# Patient Record
Sex: Female | Born: 1940 | Race: White | Hispanic: No | Smoking: Former smoker
Health system: Southern US, Community
[De-identification: ages and names within clinical notes are randomized; demographics above are authoritative.]

## PROBLEM LIST (undated history)

## (undated) DIAGNOSIS — N390 Urinary tract infection, site not specified: Secondary | ICD-10-CM

## (undated) DIAGNOSIS — C801 Malignant (primary) neoplasm, unspecified: Secondary | ICD-10-CM

## (undated) DIAGNOSIS — E785 Hyperlipidemia, unspecified: Secondary | ICD-10-CM

## (undated) DIAGNOSIS — N3281 Overactive bladder: Secondary | ICD-10-CM

## (undated) DIAGNOSIS — E559 Vitamin D deficiency, unspecified: Secondary | ICD-10-CM

## (undated) DIAGNOSIS — C44311 Basal cell carcinoma of skin of nose: Secondary | ICD-10-CM

## (undated) DIAGNOSIS — I700 Atherosclerosis of aorta: Secondary | ICD-10-CM

## (undated) DIAGNOSIS — G610 Guillain-Barre syndrome: Secondary | ICD-10-CM

## (undated) DIAGNOSIS — M1990 Unspecified osteoarthritis, unspecified site: Secondary | ICD-10-CM

## (undated) DIAGNOSIS — I890 Lymphedema, not elsewhere classified: Secondary | ICD-10-CM

## (undated) DIAGNOSIS — E079 Disorder of thyroid, unspecified: Secondary | ICD-10-CM

## (undated) HISTORY — PX: REPLACEMENT TOTAL HIP W/  RESURFACING IMPLANTS: SUR1222

## (undated) HISTORY — DX: Hyperlipidemia, unspecified: E78.5

## (undated) HISTORY — DX: Unspecified osteoarthritis, unspecified site: M19.90

## (undated) HISTORY — DX: Overactive bladder: N32.81

## (undated) HISTORY — DX: Urinary tract infection, site not specified: N39.0

## (undated) HISTORY — DX: Vitamin D deficiency, unspecified: E55.9

## (undated) HISTORY — DX: Malignant (primary) neoplasm, unspecified: C80.1

## (undated) HISTORY — DX: Basal cell carcinoma of skin of nose: C44.311

## (undated) HISTORY — DX: Disorder of thyroid, unspecified: E07.9

## (undated) HISTORY — DX: Lymphedema, not elsewhere classified: I89.0

## (undated) HISTORY — DX: Guillain-Barre syndrome: G61.0

## (undated) HISTORY — PX: ABDOMINAL HYSTERECTOMY: SHX81

---

## 2006-08-28 HISTORY — PX: CHOLECYSTECTOMY: SHX55

## 2016-09-12 DIAGNOSIS — R918 Other nonspecific abnormal finding of lung field: Secondary | ICD-10-CM | POA: Diagnosis not present

## 2016-09-12 DIAGNOSIS — J984 Other disorders of lung: Secondary | ICD-10-CM | POA: Diagnosis not present

## 2016-09-12 DIAGNOSIS — M19011 Primary osteoarthritis, right shoulder: Secondary | ICD-10-CM | POA: Diagnosis not present

## 2016-09-26 DIAGNOSIS — E039 Hypothyroidism, unspecified: Secondary | ICD-10-CM | POA: Diagnosis not present

## 2016-09-26 DIAGNOSIS — Z0000 Encounter for general adult medical examination without abnormal findings: Secondary | ICD-10-CM | POA: Diagnosis not present

## 2016-09-27 DIAGNOSIS — M7989 Other specified soft tissue disorders: Secondary | ICD-10-CM | POA: Diagnosis not present

## 2016-09-27 DIAGNOSIS — M79605 Pain in left leg: Secondary | ICD-10-CM | POA: Diagnosis not present

## 2016-09-27 DIAGNOSIS — M799 Soft tissue disorder, unspecified: Secondary | ICD-10-CM | POA: Diagnosis not present

## 2016-09-27 DIAGNOSIS — R936 Abnormal findings on diagnostic imaging of limbs: Secondary | ICD-10-CM | POA: Diagnosis not present

## 2016-09-27 DIAGNOSIS — E039 Hypothyroidism, unspecified: Secondary | ICD-10-CM | POA: Diagnosis not present

## 2016-09-27 DIAGNOSIS — I8290 Acute embolism and thrombosis of unspecified vein: Secondary | ICD-10-CM | POA: Diagnosis not present

## 2016-11-10 DIAGNOSIS — E039 Hypothyroidism, unspecified: Secondary | ICD-10-CM | POA: Diagnosis not present

## 2016-11-10 DIAGNOSIS — Z79899 Other long term (current) drug therapy: Secondary | ICD-10-CM | POA: Diagnosis not present

## 2016-11-10 DIAGNOSIS — E785 Hyperlipidemia, unspecified: Secondary | ICD-10-CM | POA: Diagnosis not present

## 2016-11-22 DIAGNOSIS — D2372 Other benign neoplasm of skin of left lower limb, including hip: Secondary | ICD-10-CM | POA: Diagnosis not present

## 2016-11-27 DIAGNOSIS — Z79899 Other long term (current) drug therapy: Secondary | ICD-10-CM | POA: Diagnosis not present

## 2016-11-27 DIAGNOSIS — E785 Hyperlipidemia, unspecified: Secondary | ICD-10-CM | POA: Diagnosis not present

## 2016-11-27 DIAGNOSIS — Z0000 Encounter for general adult medical examination without abnormal findings: Secondary | ICD-10-CM | POA: Diagnosis not present

## 2016-11-27 DIAGNOSIS — E039 Hypothyroidism, unspecified: Secondary | ICD-10-CM | POA: Diagnosis not present

## 2016-12-14 DIAGNOSIS — Z1231 Encounter for screening mammogram for malignant neoplasm of breast: Secondary | ICD-10-CM | POA: Diagnosis not present

## 2016-12-19 DIAGNOSIS — N6001 Solitary cyst of right breast: Secondary | ICD-10-CM | POA: Diagnosis not present

## 2016-12-19 DIAGNOSIS — N6459 Other signs and symptoms in breast: Secondary | ICD-10-CM | POA: Diagnosis not present

## 2016-12-19 DIAGNOSIS — Z1231 Encounter for screening mammogram for malignant neoplasm of breast: Secondary | ICD-10-CM | POA: Diagnosis not present

## 2016-12-19 DIAGNOSIS — R928 Other abnormal and inconclusive findings on diagnostic imaging of breast: Secondary | ICD-10-CM | POA: Diagnosis not present

## 2016-12-20 DIAGNOSIS — Z124 Encounter for screening for malignant neoplasm of cervix: Secondary | ICD-10-CM | POA: Diagnosis not present

## 2016-12-20 DIAGNOSIS — N3281 Overactive bladder: Secondary | ICD-10-CM | POA: Diagnosis not present

## 2017-05-28 DIAGNOSIS — N6001 Solitary cyst of right breast: Secondary | ICD-10-CM | POA: Diagnosis not present

## 2017-05-28 DIAGNOSIS — N6011 Diffuse cystic mastopathy of right breast: Secondary | ICD-10-CM | POA: Diagnosis not present

## 2017-05-28 DIAGNOSIS — R928 Other abnormal and inconclusive findings on diagnostic imaging of breast: Secondary | ICD-10-CM | POA: Diagnosis not present

## 2017-06-11 DIAGNOSIS — E039 Hypothyroidism, unspecified: Secondary | ICD-10-CM | POA: Diagnosis not present

## 2017-06-11 DIAGNOSIS — Z79899 Other long term (current) drug therapy: Secondary | ICD-10-CM | POA: Diagnosis not present

## 2017-07-23 DIAGNOSIS — M2042 Other hammer toe(s) (acquired), left foot: Secondary | ICD-10-CM | POA: Diagnosis not present

## 2017-07-31 DIAGNOSIS — E785 Hyperlipidemia, unspecified: Secondary | ICD-10-CM | POA: Diagnosis not present

## 2017-07-31 DIAGNOSIS — E039 Hypothyroidism, unspecified: Secondary | ICD-10-CM | POA: Diagnosis not present

## 2017-08-06 DIAGNOSIS — E785 Hyperlipidemia, unspecified: Secondary | ICD-10-CM | POA: Diagnosis not present

## 2017-08-06 DIAGNOSIS — E039 Hypothyroidism, unspecified: Secondary | ICD-10-CM | POA: Diagnosis not present

## 2017-08-28 DIAGNOSIS — C801 Malignant (primary) neoplasm, unspecified: Secondary | ICD-10-CM

## 2017-08-28 HISTORY — DX: Malignant (primary) neoplasm, unspecified: C80.1

## 2017-09-05 DIAGNOSIS — Z780 Asymptomatic menopausal state: Secondary | ICD-10-CM | POA: Diagnosis not present

## 2017-10-31 DIAGNOSIS — R202 Paresthesia of skin: Secondary | ICD-10-CM | POA: Diagnosis not present

## 2017-10-31 DIAGNOSIS — R531 Weakness: Secondary | ICD-10-CM | POA: Diagnosis not present

## 2017-11-09 DIAGNOSIS — M2042 Other hammer toe(s) (acquired), left foot: Secondary | ICD-10-CM | POA: Diagnosis not present

## 2017-11-12 DIAGNOSIS — R531 Weakness: Secondary | ICD-10-CM | POA: Diagnosis not present

## 2017-11-12 DIAGNOSIS — M47816 Spondylosis without myelopathy or radiculopathy, lumbar region: Secondary | ICD-10-CM | POA: Diagnosis not present

## 2017-11-12 DIAGNOSIS — R202 Paresthesia of skin: Secondary | ICD-10-CM | POA: Diagnosis not present

## 2017-11-12 DIAGNOSIS — R1900 Intra-abdominal and pelvic swelling, mass and lump, unspecified site: Secondary | ICD-10-CM | POA: Diagnosis not present

## 2017-11-12 DIAGNOSIS — M4316 Spondylolisthesis, lumbar region: Secondary | ICD-10-CM | POA: Diagnosis not present

## 2017-11-12 DIAGNOSIS — M4696 Unspecified inflammatory spondylopathy, lumbar region: Secondary | ICD-10-CM | POA: Diagnosis not present

## 2017-11-24 DIAGNOSIS — Z79899 Other long term (current) drug therapy: Secondary | ICD-10-CM | POA: Diagnosis not present

## 2017-11-24 DIAGNOSIS — E039 Hypothyroidism, unspecified: Secondary | ICD-10-CM | POA: Diagnosis not present

## 2017-12-12 DIAGNOSIS — R202 Paresthesia of skin: Secondary | ICD-10-CM | POA: Diagnosis not present

## 2017-12-12 DIAGNOSIS — R1900 Intra-abdominal and pelvic swelling, mass and lump, unspecified site: Secondary | ICD-10-CM | POA: Diagnosis not present

## 2017-12-20 DIAGNOSIS — R9389 Abnormal findings on diagnostic imaging of other specified body structures: Secondary | ICD-10-CM | POA: Diagnosis not present

## 2017-12-20 DIAGNOSIS — R1900 Intra-abdominal and pelvic swelling, mass and lump, unspecified site: Secondary | ICD-10-CM | POA: Diagnosis not present

## 2017-12-20 DIAGNOSIS — N854 Malposition of uterus: Secondary | ICD-10-CM | POA: Diagnosis not present

## 2017-12-24 DIAGNOSIS — M2042 Other hammer toe(s) (acquired), left foot: Secondary | ICD-10-CM | POA: Diagnosis not present

## 2017-12-26 DIAGNOSIS — Z1151 Encounter for screening for human papillomavirus (HPV): Secondary | ICD-10-CM | POA: Diagnosis not present

## 2017-12-26 DIAGNOSIS — N83209 Unspecified ovarian cyst, unspecified side: Secondary | ICD-10-CM | POA: Diagnosis not present

## 2017-12-26 DIAGNOSIS — N83201 Unspecified ovarian cyst, right side: Secondary | ICD-10-CM | POA: Diagnosis not present

## 2017-12-26 DIAGNOSIS — N840 Polyp of corpus uteri: Secondary | ICD-10-CM | POA: Diagnosis not present

## 2017-12-26 DIAGNOSIS — N8500 Endometrial hyperplasia, unspecified: Secondary | ICD-10-CM | POA: Diagnosis not present

## 2017-12-26 DIAGNOSIS — Z124 Encounter for screening for malignant neoplasm of cervix: Secondary | ICD-10-CM | POA: Diagnosis not present

## 2018-01-02 DIAGNOSIS — N6011 Diffuse cystic mastopathy of right breast: Secondary | ICD-10-CM | POA: Diagnosis not present

## 2018-01-02 DIAGNOSIS — N6001 Solitary cyst of right breast: Secondary | ICD-10-CM | POA: Diagnosis not present

## 2018-01-02 DIAGNOSIS — Z1231 Encounter for screening mammogram for malignant neoplasm of breast: Secondary | ICD-10-CM | POA: Diagnosis not present

## 2018-02-05 DIAGNOSIS — R9389 Abnormal findings on diagnostic imaging of other specified body structures: Secondary | ICD-10-CM | POA: Diagnosis not present

## 2018-02-05 DIAGNOSIS — N949 Unspecified condition associated with female genital organs and menstrual cycle: Secondary | ICD-10-CM | POA: Diagnosis not present

## 2018-02-05 DIAGNOSIS — N281 Cyst of kidney, acquired: Secondary | ICD-10-CM | POA: Diagnosis not present

## 2018-02-07 DIAGNOSIS — R0982 Postnasal drip: Secondary | ICD-10-CM | POA: Diagnosis not present

## 2018-02-07 DIAGNOSIS — J029 Acute pharyngitis, unspecified: Secondary | ICD-10-CM | POA: Diagnosis not present

## 2018-02-09 DIAGNOSIS — N949 Unspecified condition associated with female genital organs and menstrual cycle: Secondary | ICD-10-CM | POA: Diagnosis not present

## 2018-02-09 DIAGNOSIS — D270 Benign neoplasm of right ovary: Secondary | ICD-10-CM | POA: Diagnosis not present

## 2018-02-09 DIAGNOSIS — R9389 Abnormal findings on diagnostic imaging of other specified body structures: Secondary | ICD-10-CM | POA: Diagnosis not present

## 2018-02-09 DIAGNOSIS — Z9049 Acquired absence of other specified parts of digestive tract: Secondary | ICD-10-CM | POA: Diagnosis not present

## 2018-02-09 DIAGNOSIS — N281 Cyst of kidney, acquired: Secondary | ICD-10-CM | POA: Diagnosis not present

## 2018-02-09 DIAGNOSIS — K30 Functional dyspepsia: Secondary | ICD-10-CM | POA: Diagnosis not present

## 2018-02-12 DIAGNOSIS — R9389 Abnormal findings on diagnostic imaging of other specified body structures: Secondary | ICD-10-CM | POA: Diagnosis not present

## 2018-02-12 DIAGNOSIS — R1900 Intra-abdominal and pelvic swelling, mass and lump, unspecified site: Secondary | ICD-10-CM | POA: Diagnosis not present

## 2018-02-13 DIAGNOSIS — R1900 Intra-abdominal and pelvic swelling, mass and lump, unspecified site: Secondary | ICD-10-CM | POA: Diagnosis not present

## 2018-02-13 DIAGNOSIS — E0500 Thyrotoxicosis with diffuse goiter without thyrotoxic crisis or storm: Secondary | ICD-10-CM | POA: Diagnosis not present

## 2018-02-13 DIAGNOSIS — R9389 Abnormal findings on diagnostic imaging of other specified body structures: Secondary | ICD-10-CM | POA: Diagnosis not present

## 2018-02-15 DIAGNOSIS — C7962 Secondary malignant neoplasm of left ovary: Secondary | ICD-10-CM | POA: Diagnosis not present

## 2018-02-15 DIAGNOSIS — E0500 Thyrotoxicosis with diffuse goiter without thyrotoxic crisis or storm: Secondary | ICD-10-CM | POA: Diagnosis not present

## 2018-02-15 DIAGNOSIS — C5702 Malignant neoplasm of left fallopian tube: Secondary | ICD-10-CM | POA: Diagnosis not present

## 2018-02-15 DIAGNOSIS — D251 Intramural leiomyoma of uterus: Secondary | ICD-10-CM | POA: Diagnosis not present

## 2018-02-15 DIAGNOSIS — N840 Polyp of corpus uteri: Secondary | ICD-10-CM | POA: Diagnosis not present

## 2018-02-15 DIAGNOSIS — N800 Endometriosis of uterus: Secondary | ICD-10-CM | POA: Diagnosis not present

## 2018-02-15 DIAGNOSIS — K660 Peritoneal adhesions (postprocedural) (postinfection): Secondary | ICD-10-CM | POA: Diagnosis not present

## 2018-02-15 DIAGNOSIS — C562 Malignant neoplasm of left ovary: Secondary | ICD-10-CM | POA: Diagnosis not present

## 2018-02-15 DIAGNOSIS — C7961 Secondary malignant neoplasm of right ovary: Secondary | ICD-10-CM | POA: Diagnosis not present

## 2018-02-15 DIAGNOSIS — C561 Malignant neoplasm of right ovary: Secondary | ICD-10-CM | POA: Diagnosis not present

## 2018-02-15 DIAGNOSIS — E785 Hyperlipidemia, unspecified: Secondary | ICD-10-CM | POA: Diagnosis not present

## 2018-02-15 HISTORY — PX: TOTAL ABDOMINAL HYSTERECTOMY W/ BILATERAL SALPINGOOPHORECTOMY: SHX83

## 2018-02-15 HISTORY — PX: LAPAROSCOPIC VAGINAL HYSTERECTOMY WITH SALPINGO OOPHORECTOMY: SHX6681

## 2018-02-16 DIAGNOSIS — E0500 Thyrotoxicosis with diffuse goiter without thyrotoxic crisis or storm: Secondary | ICD-10-CM | POA: Diagnosis not present

## 2018-02-16 DIAGNOSIS — K660 Peritoneal adhesions (postprocedural) (postinfection): Secondary | ICD-10-CM | POA: Diagnosis not present

## 2018-02-16 DIAGNOSIS — N840 Polyp of corpus uteri: Secondary | ICD-10-CM | POA: Diagnosis not present

## 2018-02-16 DIAGNOSIS — E785 Hyperlipidemia, unspecified: Secondary | ICD-10-CM | POA: Diagnosis not present

## 2018-02-22 ENCOUNTER — Ambulatory Visit (INDEPENDENT_AMBULATORY_CARE_PROVIDER_SITE_OTHER): Payer: Medicare Other | Admitting: General Surgery

## 2018-02-22 ENCOUNTER — Other Ambulatory Visit: Payer: Self-pay | Admitting: *Deleted

## 2018-02-22 ENCOUNTER — Encounter: Payer: Self-pay | Admitting: General Surgery

## 2018-02-22 VITALS — BP 110/66 | HR 78 | Temp 98.1°F | Resp 14 | Ht 66.0 in | Wt 180.0 lb

## 2018-02-22 DIAGNOSIS — C561 Malignant neoplasm of right ovary: Secondary | ICD-10-CM | POA: Diagnosis not present

## 2018-02-22 DIAGNOSIS — C569 Malignant neoplasm of unspecified ovary: Secondary | ICD-10-CM

## 2018-02-22 NOTE — Patient Instructions (Addendum)
Implanted Port Insertion Implanted port insertion is a procedure to put in a port and catheter. The port is a device with an injectable disk that can be accessed by your health care provider. The port is connected to a vein in the chest or neck by a small flexible tube (catheter). There are different types of ports. The implanted port may be used as a long-term IV access for:  Medicines, such as chemotherapy.  Fluids.  Liquid nutrition, such as total parenteral nutrition (TPN).  Blood samples.  Having a port means that your health care provider will not need to use the veins in your arms for these procedures. Tell a health care provider about:  Any allergies you have.  All medicines you are taking, especially blood thinners, as well as any vitamins, herbs, eye drops, creams, over-the-counter medicines, and steroids.  Any problems you or family members have had with anesthetic medicines.  Any blood disorders you have.  Any surgeries you have had.  Any medical conditions you have, including diabetes or kidney problems.  Whether you are pregnant or may be pregnant. What are the risks? Generally, this is a safe procedure. However, problems may occur, including:  Allergic reactions to medicines or dyes.  Damage to other structures or organs.  Infection.  Damage to the blood vessel, bruising, or bleeding at the puncture site.  Blood clot.  Breakdown of the skin over the port.  A collection of air in the chest that can cause one of the lungs to collapse (pneumothorax). This is rare.  What happens before the procedure? Staying hydrated Follow instructions from your health care provider about hydration, which may include:  Up to 2 hours before the procedure - you may continue to drink clear liquids, such as water, clear fruit juice, black coffee, and plain tea.  Eating and drinking restrictions  Follow instructions from your health care provider about eating and drinking,  which may include: ? 8 hours before the procedure - stop eating heavy meals or foods such as meat, fried foods, or fatty foods. ? 6 hours before the procedure - stop eating light meals or foods, such as toast or cereal. ? 6 hours before the procedure - stop drinking milk or drinks that contain milk. ? 2 hours before the procedure - stop drinking clear liquids. Medicines  Ask your health care provider about: ? Changing or stopping your regular medicines. This is especially important if you are taking diabetes medicines or blood thinners. ? Taking medicines such as aspirin and ibuprofen. These medicines can thin your blood. Do not take these medicines before your procedure if your health care provider instructs you not to.  You may be given antibiotic medicine to help prevent infection. General instructions  Plan to have someone take you home from the hospital or clinic.  If you will be going home right after the procedure, plan to have someone with you for 24 hours.  You may have blood tests.  You may be asked to shower with a germ-killing soap. What happens during the procedure?  To lower your risk of infection: ? Your health care team will wash or sanitize their hands. ? Your skin will be washed with soap. ? Hair may be removed from the surgical area.  An IV tube will be inserted into one of your veins.  You will be given one or more of the following: ? A medicine to help you relax (sedative). ? A medicine to numb the area (local anesthetic).    Two small cuts (incisions) will be made to insert the port. ? One incision will be made in your neck to get access to the vein where the catheter will lie. ? The other incision will be made in the upper chest. This is where the port will lie.  The procedure may be done using continuous X-ray (fluoroscopy) or other imaging tools for guidance.  The port and catheter will be placed. There may be a small, raised area where the port  is.  The port will be flushed with a salt solution (saline), and blood will be drawn to make sure that it is working correctly.  The incisions will be closed.  Bandages (dressings) may be placed over the incisions. The procedure may vary among health care providers and hospitals. What happens after the procedure?  Your blood pressure, heart rate, breathing rate, and blood oxygen level will be monitored until the medicines you were given have worn off.  Do not drive for 24 hours if you were given a sedative.  You will be given a manufacturer's information card for the type of port that you have. Keep this with you.  Your port will need to be flushed and checked as told by your health care provider, usually every few weeks.  A chest X-ray will be done to: ? Check the placement of the port. ? Make sure there is no injury to your lung. Summary  Implanted port insertion is a procedure to put in a port and catheter.  The implanted port is used as a long-term IV access.  The port will need to be flushed and checked as told by your health care provider, usually every few weeks.  Keep your manufacturer's information card with you at all times. This information is not intended to replace advice given to you by your health care provider. Make sure you discuss any questions you have with your health care provider. Document Released: 06/04/2013 Document Revised: 07/05/2016 Document Reviewed: 07/05/2016 Elsevier Interactive Patient Education  2017 [employer].   The patient is scheduled for surgery at [facility] on 02/25/18. She will pre admit at the hospital that morning and report to the registration desk at 6:30 am. The patient is aware of date, time, and instructions.

## 2018-02-22 NOTE — Progress Notes (Signed)
Patient ID: Melissa Anderson, female   DOB: Feb 12, 1941, 77 y.o.   MRN: [id number]  Chief Complaint  Patient presents with  . Other    port placement    HPI Melissa Anderson is a 77 y.o. female here to discuss having a port placement done. She is here today with her friend Immunologist. Her daughter is Dr Mike Gip at [facility].  HPI  Past Medical History:  Diagnosis Date  . Cancer [facility]) 2019   right sided ovarian cancer  . Hyperlipidemia   . Thyroid disease     Past Surgical History:  Procedure Laterality Date  . CHOLECYSTECTOMY  2008  . TOTAL ABDOMINAL HYSTERECTOMY W/ BILATERAL SALPINGOOPHORECTOMY     lymph nodes removed    History reviewed. No pertinent family history.  Social History Social History   Tobacco Use  . Smoking status: Former Smoker    Years: 25.00    Types: Cigarettes  . Smokeless tobacco: Never Used  Substance Use Topics  . Alcohol use: Never    Frequency: Never  . Drug use: Never    Allergies  Allergen Reactions  . Epinephrine Other (See Comments)    Funny feeling, heart racing     Current Outpatient Medications  Medication Sig Dispense Refill  . rosuvastatin (CRESTOR) 10 MG tablet Take 1 tablet by mouth daily.    Marland Kitchen SYNTHROID 175 MCG tablet Take 1 tablet by mouth daily.    . VESICARE 5 MG tablet Take 1 tablet by mouth daily.     No current facility-administered medications for this visit.     Review of Systems Review of Systems  Constitutional: Negative.   Respiratory: Negative.   Cardiovascular: Negative.     Blood pressure 110/66, pulse 78, temperature 98.1 F (36.7 C), resp. rate 14, height 5\' 6"  (1.676 m), weight 180 lb (81.6 kg).  Physical Exam Physical Exam  Constitutional: She appears well-developed and well-nourished.  Cardiovascular: Normal rate and regular rhythm.  Pulmonary/Chest: Effort normal and breath sounds normal.  Lymphadenopathy:    She has no cervical adenopathy.       Left: No supraclavicular adenopathy  present.    Data Reviewed February 16, 2018 CBC completed at [facility] showed a hemoglobin of 11.8 with an MCV of 97, white blood cell count of 13,000, platelet count of 243,000. Basic metabolic panel of the same date was normal.  Creatinine 0.8.  Estimated GFR 71.  Comprehensive metabolic panel February 05, 3353 was normal.  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    Plan    The risks associated with central venous access including arterial, pulmonary and venous injury were reviewed. The possible need for additional treatment if pulmonary injury occurs (chest tube placement) was discussed.    The patient is scheduled for surgery at [facility] on 02/25/18. She will pre admit at the hospital that morning and report to the registration desk at 6:30 am. The patient is aware of date, time, and instructions.  Documented by Lesly Rubenstein LPN  HPI, Physical Exam, Assessment and Plan have been scribed under the direction and in the presence of Robert Bellow, MD  Concepcion Living, LPN  I have completed the exam and reviewed the  above documentation for accuracy and completeness.  I agree with the above.  Haematologist has been used and any errors in dictation or transcription are unintentional.  Hervey Ard, M.D., F.A.C.S.  Concepcion Living M 02/22/2018, 12:11 PM

## 2018-02-25 ENCOUNTER — Ambulatory Visit: Payer: Medicare Other | Admitting: Anesthesiology

## 2018-02-25 ENCOUNTER — Ambulatory Visit: Payer: Medicare Other

## 2018-02-25 ENCOUNTER — Other Ambulatory Visit: Payer: Self-pay

## 2018-02-25 ENCOUNTER — Encounter: Payer: Self-pay | Admitting: Oncology

## 2018-02-25 ENCOUNTER — Ambulatory Visit
Admission: RE | Admit: 2018-02-25 | Discharge: 2018-02-25 | Disposition: A | Discharge status: Home / Self Care | Payer: Medicare Other | Source: Ambulatory Visit | Attending: General Surgery | Admitting: General Surgery

## 2018-02-25 ENCOUNTER — Inpatient Hospital Stay: Payer: Medicare Other | Attending: Oncology | Admitting: Oncology

## 2018-02-25 ENCOUNTER — Encounter: Payer: Self-pay | Admitting: *Deleted

## 2018-02-25 ENCOUNTER — Encounter
Admission: RE | Disposition: A | Discharge status: Home / Self Care | Payer: Self-pay | Source: Ambulatory Visit | Attending: General Surgery

## 2018-02-25 VITALS — BP 104/62 | HR 71 | Temp 95.9°F | Resp 18 | Ht 66.14 in | Wt 180.9 lb

## 2018-02-25 DIAGNOSIS — E785 Hyperlipidemia, unspecified: Secondary | ICD-10-CM | POA: Insufficient documentation

## 2018-02-25 DIAGNOSIS — Z90722 Acquired absence of ovaries, bilateral: Secondary | ICD-10-CM | POA: Insufficient documentation

## 2018-02-25 DIAGNOSIS — Z452 Encounter for adjustment and management of vascular access device: Secondary | ICD-10-CM | POA: Insufficient documentation

## 2018-02-25 DIAGNOSIS — E039 Hypothyroidism, unspecified: Secondary | ICD-10-CM | POA: Diagnosis not present

## 2018-02-25 DIAGNOSIS — Z5189 Encounter for other specified aftercare: Secondary | ICD-10-CM | POA: Insufficient documentation

## 2018-02-25 DIAGNOSIS — E0500 Thyrotoxicosis with diffuse goiter without thyrotoxic crisis or storm: Secondary | ICD-10-CM | POA: Diagnosis not present

## 2018-02-25 DIAGNOSIS — C5702 Malignant neoplasm of left fallopian tube: Secondary | ICD-10-CM | POA: Diagnosis not present

## 2018-02-25 DIAGNOSIS — C562 Malignant neoplasm of left ovary: Secondary | ICD-10-CM | POA: Insufficient documentation

## 2018-02-25 DIAGNOSIS — Z7189 Other specified counseling: Secondary | ICD-10-CM | POA: Insufficient documentation

## 2018-02-25 DIAGNOSIS — Z87891 Personal history of nicotine dependence: Secondary | ICD-10-CM | POA: Insufficient documentation

## 2018-02-25 DIAGNOSIS — Z79890 Hormone replacement therapy (postmenopausal): Secondary | ICD-10-CM | POA: Insufficient documentation

## 2018-02-25 DIAGNOSIS — C561 Malignant neoplasm of right ovary: Secondary | ICD-10-CM | POA: Diagnosis not present

## 2018-02-25 DIAGNOSIS — Z95828 Presence of other vascular implants and grafts: Secondary | ICD-10-CM

## 2018-02-25 DIAGNOSIS — C5700 Malignant neoplasm of unspecified fallopian tube: Secondary | ICD-10-CM | POA: Diagnosis not present

## 2018-02-25 DIAGNOSIS — Z5111 Encounter for antineoplastic chemotherapy: Secondary | ICD-10-CM | POA: Insufficient documentation

## 2018-02-25 DIAGNOSIS — Z90710 Acquired absence of both cervix and uterus: Secondary | ICD-10-CM | POA: Diagnosis not present

## 2018-02-25 HISTORY — PX: PORTACATH PLACEMENT: SHX2246

## 2018-02-25 SURGERY — INSERTION, TUNNELED CENTRAL VENOUS DEVICE, WITH PORT
Anesthesia: General | Site: Chest | Laterality: Left | Wound class: Clean

## 2018-02-25 MED ORDER — FENTANYL CITRATE (PF) 100 MCG/2ML IJ SOLN: 25.0000 ug | INTRAMUSCULAR | Status: DC | PRN

## 2018-02-25 MED ORDER — SODIUM CHLORIDE 0.9 % IJ SOLN
INTRAMUSCULAR | Status: DC | PRN
  Administered 2018-02-25: 30 mL via INTRAVENOUS

## 2018-02-25 MED ORDER — ACETAMINOPHEN 10 MG/ML IV SOLN
INTRAVENOUS | Status: AC
  Filled 2018-02-25: qty 100

## 2018-02-25 MED ORDER — ONDANSETRON HCL 4 MG/2ML IJ SOLN
INTRAMUSCULAR | Status: DC | PRN
  Administered 2018-02-25: 4 mg via INTRAVENOUS

## 2018-02-25 MED ORDER — LIDOCAINE-PRILOCAINE 2.5-2.5 % EX CREA: TOPICAL_CREAM | CUTANEOUS | 3 refills | Status: DC

## 2018-02-25 MED ORDER — LIDOCAINE HCL 1 % IJ SOLN
INTRAMUSCULAR | Status: DC | PRN
  Administered 2018-02-25: 10 mL

## 2018-02-25 MED ORDER — PROPOFOL 10 MG/ML IV BOLUS
INTRAVENOUS | Status: DC | PRN
  Administered 2018-02-25: 80 mg via INTRAVENOUS

## 2018-02-25 MED ORDER — PROCHLORPERAZINE MALEATE 10 MG PO TABS: 10.0000 mg | ORAL_TABLET | Freq: Four times a day (QID) | ORAL | 1 refills | Status: DC | PRN

## 2018-02-25 MED ORDER — LACTATED RINGERS IV SOLN
INTRAVENOUS | Status: DC
  Administered 2018-02-25: 08:00:00 via INTRAVENOUS

## 2018-02-25 MED ORDER — ONDANSETRON HCL 4 MG/2ML IJ SOLN: 4.0000 mg | Freq: Once | INTRAMUSCULAR | Status: DC | PRN

## 2018-02-25 MED ORDER — LIDOCAINE HCL (PF) 1 % IJ SOLN
INTRAMUSCULAR | Status: AC
  Filled 2018-02-25: qty 30

## 2018-02-25 MED ORDER — LACTATED RINGERS IV SOLN
INTRAVENOUS | Status: DC | PRN
  Administered 2018-02-25: 09:00:00 via INTRAVENOUS

## 2018-02-25 MED ORDER — DEXAMETHASONE SODIUM PHOSPHATE 10 MG/ML IJ SOLN
INTRAMUSCULAR | Status: DC | PRN
  Administered 2018-02-25: 10 mg via INTRAVENOUS

## 2018-02-25 MED ORDER — ACETAMINOPHEN 10 MG/ML IV SOLN
INTRAVENOUS | Status: DC | PRN
  Administered 2018-02-25: 1000 mg via INTRAVENOUS

## 2018-02-25 MED ORDER — MIDAZOLAM HCL 2 MG/2ML IJ SOLN
INTRAMUSCULAR | Status: DC | PRN
  Administered 2018-02-25: 2 mg via INTRAVENOUS

## 2018-02-25 MED ORDER — LIDOCAINE HCL (CARDIAC) PF 100 MG/5ML IV SOSY
PREFILLED_SYRINGE | INTRAVENOUS | Status: DC | PRN
  Administered 2018-02-25: 80 mg via INTRAVENOUS

## 2018-02-25 MED ORDER — LORAZEPAM 0.5 MG PO TABS: 0.5000 mg | ORAL_TABLET | Freq: Four times a day (QID) | ORAL | 0 refills | Status: DC | PRN

## 2018-02-25 MED ORDER — ONDANSETRON HCL 8 MG PO TABS: 8.0000 mg | ORAL_TABLET | Freq: Two times a day (BID) | ORAL | 1 refills | Status: DC | PRN

## 2018-02-25 MED ORDER — DEXAMETHASONE 4 MG PO TABS: 8.0000 mg | ORAL_TABLET | Freq: Every day | ORAL | 1 refills | Status: DC

## 2018-02-25 MED ORDER — FENTANYL CITRATE (PF) 100 MCG/2ML IJ SOLN
INTRAMUSCULAR | Status: DC | PRN
  Administered 2018-02-25 (×2): 50 ug via INTRAVENOUS

## 2018-02-25 MED ORDER — FENTANYL CITRATE (PF) 100 MCG/2ML IJ SOLN
INTRAMUSCULAR | Status: AC
  Filled 2018-02-25: qty 2

## 2018-02-25 MED ORDER — CEFAZOLIN SODIUM-DEXTROSE 2-4 GM/100ML-% IV SOLN
2.0000 g | INTRAVENOUS | Status: AC
  Administered 2018-02-25: 2 g via INTRAVENOUS

## 2018-02-25 MED ORDER — HYDROCODONE-ACETAMINOPHEN 5-325 MG PO TABS: 1.0000 | ORAL_TABLET | ORAL | 0 refills | Status: DC | PRN

## 2018-02-25 MED ORDER — SEVOFLURANE IN SOLN
RESPIRATORY_TRACT | Status: AC
  Filled 2018-02-25: qty 250

## 2018-02-25 MED ORDER — MIDAZOLAM HCL 2 MG/2ML IJ SOLN
INTRAMUSCULAR | Status: AC
  Filled 2018-02-25: qty 2

## 2018-02-25 MED ORDER — CEFAZOLIN SODIUM-DEXTROSE 2-4 GM/100ML-% IV SOLN
INTRAVENOUS | Status: AC
  Filled 2018-02-25: qty 100

## 2018-02-25 SURGICAL SUPPLY — 29 items
BLADE SURG 15 STRL SS SAFETY (BLADE) ×2 IMPLANT
CHLORAPREP W/TINT 26ML (MISCELLANEOUS) ×2 IMPLANT
COVER LIGHT HANDLE STERIS (MISCELLANEOUS) ×4 IMPLANT
DECANTER SPIKE VIAL GLASS SM (MISCELLANEOUS) ×4 IMPLANT
DRAPE C-ARM XRAY 36X54 (DRAPES) ×2 IMPLANT
DRAPE LAPAROTOMY TRNSV 106X77 (MISCELLANEOUS) ×2 IMPLANT
DRSG TEGADERM 2-3/8X2-3/4 SM (GAUZE/BANDAGES/DRESSINGS) ×2 IMPLANT
DRSG TEGADERM 4X4.75 (GAUZE/BANDAGES/DRESSINGS) ×2 IMPLANT
DRSG TELFA 3X8 NADH (GAUZE/BANDAGES/DRESSINGS) ×2 IMPLANT
DRSG TELFA 4X3 1S NADH ST (GAUZE/BANDAGES/DRESSINGS) ×2 IMPLANT
ELECT REM PT RETURN 9FT ADLT (ELECTROSURGICAL) ×2
ELECTRODE REM PT RTRN 9FT ADLT (ELECTROSURGICAL) ×1 IMPLANT
GLOVE BIO SURGEON STRL SZ7.5 (GLOVE) ×2 IMPLANT
GLOVE INDICATOR 8.0 STRL GRN (GLOVE) ×2 IMPLANT
GOWN STRL REUS W/ TWL LRG LVL3 (GOWN DISPOSABLE) ×2 IMPLANT
GOWN STRL REUS W/TWL LRG LVL3 (GOWN DISPOSABLE) ×2
KIT PORT POWER 8FR ISP CVUE (Port) ×2 IMPLANT
KIT TURNOVER KIT A (KITS) ×2 IMPLANT
LABEL OR SOLS (LABEL) ×2 IMPLANT
NS IRRIG 500ML POUR BTL (IV SOLUTION) ×2 IMPLANT
PACK PORT-A-CATH (MISCELLANEOUS) ×2 IMPLANT
STRIP CLOSURE SKIN 1/2X4 (GAUZE/BANDAGES/DRESSINGS) ×2 IMPLANT
SUT PROLENE 3 0 SH DA (SUTURE) ×2 IMPLANT
SUT VIC AB 3-0 SH 27 (SUTURE) ×1
SUT VIC AB 3-0 SH 27X BRD (SUTURE) ×1 IMPLANT
SUT VIC AB 4-0 FS2 27 (SUTURE) ×2 IMPLANT
SWABSTK COMLB BENZOIN TINCTURE (MISCELLANEOUS) ×2 IMPLANT
SYR 10ML LL (SYRINGE) ×2 IMPLANT
SYR 10ML SLIP (SYRINGE) ×2 IMPLANT

## 2018-02-25 NOTE — Patient Instructions (Signed)
Paclitaxel injection What is this medicine? PACLITAXEL (PAK li TAX el) is a chemotherapy drug. It targets fast dividing cells, like cancer cells, and causes these cells to die. This medicine is used to treat ovarian cancer, breast cancer, and other cancers. This medicine may be used for other purposes; ask your health care provider or pharmacist if you have questions. COMMON BRAND NAME(S): Onxol, Taxol What should I tell my health care provider before I take this medicine? They need to know if you have any of these conditions: -blood disorders -irregular heartbeat -infection (especially a virus infection such as chickenpox, cold sores, or herpes) -liver disease -previous or ongoing radiation therapy -an unusual or allergic reaction to paclitaxel, alcohol, polyoxyethylated castor oil, other chemotherapy agents, other medicines, foods, dyes, or preservatives -pregnant or trying to get pregnant -breast-feeding How should I use this medicine? This drug is given as an infusion into a vein. It is administered in a hospital or clinic by a specially trained health care professional. Talk to your pediatrician regarding the use of this medicine in children. Special care may be needed. Overdosage: If you think you have taken too much of this medicine contact a poison control center or emergency room at once. NOTE: This medicine is only for you. Do not share this medicine with others. What if I miss a dose? It is important not to miss your dose. Call your doctor or health care professional if you are unable to keep an appointment. What may interact with this medicine? Do not take this medicine with any of the following medications: -disulfiram -metronidazole This medicine may also interact with the following medications: -cyclosporine -diazepam -ketoconazole -medicines to increase blood counts like filgrastim, pegfilgrastim, sargramostim -other chemotherapy drugs like cisplatin, doxorubicin,  epirubicin, etoposide, teniposide, vincristine -quinidine -testosterone -vaccines -verapamil Talk to your doctor or health care professional before taking any of these medicines: -acetaminophen -aspirin -ibuprofen -ketoprofen -naproxen This list may not describe all possible interactions. Give your health care provider a list of all the medicines, herbs, non-prescription drugs, or dietary supplements you use. Also tell them if you smoke, drink alcohol, or use illegal drugs. Some items may interact with your medicine. What should I watch for while using this medicine? Your condition will be monitored carefully while you are receiving this medicine. You will need important blood work done while you are taking this medicine. This medicine can cause serious allergic reactions. To reduce your risk you will need to take other medicine(s) before treatment with this medicine. If you experience allergic reactions like skin rash, itching or hives, swelling of the face, lips, or tongue, tell your doctor or health care professional right away. In some cases, you may be given additional medicines to help with side effects. Follow all directions for their use. This drug may make you feel generally unwell. This is not uncommon, as chemotherapy can affect healthy cells as well as cancer cells. Report any side effects. Continue your course of treatment even though you feel ill unless your doctor tells you to stop. Call your doctor or health care professional for advice if you get a fever, chills or sore throat, or other symptoms of a cold or flu. Do not treat yourself. This drug decreases your body's ability to fight infections. Try to avoid being around people who are sick. This medicine may increase your risk to bruise or bleed. Call your doctor or health care professional if you notice any unusual bleeding. Be careful brushing and flossing your teeth or   using a toothpick because you may get an infection or  bleed more easily. If you have any dental work done, tell your dentist you are receiving this medicine. Avoid taking products that contain aspirin, acetaminophen, ibuprofen, naproxen, or ketoprofen unless instructed by your doctor. These medicines may hide a fever. Do not become pregnant while taking this medicine. Women should inform their doctor if they wish to become pregnant or think they might be pregnant. There is a potential for serious side effects to an unborn child. Talk to your health care professional or pharmacist for more information. Do not breast-feed an infant while taking this medicine. Men are advised not to father a child while receiving this medicine. This product may contain alcohol. Ask your pharmacist or healthcare provider if this medicine contains alcohol. Be sure to tell all healthcare providers you are taking this medicine. Certain medicines, like metronidazole and disulfiram, can cause an unpleasant reaction when taken with alcohol. The reaction includes flushing, headache, nausea, vomiting, sweating, and increased thirst. The reaction can last from 30 minutes to several hours. What side effects may I notice from receiving this medicine? Side effects that you should report to your doctor or health care professional as soon as possible: -allergic reactions like skin rash, itching or hives, swelling of the face, lips, or tongue -low blood counts - This drug may decrease the number of white blood cells, red blood cells and platelets. You may be at increased risk for infections and bleeding. -signs of infection - fever or chills, cough, sore throat, pain or difficulty passing urine -signs of decreased platelets or bleeding - bruising, pinpoint red spots on the skin, black, tarry stools, nosebleeds -signs of decreased red blood cells - unusually weak or tired, fainting spells, lightheadedness -breathing problems -chest pain -high or low blood pressure -mouth sores -nausea and  vomiting -pain, swelling, redness or irritation at the injection site -pain, tingling, numbness in the hands or feet -slow or irregular heartbeat -swelling of the ankle, feet, hands Side effects that usually do not require medical attention (report to your doctor or health care professional if they continue or are bothersome): -bone pain -complete hair loss including hair on your head, underarms, pubic hair, eyebrows, and eyelashes -changes in the color of fingernails -diarrhea -loosening of the fingernails -loss of appetite -muscle or joint pain -red flush to skin -sweating This list may not describe all possible side effects. Call your doctor for medical advice about side effects. You may report side effects to FDA at 1-800-FDA-1088. Where should I keep my medicine? This drug is given in a hospital or clinic and will not be stored at home. NOTE: This sheet is a summary. It may not cover all possible information. If you have questions about this medicine, talk to your doctor, pharmacist, or health care provider.  2018 [employer] Standard (2015-06-15 19:58:00) Carboplatin injection What is this medicine? CARBOPLATIN (KAR boe pla tin) is a chemotherapy drug. It targets fast dividing cells, like cancer cells, and causes these cells to die. This medicine is used to treat ovarian cancer and many other cancers. This medicine may be used for other purposes; ask your health care provider or pharmacist if you have questions. COMMON BRAND NAME(S): Paraplatin What should I tell my health care provider before I take this medicine? They need to know if you have any of these conditions: -blood disorders -hearing problems -kidney disease -recent or ongoing radiation therapy -an unusual or allergic reaction to carboplatin, cisplatin, other chemotherapy, other   medicines, foods, dyes, or preservatives -pregnant or trying to get pregnant -breast-feeding How should I use this medicine? This drug  is usually given as an infusion into a vein. It is administered in a hospital or clinic by a specially trained health care professional. Talk to your pediatrician regarding the use of this medicine in children. Special care may be needed. Overdosage: If you think you have taken too much of this medicine contact a poison control center or emergency room at once. NOTE: This medicine is only for you. Do not share this medicine with others. What if I miss a dose? It is important not to miss a dose. Call your doctor or health care professional if you are unable to keep an appointment. What may interact with this medicine? -medicines for seizures -medicines to increase blood counts like filgrastim, pegfilgrastim, sargramostim -some antibiotics like amikacin, gentamicin, neomycin, streptomycin, tobramycin -vaccines Talk to your doctor or health care professional before taking any of these medicines: -acetaminophen -aspirin -ibuprofen -ketoprofen -naproxen This list may not describe all possible interactions. Give your health care provider a list of all the medicines, herbs, non-prescription drugs, or dietary supplements you use. Also tell them if you smoke, drink alcohol, or use illegal drugs. Some items may interact with your medicine. What should I watch for while using this medicine? Your condition will be monitored carefully while you are receiving this medicine. You will need important blood work done while you are taking this medicine. This drug may make you feel generally unwell. This is not uncommon, as chemotherapy can affect healthy cells as well as cancer cells. Report any side effects. Continue your course of treatment even though you feel ill unless your doctor tells you to stop. In some cases, you may be given additional medicines to help with side effects. Follow all directions for their use. Call your doctor or health care professional for advice if you get a fever, chills or sore  throat, or other symptoms of a cold or flu. Do not treat yourself. This drug decreases your body's ability to fight infections. Try to avoid being around people who are sick. This medicine may increase your risk to bruise or bleed. Call your doctor or health care professional if you notice any unusual bleeding. Be careful brushing and flossing your teeth or using a toothpick because you may get an infection or bleed more easily. If you have any dental work done, tell your dentist you are receiving this medicine. Avoid taking products that contain aspirin, acetaminophen, ibuprofen, naproxen, or ketoprofen unless instructed by your doctor. These medicines may hide a fever. Do not become pregnant while taking this medicine. Women should inform their doctor if they wish to become pregnant or think they might be pregnant. There is a potential for serious side effects to an unborn child. Talk to your health care professional or pharmacist for more information. Do not breast-feed an infant while taking this medicine. What side effects may I notice from receiving this medicine? Side effects that you should report to your doctor or health care professional as soon as possible: -allergic reactions like skin rash, itching or hives, swelling of the face, lips, or tongue -signs of infection - fever or chills, cough, sore throat, pain or difficulty passing urine -signs of decreased platelets or bleeding - bruising, pinpoint red spots on the skin, black, tarry stools, nosebleeds -signs of decreased red blood cells - unusually weak or tired, fainting spells, lightheadedness -breathing problems -changes in hearing -changes in   vision -chest pain -high blood pressure -low blood counts - This drug may decrease the number of white blood cells, red blood cells and platelets. You may be at increased risk for infections and bleeding. -nausea and vomiting -pain, swelling, redness or irritation at the injection site -pain,  tingling, numbness in the hands or feet -problems with balance, talking, walking -trouble passing urine or change in the amount of urine Side effects that usually do not require medical attention (report to your doctor or health care professional if they continue or are bothersome): -hair loss -loss of appetite -metallic taste in the mouth or changes in taste This list may not describe all possible side effects. Call your doctor for medical advice about side effects. You may report side effects to FDA at 1-800-FDA-1088. Where should I keep my medicine? This drug is given in a hospital or clinic and will not be stored at home. NOTE: This sheet is a summary. It may not cover all possible information. If you have questions about this medicine, talk to your doctor, pharmacist, or health care provider.  2018 [employer]/Gold Standard (2007-11-19 14:38:05) Pegfilgrastim injection What is this medicine? PEGFILGRASTIM (PEG fil gra stim) is a long-acting granulocyte colony-stimulating factor that stimulates the growth of neutrophils, a type of white blood cell important in the body's fight against infection. It is used to reduce the incidence of fever and infection in patients with certain types of cancer who are receiving chemotherapy that affects the bone marrow, and to increase survival after being exposed to high doses of radiation. This medicine may be used for other purposes; ask your health care provider or pharmacist if you have questions. COMMON BRAND NAME(S): Neulasta What should I tell my health care provider before I take this medicine? They need to know if you have any of these conditions: -kidney disease -latex allergy -ongoing radiation therapy -sickle cell disease -skin reactions to acrylic adhesives (On-Body Injector only) -an unusual or allergic reaction to pegfilgrastim, filgrastim, other medicines, foods, dyes, or preservatives -pregnant or trying to get  pregnant -breast-feeding How should I use this medicine? This medicine is for injection under the skin. If you get this medicine at home, you will be taught how to prepare and give the pre-filled syringe or how to use the On-body Injector. Refer to the patient Instructions for Use for detailed instructions. Use exactly as directed. Tell your healthcare provider immediately if you suspect that the On-body Injector may not have performed as intended or if you suspect the use of the On-body Injector resulted in a missed or partial dose. It is important that you put your used needles and syringes in a special sharps container. Do not put them in a trash can. If you do not have a sharps container, call your pharmacist or healthcare provider to get one. Talk to your pediatrician regarding the use of this medicine in children. While this drug may be prescribed for selected conditions, precautions do apply. Overdosage: If you think you have taken too much of this medicine contact a poison control center or emergency room at once. NOTE: This medicine is only for you. Do not share this medicine with others. What if I miss a dose? It is important not to miss your dose. Call your doctor or health care professional if you miss your dose. If you miss a dose due to an On-body Injector failure or leakage, a new dose should be administered as soon as possible using a single prefilled syringe for manual   use. What may interact with this medicine? Interactions have not been studied. Give your health care provider a list of all the medicines, herbs, non-prescription drugs, or dietary supplements you use. Also tell them if you smoke, drink alcohol, or use illegal drugs. Some items may interact with your medicine. This list may not describe all possible interactions. Give your health care provider a list of all the medicines, herbs, non-prescription drugs, or dietary supplements you use. Also tell them if you smoke, drink  alcohol, or use illegal drugs. Some items may interact with your medicine. What should I watch for while using this medicine? You may need blood work done while you are taking this medicine. If you are going to need a MRI, CT scan, or other procedure, tell your doctor that you are using this medicine (On-Body Injector only). What side effects may I notice from receiving this medicine? Side effects that you should report to your doctor or health care professional as soon as possible: -allergic reactions like skin rash, itching or hives, swelling of the face, lips, or tongue -dizziness -fever -pain, redness, or irritation at site where injected -pinpoint red spots on the skin -red or dark-brown urine -shortness of breath or breathing problems -stomach or side pain, or pain at the shoulder -swelling -tiredness -trouble passing urine or change in the amount of urine Side effects that usually do not require medical attention (report to your doctor or health care professional if they continue or are bothersome): -bone pain -muscle pain This list may not describe all possible side effects. Call your doctor for medical advice about side effects. You may report side effects to FDA at 1-800-FDA-1088. Where should I keep my medicine? Keep out of the reach of children. Store pre-filled syringes in a refrigerator between 2 and 8 degrees C (36 and 46 degrees F). Do not freeze. Keep in carton to protect from light. Throw away this medicine if it is left out of the refrigerator for more than 48 hours. Throw away any unused medicine after the expiration date. NOTE: This sheet is a summary. It may not cover all possible information. If you have questions about this medicine, talk to your doctor, pharmacist, or health care provider.  2018 [employer] Standard (2016-08-10 12:58:03)  

## 2018-02-25 NOTE — Progress Notes (Signed)
START ON PATHWAY REGIMEN - Ovarian     A cycle is every 21 days:     Paclitaxel      Carboplatin   **Always confirm dose/schedule in your pharmacy ordering system**  Patient Characteristics: Newly Diagnosed, Adjuvant Therapy, Stage IIA Therapeutic Status: Newly Diagnosed AJCC T Category: T2a AJCC N Category: N0 AJCC M Category: M0 AJCC 8 Stage Grouping: IIA BRCA Mutation Status: Awaiting Test Results Intent of Therapy: Curative Intent, Discussed with Patient

## 2018-02-25 NOTE — Progress Notes (Signed)
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 Theora Gianotti  Date of visit: 02/25/18   History of presenting illness-patient is a 77 year old female with a past medical history significant for hyperlipidemia and Graves' disease.  She was recently complaining of back pain which led to an MRI of the lumbosacral spine.  MRI in March 2019 which incidentally showed a multicystic 4 x 5.5 cm right adnexal mass with blood fluid levels.  Prominent uterine endometrial stripe.  This was followed by a pelvic ultrasound in April 2019 which showed uterus measuring 9.7 x 4.1 x 5.9 cm.  Multiloculated complex cystic structure in the right adnexa measuring 6.3 x 6.1 x 4.7 cm.  It has solid-appearing components as well as anechoic portions separated by thick septations.  No normal ovarian parenchyma is identified.  No pelvic free fluid.  Left ovary was not seen.  This was not further evaluated until patient's met with her gynecologist and was then referred to gynecology oncology Dr. Carlton Adam at [facility].  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 Theora Gianotti on 02/12/2018.  She had a CT scan of her abdomen and pelvis on 02/09/2018 which showed multiseptated complex cystic lesion in the right pelvis.  At adnexa immediately superior to the uterus measuring 6.9 x 5.3 cm.  Abnormal thickening of the endometrial cavity measuring 1.3 cm.  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  Tumor is positive for PAX 8, WT 1 and p53.  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  3 lymph nodes negative for malignancy on the right and the left side.  Omental biopsy was negative.  Peritoneal biopsy was negative for malignancy. pT2a pN0  Baseline tumor markers including CA125 normal at 18  Patient has already met with Dr. Bary Castilla this morning and has had a port placement.  She is recovering well post surgery. She is living with her family here in [place] and wishes to get treatment here. Denies any pain or fatigue   ECOG PS- 1  Pain scale- 0   Review of systems- Review of Systems  Constitutional: Negative for chills, fever, malaise/fatigue and weight loss.  HENT: Negative for congestion, ear discharge and nosebleeds.   Eyes: Negative for blurred vision.  Respiratory: Negative for cough, hemoptysis, sputum production, shortness of breath and wheezing.   Cardiovascular: Negative for chest pain, palpitations, orthopnea and claudication.  Gastrointestinal: Negative for abdominal pain, blood in stool, constipation, diarrhea, heartburn, melena, nausea and vomiting.  Genitourinary: Negative for dysuria, flank pain, frequency, hematuria and urgency.  Musculoskeletal: Negative for back pain, joint pain and myalgias.  Skin: Negative for rash.  Neurological: Negative for dizziness, tingling, focal weakness, seizures, weakness and headaches.  Endo/Heme/Allergies: Does not bruise/bleed easily.  Psychiatric/Behavioral: Negative for depression and suicidal ideas. The patient does not have insomnia.     Allergies  Allergen Reactions  . Epinephrine Other (See Comments)    Funny feeling, heart racing     Patient Active Problem List   Diagnosis Date Noted  . Ovarian cancer on  right (HCC) 02/22/2018     Past Medical History:  Diagnosis Date  . Cancer (HCC) 2019   right sided ovarian cancer  . Hyperlipidemia   . Thyroid disease      Past Surgical History:  Procedure Laterality Date  . CHOLECYSTECTOMY  2008  . TOTAL ABDOMINAL HYSTERECTOMY W/ BILATERAL SALPINGOOPHORECTOMY  02/15/2018   lymph nodes removed    Social History   Socioeconomic History  . Marital status: Widowed    Spouse name: Not on file  . Number of children: Not on file  . Years of education: Not on file  . Highest education level: Not on file  Occupational History  . Not on file  Social Needs  . Financial resource strain: Not on file  . Food insecurity:    Worry: Not on file    Inability: Not on file  . Transportation needs:    Medical: Not on file    Non-medical: Not on file  Tobacco Use  . Smoking status: Former Smoker    Years: 25.00    Types: Cigarettes    Last attempt to quit: 02/26/1984    Years since quitting: 34.0  . Smokeless tobacco: Never Used  Substance and Sexual Activity  . Alcohol use: Never    Frequency: Never  . Drug use: Never  . Sexual activity: Not on file  Lifestyle  . Physical activity:    Days per week: Not on file    Minutes per session: Not on file  . Stress: Not on file  Relationships  . Social connections:    Talks on phone: Not on file    Gets together: Not on file    Attends religious service: Not on file    Active member of club or organization: Not on file    Attends meetings of clubs or organizations: Not on file    Relationship status: Not on file  . Intimate partner violence:    Fear of current or ex partner: Not on file    Emotionally abused: Not on file    Physically abused: Not on file    Forced sexual activity: Not on file  Other Topics Concern  . Not on file  Social History Narrative  . Not on file     No family history on file.   Current Outpatient Medications:  .  acetaminophen (TYLENOL) 500 MG tablet, Take 500  mg by mouth every 6 (six) hours as needed for moderate pain., Disp: , Rfl:  .  HYDROcodone-acetaminophen (NORCO/VICODIN) 5-325 MG tablet, Take 1 tablet by mouth every 4 (four) hours as needed for moderate pain., Disp: 10 tablet, Rfl: 0 .  OVER THE COUNTER MEDICATION, Obagi facial cleansing otc system - use as directed, Disp: , Rfl:  .  rosuvastatin (CRESTOR) 10 MG tablet, Take 10 mg by mouth every evening. , Disp: , Rfl:  .  SYNTHROID 175 MCG tablet, Take 175 mcg by mouth daily. , Disp: , Rfl:  .  VESICARE 5 MG tablet, Take 5 mg by mouth every evening. , Disp: , Rfl:  .  Vitamin D, Ergocalciferol, (DRISDOL) 50000 units CAPS capsule, Take 50,000 Units by mouth every Wednesday., Disp: , Rfl:  No current facility-administered medications for this visit.   Facility-Administered Medications Ordered in Other Visits:  .  fentaNYL (  SUBLIMAZE) injection 25 mcg, 25 mcg, Intravenous, Q5 min PRN, Carroll, Paul, MD .  lactated ringers infusion, , Intravenous, Continuous, Adams, James G, MD, Last Rate: 50 mL/hr at 02/25/18 0810 .  ondansetron (ZOFRAN) injection 4 mg, 4 mg, Intravenous, Once PRN, Carroll, Paul, MD   Physical exam:  Vitals:   02/25/18 1338  BP: 104/62  Pulse: 71  Resp: 18  Temp: (!) 95.9 F (35.5 C)  TempSrc: Tympanic  Weight: 180 lb 14.2 oz (82.1 kg)  Height: 5' 6.14" (1.68 m)   Physical Exam  Constitutional: She is oriented to person, place, and time. She appears well-developed and well-nourished.  HENT:  Head: Normocephalic and atraumatic.  Eyes: Pupils are equal, round, and reactive to light. EOM are normal.  Neck: Normal range of motion.  Cardiovascular: Normal rate, regular rhythm and normal heart sounds.  Chest wall port in place  Pulmonary/Chest: Effort normal and breath sounds normal.  Abdominal: Soft. Bowel sounds are normal.  Scattered bruising noted over abdomen. Laparoscopy scar is healing well  Musculoskeletal: She exhibits no edema.  Neurological: She is alert  and oriented to person, place, and time.  Skin: Skin is warm and dry.       No flowsheet data found. No flowsheet data found.  No images are attached to the encounter.  Dg Chest Port 1 View  Result Date: 02/25/2018 CLINICAL DATA:  Status post Port-A-Cath appliance placement. EXAM: PORTABLE CHEST 1 VIEW COMPARISON:  None in PACs FINDINGS: The patient has undergone porta catheter placement via the left subclavian approach with the tip projecting at the junction of the middle and distal thirds of the SVC. The lungs are well-expanded. There is no pneumothorax or pleural effusion. Minimal subsegmental atelectasis or scarring at the left base is present. The heart and pulmonary vascularity are normal. The bony thorax is unremarkable. IMPRESSION: No postprocedure complication following porta catheter placement. Electronically Signed   By: David  Jordan M.D.   On: 02/25/2018 09:47   Dg C-arm 1-60 Min-no Report  Result Date: 02/25/2018 Fluoroscopy was utilized by the requesting physician.  No radiographic interpretation.    Assessment and plan- Patient is a 76 y.o. 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 Given that patient had a high tumor grade, stage II disease and serous histology I would recommend adjuvant chemotherapy.  With carboplatin and Taxol.  I would recommend carboplatin given at AUC 5 along with Taxol at 175 mg/m IV every 3 weeks for up to 6 cycles with on pro-Neulasta support.    Discussed risks and benefits of chemotherapy including all but not limited to nausea, vomiting, fatigue, hair loss, risk of peripheral neuropathy and infusion reaction associated with Taxol.  Patient understands and agrees to proceed as planned.  Both progression free survival and overall survival has been shown to be better as compared to observation alone given high risk features.  Also discussed that 6 cycles  were found to be better than 3 cycles based on GOG 157 trial especially in patients with serous tumors in terms of risk of recurrence.  5-year overall survival was comparable between the 2 groups.  Based on a report from GOG study of 506 women treated in 2 trials of adjuvant treatment for early stage ovarian carcinoma recurrence free and overall survival at 5 years was about 76 and 82% respectively.  Recurrence rate based on GOG 157 trial was about 25%    Patient will need a CT chest with contrast to complete her staging work-up which we will order. Recent bmp from 02/16/18 was normal. We will plan to get a chemo class done this week.  Treatment will be given with a curative intent  She will also need genetic testing for BRCA 1 and 2. We will order invitae testing at next visit.   Patient has a [place] coming up in a weeks time and would like to start chemotherapy after that. Given that 1 retrospective study showed survival advantage if chemotherapy was started between 21-35 days post surgery, I would not like to delay chemo >35 days. Patient will return back from her trip on 03/19/18. We will plan to start chemotherapy on 03/21/18. Check cbc, cmp on that day  Cancer Staging Ovarian carcinoma (HCC) Staging form: Ovary, Fallopian Tube, and Primary Peritoneal Carcinoma, AJCC 8th Edition - Clinical stage from 02/25/2018: FIGO Stage IIA (cT2a, cN0, cM0) - Signed by Rao, Archana C, MD on 02/25/2018    Thank you for this kind referral and the opportunity to participate in the care of this patient   Visit Diagnosis 1. Carcinoma of right ovary (HCC)   2. Goals of care, counseling/discussion     Dr. Archana Rao, MD, MPH [facility] at [facility] [phone] 02/25/2018  3:03 PM                 

## 2018-02-25 NOTE — Anesthesia Procedure Notes (Signed)
Procedure Name: LMA Insertion Date/Time: 02/25/2018 8:43 AM Performed by: Justus Memory, CRNA Pre-anesthesia Checklist: Patient identified, Patient being monitored, Timeout performed, Emergency Drugs available and Suction available Patient Re-evaluated:Patient Re-evaluated prior to induction Oxygen Delivery Method: Circle system utilized Preoxygenation: Pre-oxygenation with 100% oxygen Induction Type: IV induction Ventilation: Mask ventilation without difficulty LMA: LMA inserted LMA Size: 4.0 Tube type: Oral Number of attempts: 1 Placement Confirmation: positive ETCO2 and breath sounds checked- equal and bilateral Tube secured with: Tape Dental Injury: Teeth and Oropharynx as per pre-operative assessment  Comments: 3.5 airQ, unable to obtain adequate TV's changed to traditional size 4 LMA

## 2018-02-25 NOTE — Progress Notes (Signed)
Here for new pt evaluation.  

## 2018-02-25 NOTE — Transfer of Care (Signed)
Immediate Anesthesia Transfer of Care Note  Patient: Melissa Anderson  Procedure(s) Performed: INSERTION PORT-A-CATH (Left Chest)  Patient Location: PACU  Anesthesia Type:General  Level of Consciousness: sedated  Airway & Oxygen Therapy: Patient Spontanous Breathing and Patient connected to face mask oxygen  Post-op Assessment: Report given to RN and Post -op Vital signs reviewed and stable  Post vital signs: Reviewed and stable  Last Vitals:  Vitals Value Taken Time  BP 113/54 02/25/2018  9:28 AM  Temp 36.2 C 02/25/2018  9:28 AM  Pulse 70 02/25/2018  9:35 AM  Resp 18 02/25/2018  9:35 AM  SpO2 100 % 02/25/2018  9:35 AM  Vitals shown include unvalidated device data.  Last Pain:  Vitals:   02/25/18 0928  TempSrc:   PainSc: 0-No pain         Complications: No apparent anesthesia complications

## 2018-02-25 NOTE — Progress Notes (Signed)
Dr Terri Piedra says pt may be discharged

## 2018-02-25 NOTE — Progress Notes (Signed)
Ice pack to left chest

## 2018-02-25 NOTE — H&P (Signed)
No change in clinical history or exam.  Left subclavian PowerPort plan for adjuvant chemotherapy.

## 2018-02-25 NOTE — Anesthesia Preprocedure Evaluation (Signed)
Anesthesia Evaluation  Patient identified by MRN, date of birth, ID band Patient awake    Reviewed: Allergy & Precautions, NPO status , Patient's Chart, lab work & pertinent test results  Airway Mallampati: III  TM Distance: <3 FB     Dental  (+) Teeth Intact   Pulmonary former smoker,    Pulmonary exam normal        Cardiovascular negative cardio ROS Normal cardiovascular exam     Neuro/Psych negative neurological ROS  negative psych ROS   GI/Hepatic negative GI ROS, Neg liver ROS,   Endo/Other  Hypothyroidism   Renal/GU negative Renal ROS  Female GU complaint     Musculoskeletal negative musculoskeletal ROS (+)   Abdominal Normal abdominal exam  (+)   Peds negative pediatric ROS (+)  Hematology negative hematology ROS (+)   Anesthesia Other Findings Past Medical History: 2019: Cancer ([place])     Comment:  right sided ovarian cancer No date: Hyperlipidemia No date: Thyroid disease  Reproductive/Obstetrics                             Anesthesia Physical Anesthesia Plan  ASA: II  Anesthesia Plan: General   Post-op Pain Management:    Induction: Intravenous  PONV Risk Score and Plan: TIVA  Airway Management Planned:   Additional Equipment:   Intra-op Plan:   Post-operative Plan:   Informed Consent: I have reviewed the patients History and Physical, chart, labs and discussed the procedure including the risks, benefits and alternatives for the proposed anesthesia with the patient or authorized representative who has indicated his/her understanding and acceptance.   Dental advisory given  Plan Discussed with: CRNA and Surgeon  Anesthesia Plan Comments:         Anesthesia Quick Evaluation

## 2018-02-25 NOTE — Op Note (Signed)
Preoperative diagnosis: Fallopian tube cancer, candidate for adjuvant chemotherapy.  Need for central venous access.  Postoperative diagnosis: Same.  Operative procedure: Left subclavian PowerPort placement with ultrasound and fluoroscopic guidance.  Operating Surgeon: Hervey Ard, MD.  Anesthesia: General by LMA, 1% Xylocaine, plain, 10 cc.  Estimated blood loss: None.  Clinical note: This 77 year old woman recently underwent a TAH/BSO and pelvic node dissection for tube cancer of the fallopian tube.  She is stage II and felt to be a candidate for adjuvant chemotherapy.  Central venous access was requested by the treating oncologist.  Operative note: The patient received Kefzol prior to the procedure.  After the induction of general anesthesia the left chest and neck was cleansed with ChloraPrep and draped.  Local anesthesia was infiltrated for postop analgesia.  In Trendelenburg position the subclavian vein was identified and found to be patent on ultrasound imaging.  It did take some effort to successfully cannulate the vein even with ultrasound, although this was subsequently completed.  The guidewire was advanced and the catheter placed to the junction of the SVC and right atrium under fluoroscopic guidance.  It was tunneled to a pocket on the left anterior chest.  The port was anchored to the deep tissue with the adipose layer was approximated with a running 3-0 Vicryl suture.  Skin was closed with a running 4-0 Vicryl subarticular suture.  Benzoin, Steri-Strips, Telfa and Tegaderm dressing was applied.  The patient tolerated the procedure well and was taken to recovery room in stable condition.  3-0 Prolene interrupted sutures x2.   Erect portable chest x-ray in recovery room showed the catheter tip as described above and no evidence of pneumothorax.

## 2018-02-25 NOTE — Anesthesia Post-op Follow-up Note (Signed)
Anesthesia QCDR form completed.        

## 2018-02-26 ENCOUNTER — Telehealth: Payer: Self-pay | Admitting: Genetic Counselor

## 2018-02-26 ENCOUNTER — Encounter: Payer: Self-pay | Admitting: General Surgery

## 2018-02-26 ENCOUNTER — Inpatient Hospital Stay: Payer: Medicare Other

## 2018-02-26 DIAGNOSIS — C561 Malignant neoplasm of right ovary: Secondary | ICD-10-CM | POA: Diagnosis not present

## 2018-02-26 DIAGNOSIS — C562 Malignant neoplasm of left ovary: Secondary | ICD-10-CM | POA: Diagnosis not present

## 2018-02-26 NOTE — Telephone Encounter (Signed)
Dr. Janese Banks is referring Melissa Anderson for genetic counseling due to a personal and family history of cancer. I left her a message to call and schedule this telegenetics visit to be done by phone at her convenience.   Melissa Anderson, [place], [place] Genetic Counselor Phone: [phone]

## 2018-02-26 NOTE — Telephone Encounter (Signed)
Cancer Genetics            Telegenetics Initial Visit    Patient Name: Melissa Anderson Patient DOB: 12-16-1940 Patient Age: 77 y.o. Phone Call Date: 02/26/2018  Referring Provider: Randa Evens, MD  Reason for Visit: Evaluate for hereditary susceptibility to cancer    Assessment and Plan:  . Ms. Battey's personal history of ovarian cancer is not highly suggestive of a hereditary predisposition to cancer when considering her family history. She meets NCCN criteria for genetic testing due to her personal history as this information may impact future treatment. .  . Testing is recommended to determine whether she has a pathogenic mutation that will impact her potential future treatment option, screening and risk-reduction for cancer. A negative result will be reassuring.  . Ms. Fringer wished to pursue genetic testing. She will be contacted for a lab appointment to have her blood drawn. Analysis will include the 83 genes on [facility]'s Multi-Cancer panel (ALK, APC, ATM, AXIN2, BAP1, BARD1, BLM, BMPR1A, BRCA1, BRCA2, BRIP1, CASR, CDC73, CDH1, CDK4, CDKN1B, CDKN1C, CDKN2A, CEBPA, CHEK2, CTNNA1, DICER1, DIS3L2, EGFR, EPCAM, FH, FLCN, GATA2, GPC3, GREM1, HOXB13, HRAS, KIT, MAX, MEN1, MET, MITF, MLH1, MSH2, MSH3, MSH6, MUTYH, NBN, NF1, NF2, NTHL1, PALB2, PDGFRA, PHOX2B, PMS2, POLD1, POLE, POT1, PRKAR1A, PTCH1, PTEN, RAD50, RAD51C, RAD51D, RB1, RECQL4, RET, RUNX1, SDHA, SDHAF2, SDHB, SDHC, SDHD, SMAD4, SMARCA4, SMARCB1, SMARCE1, STK11, SUFU, TERC, TERT, TMEM127, TP53, TSC1, TSC2, VHL, WRN, WT1).   . Once the lab receives her specimen, results should be available in approximately 2-3 weeks, at which point we will contact her and address implications for her as well as address genetic testing for at-risk family members, if needed.     Dr. Grayland Ormond was available for questions concerning this case. Total time spent by counseling by phone was approximately 20 minutes.    _____________________________________________________________________   History of Present Illness: Ms. Melissa Anderson, a 77 y.o. female, was referred for genetic counseling to discuss the possibility of a hereditary predisposition to cancer and discuss whether genetic testing is warranted. This was a telegenetics visit via phone.  Ms. Behler was diagnosed with ovarian cancer at the age of 66. She is s/p surgery and will be receiving chemotherapy soon.   She reports she has a mammogram every 1-2 years. She stated that she has never had a colonoscopy and has not had any other type of colon cancer screening.    Ovarian carcinoma ([place])   02/22/2018 Initial Diagnosis    Ovarian carcinoma ([place])      02/25/2018 Cancer Staging    Staging form: Ovary, Fallopian Tube, and Primary Peritoneal Carcinoma, AJCC 8th Edition - Clinical stage from 02/25/2018: FIGO Stage IIA (cT2a, cN0, cM0) - Signed by Sindy Guadeloupe, MD on 02/25/2018      02/25/2018 -  Chemotherapy    The patient had palonosetron (ALOXI) injection 0.25 mg, 0.25 mg, Intravenous,  Once, 0 of 4 cycles pegfilgrastim (NEULASTA ONPRO KIT) injection 6 mg, 6 mg, Subcutaneous, Once, 0 of 4 cycles CARBOplatin (PARAPLATIN) in sodium chloride 0.9 % 100 mL chemo infusion, , Intravenous,  Once, 0 of 4 cycles PACLitaxel (TAXOL) 342 mg in sodium chloride 0.9 % 500 mL chemo infusion (> 72m/m2), 175 mg/m2, Intravenous,  Once, 0 of 4 cycles  for chemotherapy treatment.        Past Medical History:  Diagnosis Date  . Cancer ([facility] 2019   right sided ovarian cancer  .  Hyperlipidemia   . Thyroid disease     Past Surgical History:  Procedure Laterality Date  . CHOLECYSTECTOMY  2008  . LAPAROSCOPIC VAGINAL HYSTERECTOMY WITH SALPINGO OOPHORECTOMY  02/15/2018  . PORTACATH PLACEMENT Left 02/25/2018   Procedure: INSERTION PORT-A-CATH;  Surgeon: Robert Bellow, MD;  Location: [facility] ORS;  Service: General;  Laterality: Left;  . TOTAL ABDOMINAL  HYSTERECTOMY W/ BILATERAL SALPINGOOPHORECTOMY  02/15/2018   lymph nodes removed    Family History: Significant diagnoses include the following:  Family History  Problem Relation Age of Onset  . Sudden death Mother 29       drowning in hurricane Katrina  . Pancreatic cancer Father        deceased early 46s; smoker  . Coronary artery disease Brother   . Melanoma Sister        deceased at 64  . Prostate cancer Maternal Uncle        deceased 80  . Breast cancer Paternal Aunt        unsure of this dx    Additionally, Ms. Gillyard has 2 daughters (ages 82 and 72). She has 2 brothers (ages 17 and 44) and a sister (age 43). Her other sister is noted above. Her mother had a brother (noted above) and a sister. Her father had 2 brothers and 2 sisters.  Ms. Spoon ancestry is Caucasian - NOS. There is no known Jewish ancestry and no consanguinity.  Discussion: We reviewed the characteristics, features and inheritance patterns of hereditary cancer syndromes. We discussed her risk of harboring a mutation in the context of her personal and family history. We addressed how genetic testing may be beneficial for her own screening and potentially treatment options for ovarian cancer as well as potentially helpful to family members. We discussed the process of genetic testing, insurance coverage and implications of results: positive, negative and variant of unknown significance (VUS).   Ms. Altizer questions were answered to her satisfaction today and she is welcome to call with any additional questions or concerns. Thank you for the referral and allowing [place] to share in the care of your patient.    Steele Berg, MS, [place] Certified Genetic Counselor phone: [phone]

## 2018-02-26 NOTE — Anesthesia Postprocedure Evaluation (Signed)
Anesthesia Post Note  Patient: Melissa Anderson  Procedure(s) Performed: INSERTION PORT-A-CATH (Left Chest)  Patient location during evaluation: PACU Anesthesia Type: General Level of consciousness: awake and alert and oriented Pain management: pain level controlled Vital Signs Assessment: post-procedure vital signs reviewed and stable Respiratory status: spontaneous breathing Cardiovascular status: blood pressure returned to baseline Anesthetic complications: no     Last Vitals:  Vitals:   02/25/18 0959 02/25/18 1009  BP:  (!) 131/57  Pulse: 64 (!) 59  Resp: 15 16  Temp: (!) 36.1 C (!) 36.2 C  SpO2: 100% 100%    Last Pain:  Vitals:   02/26/18 0831  TempSrc:   PainSc: 0-No pain                 Debie Ashline

## 2018-02-26 NOTE — Discharge Instructions (Signed)

## 2018-02-27 ENCOUNTER — Ambulatory Visit
Admission: RE | Admit: 2018-02-27 | Discharge: 2018-02-27 | Disposition: A | Discharge status: Home / Self Care | Payer: Medicare Other | Source: Ambulatory Visit | Attending: Oncology | Admitting: Oncology

## 2018-02-27 DIAGNOSIS — C561 Malignant neoplasm of right ovary: Secondary | ICD-10-CM

## 2018-02-27 DIAGNOSIS — R918 Other nonspecific abnormal finding of lung field: Secondary | ICD-10-CM | POA: Insufficient documentation

## 2018-02-27 DIAGNOSIS — I700 Atherosclerosis of aorta: Secondary | ICD-10-CM | POA: Insufficient documentation

## 2018-02-27 MED ORDER — IOPAMIDOL (ISOVUE-300) INJECTION 61%
100.0000 mL | Freq: Once | INTRAVENOUS | Status: AC | PRN
  Administered 2018-02-27: 75 mL via INTRAVENOUS

## 2018-03-04 ENCOUNTER — Ambulatory Visit: Payer: Self-pay

## 2018-03-04 ENCOUNTER — Other Ambulatory Visit: Payer: Self-pay

## 2018-03-21 ENCOUNTER — Inpatient Hospital Stay: Payer: Medicare Other

## 2018-03-21 ENCOUNTER — Encounter: Payer: Self-pay | Admitting: Oncology

## 2018-03-21 ENCOUNTER — Inpatient Hospital Stay (HOSPITAL_BASED_OUTPATIENT_CLINIC_OR_DEPARTMENT_OTHER): Payer: Medicare Other | Admitting: Oncology

## 2018-03-21 ENCOUNTER — Other Ambulatory Visit: Payer: Self-pay

## 2018-03-21 VITALS — BP 125/74 | HR 78 | Temp 97.7°F | Resp 18 | Ht 66.0 in | Wt 183.1 lb

## 2018-03-21 VITALS — BP 137/84 | HR 87 | Temp 97.2°F | Resp 20

## 2018-03-21 DIAGNOSIS — C5702 Malignant neoplasm of left fallopian tube: Secondary | ICD-10-CM | POA: Diagnosis not present

## 2018-03-21 DIAGNOSIS — Z5111 Encounter for antineoplastic chemotherapy: Secondary | ICD-10-CM | POA: Diagnosis not present

## 2018-03-21 DIAGNOSIS — C569 Malignant neoplasm of unspecified ovary: Secondary | ICD-10-CM | POA: Diagnosis not present

## 2018-03-21 DIAGNOSIS — Z90710 Acquired absence of both cervix and uterus: Secondary | ICD-10-CM | POA: Diagnosis not present

## 2018-03-21 DIAGNOSIS — E785 Hyperlipidemia, unspecified: Secondary | ICD-10-CM

## 2018-03-21 DIAGNOSIS — Z809 Family history of malignant neoplasm, unspecified: Secondary | ICD-10-CM | POA: Diagnosis not present

## 2018-03-21 DIAGNOSIS — Z87891 Personal history of nicotine dependence: Secondary | ICD-10-CM | POA: Diagnosis not present

## 2018-03-21 DIAGNOSIS — E559 Vitamin D deficiency, unspecified: Secondary | ICD-10-CM

## 2018-03-21 DIAGNOSIS — Z90722 Acquired absence of ovaries, bilateral: Secondary | ICD-10-CM

## 2018-03-21 DIAGNOSIS — C561 Malignant neoplasm of right ovary: Secondary | ICD-10-CM | POA: Diagnosis not present

## 2018-03-21 DIAGNOSIS — C562 Malignant neoplasm of left ovary: Secondary | ICD-10-CM | POA: Diagnosis not present

## 2018-03-21 DIAGNOSIS — E0500 Thyrotoxicosis with diffuse goiter without thyrotoxic crisis or storm: Secondary | ICD-10-CM

## 2018-03-21 DIAGNOSIS — Z803 Family history of malignant neoplasm of breast: Secondary | ICD-10-CM | POA: Diagnosis not present

## 2018-03-21 DIAGNOSIS — R9389 Abnormal findings on diagnostic imaging of other specified body structures: Secondary | ICD-10-CM

## 2018-03-21 LAB — CBC WITH DIFFERENTIAL/PLATELET
BASOS ABS: 0.1 10*3/uL (ref 0–0.1)
Basophils Relative: 1 %
EOS ABS: 0.3 10*3/uL (ref 0–0.7)
Eosinophils Relative: 5 %
HCT: 37 % (ref 35.0–47.0)
Hemoglobin: 12.3 g/dL (ref 12.0–16.0)
Lymphocytes Relative: 16 %
Lymphs Abs: 1 10*3/uL (ref 1.0–3.6)
MCH: 31.4 pg (ref 26.0–34.0)
MCHC: 33.4 g/dL (ref 32.0–36.0)
MCV: 94.1 fL (ref 80.0–100.0)
Monocytes Absolute: 0.6 10*3/uL (ref 0.2–0.9)
Monocytes Relative: 9 %
Neutro Abs: 4.3 10*3/uL (ref 1.4–6.5)
Neutrophils Relative %: 69 %
PLATELETS: 253 10*3/uL (ref 150–440)
RBC: 3.93 MIL/uL (ref 3.80–5.20)
RDW: 13.9 % (ref 11.5–14.5)
WBC: 6.3 10*3/uL (ref 3.6–11.0)

## 2018-03-21 LAB — COMPREHENSIVE METABOLIC PANEL
ALT: 17 U/L (ref 0–44)
AST: 20 U/L (ref 15–41)
Albumin: 3.6 g/dL (ref 3.5–5.0)
Alkaline Phosphatase: 59 U/L (ref 38–126)
Anion gap: 10 (ref 5–15)
BILIRUBIN TOTAL: 0.5 mg/dL (ref 0.3–1.2)
BUN: 24 mg/dL — ABNORMAL HIGH (ref 8–23)
CO2: 25 mmol/L (ref 22–32)
CREATININE: 0.71 mg/dL (ref 0.44–1.00)
Calcium: 9 mg/dL (ref 8.9–10.3)
Chloride: 110 mmol/L (ref 98–111)
Glucose, Bld: 125 mg/dL — ABNORMAL HIGH (ref 70–99)
POTASSIUM: 4.3 mmol/L (ref 3.5–5.1)
Sodium: 145 mmol/L (ref 135–145)
TOTAL PROTEIN: 6.4 g/dL — AB (ref 6.5–8.1)

## 2018-03-21 MED ORDER — SODIUM CHLORIDE 0.9% FLUSH
10.0000 mL | INTRAVENOUS | Status: DC | PRN
  Administered 2018-03-21: 10 mL
  Filled 2018-03-21: qty 10

## 2018-03-21 MED ORDER — PALONOSETRON HCL INJECTION 0.25 MG/5ML
0.2500 mg | Freq: Once | INTRAVENOUS | Status: AC
  Administered 2018-03-21: 0.25 mg via INTRAVENOUS
  Filled 2018-03-21: qty 5

## 2018-03-21 MED ORDER — SODIUM CHLORIDE 0.9 % IV SOLN
175.0000 mg/m2 | Freq: Once | INTRAVENOUS | Status: AC
  Administered 2018-03-21: 342 mg via INTRAVENOUS
  Filled 2018-03-21: qty 57

## 2018-03-21 MED ORDER — FAMOTIDINE IN NACL 20-0.9 MG/50ML-% IV SOLN
20.0000 mg | Freq: Once | INTRAVENOUS | Status: AC | PRN
  Administered 2018-03-21: 20 mg via INTRAVENOUS

## 2018-03-21 MED ORDER — DIPHENHYDRAMINE HCL 50 MG/ML IJ SOLN
50.0000 mg | Freq: Once | INTRAMUSCULAR | Status: AC
  Administered 2018-03-21: 50 mg via INTRAVENOUS
  Filled 2018-03-21: qty 1

## 2018-03-21 MED ORDER — DIPHENHYDRAMINE HCL 50 MG/ML IJ SOLN
25.0000 mg | Freq: Once | INTRAMUSCULAR | Status: AC | PRN
  Administered 2018-03-21: 25 mg via INTRAVENOUS

## 2018-03-21 MED ORDER — SODIUM CHLORIDE 0.9 % IV SOLN
430.0000 mg | Freq: Once | INTRAVENOUS | Status: AC
  Administered 2018-03-21: 430 mg via INTRAVENOUS
  Filled 2018-03-21: qty 43

## 2018-03-21 MED ORDER — METHYLPREDNISOLONE SODIUM SUCC 125 MG IJ SOLR
125.0000 mg | Freq: Once | INTRAMUSCULAR | Status: AC | PRN
  Administered 2018-03-21: 125 mg via INTRAVENOUS

## 2018-03-21 MED ORDER — HEPARIN SOD (PORK) LOCK FLUSH 100 UNIT/ML IV SOLN
500.0000 [IU] | Freq: Once | INTRAVENOUS | Status: AC | PRN
  Administered 2018-03-21: 500 [IU]
  Filled 2018-03-21: qty 5

## 2018-03-21 MED ORDER — PEGFILGRASTIM 6 MG/0.6ML ~~LOC~~ PSKT
6.0000 mg | PREFILLED_SYRINGE | Freq: Once | SUBCUTANEOUS | Status: AC
  Administered 2018-03-21: 6 mg via SUBCUTANEOUS
  Filled 2018-03-21: qty 0.6

## 2018-03-21 MED ORDER — FAMOTIDINE IN NACL 20-0.9 MG/50ML-% IV SOLN
20.0000 mg | Freq: Once | INTRAVENOUS | Status: AC
  Administered 2018-03-21: 20 mg via INTRAVENOUS
  Filled 2018-03-21: qty 50

## 2018-03-21 MED ORDER — DEXAMETHASONE SODIUM PHOSPHATE 100 MG/10ML IJ SOLN
20.0000 mg | Freq: Once | INTRAMUSCULAR | Status: AC
  Administered 2018-03-21: 20 mg via INTRAVENOUS
  Filled 2018-03-21: qty 2

## 2018-03-21 MED ORDER — SODIUM CHLORIDE 0.9 % IV SOLN
Freq: Once | INTRAVENOUS | Status: AC | PRN
  Administered 2018-03-21: 12:00:00 via INTRAVENOUS
  Filled 2018-03-21: qty 1000

## 2018-03-21 MED ORDER — SODIUM CHLORIDE 0.9 % IV SOLN
Freq: Once | INTRAVENOUS | Status: AC
  Administered 2018-03-21: 10:00:00 via INTRAVENOUS
  Filled 2018-03-21: qty 1000

## 2018-03-21 NOTE — Progress Notes (Addendum)
Hematology/Oncology Consult note [facility]  Telephone:([phone] [phone]  Patient Care Team: System, Pcp Not In as PCP - General   Name of the patient: Melissa Anderson  [id number]  07-Jul-1941   Date of visit: 03/21/18  Diagnosis- Stage II ovarian cancer  Chief complaint/ Reason for visit-on treatment assessment prior to cycle #1 of carbotaxol  Heme/Onc history: patient is a 77 year old female with a past medical history significant for hyperlipidemia and Graves' disease.  She was recently complaining of back pain which led to an MRI of the lumbosacral spine.  MRI in March 2019 which incidentally showed a multicystic 4 x 5.5 cm right adnexal mass with blood fluid levels.  Prominent uterine endometrial stripe.  This was followed by a pelvic ultrasound in April 2019 which showed uterus measuring 9.7 x 4.1 x 5.9 cm.  Multiloculated complex cystic structure in the right adnexa measuring 6.3 x 6.1 x 4.7 cm.  It has solid-appearing components as well as anechoic portions separated by thick septations.  No normal ovarian parenchyma is identified.  No pelvic free fluid.  Left ovary was not seen.  This was not further evaluated until patient's met with her gynecologist and was then referred to gynecology oncology Dr. Carlton Adam at [facility].  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 Theora Gianotti on 02/12/2018.  She had a CT scan of her abdomen and pelvis on 02/09/2018 which showed multiseptated complex cystic lesion in the right pelvis.  At adnexa immediately superior to the uterus measuring 6.9 x 5.3 cm.  Abnormal thickening of the endometrial cavity measuring 1.3 cm.  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  Tumor is positive for PAX 8, WT 1 and p53.  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  3 lymph nodes negative for malignancy on the right and the left side.  Omental biopsy was negative.  Peritoneal biopsy was negative for malignancy. pT2a pN0  Baseline tumor markers including CA125 normal at 18  Patient has already met with Dr. Bary Castilla this morning and has had a port placement.  She is recovering well post surgery. She is living with her family here in [place] and wishes to get treatment here. Denies any pain or fatigue   Interval history- patient just got back from her [place] vacation.  She feels well overall.  She does have bilateral lower extremity swelling since her flight which is gradually improving.  She denies any fatigue or abdominal pain.  Her bowel movements are regular  ECOG PS- 0 Pain scale- 0 Opioid associated constipation- no  Review of systems- Review of Systems  Constitutional: Negative for chills, fever, malaise/fatigue and weight loss.  HENT: Negative for congestion, ear discharge and nosebleeds.   Eyes: Negative for blurred vision.  Respiratory: Negative for cough, hemoptysis, sputum production, shortness of breath and wheezing.   Cardiovascular: Negative for chest pain, palpitations, orthopnea and claudication.  Gastrointestinal: Negative for abdominal pain, blood in stool, constipation, diarrhea, heartburn, melena, nausea and vomiting.  Genitourinary: Negative for dysuria, flank pain, frequency, hematuria and  urgency.  Musculoskeletal: Negative for back pain, joint pain and myalgias.  Skin: Negative for rash.  Neurological: Negative for dizziness, tingling, focal weakness, seizures, weakness and headaches.    Endo/Heme/Allergies: Does not bruise/bleed easily.  Psychiatric/Behavioral: Negative for depression and suicidal ideas. The patient does not have insomnia.       Allergies  Allergen Reactions  . Epinephrine Other (See Comments)    Funny feeling, heart racing      Past Medical History:  Diagnosis Date  . Cancer [facility]) 2019   right sided ovarian cancer  . Hyperlipidemia   . Thyroid disease      Past Surgical History:  Procedure Laterality Date  . CHOLECYSTECTOMY  2008  . LAPAROSCOPIC VAGINAL HYSTERECTOMY WITH SALPINGO OOPHORECTOMY  02/15/2018  . PORTACATH PLACEMENT Left 02/25/2018   Procedure: INSERTION PORT-A-CATH;  Surgeon: Robert Bellow, MD;  Location: [facility] ORS;  Service: General;  Laterality: Left;  . TOTAL ABDOMINAL HYSTERECTOMY W/ BILATERAL SALPINGOOPHORECTOMY  02/15/2018   lymph nodes removed    Social History   Socioeconomic History  . Marital status: Widowed    Spouse name: Not on file  . Number of children: Not on file  . Years of education: Not on file  . Highest education level: Not on file  Occupational History  . Not on file  Social Needs  . Financial resource strain: Not on file  . Food insecurity:    Worry: Not on file    Inability: Not on file  . Transportation needs:    Medical: Not on file    Non-medical: Not on file  Tobacco Use  . Smoking status: Former Smoker    Packs/day: 1.00    Years: 25.00    Pack years: 25.00    Types: Cigarettes    Last attempt to quit: 02/26/1984    Years since quitting: 34.0  . Smokeless tobacco: Never Used  Substance and Sexual Activity  . Alcohol use: Never    Frequency: Never  . Drug use: Never  . Sexual activity: Not on file  Lifestyle  . Physical activity:    Days per week: Not on file    Minutes per session: Not on file  . Stress: Not on file  Relationships  . Social connections:    Talks on phone: Not on file    Gets together: Not on file    Attends religious service: Not on file    Active  member of club or organization: Not on file    Attends meetings of clubs or organizations: Not on file    Relationship status: Not on file  . Intimate partner violence:    Fear of current or ex partner: Not on file    Emotionally abused: Not on file    Physically abused: Not on file    Forced sexual activity: Not on file  Other Topics Concern  . Not on file  Social History Narrative  . Not on file    Family History  Problem Relation Age of Onset  . Sudden death Mother 25       drowning in hurricane Katrina  . Pancreatic cancer Father        deceased early 74s; smoker  . Coronary artery disease Brother   . Melanoma Sister        deceased at 50  . Prostate cancer Maternal Uncle        deceased 41  . Breast cancer Paternal Aunt        unsure of  this dx     Current Outpatient Medications:  .  OVER THE COUNTER MEDICATION, Obagi facial cleansing otc system - use as directed, Disp: , Rfl:  .  rosuvastatin (CRESTOR) 10 MG tablet, Take 10 mg by mouth every evening. , Disp: , Rfl:  .  SYNTHROID 175 MCG tablet, Take 175 mcg by mouth daily. , Disp: , Rfl:  .  VESICARE 5 MG tablet, Take 5 mg by mouth every evening. , Disp: , Rfl:  .  Vitamin D, Ergocalciferol, (DRISDOL) 50000 units CAPS capsule, Take 50,000 Units by mouth every Wednesday., Disp: , Rfl:  .  acetaminophen (TYLENOL) 500 MG tablet, Take 500 mg by mouth every 6 (six) hours as needed for moderate pain., Disp: , Rfl:  .  dexamethasone (DECADRON) 4 MG tablet, Take 2 tablets (8 mg total) by mouth daily. Start the day after chemotherapy for 2 days. (Patient not taking: Reported on 03/21/2018), Disp: 30 tablet, Rfl: 1 .  HYDROcodone-acetaminophen (NORCO/VICODIN) 5-325 MG tablet, Take 1 tablet by mouth every 4 (four) hours as needed for moderate pain. (Patient not taking: Reported on 02/25/2018), Disp: 10 tablet, Rfl: 0 .  lidocaine-prilocaine (EMLA) cream, Apply to affected area once (Patient not taking: Reported on 03/21/2018), Disp: 30  g, Rfl: 3 .  LORazepam (ATIVAN) 0.5 MG tablet, Take 1 tablet (0.5 mg total) by mouth every 6 (six) hours as needed (Nausea or vomiting). (Patient not taking: Reported on 03/21/2018), Disp: 30 tablet, Rfl: 0 .  ondansetron (ZOFRAN) 8 MG tablet, Take 1 tablet (8 mg total) by mouth 2 (two) times daily as needed for refractory nausea / vomiting. Start on day 3 after chemo. (Patient not taking: Reported on 03/21/2018), Disp: 30 tablet, Rfl: 1 .  prochlorperazine (COMPAZINE) 10 MG tablet, Take 1 tablet (10 mg total) by mouth every 6 (six) hours as needed (Nausea or vomiting). (Patient not taking: Reported on 03/21/2018), Disp: 30 tablet, Rfl: 1 No current facility-administered medications for this visit.   Facility-Administered Medications Ordered in Other Visits:  .  CARBOplatin (PARAPLATIN) 430 mg in sodium chloride 0.9 % 250 mL chemo infusion, 430 mg, Intravenous, Once, Sindy Guadeloupe, MD .  heparin lock flush 100 unit/mL, 500 Units, Intracatheter, Once PRN, Sindy Guadeloupe, MD .  PACLitaxel (TAXOL) 342 mg in sodium chloride 0.9 % 500 mL chemo infusion (> 29m/m2), 175 mg/m2 (Treatment Plan Recorded), Intravenous, Once, RSindy Guadeloupe MD .  pegfilgrastim (NEULASTA ONPRO KIT) injection 6 mg, 6 mg, Subcutaneous, Once, RSindy Guadeloupe MD .  sodium chloride flush (NS) 0.9 % injection 10 mL, 10 mL, Intracatheter, PRN, RSindy Guadeloupe MD, 10 mL at 03/21/18 0800  Physical exam:  Vitals:   03/21/18 0847  BP: 125/74  Pulse: 78  Resp: 18  Temp: 97.7 F (36.5 C)  TempSrc: Tympanic  SpO2: 96%  Weight: 183 lb 1.6 oz (83.1 kg)  Height: '5\' 6"'  (1.676 m)   Physical Exam  Constitutional: She is oriented to person, place, and time.  HENT:  Head: Normocephalic and atraumatic.  Eyes: Pupils are equal, round, and reactive to light. EOM are normal.  Neck: Normal range of motion.  Cardiovascular: Normal rate, regular rhythm and normal heart sounds.  Pulmonary/Chest: Effort normal and breath sounds normal.    Abdominal: Soft. Bowel sounds are normal.  Laparoscopy scar is healing well  Musculoskeletal: She exhibits edema (b/l +1).  Neurological: She is alert and oriented to person, place, and time.  Skin: Skin is warm and dry.  CMP Latest Ref Rng & Units 03/21/2018  Glucose 70 - 99 mg/dL 125(H)  BUN 8 - 23 mg/dL 24(H)  Creatinine 0.44 - 1.00 mg/dL 0.71  Sodium 135 - 145 mmol/L 145  Potassium 3.5 - 5.1 mmol/L 4.3  Chloride 98 - 111 mmol/L 110  CO2 22 - 32 mmol/L 25  Calcium 8.9 - 10.3 mg/dL 9.0  Total Protein 6.5 - 8.1 g/dL 6.4(L)  Total Bilirubin 0.3 - 1.2 mg/dL 0.5  Alkaline Phos 38 - 126 U/L 59  AST 15 - 41 U/L 20  ALT 0 - 44 U/L 17   CBC Latest Ref Rng & Units 03/21/2018  WBC 3.6 - 11.0 K/uL 6.3  Hemoglobin 12.0 - 16.0 g/dL 12.3  Hematocrit 35.0 - 47.0 % 37.0  Platelets 150 - 440 K/uL 253    No images are attached to the encounter.  Ct Chest W Contrast  Result Date: 02/27/2018 CLINICAL DATA:  Ovarian cancer, pT2a pN0, status post TAHBSO, pelvic lymphadenectomy and peritoneal and omental biopsies 02/15/2018. Chest staging. EXAM: CT CHEST WITH CONTRAST TECHNIQUE: Multidetector CT imaging of the chest was performed during intravenous contrast administration. CONTRAST:  31m ISOVUE-300 IOPAMIDOL (ISOVUE-300) INJECTION 61% COMPARISON:  02/25/2018 chest radiograph. FINDINGS: Cardiovascular: Normal heart size. No significant pericardial effusion/thickening. Left subclavian MediPort terminates at the cavoatrial junction. Atherosclerotic nonaneurysmal thoracic aorta. Normal caliber pulmonary arteries. No central pulmonary emboli. Mediastinum/Nodes: Thyroid is either atrophic or surgically absent. Unremarkable esophagus. No pathologically enlarged axillary, mediastinal or hilar lymph nodes. Lungs/Pleura: No pneumothorax. No pleural effusion. Diffuse bronchial wall thickening. Mildly thickened subpleural parenchymal band in the dependent right middle lobe with associated significant right middle  lobe volume loss. Thin parenchymal bands in the dependent basilar lower lobes bilaterally. Otherwise no acute consolidative airspace disease, lung masses or significant pulmonary nodules. Tiny calcified peripheral left upper lobe granuloma. Upper abdomen: Cholecystectomy. Suggestion of fullness of the limited visualized portions of the upper left renal collecting system. Musculoskeletal: No aggressive appearing focal osseous lesions. Moderate thoracic spondylosis. IMPRESSION: 1. No evidence of metastatic disease in the chest. 2. Parenchymal bands in the right middle lobe and basilar lower lobes with associated significant volume loss in the right middle lobe, most compatible with nonspecific postinfectious/postinflammatory scarring. 3. Diffuse bronchial wall thickening, nonspecific, which can be seen with reactive airways disease and/or chronic bronchitis. 4. Suggestion of fullness of the limited visualized portions of the upper left renal collecting system. Consider renal ultrasound correlation to evaluate for left hydronephrosis. Aortic Atherosclerosis (ICD10-I70.0). Electronically Signed   By: JIlona SorrelM.D.   On: 02/27/2018 15:27   Dg Chest Port 1 View  Result Date: 02/25/2018 CLINICAL DATA:  Status post Port-A-Cath appliance placement. EXAM: PORTABLE CHEST 1 VIEW COMPARISON:  None in PACs FINDINGS: The patient has undergone porta catheter placement via the left subclavian approach with the tip projecting at the junction of the middle and distal thirds of the SVC. The lungs are well-expanded. There is no pneumothorax or pleural effusion. Minimal subsegmental atelectasis or scarring at the left base is present. The heart and pulmonary vascularity are normal. The bony thorax is unremarkable. IMPRESSION: No postprocedure complication following porta catheter placement. Electronically Signed   By: David  JMartiniqueM.D.   On: 02/25/2018 09:47   Dg C-arm 1-60 Min-no Report  Result Date: 02/25/2018 Fluoroscopy  was utilized by the requesting physician.  No radiographic interpretation.     Assessment and plan- Patient is a 77y.o. female with stage IIA ovarian cancer pT2a pN0 cMx status post  surgery.  She is here for on treatment assessment prior to cycle #1 of carbotaxol  Counts are okay to proceed with cycle #1 of carboplatin AUC 5 along with Taxol 175 mg/m IV every 3 with on pro-Neulasta support today.  Patient is very keen to go back to [place] at this time and get her subsequent treatments there.  She does not have a good social support there and I did encourage her to rethink her decision as she may experience worsening fatigue or other side effects of chemotherapy with subsequent cycles.  Chemotherapy may also be associated with the risk of infections and hospitalization and it would be in her best interest to stay with her family here and complete her chemotherapy before she goes back to [place].  She would like to think about her options and get back to me.  I will see her back in 10 days time with a CBC and CMP to assess her tolerance to chemotherapy  I have also advised her to use Tylenol and Claritin if she were to develop any Neulasta associated bone pain  Genetic testing through [facility] has been sent today  Leg swelling- likely due to diet and rcent flight. Edema appears equal and symmetrical. Will hold off on doppler but if it worsens or if there is unilateral pain , redness- I will get a doppler to r/o DVT.  Patient was started on high-dose of vitamin D for vitamin D deficiency at [facility].  I will be repeating her vitamin D levels in 10 days time  I also reviewed the CT chest images independently and discussed findings with the patient.  There was no evidence of metastatic disease in the chest.  However fullness was noted in the left renal collecting system for which ultrasound was recommended.  I will therefore proceed with an ultrasound of her kidneys at this time  Patient will  need to set up for postoperative checkup with Dr. Gershon Crane office and we would likely get that set up here in [place] before she goes to [place].  I have also discussed her CODE STATUS and treatment preferences.  Patient had earlier stated that she would like to be DNR and did not want any intubation.  I discussed that a trial of intubation may be reasonable if she were to have any reversible cause such as Taxol reaction or worsening pneumonia which warrants intubation.  Patient would like to think about her options and get back to me at her next visit.  I have provided her MOST form at this time.   Visit Diagnosis 1. Encounter for antineoplastic chemotherapy   2. 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 Abnormal CT scan   4. Vitamin D deficiency      Dr. Randa Evens, MD, MPH [facility] [phone] 03/21/2018 11:12 AM

## 2018-03-21 NOTE — Progress Notes (Signed)
No new changes noted today 

## 2018-03-21 NOTE — Progress Notes (Signed)
1030- post-Benadryl, patient's legs are restless. Patient states, "My feet feel a little numb and my legs are itching some." MD, Dr. Janese Banks, notified. No new orders, continue to monitor.   1120- patient states, "I am no longer having any issues. My legs are back to normal."    1209- Patient states, "I am having sharp pain in my lower back that is radiating down to my hips and butt." Taxol stopped. Vital signs stable, see flowsheet. 0.9% Sodium Chloride infusing. NP, Beckey Rutter, notified and present at chair side by 1210. Benadryl, Solu-medrol, and Pepcid given, see MAR. Dr. Janese Banks present at chair side by 1214 and notified that patient was on Taxol titration first rate increase, which was running at rate of 57DU/KG per policy.  1214- patient states, "The pain is completely gone and I feel fine." Per Dr. Janese Banks order: restart Taxol titration at initial rate of 40ml/hr and continue Taxol titration per policy. Continue to monitor patient for any further signs/symptoms.  1235- Taxol restarted at 79ml/hr and titrated per policy.  1351- Patient states, "I feel like my breathing is a little shallow. I can't take a real good deep breath." Patient's bra loosened per patient request. Patient states, "It feels better now, but I still can't take a good deep breath." O2 saturation: 92% on room air. MD, Dr. Janese Banks, notified via telephone. Per MD order: continue infusing Taxol treatment. Recheck O2 saturation in 30 minutes.   1430- O2 saturation: 93% on room air. Patient states, "I have shallow breathing like this at home." Dr. Janese Banks present at chair side for further evaluation. Per MD statement: continue infusing Taxol, no new orders at this time.  Taxol infusion completed at 1630.

## 2018-03-22 ENCOUNTER — Other Ambulatory Visit: Payer: Self-pay | Admitting: *Deleted

## 2018-03-22 MED ORDER — SYNTHROID 175 MCG PO TABS: 175.0000 ug | ORAL_TABLET | Freq: Every day | ORAL | 0 refills | Status: DC

## 2018-03-22 MED ORDER — VITAMIN D (ERGOCALCIFEROL) 1.25 MG (50000 UNIT) PO CAPS: 50000.0000 [IU] | ORAL_CAPSULE | ORAL | 0 refills | Status: DC

## 2018-03-22 MED ORDER — ROSUVASTATIN CALCIUM 10 MG PO TABS: 10.0000 mg | ORAL_TABLET | Freq: Every evening | ORAL | 1 refills | Status: DC

## 2018-03-25 ENCOUNTER — Ambulatory Visit
Admission: RE | Admit: 2018-03-25 | Discharge: 2018-03-25 | Disposition: A | Discharge status: Home / Self Care | Payer: Medicare Other | Source: Ambulatory Visit | Attending: Oncology | Admitting: Oncology

## 2018-03-25 ENCOUNTER — Other Ambulatory Visit: Payer: Self-pay | Admitting: *Deleted

## 2018-03-25 DIAGNOSIS — N281 Cyst of kidney, acquired: Secondary | ICD-10-CM | POA: Diagnosis not present

## 2018-03-25 DIAGNOSIS — R9389 Abnormal findings on diagnostic imaging of other specified body structures: Secondary | ICD-10-CM | POA: Insufficient documentation

## 2018-03-25 MED ORDER — HYDROCODONE-ACETAMINOPHEN 5-325 MG PO TABS: 1.0000 | ORAL_TABLET | ORAL | 0 refills | Status: DC | PRN

## 2018-03-29 ENCOUNTER — Telehealth: Payer: Self-pay | Admitting: Genetic Counselor

## 2018-03-29 NOTE — Telephone Encounter (Signed)
Cancer Genetics             Telegenetics Results Disclosure   Patient Name: Melissa Anderson Patient DOB: 1941-04-14 Patient Age: 77 y.o. Phone Call Date: 03/29/2018  Referring Provider: Randa Evens, MD    Melissa Anderson was called today to discuss genetic test results. Please see the Genetics telephone note from 02/26/2018 for a detailed discussion of her personal and family histories and the recommendations provided. A detailed message was left for her regarding her results and she was asked to call back.   Genetic Testing: At the time of Melissa Anderson's telegenetics visit, she decided to pursue genetic testing of multiple genes associated with hereditary susceptibility to cancer. Testing included sequencing and deletion/duplication analysis. Testing did not reveal a pathogenic mutation in any of the genes analyzed.  A copy of the genetic test report will be scanned into Epic under the Media tab.  The genes analyzed were the 83 genes on [facility]'s Multi-Cancer panel (ALK, APC, ATM, AXIN2, BAP1, BARD1, BLM, BMPR1A, BRCA1, BRCA2, BRIP1, CASR, CDC73, CDH1, CDK4, CDKN1B, CDKN1C, CDKN2A, CEBPA, CHEK2, CTNNA1, DICER1, DIS3L2, EGFR, EPCAM, FH, FLCN, GATA2, GPC3, GREM1, HOXB13, HRAS, KIT, MAX, MEN1, MET, MITF, MLH1, MSH2, MSH3, MSH6, MUTYH, NBN, NF1, NF2, NTHL1, PALB2, PDGFRA, PHOX2B, PMS2, POLD1, POLE, POT1, PRKAR1A, PTCH1, PTEN, RAD50, RAD51C, RAD51D, RB1, RECQL4, RET, RUNX1, SDHA, SDHAF2, SDHB, SDHC, SDHD, SMAD4, SMARCA4, SMARCB1, SMARCE1, STK11, SUFU, TERC, TERT, TMEM127, TP53, TSC1, TSC2, VHL, WRN, WT1).  Since the current test is not perfect, it is possible that there may be a gene mutation that current testing cannot detect, but that chance is small. It is possible that a different genetic factor, which has not yet been discovered or is not on this panel, is responsible for the cancer diagnoses in the family. Again, the likelihood of this is low. No additional testing is  recommended at this time for Melissa Anderson.  Cancer Screening: These results suggest that Melissa Anderson ovarian cancer was most likely not due to an inherited predisposition. Most cancers happen by chance and this test, along with details of her family history, suggests that her cancer falls into this category. She is recommended to follow the cancer screening guidelines provided by her physician.   Family Members: Family members are at some increased risk of developing cancer, over the general population risk, simply due to the family history. Women are recommended to have a yearly mammogram beginning at age 6, a yearly clinical breast exam, a yearly gynecologic exam and perform monthly breast self-exams. Colon cancer screening is recommended to begin by age 97 in both men and women, unless there is a family history of colon cancer or colon polyps or an individual has a personal history to warrant initiating screening at a younger age.  Any relative who had cancer at a young age or had a particularly rare cancer may also wish to pursue genetic testing. Genetic counselors can be located in other cities, by visiting the website of the [employer] ([employer]ist[employer]se) and Field seismologist for a Dietitian by zip code.   Lastly, cancer genetics is a rapidly advancing field and it is possible that new genetic tests will be appropriate for Melissa Anderson in the future. Melissa Anderson is encouraged to remain in contact with Genetics on an annual basis so we can update her personal and family histories, and  let her know of advances in cancer genetics that may benefit the family. Melissa Anderson questions were answered to her satisfaction today, and she knows she is welcome to call anytime with additional questions.    Steele Berg, MS, [place] Certified Genetic Counselor phone: [phone]

## 2018-04-02 ENCOUNTER — Encounter: Payer: Self-pay | Admitting: Oncology

## 2018-04-02 ENCOUNTER — Inpatient Hospital Stay: Payer: Medicare Other | Attending: Oncology | Admitting: Oncology

## 2018-04-02 ENCOUNTER — Inpatient Hospital Stay: Payer: Medicare Other

## 2018-04-02 VITALS — BP 111/74 | HR 88 | Temp 98.4°F | Resp 18 | Ht 66.0 in | Wt 182.9 lb

## 2018-04-02 DIAGNOSIS — Z90710 Acquired absence of both cervix and uterus: Secondary | ICD-10-CM | POA: Insufficient documentation

## 2018-04-02 DIAGNOSIS — Z79899 Other long term (current) drug therapy: Secondary | ICD-10-CM | POA: Diagnosis not present

## 2018-04-02 DIAGNOSIS — Z90722 Acquired absence of ovaries, bilateral: Secondary | ICD-10-CM | POA: Insufficient documentation

## 2018-04-02 DIAGNOSIS — Z87891 Personal history of nicotine dependence: Secondary | ICD-10-CM | POA: Insufficient documentation

## 2018-04-02 DIAGNOSIS — C561 Malignant neoplasm of right ovary: Secondary | ICD-10-CM

## 2018-04-02 DIAGNOSIS — C569 Malignant neoplasm of unspecified ovary: Secondary | ICD-10-CM | POA: Diagnosis not present

## 2018-04-02 DIAGNOSIS — T451X5A Adverse effect of antineoplastic and immunosuppressive drugs, initial encounter: Secondary | ICD-10-CM | POA: Insufficient documentation

## 2018-04-02 DIAGNOSIS — Z7689 Persons encountering health services in other specified circumstances: Secondary | ICD-10-CM | POA: Diagnosis not present

## 2018-04-02 DIAGNOSIS — M79604 Pain in right leg: Secondary | ICD-10-CM | POA: Diagnosis not present

## 2018-04-02 DIAGNOSIS — Z5111 Encounter for antineoplastic chemotherapy: Secondary | ICD-10-CM | POA: Insufficient documentation

## 2018-04-02 DIAGNOSIS — G620 Drug-induced polyneuropathy: Secondary | ICD-10-CM | POA: Insufficient documentation

## 2018-04-02 DIAGNOSIS — E559 Vitamin D deficiency, unspecified: Secondary | ICD-10-CM

## 2018-04-02 LAB — COMPREHENSIVE METABOLIC PANEL
ALBUMIN: 3.5 g/dL (ref 3.5–5.0)
ALT: 27 U/L (ref 0–44)
AST: 22 U/L (ref 15–41)
Alkaline Phosphatase: 88 U/L (ref 38–126)
Anion gap: 7 (ref 5–15)
BILIRUBIN TOTAL: 0.4 mg/dL (ref 0.3–1.2)
BUN: 31 mg/dL — AB (ref 8–23)
CO2: 26 mmol/L (ref 22–32)
Calcium: 9.4 mg/dL (ref 8.9–10.3)
Chloride: 105 mmol/L (ref 98–111)
Creatinine, Ser: 0.7 mg/dL (ref 0.44–1.00)
GFR calc Af Amer: >60 mL/min (ref >60)
GFR calc non Af Amer: >60 mL/min (ref >60)
GLUCOSE: 93 mg/dL (ref 70–99)
POTASSIUM: 5 mmol/L (ref 3.5–5.1)
Sodium: 138 mmol/L (ref 135–145)
TOTAL PROTEIN: 6.6 g/dL (ref 6.5–8.1)

## 2018-04-02 LAB — CBC WITH DIFFERENTIAL/PLATELET
BASOS ABS: 0.1 10*3/uL (ref 0–0.1)
Basophils Relative: 1 %
EOS PCT: 1 %
Eosinophils Absolute: 0.1 10*3/uL (ref 0–0.7)
HCT: 35.7 % (ref 35.0–47.0)
Hemoglobin: 11.9 g/dL — ABNORMAL LOW (ref 12.0–16.0)
LYMPHS ABS: 1.4 10*3/uL (ref 1.0–3.6)
LYMPHS PCT: 10 %
MCH: 30.8 pg (ref 26.0–34.0)
MCHC: 33.2 g/dL (ref 32.0–36.0)
MCV: 92.7 fL (ref 80.0–100.0)
MONO ABS: 0.7 10*3/uL (ref 0.2–0.9)
MONOS PCT: 5 %
Neutro Abs: 11.9 10*3/uL — ABNORMAL HIGH (ref 1.4–6.5)
Neutrophils Relative %: 83 %
PLATELETS: 240 10*3/uL (ref 150–440)
RBC: 3.85 MIL/uL (ref 3.80–5.20)
RDW: 13.9 % (ref 11.5–14.5)
WBC: 14.3 10*3/uL — ABNORMAL HIGH (ref 3.6–11.0)

## 2018-04-02 NOTE — Progress Notes (Signed)
Hematology/Oncology Consult note [facility]  [phone] [phone]  Patient Care Team: System, Pcp Not In as PCP - General   Name of the patient: Melissa Anderson  [id number]  1941-06-23   Date of visit: 04/02/18  Diagnosis- Stage II ovarian cancer   Chief complaint/ Reason for visit-toxicity check after cycle 1 of carbotaxol  Heme/Onc history: patient is a 77 year old female with a past medical history significant for hyperlipidemia and Graves' disease. She was recently complaining of back pain which led to an MRI of the lumbosacral spine. MRI in March 2019 which incidentally showed a multicystic 4 x 5.5 cm right adnexal mass with blood fluid levels. Prominent uterine endometrial stripe.  This was followed by a pelvic ultrasound in April 2019 which showed uterus measuring 9.7 x 4.1 x 5.9 cm. Multiloculated complex cystic structure in the right adnexa measuring 6.3 x 6.1 x 4.7 cm. It has solid-appearing components as well as anechoic portions separated by thick septations. No normal ovarian parenchyma is identified. No pelvic free fluid. Left ovary was not seen.  This was not further evaluated until patient's met with her gynecologist and was then referred to gynecology oncology Dr. Carlton Adam at [facility]. Patient is Melissa Anderson mother and wanted to get further care here at [facility] and was seen by Dr. Theora Gianotti on 02/12/2018.  She had a CT scan of her abdomen and pelvis on 02/09/2018 which showed multiseptated complex cystic lesion in the right pelvis. At adnexa immediately superior to the uterus measuring 6.9 x 5.3 cm. Abnormal thickening of the endometrial cavity measuring 1.3 cm. 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 Tumor is positive for PAX 8, WT 1 and p53. 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 3 lymph nodes negative for malignancy on the right and the left side. Omental biopsy was negative. Peritoneal biopsy was negative for malignancy. pT2a pN0  Baseline tumor markers including CA125 normal at 18  Cycle 1 of chemotherapy was given on 03/21/2018  Interval history-patient continues to have right lower extremity pain radiating from her hip down which has been ongoing even prior to the start of chemotherapy and has not gotten better.  She tolerated cycle 1 of chemotherapy well but she did have some Neulasta associated bone pain which did not get better with Tylenol and Claritin and she had to take some as needed Percocet for it.  She did not have any significant nausea.  She did report some tingling numbness soles after her first cycle which is mild and does not interfere with her day-to-day activities.  ECOG PS- 1 Pain scale- 0 Opioid associated constipation- no  Review of systems- Review of Systems  Constitutional: Negative for chills, fever, malaise/fatigue and weight loss.  HENT: Negative for congestion, ear discharge and nosebleeds.   Eyes: Negative for blurred vision.  Respiratory: Negative for cough, hemoptysis, sputum production, shortness of breath and wheezing.   Cardiovascular: Negative for chest pain, palpitations, orthopnea and claudication.  Gastrointestinal: Negative for abdominal pain, blood in stool, constipation, diarrhea, heartburn, melena, nausea and vomiting.  Genitourinary: Negative for dysuria, flank pain, frequency, hematuria  and urgency.  Musculoskeletal: Negative for back pain, joint pain and myalgias.       Right lower  extremity pain  Skin: Negative for rash.  Neurological: Negative for dizziness, tingling, focal weakness, seizures, weakness and headaches.  Endo/Heme/Allergies: Does not bruise/bleed easily.  Psychiatric/Behavioral: Negative for depression and suicidal ideas. The patient does not have insomnia.        Allergies  Allergen Reactions  . Epinephrine Other (See Comments)    Funny feeling, heart racing      Past Medical History:  Diagnosis Date  . Cancer [facility]) 2019   right sided ovarian cancer  . Hyperlipidemia   . Thyroid disease      Past Surgical History:  Procedure Laterality Date  . CHOLECYSTECTOMY  2008  . LAPAROSCOPIC VAGINAL HYSTERECTOMY WITH SALPINGO OOPHORECTOMY  02/15/2018  . PORTACATH PLACEMENT Left 02/25/2018   Procedure: INSERTION PORT-A-CATH;  Surgeon: Robert Bellow, MD;  Location: [facility] ORS;  Service: General;  Laterality: Left;  . TOTAL ABDOMINAL HYSTERECTOMY W/ BILATERAL SALPINGOOPHORECTOMY  02/15/2018   lymph nodes removed    Social History   Socioeconomic History  . Marital status: Widowed    Spouse name: Not on file  . Number of children: Not on file  . Years of education: Not on file  . Highest education level: Not on file  Occupational History  . Not on file  Social Needs  . Financial resource strain: Not on file  . Food insecurity:    Worry: Not on file    Inability: Not on file  . Transportation needs:    Medical: Not on file    Non-medical: Not on file  Tobacco Use  . Smoking status: Former Smoker    Packs/day: 1.00    Years: 25.00    Pack years: 25.00    Types: Cigarettes    Last attempt to quit: 02/26/1984    Years since quitting: 34.1  . Smokeless tobacco: Never Used  Substance and Sexual Activity  . Alcohol use: Never    Frequency: Never  . Drug use: Never  . Sexual activity: Not on file  Lifestyle  . Physical activity:    Days per week: Not on file    Minutes per session: Not on file  . Stress: Not on file    Relationships  . Social connections:    Talks on phone: Not on file    Gets together: Not on file    Attends religious service: Not on file    Active member of club or organization: Not on file    Attends meetings of clubs or organizations: Not on file    Relationship status: Not on file  . Intimate partner violence:    Fear of current or ex partner: Not on file    Emotionally abused: Not on file    Physically abused: Not on file    Forced sexual activity: Not on file  Other Topics Concern  . Not on file  Social History Narrative  . Not on file    Family History  Problem Relation Age of Onset  . Sudden death Mother 33       drowning in hurricane Katrina  . Pancreatic cancer Father        deceased early 98s; smoker  . Coronary artery disease Brother   . Melanoma Sister        deceased at 51  . Prostate cancer Maternal Uncle        deceased 74  . Breast  cancer Paternal Aunt        unsure of this dx     Current Outpatient Medications:  .  rosuvastatin (CRESTOR) 10 MG tablet, Take 1 tablet (10 mg total) by mouth every evening., Disp: 30 tablet, Rfl: 1 .  SYNTHROID 175 MCG tablet, Take 1 tablet (175 mcg total) by mouth daily., Disp: 30 tablet, Rfl: 0 .  VESICARE 5 MG tablet, Take 5 mg by mouth every evening. , Disp: , Rfl:  .  Vitamin D, Ergocalciferol, (DRISDOL) 50000 units CAPS capsule, Take 1 capsule (50,000 Units total) by mouth every Wednesday., Disp: 4 capsule, Rfl: 0 .  acetaminophen (TYLENOL) 500 MG tablet, Take 500 mg by mouth every 6 (six) hours as needed for moderate pain., Disp: , Rfl:  .  dexamethasone (DECADRON) 4 MG tablet, Take 2 tablets (8 mg total) by mouth daily. Start the day after chemotherapy for 2 days. (Patient not taking: Reported on 03/21/2018), Disp: 30 tablet, Rfl: 1 .  HYDROcodone-acetaminophen (NORCO/VICODIN) 5-325 MG tablet, Take 1 tablet by mouth every 4 (four) hours as needed for moderate pain. (Patient not taking: Reported on 04/02/2018), Disp: 45  tablet, Rfl: 0 .  lidocaine-prilocaine (EMLA) cream, Apply to affected area once (Patient not taking: Reported on 03/21/2018), Disp: 30 g, Rfl: 3 .  LORazepam (ATIVAN) 0.5 MG tablet, Take 1 tablet (0.5 mg total) by mouth every 6 (six) hours as needed (Nausea or vomiting). (Patient not taking: Reported on 03/21/2018), Disp: 30 tablet, Rfl: 0 .  ondansetron (ZOFRAN) 8 MG tablet, Take 1 tablet (8 mg total) by mouth 2 (two) times daily as needed for refractory nausea / vomiting. Start on day 3 after chemo. (Patient not taking: Reported on 03/21/2018), Disp: 30 tablet, Rfl: 1 .  OVER THE COUNTER MEDICATION, Obagi facial cleansing otc system - use as directed, Disp: , Rfl:  .  prochlorperazine (COMPAZINE) 10 MG tablet, Take 1 tablet (10 mg total) by mouth every 6 (six) hours as needed (Nausea or vomiting). (Patient not taking: Reported on 03/21/2018), Disp: 30 tablet, Rfl: 1  Physical exam:  Vitals:   04/02/18 0939  BP: 111/74  Pulse: 88  Resp: 18  Temp: 98.4 F (36.9 C)  TempSrc: Tympanic  SpO2: 95%  Weight: 182 lb 14.4 oz (83 kg)  Height: '5\' 6"'  (1.676 m)   Physical Exam  Constitutional: She is oriented to person, place, and time. She appears well-developed and well-nourished.  HENT:  Head: Normocephalic and atraumatic.  Eyes: Pupils are equal, round, and reactive to light. EOM are normal.  Neck: Normal range of motion.  Cardiovascular: Normal rate, regular rhythm and normal heart sounds.  Pulmonary/Chest: Effort normal and breath sounds normal.  Abdominal: Soft. Bowel sounds are normal.  Musculoskeletal: She exhibits edema (Trace bilateral).  Neurological: She is alert and oriented to person, place, and time.  Skin: Skin is warm and dry.     CMP Latest Ref Rng & Units 04/02/2018  Glucose 70 - 99 mg/dL 93  BUN 8 - 23 mg/dL 31(H)  Creatinine 0.44 - 1.00 mg/dL 0.70  Sodium 135 - 145 mmol/L 138  Potassium 3.5 - 5.1 mmol/L 5.0  Chloride 98 - 111 mmol/L 105  CO2 22 - 32 mmol/L 26  Calcium  8.9 - 10.3 mg/dL 9.4  Total Protein 6.5 - 8.1 g/dL 6.6  Total Bilirubin 0.3 - 1.2 mg/dL 0.4  Alkaline Phos 38 - 126 U/L 88  AST 15 - 41 U/L 22  ALT 0 - 44 U/L 27  CBC Latest Ref Rng & Units 04/02/2018  WBC 3.6 - 11.0 K/uL 14.3(H)  Hemoglobin 12.0 - 16.0 g/dL 11.9(L)  Hematocrit 35.0 - 47.0 % 35.7  Platelets 150 - 440 K/uL 240    No images are attached to the encounter.  US Renal  Result Date: 03/25/2018 CLINICAL DATA:  Abnormal CT scan. EXAM: RENAL / URINARY TRACT ULTRASOUND COMPLETE COMPARISON:  CT scan of February 27, 2018. FINDINGS: Right Kidney: Length: 11.3 cm. Multiple parapelvic cysts are noted with the largest measuring 2 cm. Echogenicity within normal limits. No mass or hydronephrosis visualized. Left Kidney: Length: 12 cm. Multiple parapelvic cysts are noted with the largest measuring 2.5 cm. Echogenicity within normal limits. No hydronephrosis visualized. 6 mm hyperechoic focus is noted in midpole cortex most consistent with renal angiomyolipoma. Bladder: Appears normal for degree of bladder distention. IMPRESSION: Multiple parapelvic simple cysts are noted bilaterally. 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 No hydronephrosis or renal obstruction is noted. Electronically Signed   By: Marijo Conception, M.D.   On: 03/25/2018 08:53     Assessment and plan- Patient is a 77 y.o. female with stage IIA ovarian cancer pT2a pN0 cMx status post surgery.  She is status post 1 cycle of carboplatin and Taxol chemotherapy and is here for toxicity check 10 days post chemo  Overall she has tolerated chemotherapy well.  She does report some mild tingling numbness in her sole of the right foot which we will continue to monitor.  Her white count was mildly elevated at 14.3 with predominant neutrophilia likely secondary to Neulasta.  She is planning to go back to [place] for 10 days and will return on 04/15/2018 and we will plan to give her cycle 2 of chemotherapy on 04/16/2018.  I will see her  on that day with CBC and CMP.  She sees Dr. Fransisca Connors for postoperative check tomorrow.  Genetic testing did not reveal any significant genetic mutations.  We had also done a renal ultrasound due to fullness in her pelvis noted on CT chest ultrasound showed simple renal cysts and a small angiomyolipoma.  Right lower extremity pain: This has been ongoing for the last few months and her ovarian cancer was incidentally found when she had an MRI for this pain.  I have asked her to obtain disks of her MRI and I will refer her to neurology Dr. Melrose Nakayama at this point as well  She has mild bilateral lower extremity edema which is overall improved as compared to her prior visit.  Continue to monitor     Visit Diagnosis 1. Carcinoma of right ovary ([place])   2. Pain of right lower extremity      Dr. Randa Evens, MD, MPH [facility] [phone] 04/02/2018 1:59 PM

## 2018-04-02 NOTE — Progress Notes (Signed)
No new changes noted today 

## 2018-04-03 ENCOUNTER — Inpatient Hospital Stay (HOSPITAL_BASED_OUTPATIENT_CLINIC_OR_DEPARTMENT_OTHER): Payer: Medicare Other | Admitting: Obstetrics and Gynecology

## 2018-04-03 VITALS — BP 106/71 | HR 89 | Temp 98.6°F | Resp 18 | Ht 66.0 in | Wt 182.1 lb

## 2018-04-03 DIAGNOSIS — C569 Malignant neoplasm of unspecified ovary: Secondary | ICD-10-CM

## 2018-04-03 DIAGNOSIS — Z90722 Acquired absence of ovaries, bilateral: Secondary | ICD-10-CM

## 2018-04-03 DIAGNOSIS — C561 Malignant neoplasm of right ovary: Secondary | ICD-10-CM

## 2018-04-03 DIAGNOSIS — Z90710 Acquired absence of both cervix and uterus: Secondary | ICD-10-CM

## 2018-04-03 LAB — VITAMIN D 25 HYDROXY (VIT D DEFICIENCY, FRACTURES): VIT D 25 HYDROXY: 24.8 ng/mL — AB (ref 30.0–100.0)

## 2018-04-03 NOTE — Progress Notes (Signed)
Gynecologic Oncology Consult Visit   Referring Provider: Dr Mike Gip  Chief Complaint: stage II ovarian cancer  Subjective:  Melissa Anderson is a 77 y.o. 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   On 02/15/18, she underwent TLH, BSO, with bilateral pelvic LN dissection, peritoneal biopsies, and omental biopsy with Dr. Unknown Foley at [facility]. No spread beyond tube and ovaries.  She was discharged on POD1 w/o complication.    Pathology A. 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  See the synoptic report. Note: The tumor is positive for PAX8, WT1, and p53 (strong, uniform). Based on the presence of tubal intraepithelial carcinoma, this is considered a tubal primary.  B.  Endometrium, curettage: Fragments of endometrial polyp.  C. Uterus, total hysterectomy:   Uterus:    Endometrium: Endometrial polyp with focal glandular crowding, see note.    Myometrium: Adenomyosis, leiomyomata (up to 1 cm).    Cervix: No pathologic diagnosis.     Serosa: No pathologic diagnosis.  Note: The areas of endometrial glandular crowding are not cytologically distinct from the background and therefore do not meet the criteria for endometrial intraepithelial neoplasia (EIN).  D. Right pelvic lymph nodes, lymphadenectomy: Three lymph nodes, negative for malignancy (0/3).   E. Left pelvic lymph nodes, lymphadenectomy:   Three lymph nodes, negative for malignancy (0/3).   F. Omentum, omentectomy:  Benign omentum.  G. Peritoneal biopsy #1:  Benign fibromuscular tissue.  H. Peritoneal biopsy #2: Benign fibrous tissue.  I. Peritoneal biopsy #3:  Benign fibroadipose tissue.  J. Peritoneal biopsy #4:  Benign fibroadipose  tissue.  A. Pelvic Washings: - Negative. No Evidence of Malignancy  She was referred to Dr. Janese Banks, medical-oncology at [facility] and initiated cycle 1 of adjuvant carbo-taxol on 03/21/18.   Genetic Testing:  - [employer] 83 gene Multi-Cancer panel was negative for pathogenic mutation in any genes. No VUS detected.   Today, she reports overall feeling well. She has some bilateral lower extremity edema. BMs are regular. She denies pain. Appetite is normal, mild weight loss. Denies nausea. No concerns today.   Gynecologic Oncology History:  She was at her PCP office in March and was complaining of intermittent weakness and pain shooting down her right leg. Her PCP ordered an MRI of her lumbosacral spine which demonstrated this right adnexal mass as an incidental finding.   Radiologic Imaging: 11/12/2017 Lumbosacral spine MRI scan noted a multicystic 4 x 5.5 cm right adnexal mass with fluid blood levels. Prominent uterine endometrial stripe.   12/20/2017 Pelvic Ultrasound: Uterus measures 9.7 x 4.1 x 5.9cm and is retroverted. Endometrial stripe measures 9 mm. Within it, as outlines by a small amount of fluid, there is a 1.4 x 0.7 x 1.4 cm polypoid echogenic structure with internal blood flow and small cystic components. A similar appearing but smaller 5 x 4 x 3 mm echogenic nodular density is noted within the endometrial cavity as well. The uterus is otherwise normal in appearance. There is a multiloculated complex cystic structure in the right adnexa measuring 6.3 x 6.1 x 4.7 cm. This has solid appearing components as well as anechoic portions, separated by thick septations. On MRI, many of these loculations demonstrate fluid-filled levels. No normal ovarian parenchyma is identified. The left ovary was not seen. There is no pelvic  free fluid.  She subsequently underwent an EMB with her gynecologist and this was negative.   She apparently was not aware that she had a cyst this size until she was seen by her  provider who recommended Gyn Oncology consultation for further evaluation. She was seen by Dr. Carlton Adam who recommended surgery and further imaging and tumor markers.   02/05/2018: CA125 = 18, CA19-9 = 8, CEA <0.5, OVA1 = 4 (low risk)  02/09/2018 CT Scan A/P: multi-septated complex cystic lesion noted in the right pelvis.adnexa immediately superior to the uterus which measures approximately 6.9 x 5.3 am in greatest axial dimensions and approximately 5.4 cm in greatest cranial caudal dimensions.  Abnormal thickening of the endometrial cavity which measures 1.3 cm in greatest width.  Multiple peripelvic cysts are identified in bilateral renal hilar regions measuring up to 3.6 cm on the right and 2.9 cm on the left.   Problem List: Patient Active Problem List   Diagnosis Date Noted  . Goals of care, counseling/discussion 02/25/2018  . Ovarian carcinoma ([place]) 02/22/2018    Past Medical History: Past Medical History:  Diagnosis Date  . Cancer [facility]) 2019   right sided ovarian cancer  . Hyperlipidemia   . Thyroid disease     Past Surgical History: Past Surgical History:  Procedure Laterality Date  . CHOLECYSTECTOMY  2008  . LAPAROSCOPIC VAGINAL HYSTERECTOMY WITH SALPINGO OOPHORECTOMY  02/15/2018  . PORTACATH PLACEMENT Left 02/25/2018   Procedure: INSERTION PORT-A-CATH;  Surgeon: Robert Bellow, MD;  Location: [facility] ORS;  Service: General;  Laterality: Left;  . TOTAL ABDOMINAL HYSTERECTOMY W/ BILATERAL SALPINGOOPHORECTOMY  02/15/2018   lymph nodes removed    OB History:  OB History  Gravida Para Term Preterm AB Living  2         2  SAB TAB Ectopic Multiple Live Births               # Outcome Date GA Lbr Len/2nd Weight Sex Delivery Anes PTL Lv  2 Gravida           1 Gravida             Obstetric Comments  Menstrual age: 33    Age 1st Pregnancy: 28    Family History: Family History  Problem Relation Age of Onset  . Sudden death Mother 85       drowning in hurricane Katrina   . Pancreatic cancer Father        deceased early 27s; smoker  . Coronary artery disease Brother   . Melanoma Sister        deceased at 71  . Prostate cancer Maternal Uncle        deceased 18  . Breast cancer Paternal Aunt        unsure of this dx    Social History: Social History   Socioeconomic History  . Marital status: Widowed    Spouse name: Not on file  . Number of children: Not on file  . Years of education: Not on file  . Highest education level: Not on file  Occupational History  . Not on file  Social Needs  . Financial resource strain: Not on file  . Food insecurity:    Worry: Not on file    Inability: Not on file  . Transportation needs:    Medical: Not on file    Non-medical: Not on file  Tobacco Use  . Smoking status: Former Smoker    Packs/day: 1.00  Years: 25.00    Pack years: 25.00    Types: Cigarettes    Last attempt to quit: 02/26/1984    Years since quitting: 34.1  . Smokeless tobacco: Never Used  Substance and Sexual Activity  . Alcohol use: Never    Frequency: Never  . Drug use: Never  . Sexual activity: Not on file  Lifestyle  . Physical activity:    Days per week: Not on file    Minutes per session: Not on file  . Stress: Not on file  Relationships  . Social connections:    Talks on phone: Not on file    Gets together: Not on file    Attends religious service: Not on file    Active member of club or organization: Not on file    Attends meetings of clubs or organizations: Not on file    Relationship status: Not on file  . Intimate partner violence:    Fear of current or ex partner: Not on file    Emotionally abused: Not on file    Physically abused: Not on file    Forced sexual activity: Not on file  Other Topics Concern  . Not on file  Social History Narrative  . Not on file    Allergies: Allergies  Allergen Reactions  . Epinephrine Other (See Comments)    Funny feeling, heart racing     Current Medications: Current  Outpatient Medications  Medication Sig Dispense Refill  . acetaminophen (TYLENOL) 500 MG tablet Take 500 mg by mouth every 6 (six) hours as needed for moderate pain.    Marland Kitchen dexamethasone (DECADRON) 4 MG tablet Take 2 tablets (8 mg total) by mouth daily. Start the day after chemotherapy for 2 days. 30 tablet 1  . HYDROcodone-acetaminophen (NORCO/VICODIN) 5-325 MG tablet Take 1 tablet by mouth every 4 (four) hours as needed for moderate pain. 45 tablet 0  . lidocaine-prilocaine (EMLA) cream Apply to affected area once 30 g 3  . LORazepam (ATIVAN) 0.5 MG tablet Take 1 tablet (0.5 mg total) by mouth every 6 (six) hours as needed (Nausea or vomiting). 30 tablet 0  . ondansetron (ZOFRAN) 8 MG tablet Take 1 tablet (8 mg total) by mouth 2 (two) times daily as needed for refractory nausea / vomiting. Start on day 3 after chemo. 30 tablet 1  . OVER THE COUNTER MEDICATION Obagi facial cleansing otc system - use as directed    . prochlorperazine (COMPAZINE) 10 MG tablet Take 1 tablet (10 mg total) by mouth every 6 (six) hours as needed (Nausea or vomiting). 30 tablet 1  . rosuvastatin (CRESTOR) 10 MG tablet Take 1 tablet (10 mg total) by mouth every evening. 30 tablet 1  . SYNTHROID 175 MCG tablet Take 1 tablet (175 mcg total) by mouth daily. 30 tablet 0  . VESICARE 5 MG tablet Take 5 mg by mouth every evening.     . Vitamin D, Ergocalciferol, (DRISDOL) 50000 units CAPS capsule Take 1 capsule (50,000 Units total) by mouth every Wednesday. 4 capsule 0   No current facility-administered medications for this visit.     Review of Systems General: negative for fevers, chills, fatigue, changes in sleep, changes in weight or appetite Skin: negative for changes in color, texture, moles or lesions Eyes: negative for changes in vision, pain, diplopia HEENT: negative for change in hearing, pain, discharge, tinnitus, vertigo, voice changes, sore throat, neck masses Breasts: negative for breast lumps Pulmonary:  negative for dyspnea, orthopnea, productive cough Cardiac: negative  for palpitations, syncope, pain, discomfort, pressure Gastrointestinal: negative for dysphagia, nausea, vomiting, jaundice, pain, constipation, diarrhea, hematemesis, hematochezia Genitourinary/Sexual: negative for dysuria, discharge, hesitancy, nocturia, retention, stones, infections, STD's, incontinence Ob/Gyn: negative for irregular bleeding, pain Musculoskeletal: negative for pain, stiffness, swelling, range of motion limitation Hematology: negative for easy bruising, bleeding Neurologic/Psych: negative for headaches, seizures, paralysis, weakness, tremor, change in gait, change in sensation, mood swings, depression, anxiety, change in memory   Objective:  Physical Examination:  BP 106/71   Pulse 89   Temp 98.6 F (37 C) (Tympanic)   Resp 18   Ht '5\' 6"'  (1.676 m)   Wt 182 lb 1.6 oz (82.6 kg)   BMI 29.39 kg/m     ECOG Performance Status: 0 - Asymptomatic  GENERAL: Patient is a well appearing female in no acute distress HEENT:  Sclerae anicteric.  Oropharynx clear and moist. No ulcerations or evidence of oropharyngeal candidiasis. Neck is supple.  NODES:  No cervical, supraclavicular, or axillary lymphadenopathy palpated.  LUNGS:  Clear to auscultation bilaterally.  No wheezes or rhonchi. HEART:  Regular rate and rhythm. No murmur appreciated. ABDOMEN:  Soft, nontender.  Positive, normoactive bowel sounds. No organomegaly palpated. Surgical incisions healing well.  MSK:  No focal spinal tenderness to palpation. Full range of motion bilaterally in the upper extremities. EXTREMITIES:  1+ BLE SKIN:  Clear with no obvious rashes or skin changes. No nail dyscrasia. NEURO:  Nonfocal. Well oriented.  Appropriate affect.  Pelvic: Exam Chaperoned by RN EGBUS: no lesions, Vagina: no lesions, no discharge or bleeding. Vaginal Cuff healing well. Rectovaginal: deferred.      Assessment:  Melissa Anderson is a 77 y.o.  female diagnosed with stage IIA high grade serous adenocarcinoma of the ovary, s/p TLH BSO, bilateral pelvic lymphadenectomy and omental/peritoneal biopsies on 02/15/18 at [place] with Dr. Unknown Foley. She has initiated chemotherapy with carbo-taxol and is treated by Dr. Janese Banks at [facility].   She has completed germline genetic testing which was negative for mutation.   Medical co-morbidities complicating care: BMI of 29.41 Plan:   Problem List Items Addressed This Visit      Genitourinary   Ovarian carcinoma ([place]) - Primary     We discussed her surgery and pathology again today and reinforced conversations previously had with Dr. Theora Gianotti, Dr. Unknown Foley, and Dr. Gerilyn Nestle at [facility]. She is recovering well from surgery without significant complication. We discussed that five year OS survival for stage IIA is 70%. Her case was previously discussed at [facility].   Somatic testing was not recommended at this time because she does not have a BRCA mutation and is not a candidate for maintenance PARP inhibitor after completing primary chemotherapy.  Somatic testing for mutation and HRD can be considered should she have recurrence.   She will return to clinic after completing chemotherapy for re-evaluation.   Beckey Rutter, DNP, AGNP-C [place] at [facility] [phone] (work cell) [phone] (office)  I personally interviewed and examined the patient. Agreed with the above/below plan of care. Patient/family questions were answered.  Mellody Drown, MD   CC:  Dr. Janese Banks

## 2018-04-03 NOTE — Progress Notes (Signed)
Pt has a stitch still in she states,,she also has some leg swelling.

## 2018-04-11 ENCOUNTER — Other Ambulatory Visit: Payer: Self-pay | Admitting: Oncology

## 2018-04-13 ENCOUNTER — Other Ambulatory Visit: Payer: Self-pay | Admitting: Oncology

## 2018-04-16 ENCOUNTER — Encounter: Payer: Self-pay | Admitting: Oncology

## 2018-04-16 ENCOUNTER — Inpatient Hospital Stay (HOSPITAL_BASED_OUTPATIENT_CLINIC_OR_DEPARTMENT_OTHER): Payer: Medicare Other | Admitting: Oncology

## 2018-04-16 ENCOUNTER — Inpatient Hospital Stay: Payer: Medicare Other

## 2018-04-16 ENCOUNTER — Other Ambulatory Visit: Payer: Self-pay | Admitting: *Deleted

## 2018-04-16 VITALS — BP 125/75 | HR 83 | Temp 97.5°F | Resp 18 | Ht 66.0 in | Wt 182.9 lb

## 2018-04-16 DIAGNOSIS — G620 Drug-induced polyneuropathy: Secondary | ICD-10-CM | POA: Diagnosis not present

## 2018-04-16 DIAGNOSIS — Z90722 Acquired absence of ovaries, bilateral: Secondary | ICD-10-CM | POA: Diagnosis not present

## 2018-04-16 DIAGNOSIS — R300 Dysuria: Secondary | ICD-10-CM

## 2018-04-16 DIAGNOSIS — C561 Malignant neoplasm of right ovary: Secondary | ICD-10-CM

## 2018-04-16 DIAGNOSIS — Z90710 Acquired absence of both cervix and uterus: Secondary | ICD-10-CM | POA: Diagnosis not present

## 2018-04-16 DIAGNOSIS — Z79899 Other long term (current) drug therapy: Secondary | ICD-10-CM | POA: Diagnosis not present

## 2018-04-16 DIAGNOSIS — M79604 Pain in right leg: Secondary | ICD-10-CM | POA: Diagnosis not present

## 2018-04-16 DIAGNOSIS — C569 Malignant neoplasm of unspecified ovary: Secondary | ICD-10-CM

## 2018-04-16 DIAGNOSIS — Z5111 Encounter for antineoplastic chemotherapy: Secondary | ICD-10-CM | POA: Diagnosis not present

## 2018-04-16 DIAGNOSIS — T451X5A Adverse effect of antineoplastic and immunosuppressive drugs, initial encounter: Secondary | ICD-10-CM | POA: Diagnosis not present

## 2018-04-16 LAB — COMPREHENSIVE METABOLIC PANEL
ALT: 15 U/L (ref 0–44)
AST: 17 U/L (ref 15–41)
Albumin: 3.9 g/dL (ref 3.5–5.0)
Alkaline Phosphatase: 65 U/L (ref 38–126)
Anion gap: 7 (ref 5–15)
BILIRUBIN TOTAL: 0.8 mg/dL (ref 0.3–1.2)
BUN: 25 mg/dL — ABNORMAL HIGH (ref 8–23)
CHLORIDE: 109 mmol/L (ref 98–111)
CO2: 25 mmol/L (ref 22–32)
Calcium: 8.9 mg/dL (ref 8.9–10.3)
Creatinine, Ser: 0.68 mg/dL (ref 0.44–1.00)
GFR calc non Af Amer: >60 mL/min (ref >60)
Glucose, Bld: 97 mg/dL (ref 70–99)
POTASSIUM: 4 mmol/L (ref 3.5–5.1)
Sodium: 141 mmol/L (ref 135–145)
TOTAL PROTEIN: 6.6 g/dL (ref 6.5–8.1)

## 2018-04-16 LAB — URINALYSIS, COMPLETE (UACMP) WITH MICROSCOPIC
BACTERIA UA: NONE SEEN
BILIRUBIN URINE: NEGATIVE
Glucose, UA: NEGATIVE mg/dL
KETONES UR: NEGATIVE mg/dL
NITRITE: NEGATIVE
Protein, ur: NEGATIVE mg/dL
SPECIFIC GRAVITY, URINE: 1.02 (ref 1.005–1.030)
pH: 5 (ref 5.0–8.0)

## 2018-04-16 LAB — CBC WITH DIFFERENTIAL/PLATELET
BASOS ABS: 0.1 10*3/uL (ref 0–0.1)
Basophils Relative: 1 %
EOS ABS: 0.4 10*3/uL (ref 0–0.7)
Eosinophils Relative: 4 %
HCT: 37.8 % (ref 35.0–47.0)
HEMOGLOBIN: 12.4 g/dL (ref 12.0–16.0)
LYMPHS ABS: 1.4 10*3/uL (ref 1.0–3.6)
LYMPHS PCT: 16 %
MCH: 30.8 pg (ref 26.0–34.0)
MCHC: 32.9 g/dL (ref 32.0–36.0)
MCV: 93.6 fL (ref 80.0–100.0)
Monocytes Absolute: 0.7 10*3/uL (ref 0.2–0.9)
Monocytes Relative: 8 %
NEUTROS PCT: 71 %
Neutro Abs: 6.3 10*3/uL (ref 1.4–6.5)
Platelets: 306 10*3/uL (ref 150–440)
RBC: 4.04 MIL/uL (ref 3.80–5.20)
RDW: 14.6 % — ABNORMAL HIGH (ref 11.5–14.5)
WBC: 8.8 10*3/uL (ref 3.6–11.0)

## 2018-04-16 MED ORDER — SODIUM CHLORIDE 0.9 % IV SOLN
175.0000 mg/m2 | Freq: Once | INTRAVENOUS | Status: AC
  Administered 2018-04-16: 342 mg via INTRAVENOUS
  Filled 2018-04-16: qty 57

## 2018-04-16 MED ORDER — SODIUM CHLORIDE 0.9% FLUSH
10.0000 mL | Freq: Once | INTRAVENOUS | Status: AC
  Administered 2018-04-16: 10 mL via INTRAVENOUS
  Filled 2018-04-16: qty 10

## 2018-04-16 MED ORDER — DIPHENHYDRAMINE HCL 50 MG/ML IJ SOLN
50.0000 mg | Freq: Once | INTRAMUSCULAR | Status: AC
  Administered 2018-04-16: 50 mg via INTRAVENOUS
  Filled 2018-04-16: qty 1

## 2018-04-16 MED ORDER — HEPARIN SOD (PORK) LOCK FLUSH 100 UNIT/ML IV SOLN
500.0000 [IU] | Freq: Once | INTRAVENOUS | Status: AC | PRN
  Administered 2018-04-16: 500 [IU]
  Filled 2018-04-16: qty 5

## 2018-04-16 MED ORDER — SODIUM CHLORIDE 0.9 % IV SOLN
Freq: Once | INTRAVENOUS | Status: AC
  Administered 2018-04-16: 11:00:00 via INTRAVENOUS
  Filled 2018-04-16: qty 1000

## 2018-04-16 MED ORDER — FAMOTIDINE IN NACL 20-0.9 MG/50ML-% IV SOLN
20.0000 mg | Freq: Once | INTRAVENOUS | Status: AC
  Administered 2018-04-16: 20 mg via INTRAVENOUS
  Filled 2018-04-16: qty 50

## 2018-04-16 MED ORDER — PALONOSETRON HCL INJECTION 0.25 MG/5ML
0.2500 mg | Freq: Once | INTRAVENOUS | Status: AC
  Administered 2018-04-16: 0.25 mg via INTRAVENOUS
  Filled 2018-04-16: qty 5

## 2018-04-16 MED ORDER — PEGFILGRASTIM 6 MG/0.6ML ~~LOC~~ PSKT
6.0000 mg | PREFILLED_SYRINGE | Freq: Once | SUBCUTANEOUS | Status: AC
  Administered 2018-04-16: 6 mg via SUBCUTANEOUS
  Filled 2018-04-16: qty 0.6

## 2018-04-16 MED ORDER — HEPARIN SOD (PORK) LOCK FLUSH 100 UNIT/ML IV SOLN: 500.0000 [IU] | Freq: Once | INTRAVENOUS | Status: DC

## 2018-04-16 MED ORDER — SYNTHROID 175 MCG PO TABS: 175.0000 ug | ORAL_TABLET | Freq: Every day | ORAL | 0 refills | Status: DC

## 2018-04-16 MED ORDER — SODIUM CHLORIDE 0.9 % IV SOLN
20.0000 mg | Freq: Once | INTRAVENOUS | Status: AC
  Administered 2018-04-16: 20 mg via INTRAVENOUS
  Filled 2018-04-16: qty 2

## 2018-04-16 MED ORDER — VITAMIN D (ERGOCALCIFEROL) 1.25 MG (50000 UNIT) PO CAPS: 50000.0000 [IU] | ORAL_CAPSULE | ORAL | 0 refills | Status: DC

## 2018-04-16 MED ORDER — SODIUM CHLORIDE 0.9 % IV SOLN
430.5000 mg | Freq: Once | INTRAVENOUS | Status: AC
  Administered 2018-04-16: 430 mg via INTRAVENOUS
  Filled 2018-04-16: qty 43

## 2018-04-16 NOTE — Progress Notes (Signed)
No new changes noted today 

## 2018-04-16 NOTE — Progress Notes (Signed)
Hematology/Oncology Consult note [facility]  Telephone:([phone] [phone]  Patient Care Team: System, Pcp Not In as PCP - General   Name of the patient: Melissa Anderson  [id number]  1940/11/13   Date of visit: 04/16/18  Diagnosis- Stage II ovarian cancer   Chief complaint/ Reason for visit-on treatment assessment prior to cycle #2 of carbotaxol adjuvant chemotherapy  Heme/Onc history: patient is a 77 year old female with a past medical history significant for hyperlipidemia and Graves' disease. She was recently complaining of back pain which led to an MRI of the lumbosacral spine. MRI in March 2019 which incidentally showed a multicystic 4 x 5.5 cm right adnexal mass with blood fluid levels. Prominent uterine endometrial stripe.  This was followed by a pelvic ultrasound in April 2019 which showed uterus measuring 9.7 x 4.1 x 5.9 cm. Multiloculated complex cystic structure in the right adnexa measuring 6.3 x 6.1 x 4.7 cm. It has solid-appearing components as well as anechoic portions separated by thick septations. No normal ovarian parenchyma is identified. No pelvic free fluid. Left ovary was not seen.  This was not further evaluated until patient's met with her gynecologist and was then referred to gynecology oncology Dr. Carlton Adam at [facility]. 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 Theora Gianotti on 02/12/2018.  She had a CT scan of her abdomen and pelvis on 02/09/2018 which showed multiseptated complex cystic lesion in the right pelvis. At adnexa immediately superior to the uterus measuring 6.9 x 5.3 cm. Abnormal thickening of the endometrial cavity measuring 1.3 cm. 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 Tumor is positive for PAX 8, WT 1 and p53. 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 3 lymph nodes negative for malignancy on the right and the left side. Omental biopsy was negative. Peritoneal biopsy was negative for malignancy. pT2a pN0  Baseline tumor markers including CA125 normal at 18  Cycle 1 of chemotherapy was given on 03/21/2018  Interval history-continues to have on and off pain in her right lower extremity and is yet to hear from neurology.  She reports some intermittent numbness in her soles.  She has a callus in her left little toe and would like to see podiatry soon. denies any fatigue, nausea vomiting  ECOG PS- 0 Pain scale- 0 Opioid associated constipation- no  Review of systems- Review of Systems  Constitutional: Negative for chills, fever, malaise/fatigue and weight loss.  HENT: Negative for congestion, ear discharge and nosebleeds.   Eyes: Negative for blurred vision.  Respiratory: Negative for cough, hemoptysis, sputum production, shortness of breath and wheezing.   Cardiovascular: Negative for chest pain, palpitations, orthopnea and claudication.  Gastrointestinal: Negative for abdominal pain, blood in stool, constipation, diarrhea, heartburn, melena, nausea and vomiting.  Genitourinary: Negative for dysuria, flank pain, frequency, hematuria and urgency.  Musculoskeletal: Negative for back pain, joint pain and myalgias.       Right lower extremity pain  Skin: Negative for rash.  Neurological: Positive for tingling. Negative for dizziness, focal weakness, seizures, weakness and headaches.  Endo/Heme/Allergies:  Does not bruise/bleed easily.  Psychiatric/Behavioral: Negative for depression  and suicidal ideas. The patient does not have insomnia.      Allergies  Allergen Reactions  . Epinephrine Other (See Comments)    Funny feeling, heart racing      Past Medical History:  Diagnosis Date  . Cancer [facility]) 2019   right sided ovarian cancer  . Hyperlipidemia   . Thyroid disease      Past Surgical History:  Procedure Laterality Date  . CHOLECYSTECTOMY  2008  . LAPAROSCOPIC VAGINAL HYSTERECTOMY WITH SALPINGO OOPHORECTOMY  02/15/2018  . PORTACATH PLACEMENT Left 02/25/2018   Procedure: INSERTION PORT-A-CATH;  Surgeon: Robert Bellow, MD;  Location: [facility] ORS;  Service: General;  Laterality: Left;  . TOTAL ABDOMINAL HYSTERECTOMY W/ BILATERAL SALPINGOOPHORECTOMY  02/15/2018   lymph nodes removed    Social History   Socioeconomic History  . Marital status: Widowed    Spouse name: Not on file  . Number of children: Not on file  . Years of education: Not on file  . Highest education level: Not on file  Occupational History  . Not on file  Social Needs  . Financial resource strain: Not on file  . Food insecurity:    Worry: Not on file    Inability: Not on file  . Transportation needs:    Medical: Not on file    Non-medical: Not on file  Tobacco Use  . Smoking status: Former Smoker    Packs/day: 1.00    Years: 25.00    Pack years: 25.00    Types: Cigarettes    Last attempt to quit: 02/26/1984    Years since quitting: 34.1  . Smokeless tobacco: Never Used  Substance and Sexual Activity  . Alcohol use: Never    Frequency: Never  . Drug use: Never  . Sexual activity: Not on file  Lifestyle  . Physical activity:    Days per week: Not on file    Minutes per session: Not on file  . Stress: Not on file  Relationships  . Social connections:    Talks on phone: Not on file    Gets together: Not on file    Attends religious service: Not on file    Active member of club or organization: Not on file    Attends meetings of clubs or organizations: Not on file      Relationship status: Not on file  . Intimate partner violence:    Fear of current or ex partner: Not on file    Emotionally abused: Not on file    Physically abused: Not on file    Forced sexual activity: Not on file  Other Topics Concern  . Not on file  Social History Narrative  . Not on file    Family History  Problem Relation Age of Onset  . Sudden death Mother 67       drowning in hurricane Katrina  . Pancreatic cancer Father        deceased early 79s; smoker  . Coronary artery disease Brother   . Melanoma Sister        deceased at 19  . Prostate cancer Maternal Uncle        deceased 62  . Breast cancer Paternal Aunt        unsure of this dx     Current Outpatient Medications:  .  HYDROcodone-acetaminophen (NORCO/VICODIN) 5-325 MG tablet, Take 1 tablet by mouth every 4 (four) hours as needed for moderate pain.,  Disp: 45 tablet, Rfl: 0 .  OVER THE COUNTER MEDICATION, Obagi facial cleansing otc system - use as directed, Disp: , Rfl:  .  rosuvastatin (CRESTOR) 10 MG tablet, TAKE 1 TABLET BY MOUTH EVERY DAY IN THE EVENING, Disp: 30 tablet, Rfl: 1 .  SYNTHROID 175 MCG tablet, Take 1 tablet (175 mcg total) by mouth daily., Disp: 30 tablet, Rfl: 0 .  VESICARE 5 MG tablet, Take 5 mg by mouth every evening. , Disp: , Rfl:  .  [START ON 04/17/2018] Vitamin D, Ergocalciferol, (DRISDOL) 50000 units CAPS capsule, Take 1 capsule (50,000 Units total) by mouth every Wednesday., Disp: 4 capsule, Rfl: 0 .  acetaminophen (TYLENOL) 500 MG tablet, Take 500 mg by mouth every 6 (six) hours as needed for moderate pain., Disp: , Rfl:  .  dexamethasone (DECADRON) 4 MG tablet, Take 2 tablets (8 mg total) by mouth daily. Start the day after chemotherapy for 2 days. (Patient not taking: Reported on 04/16/2018), Disp: 30 tablet, Rfl: 1 .  lidocaine-prilocaine (EMLA) cream, Apply to affected area once (Patient not taking: Reported on 04/16/2018), Disp: 30 g, Rfl: 3 .  LORazepam (ATIVAN) 0.5 MG tablet,  Take 1 tablet (0.5 mg total) by mouth every 6 (six) hours as needed (Nausea or vomiting). (Patient not taking: Reported on 04/16/2018), Disp: 30 tablet, Rfl: 0 .  ondansetron (ZOFRAN) 8 MG tablet, Take 1 tablet (8 mg total) by mouth 2 (two) times daily as needed for refractory nausea / vomiting. Start on day 3 after chemo. (Patient not taking: Reported on 04/16/2018), Disp: 30 tablet, Rfl: 1 .  prochlorperazine (COMPAZINE) 10 MG tablet, Take 1 tablet (10 mg total) by mouth every 6 (six) hours as needed (Nausea or vomiting). (Patient not taking: Reported on 04/16/2018), Disp: 30 tablet, Rfl: 1 No current facility-administered medications for this visit.   Facility-Administered Medications Ordered in Other Visits:  .  CARBOplatin (PARAPLATIN) 430 mg in sodium chloride 0.9 % 250 mL chemo infusion, 430 mg, Intravenous, Once, Sindy Guadeloupe, MD .  heparin lock flush 100 unit/mL, 500 Units, Intravenous, Once, Sindy Guadeloupe, MD .  heparin lock flush 100 unit/mL, 500 Units, Intracatheter, Once PRN, Sindy Guadeloupe, MD .  PACLitaxel (TAXOL) 342 mg in sodium chloride 0.9 % 500 mL chemo infusion (> 79m/m2), 175 mg/m2 (Treatment Plan Recorded), Intravenous, Once, RSindy Guadeloupe MD, Last Rate: 186 mL/hr at 04/16/18 1156, 342 mg at 04/16/18 1156 .  pegfilgrastim (NEULASTA ONPRO KIT) injection 6 mg, 6 mg, Subcutaneous, Once, RSindy Guadeloupe MD  Physical exam:  Vitals:   04/16/18 0931  BP: 125/75  Pulse: 83  Resp: 18  Temp: (!) 97.5 F (36.4 C)  TempSrc: Tympanic  SpO2: 97%  Weight: 182 lb 14.4 oz (83 kg)  Height: '5\' 6"'  (1.676 m)   Physical Exam  Constitutional: She is oriented to person, place, and time. She appears well-developed and well-nourished.  HENT:  Head: Normocephalic and atraumatic.  Eyes: Pupils are equal, round, and reactive to light. EOM are normal.  Neck: Normal range of motion.  Cardiovascular: Normal rate, regular rhythm and normal heart sounds.  Pulmonary/Chest: Effort normal and  breath sounds normal.  Abdominal: Soft. Bowel sounds are normal.  Musculoskeletal: She exhibits edema (trace).  Neurological: She is alert and oriented to person, place, and time.  Skin: Skin is warm and dry.     CMP Latest Ref Rng & Units 04/16/2018  Glucose 70 - 99 mg/dL 97  BUN 8 - 23 mg/dL 25(H)  Creatinine 0.44 - 1.00 mg/dL 0.68  Sodium 135 - 145 mmol/L 141  Potassium 3.5 - 5.1 mmol/L 4.0  Chloride 98 - 111 mmol/L 109  CO2 22 - 32 mmol/L 25  Calcium 8.9 - 10.3 mg/dL 8.9  Total Protein 6.5 - 8.1 g/dL 6.6  Total Bilirubin 0.3 - 1.2 mg/dL 0.8  Alkaline Phos 38 - 126 U/L 65  AST 15 - 41 U/L 17  ALT 0 - 44 U/L 15   CBC Latest Ref Rng & Units 04/16/2018  WBC 3.6 - 11.0 K/uL 8.8  Hemoglobin 12.0 - 16.0 g/dL 12.4  Hematocrit 35.0 - 47.0 % 37.8  Platelets 150 - 440 K/uL 306    No images are attached to the encounter.  US Renal  Result Date: 03/25/2018 CLINICAL DATA:  Abnormal CT scan. EXAM: RENAL / URINARY TRACT ULTRASOUND COMPLETE COMPARISON:  CT scan of February 27, 2018. FINDINGS: Right Kidney: Length: 11.3 cm. Multiple parapelvic cysts are noted with the largest measuring 2 cm. Echogenicity within normal limits. No mass or hydronephrosis visualized. Left Kidney: Length: 12 cm. Multiple parapelvic cysts are noted with the largest measuring 2.5 cm. Echogenicity within normal limits. No hydronephrosis visualized. 6 mm hyperechoic focus is noted in midpole cortex most consistent with renal angiomyolipoma. Bladder: Appears normal for degree of bladder distention. IMPRESSION: Multiple parapelvic simple cysts are noted bilaterally. 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 No hydronephrosis or renal obstruction is noted. Electronically Signed   By: Marijo Conception, M.D.   On: 03/25/2018 08:53     Assessment and plan- Patient is a 77 y.o. female with stage IIA ovarian cancer pT2a pN0 cM0 status post surgery.   She is here for on treatment assessment prior to cycle 2 of adjuvant  carbotaxol chemotherapy  Counts okay to proceed with cycle 2 of carbotaxol chemotherapy today with on pro-Neulasta support.  Overall she has tolerated her chemotherapy well.  She does report mild tingling in her right sole.  Continue to monitor.  She will be getting Taxol at 175 mg/m.  If the symptoms get worse I will consider dose reducing Taxol further.  I will see her back in 3 weeks time with CBC and CMP prior to cycle #3 of chemotherapy.  I will make a referral for her to get a new primary care provider as well as a podiatry referral at this time   Visit Diagnosis 1. 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 Encounter for antineoplastic chemotherapy      Dr. Randa Evens, MD, MPH [facility] at [facility] [phone] 04/16/2018 12:45 PM

## 2018-04-17 ENCOUNTER — Other Ambulatory Visit: Payer: Self-pay | Admitting: *Deleted

## 2018-04-17 ENCOUNTER — Telehealth: Payer: Self-pay

## 2018-04-17 ENCOUNTER — Telehealth: Payer: Self-pay | Admitting: *Deleted

## 2018-04-17 DIAGNOSIS — L609 Nail disorder, unspecified: Secondary | ICD-10-CM

## 2018-04-17 MED ORDER — CIPROFLOXACIN HCL 500 MG PO TABS: 500.0000 mg | ORAL_TABLET | Freq: Two times a day (BID) | ORAL | 0 refills | Status: DC

## 2018-04-17 NOTE — Telephone Encounter (Signed)
Called and spoke to pt earlier about UTI sx and atb and she asked about podiatry MD. Made referral and called [facility] in [place] but it was a call center and spoke to Levada Dy that states they get the internal referral and she will contact [place] office and the office will call pt . Directly for appt because they have all info in chart. I have also sent an inbasket to get pt appt with  in [place] for dr tracy Mclean-Scocuzza.

## 2018-04-17 NOTE — Telephone Encounter (Signed)
Called pt and let her know that the UA showed some leukocytes and some bact. So she probably has UTI  And we have send [facility] c[facility] We will also keep looking for culture results and call if we need to change her atb. She is agreeable to plan

## 2018-04-17 NOTE — Telephone Encounter (Signed)
Spoke with the patient to inform her that the referral to the Neurology has been faxed on 04/05/18 and i spoke with Dr Melrose Nakayama office on 04/16/18 and they have confirm they did receive the referral and will get working on it immediately. At this time the patient has not received a call from Dr Melrose Nakayama office to get her schedule. The patient was understanding to the information.

## 2018-04-18 ENCOUNTER — Telehealth: Payer: Self-pay | Admitting: *Deleted

## 2018-04-18 LAB — URINE CULTURE

## 2018-04-18 NOTE — Telephone Encounter (Signed)
Called pt and let her know that her culture did come back with a specific bacteria in it but the cipro she is on covers that bacteria. I also told he rthat I got a PCP aptpt 9/11 and she does not know if she will be in [place] that date. I told her she has an appt for 9/10 for chemo so I assume she will still be here the next day for the PCP appt. She said that made sense. I told her that I sent referral to [facility] and they are to call her directly and that if she wants I can send a sticky with number on it for [facility]. I will also put the date and time for PCP appt. I will call again about neurology appt. She is agreeable to this

## 2018-04-22 ENCOUNTER — Telehealth: Payer: Self-pay | Admitting: *Deleted

## 2018-04-22 ENCOUNTER — Telehealth: Payer: Self-pay

## 2018-04-22 DIAGNOSIS — G609 Hereditary and idiopathic neuropathy, unspecified: Secondary | ICD-10-CM

## 2018-04-22 DIAGNOSIS — C561 Malignant neoplasm of right ovary: Secondary | ICD-10-CM

## 2018-04-22 NOTE — Telephone Encounter (Signed)
//  Spoke with front desk clerk at Dr Perrin Smack office to see if Melissa Anderson has appointment schedule. The referral has been done as of 04/05/18. The staff report the have received the referral. I spoke with office staff on 04/17/18 and agreed to get working on it right away. With contacting the office today they don't know why the patient has not gotten an appointment at this time and agreed again today to began working on it and agreed to give the patient a call as soon as possible.

## 2018-04-22 NOTE — Telephone Encounter (Signed)
Called pt and left message on voicemail.  Told pt we know she is in a look good, fell good class today and if she wants to see someone there we can make an appt for symptom management staff, she can wait to see [place] tom. And I can make an appt for her, I can also order cymbalta 30 mg daily for 1 week and then 2 pills daily for the rest of the month.  I can send med today and make appt for either choice of hers or wait to give med tom.  See if she has tried nausea med. I have asked her to call up to [facility] and said on voicemail at lunch from 12:30 to 1:30

## 2018-04-22 NOTE — Telephone Encounter (Signed)
-----   Message from Secundino Ginger sent at 04/22/2018  9:33 AM EDT ----- Regarding: pt not feeling well Contact: [phone] Very bad day yesterday/ stayed in bed all day/ nausea last night. nueropothy is climbing to her knees. Pt and Dr Mike Gip felt you needed to know in case you wanted to do something. She is going to [employer] for a Beauty and makeup class today.

## 2018-04-22 NOTE — Telephone Encounter (Signed)
Called the patient and she wanted to know if she had a vitamin def.  Like vitamin B that could cause those sx. I checked with Dr. Janese Banks has in the studies there is no proof that vitamin b def. Causes neuropathy or helps neuropathy.  Dr. Janese Banks suggested the cymbalta and she is agreeable to that. She would like to see Dr. Janese Banks and gave her appt for tom 9:15.  Dr. Janese Banks said that if pt wants we can check vit b 12 level tom.. But we can do it after she sees the patient.

## 2018-04-22 NOTE — Addendum Note (Signed)
Addended by: Luella Cook on: 04/22/2018 02:48 PM   Modules accepted: Orders

## 2018-04-23 ENCOUNTER — Encounter: Payer: Self-pay | Admitting: Oncology

## 2018-04-23 ENCOUNTER — Other Ambulatory Visit: Payer: Self-pay

## 2018-04-23 ENCOUNTER — Inpatient Hospital Stay (HOSPITAL_BASED_OUTPATIENT_CLINIC_OR_DEPARTMENT_OTHER): Payer: Medicare Other | Admitting: Oncology

## 2018-04-23 ENCOUNTER — Encounter: Payer: Self-pay | Admitting: Podiatry

## 2018-04-23 ENCOUNTER — Inpatient Hospital Stay: Payer: Medicare Other

## 2018-04-23 ENCOUNTER — Ambulatory Visit (INDEPENDENT_AMBULATORY_CARE_PROVIDER_SITE_OTHER): Payer: Medicare Other | Admitting: Podiatry

## 2018-04-23 VITALS — BP 109/76 | HR 109 | Resp 18 | Ht 66.0 in | Wt 179.3 lb

## 2018-04-23 VITALS — BP 106/71 | HR 89

## 2018-04-23 DIAGNOSIS — G609 Hereditary and idiopathic neuropathy, unspecified: Secondary | ICD-10-CM

## 2018-04-23 DIAGNOSIS — Z5111 Encounter for antineoplastic chemotherapy: Secondary | ICD-10-CM | POA: Diagnosis not present

## 2018-04-23 DIAGNOSIS — L84 Corns and callosities: Secondary | ICD-10-CM

## 2018-04-23 DIAGNOSIS — G620 Drug-induced polyneuropathy: Secondary | ICD-10-CM

## 2018-04-23 DIAGNOSIS — C561 Malignant neoplasm of right ovary: Secondary | ICD-10-CM

## 2018-04-23 DIAGNOSIS — M2042 Other hammer toe(s) (acquired), left foot: Secondary | ICD-10-CM | POA: Diagnosis not present

## 2018-04-23 DIAGNOSIS — Z90722 Acquired absence of ovaries, bilateral: Secondary | ICD-10-CM | POA: Diagnosis not present

## 2018-04-23 DIAGNOSIS — Z90710 Acquired absence of both cervix and uterus: Secondary | ICD-10-CM | POA: Diagnosis not present

## 2018-04-23 DIAGNOSIS — Z79899 Other long term (current) drug therapy: Secondary | ICD-10-CM

## 2018-04-23 DIAGNOSIS — T451X5A Adverse effect of antineoplastic and immunosuppressive drugs, initial encounter: Secondary | ICD-10-CM

## 2018-04-23 DIAGNOSIS — M79604 Pain in right leg: Secondary | ICD-10-CM | POA: Diagnosis not present

## 2018-04-23 DIAGNOSIS — C569 Malignant neoplasm of unspecified ovary: Secondary | ICD-10-CM

## 2018-04-23 LAB — VITAMIN B12: Vitamin B-12: 3222 pg/mL — ABNORMAL HIGH (ref 180–914)

## 2018-04-23 MED ORDER — DULOXETINE HCL 30 MG PO CPEP: 30.0000 mg | ORAL_CAPSULE | Freq: Every day | ORAL | 3 refills | Status: DC

## 2018-04-23 NOTE — Progress Notes (Signed)
Hematology/Oncology Consult note [facility]  Telephone:([phone] [phone]  Patient Care Team: System, Pcp Not In as PCP - General   Name of the patient: Melissa Anderson  [id number]  02-28-1941   Date of visit: 04/23/18  Diagnosis- Stage II ovarian cancer  Chief complaint/ Reason for visit-sick visit for symptoms of peripheral neuropathy  Heme/Onc history: patient is a 77 year old female with a past medical history significant for hyperlipidemia and Graves' disease. She was recently complaining of back pain which led to an MRI of the lumbosacral spine. MRI in March 2019 which incidentally showed a multicystic 4 x 5.5 cm right adnexal mass with blood fluid levels. Prominent uterine endometrial stripe.  This was followed by a pelvic ultrasound in April 2019 which showed uterus measuring 9.7 x 4.1 x 5.9 cm. Multiloculated complex cystic structure in the right adnexa measuring 6.3 x 6.1 x 4.7 cm. It has solid-appearing components as well as anechoic portions separated by thick septations. No normal ovarian parenchyma is identified. No pelvic free fluid. Left ovary was not seen.  This was not further evaluated until patient's met with her gynecologist and was then referred to gynecology oncology Dr. Carlton Adam at [facility]. 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 Theora Gianotti on 02/12/2018.  She had a CT scan of her abdomen and pelvis on 02/09/2018 which showed multiseptated complex cystic lesion in the right pelvis. At adnexa immediately superior to the uterus measuring 6.9 x 5.3 cm. Abnormal thickening of the endometrial cavity measuring 1.3 cm. 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 Tumor is positive for PAX 8, WT 1 and p53. 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 3 lymph nodes negative for malignancy on the right and the left side. Omental biopsy was negative. Peritoneal biopsy was negative for malignancy. pT2a pN0  Baseline tumor markers including CA125 normal at 18  Cycle 1 of chemotherapy was given on 03/21/2018  Interval history-patient reports symptoms of tingling numbness which started after the first cycle of Taxol it was mainly in her shoulders and was intermittent.  After the second cycle of chemo she noticed that her tingling and numbness has now progressed to words her bilateral legs going up to her knees.  Some days it is better and goes down again.  There are occasions when she has to constantly rub her bilateral legs to make the sensation go away.  At times she feels that her feet are very cold.  ECOG PS- 1 Pain scale- 0 Opioid associated constipation- no  Review of systems- Review of Systems  Constitutional: Negative for chills, fever, malaise/fatigue and weight loss.  HENT: Negative for congestion, ear discharge and nosebleeds.   Eyes: Negative for blurred vision.  Respiratory: Negative for cough, hemoptysis, sputum production, shortness of breath and wheezing.   Cardiovascular: Negative for chest pain, palpitations, orthopnea and claudication.  Gastrointestinal: Negative for abdominal pain, blood in stool, constipation, diarrhea, heartburn, melena, nausea and vomiting.  Genitourinary: Negative for dysuria, flank pain, frequency, hematuria and urgency.  Musculoskeletal: Negative for back pain,  joint pain and myalgias.  Skin: Negative for rash.  Neurological: Positive for tingling. Negative for  dizziness, focal weakness, seizures, weakness and headaches.  Endo/Heme/Allergies: Does not bruise/bleed easily.  Psychiatric/Behavioral: Negative for depression and suicidal ideas. The patient does not have insomnia.      Allergies  Allergen Reactions  . Epinephrine Other (See Comments)    Funny feeling, heart racing      Past Medical History:  Diagnosis Date  . Cancer [facility]) 2019   right sided ovarian cancer  . Hyperlipidemia   . Thyroid disease      Past Surgical History:  Procedure Laterality Date  . CHOLECYSTECTOMY  2008  . LAPAROSCOPIC VAGINAL HYSTERECTOMY WITH SALPINGO OOPHORECTOMY  02/15/2018  . PORTACATH PLACEMENT Left 02/25/2018   Procedure: INSERTION PORT-A-CATH;  Surgeon: Robert Bellow, MD;  Location: [facility] ORS;  Service: General;  Laterality: Left;  . TOTAL ABDOMINAL HYSTERECTOMY W/ BILATERAL SALPINGOOPHORECTOMY  02/15/2018   lymph nodes removed    Social History   Socioeconomic History  . Marital status: Widowed    Spouse name: Not on file  . Number of children: Not on file  . Years of education: Not on file  . Highest education level: Not on file  Occupational History  . Not on file  Social Needs  . Financial resource strain: Not on file  . Food insecurity:    Worry: Not on file    Inability: Not on file  . Transportation needs:    Medical: Not on file    Non-medical: Not on file  Tobacco Use  . Smoking status: Former Smoker    Packs/day: 1.00    Years: 25.00    Pack years: 25.00    Types: Cigarettes    Last attempt to quit: 02/26/1984    Years since quitting: 34.1  . Smokeless tobacco: Never Used  Substance and Sexual Activity  . Alcohol use: Never    Frequency: Never  . Drug use: Never  . Sexual activity: Not on file  Lifestyle  . Physical activity:    Days per week: Not on file    Minutes per session: Not on file  . Stress: Not on file  Relationships  . Social connections:    Talks on phone: Not on file    Gets together: Not on  file    Attends religious service: Not on file    Active member of club or organization: Not on file    Attends meetings of clubs or organizations: Not on file    Relationship status: Not on file  . Intimate partner violence:    Fear of current or ex partner: Not on file    Emotionally abused: Not on file    Physically abused: Not on file    Forced sexual activity: Not on file  Other Topics Concern  . Not on file  Social History Narrative  . Not on file    Family History  Problem Relation Age of Onset  . Sudden death Mother 32       drowning in hurricane Katrina  . Pancreatic cancer Father        deceased early 44s; smoker  . Coronary artery disease Brother   . Melanoma Sister        deceased at 54  . Prostate cancer Maternal Uncle        deceased 44  . Breast cancer Paternal Aunt        unsure of this dx     Current  Outpatient Medications:  .  ciprofloxacin (CIPRO) 500 MG tablet, Take 1 tablet (500 mg total) by mouth 2 (two) times daily., Disp: 14 tablet, Rfl: 0 .  HYDROcodone-acetaminophen (NORCO/VICODIN) 5-325 MG tablet, Take 1 tablet by mouth every 4 (four) hours as needed for moderate pain., Disp: 45 tablet, Rfl: 0 .  OVER THE COUNTER MEDICATION, Obagi facial cleansing otc system - use as directed, Disp: , Rfl:  .  rosuvastatin (CRESTOR) 10 MG tablet, TAKE 1 TABLET BY MOUTH EVERY DAY IN THE EVENING, Disp: 30 tablet, Rfl: 1 .  SYNTHROID 175 MCG tablet, Take 1 tablet (175 mcg total) by mouth daily., Disp: 30 tablet, Rfl: 0 .  VESICARE 5 MG tablet, Take 5 mg by mouth every evening. , Disp: , Rfl:  .  Vitamin D, Ergocalciferol, (DRISDOL) 50000 units CAPS capsule, Take 1 capsule (50,000 Units total) by mouth every Wednesday., Disp: 4 capsule, Rfl: 0 .  acetaminophen (TYLENOL) 500 MG tablet, Take 500 mg by mouth every 6 (six) hours as needed for moderate pain., Disp: , Rfl:  .  dexamethasone (DECADRON) 4 MG tablet, Take 2 tablets (8 mg total) by mouth daily. Start the day  after chemotherapy for 2 days. (Patient not taking: Reported on 04/16/2018), Disp: 30 tablet, Rfl: 1 .  DULoxetine (CYMBALTA) 30 MG capsule, Take 1 capsule (30 mg total) by mouth daily. For 7 days and then 2 capsule daily for rest of month, Disp: 53 capsule, Rfl: 3 .  lidocaine-prilocaine (EMLA) cream, Apply to affected area once (Patient not taking: Reported on 04/16/2018), Disp: 30 g, Rfl: 3 .  LORazepam (ATIVAN) 0.5 MG tablet, Take 1 tablet (0.5 mg total) by mouth every 6 (six) hours as needed (Nausea or vomiting). (Patient not taking: Reported on 04/16/2018), Disp: 30 tablet, Rfl: 0 .  ondansetron (ZOFRAN) 8 MG tablet, Take 1 tablet (8 mg total) by mouth 2 (two) times daily as needed for refractory nausea / vomiting. Start on day 3 after chemo. (Patient not taking: Reported on 04/16/2018), Disp: 30 tablet, Rfl: 1 .  prochlorperazine (COMPAZINE) 10 MG tablet, Take 1 tablet (10 mg total) by mouth every 6 (six) hours as needed (Nausea or vomiting). (Patient not taking: Reported on 04/16/2018), Disp: 30 tablet, Rfl: 1  Physical exam:  Vitals:   04/23/18 0918  BP: 109/76  Pulse: (!) 109  Resp: 18  TempSrc: Tympanic  SpO2: 96%  Weight: 179 lb 4.8 oz (81.3 kg)  Height: 5' 6" (1.676 m)   Physical Exam  Constitutional: She is oriented to person, place, and time. She appears well-developed and well-nourished.  HENT:  Head: Normocephalic and atraumatic.  Eyes: Pupils are equal, round, and reactive to light. EOM are normal.  Neck: Normal range of motion.  Cardiovascular: Normal rate, regular rhythm and normal heart sounds.  Pulmonary/Chest: Effort normal and breath sounds normal.  Abdominal: Soft. Bowel sounds are normal.  Neurological: She is alert and oriented to person, place, and time.  Sensory exam mild limitation to touch perception over the left sole of the foot.  Position sense is intact  Skin: Skin is warm and dry.     CMP Latest Ref Rng & Units 04/16/2018  Glucose 70 - 99 mg/dL 97  BUN  8 - 23 mg/dL 25(H)  Creatinine 0.44 - 1.00 mg/dL 0.68  Sodium 135 - 145 mmol/L 141  Potassium 3.5 - 5.1 mmol/L 4.0  Chloride 98 - 111 mmol/L 109  CO2 22 - 32 mmol/L 25  Calcium 8.9 - 10.3 mg/dL  8.9  Total Protein 6.5 - 8.1 g/dL 6.6  Total Bilirubin 0.3 - 1.2 mg/dL 0.8  Alkaline Phos 38 - 126 U/L 65  AST 15 - 41 U/L 17  ALT 0 - 44 U/L 15   CBC Latest Ref Rng & Units 04/16/2018  WBC 3.6 - 11.0 K/uL 8.8  Hemoglobin 12.0 - 16.0 g/dL 12.4  Hematocrit 35.0 - 47.0 % 37.8  Platelets 150 - 440 K/uL 306    No images are attached to the encounter.  US Renal  Result Date: 03/25/2018 CLINICAL DATA:  Abnormal CT scan. EXAM: RENAL / URINARY TRACT ULTRASOUND COMPLETE COMPARISON:  CT scan of February 27, 2018. FINDINGS: Right Kidney: Length: 11.3 cm. Multiple parapelvic cysts are noted with the largest measuring 2 cm. Echogenicity within normal limits. No mass or hydronephrosis visualized. Left Kidney: Length: 12 cm. Multiple parapelvic cysts are noted with the largest measuring 2.5 cm. Echogenicity within normal limits. No hydronephrosis visualized. 6 mm hyperechoic focus is noted in midpole cortex most consistent with renal angiomyolipoma. Bladder: Appears normal for degree of bladder distention. IMPRESSION: Multiple parapelvic simple cysts are noted bilaterally. 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 No hydronephrosis or renal obstruction is noted. Electronically Signed   By: Marijo Conception, M.D.   On: 03/25/2018 08:53     Assessment and plan- Patient is a 77 y.o. female with stage II ovarian cancer status post 2 cycles of carbotaxol now presenting with chemo-induced peripheral neuropathy  Discussed natural history of Taxol-induced peripheral neuropathy.  That we do not have good treatments available for this and this could potentially get worse with subsequent treatments and may be irreversible with after stopping treatment.  Based on present signs and symptoms of neuropathy seems to be mild  grade 1.  I have prescribed Cymbalta 30 mg once a day which can be increased to 60 mg once a day after 1 week for treatment of this condition but again the evidence is modest at best.  She can always have the prescription handy and decide to take it after the next cycle of chemotherapy if her neuropathy is worse.  Acupuncture is also shown to have some benefit in the setting and we could always refer her to that in the future should her neuropathy get worse.  There are some studies looking at women who took Taxol for breast cancer who went for cooling gloves and socks during treatment and that showed lower incidence of neuropathy in these women.  I have suggested that she could look into the buying these for her subsequent treatments to see if her neuropathy symptoms can be minimized with subsequent exposure to Taxol.  I will check her B12 levels today but if her B12 levels are normal there would be no role for empiric B12 supplementation or multivitamins that has shown to help with chemo-induced peripheral neuropathy.  Exercise has also shown some benefit in neuropathy and I have encouraged her to try to be as active as possible  Depending on how her symptoms progress I will decide if I can continue with the same dose of Taxol or I need to dose reduce Taxol with subsequent treatments.   Visit Diagnosis 1. Chemotherapy-induced peripheral neuropathy ([place])      Dr. Randa Evens, MD, MPH [facility] [phone] 04/23/2018 11:18 AM

## 2018-04-23 NOTE — Progress Notes (Addendum)
This patient presents to the office with chief complaint of a painful corn on the fifth toe left foot.  She says this corn develops every few months and she was seen by her podiatrist at home in [place].  She says she  is being treated for her cancer in [place].  She says that her podiatrist in [place] had discussed  surgical correction of her  fifth toe left foot.  She says that he would straighten out her toe and the corn would resolve. She states she has developed neuropathy due to her chemo.  She presents the office today for treatment of her painful fifth toe left foot.  General Appearance  Alert, conversant and in no acute stress.  Vascular  Dorsalis pedis and posterior tibial  pulses are palpable  bilaterally.  Capillary return is within normal limits  bilaterally. Temperature is within normal limits  bilaterally.  Neurologic  Senn-Weinstein monofilament wire test diminished  bilaterally. Muscle power within normal limits bilaterally.  Nails Normal nails with no evidence of bacterial or fungal infection.  Orthopedic  No limitations of motion  feet .  No crepitus or effusions noted.  No bony pathology .  Adducto varus fifth digit left foot.  Skin  normotropic skin with no porokeratosis noted bilaterally.  No signs of infections or ulcers noted.  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  IE  Debride corn.   Discussed this condition with this patient.  Told her that she could have her surgery performed here at this office in [place].  RTC prn.   Gardiner Barefoot DPM

## 2018-04-23 NOTE — Progress Notes (Signed)
Increase Neuropathy to bilateral lower legs x 2 days and also constipation / little brick.

## 2018-04-25 ENCOUNTER — Telehealth: Payer: Self-pay | Admitting: *Deleted

## 2018-04-25 ENCOUNTER — Ambulatory Visit: Payer: Medicare Other | Admitting: Podiatry

## 2018-04-25 NOTE — Telephone Encounter (Signed)
Friend called with pt in dentist office and said that pt needs root canal. I asked if it was now and she said no. She will get sent to surgeon for it. I explained that it needs to be done 3 days before her treatment. That is when labs are the best before treatment and therefore she could have surgery and have 2 days before she has chemo to feel better from surgery.  Her next appt 9/10 for chemo so if it does not work out for 3 days before this date then maybe work it out for the treatment after this. They will let us know when she is getting the surgery date to figure out what is best.

## 2018-04-26 ENCOUNTER — Telehealth: Payer: Self-pay | Admitting: *Deleted

## 2018-04-26 NOTE — Telephone Encounter (Signed)
Dr. Janese Banks wrote a letter and Brand Males a copy and put in record and gave the original one to Dr. Mike Gip. She will take it home and give to her mom. I will call [place] office next week the office was closed when I called today. Only open til 12 noon

## 2018-04-26 NOTE — Telephone Encounter (Signed)
Patient called reporting that We need to contact a Dr Melissa Anderson Dentist and speak with Melissa Anderson regarding procedure he wants to do the Friday 05/24/18 before her chemo on Tuesday.  [name], DDS PA  Dentist in [place], [place]  Address: [address] [place], [place], [place] [address]  Hours: Closes soon: 12PM ? [name]  Phone: [phone]  She is also requesting a letter for the [employer] to have her dog travel with her for her anxiety

## 2018-05-01 ENCOUNTER — Telehealth: Payer: Self-pay

## 2018-05-01 ENCOUNTER — Other Ambulatory Visit: Payer: Self-pay

## 2018-05-01 DIAGNOSIS — C561 Malignant neoplasm of right ovary: Secondary | ICD-10-CM

## 2018-05-01 NOTE — Telephone Encounter (Signed)
Spoke with Melissa Anderson to inform her that per Dr Janese Banks the patient will need to come in for labs only on 05/23/18 before her dental surgery. The patient was agreeable and understanding to come for labs at 3:15 for labs only.

## 2018-05-07 ENCOUNTER — Other Ambulatory Visit: Payer: Self-pay

## 2018-05-07 ENCOUNTER — Inpatient Hospital Stay (HOSPITAL_BASED_OUTPATIENT_CLINIC_OR_DEPARTMENT_OTHER): Payer: Medicare Other | Admitting: Oncology

## 2018-05-07 ENCOUNTER — Inpatient Hospital Stay: Payer: Medicare Other

## 2018-05-07 ENCOUNTER — Inpatient Hospital Stay: Payer: Medicare Other | Attending: Oncology

## 2018-05-07 ENCOUNTER — Encounter: Payer: Self-pay | Admitting: Oncology

## 2018-05-07 ENCOUNTER — Other Ambulatory Visit: Payer: Self-pay | Admitting: Oncology

## 2018-05-07 VITALS — BP 127/83 | HR 75 | Temp 98.3°F | Wt 185.2 lb

## 2018-05-07 DIAGNOSIS — C561 Malignant neoplasm of right ovary: Secondary | ICD-10-CM

## 2018-05-07 DIAGNOSIS — Z87891 Personal history of nicotine dependence: Secondary | ICD-10-CM | POA: Insufficient documentation

## 2018-05-07 DIAGNOSIS — C5702 Malignant neoplasm of left fallopian tube: Secondary | ICD-10-CM | POA: Insufficient documentation

## 2018-05-07 DIAGNOSIS — R32 Unspecified urinary incontinence: Secondary | ICD-10-CM

## 2018-05-07 DIAGNOSIS — T451X5S Adverse effect of antineoplastic and immunosuppressive drugs, sequela: Secondary | ICD-10-CM

## 2018-05-07 DIAGNOSIS — C7961 Secondary malignant neoplasm of right ovary: Secondary | ICD-10-CM | POA: Diagnosis not present

## 2018-05-07 DIAGNOSIS — Z5111 Encounter for antineoplastic chemotherapy: Secondary | ICD-10-CM | POA: Insufficient documentation

## 2018-05-07 DIAGNOSIS — Z90722 Acquired absence of ovaries, bilateral: Secondary | ICD-10-CM | POA: Insufficient documentation

## 2018-05-07 DIAGNOSIS — C7962 Secondary malignant neoplasm of left ovary: Secondary | ICD-10-CM | POA: Diagnosis not present

## 2018-05-07 DIAGNOSIS — G620 Drug-induced polyneuropathy: Secondary | ICD-10-CM | POA: Diagnosis not present

## 2018-05-07 DIAGNOSIS — Z79899 Other long term (current) drug therapy: Secondary | ICD-10-CM

## 2018-05-07 DIAGNOSIS — Z90710 Acquired absence of both cervix and uterus: Secondary | ICD-10-CM | POA: Diagnosis not present

## 2018-05-07 DIAGNOSIS — Z7689 Persons encountering health services in other specified circumstances: Secondary | ICD-10-CM | POA: Diagnosis not present

## 2018-05-07 LAB — COMPREHENSIVE METABOLIC PANEL
ALT: 14 U/L (ref 0–44)
ANION GAP: 6 (ref 5–15)
AST: 18 U/L (ref 15–41)
Albumin: 3.9 g/dL (ref 3.5–5.0)
Alkaline Phosphatase: 63 U/L (ref 38–126)
BILIRUBIN TOTAL: 0.8 mg/dL (ref 0.3–1.2)
BUN: 19 mg/dL (ref 8–23)
CHLORIDE: 110 mmol/L (ref 98–111)
CO2: 25 mmol/L (ref 22–32)
Calcium: 9.1 mg/dL (ref 8.9–10.3)
Creatinine, Ser: 0.58 mg/dL (ref 0.44–1.00)
GLUCOSE: 80 mg/dL (ref 70–99)
POTASSIUM: 4.1 mmol/L (ref 3.5–5.1)
Sodium: 141 mmol/L (ref 135–145)
TOTAL PROTEIN: 6.3 g/dL — AB (ref 6.5–8.1)

## 2018-05-07 LAB — CBC WITH DIFFERENTIAL/PLATELET
BASOS ABS: 0 10*3/uL (ref 0–0.1)
Basophils Relative: 0 %
EOS PCT: 2 %
Eosinophils Absolute: 0.1 10*3/uL (ref 0–0.7)
HEMATOCRIT: 35.9 % (ref 35.0–47.0)
Hemoglobin: 11.9 g/dL — ABNORMAL LOW (ref 12.0–16.0)
LYMPHS ABS: 1.3 10*3/uL (ref 1.0–3.6)
LYMPHS PCT: 20 %
MCH: 30.9 pg (ref 26.0–34.0)
MCHC: 33.2 g/dL (ref 32.0–36.0)
MCV: 93.2 fL (ref 80.0–100.0)
Monocytes Absolute: 0.6 10*3/uL (ref 0.2–0.9)
Monocytes Relative: 9 %
NEUTROS ABS: 4.7 10*3/uL (ref 1.4–6.5)
Neutrophils Relative %: 69 %
PLATELETS: 269 10*3/uL (ref 150–440)
RBC: 3.85 MIL/uL (ref 3.80–5.20)
RDW: 16.2 % — ABNORMAL HIGH (ref 11.5–14.5)
WBC: 6.7 10*3/uL (ref 3.6–11.0)

## 2018-05-07 MED ORDER — SODIUM CHLORIDE 0.9 % IV SOLN
20.0000 mg | Freq: Once | INTRAVENOUS | Status: AC
  Administered 2018-05-07: 20 mg via INTRAVENOUS
  Filled 2018-05-07: qty 2

## 2018-05-07 MED ORDER — SODIUM CHLORIDE 0.9 % IV SOLN
150.0000 mg/m2 | Freq: Once | INTRAVENOUS | Status: AC
  Administered 2018-05-07: 294 mg via INTRAVENOUS
  Filled 2018-05-07: qty 49

## 2018-05-07 MED ORDER — DIPHENHYDRAMINE HCL 50 MG/ML IJ SOLN
50.0000 mg | Freq: Once | INTRAMUSCULAR | Status: AC
  Administered 2018-05-07: 50 mg via INTRAVENOUS
  Filled 2018-05-07: qty 1

## 2018-05-07 MED ORDER — FAMOTIDINE IN NACL 20-0.9 MG/50ML-% IV SOLN
20.0000 mg | Freq: Once | INTRAVENOUS | Status: AC
  Administered 2018-05-07: 20 mg via INTRAVENOUS
  Filled 2018-05-07: qty 50

## 2018-05-07 MED ORDER — PALONOSETRON HCL INJECTION 0.25 MG/5ML
0.2500 mg | Freq: Once | INTRAVENOUS | Status: AC
  Administered 2018-05-07: 0.25 mg via INTRAVENOUS
  Filled 2018-05-07: qty 5

## 2018-05-07 MED ORDER — HEPARIN SOD (PORK) LOCK FLUSH 100 UNIT/ML IV SOLN: 500.0000 [IU] | Freq: Once | INTRAVENOUS | Status: DC | PRN

## 2018-05-07 MED ORDER — SODIUM CHLORIDE 0.9% FLUSH
10.0000 mL | Freq: Once | INTRAVENOUS | Status: AC
  Administered 2018-05-07: 10 mL via INTRAVENOUS
  Filled 2018-05-07: qty 10

## 2018-05-07 MED ORDER — SODIUM CHLORIDE 0.9 % IV SOLN
430.5000 mg | Freq: Once | INTRAVENOUS | Status: AC
  Administered 2018-05-07: 430 mg via INTRAVENOUS
  Filled 2018-05-07: qty 43

## 2018-05-07 MED ORDER — HEPARIN SOD (PORK) LOCK FLUSH 100 UNIT/ML IV SOLN
500.0000 [IU] | Freq: Once | INTRAVENOUS | Status: AC
  Administered 2018-05-07: 500 [IU] via INTRAVENOUS
  Filled 2018-05-07: qty 5

## 2018-05-07 MED ORDER — PEGFILGRASTIM 6 MG/0.6ML ~~LOC~~ PSKT
6.0000 mg | PREFILLED_SYRINGE | Freq: Once | SUBCUTANEOUS | Status: AC
  Administered 2018-05-07: 6 mg via SUBCUTANEOUS
  Filled 2018-05-07: qty 0.6

## 2018-05-07 MED ORDER — SODIUM CHLORIDE 0.9 % IV SOLN
Freq: Once | INTRAVENOUS | Status: AC
  Administered 2018-05-07: 10:00:00 via INTRAVENOUS
  Filled 2018-05-07: qty 250

## 2018-05-08 ENCOUNTER — Ambulatory Visit (INDEPENDENT_AMBULATORY_CARE_PROVIDER_SITE_OTHER): Payer: Medicare Other | Admitting: Internal Medicine

## 2018-05-08 ENCOUNTER — Encounter: Payer: Self-pay | Admitting: Internal Medicine

## 2018-05-08 VITALS — BP 120/62 | HR 88 | Temp 98.4°F | Ht 66.0 in | Wt 186.4 lb

## 2018-05-08 DIAGNOSIS — Z0184 Encounter for antibody response examination: Secondary | ICD-10-CM

## 2018-05-08 DIAGNOSIS — E0500 Thyrotoxicosis with diffuse goiter without thyrotoxic crisis or storm: Secondary | ICD-10-CM | POA: Diagnosis not present

## 2018-05-08 DIAGNOSIS — E559 Vitamin D deficiency, unspecified: Secondary | ICD-10-CM

## 2018-05-08 DIAGNOSIS — E785 Hyperlipidemia, unspecified: Secondary | ICD-10-CM | POA: Diagnosis not present

## 2018-05-08 DIAGNOSIS — C561 Malignant neoplasm of right ovary: Secondary | ICD-10-CM | POA: Diagnosis not present

## 2018-05-08 DIAGNOSIS — N3281 Overactive bladder: Secondary | ICD-10-CM

## 2018-05-08 DIAGNOSIS — G629 Polyneuropathy, unspecified: Secondary | ICD-10-CM

## 2018-05-08 DIAGNOSIS — R600 Localized edema: Secondary | ICD-10-CM | POA: Insufficient documentation

## 2018-05-08 DIAGNOSIS — R739 Hyperglycemia, unspecified: Secondary | ICD-10-CM | POA: Diagnosis not present

## 2018-05-08 DIAGNOSIS — Z1159 Encounter for screening for other viral diseases: Secondary | ICD-10-CM

## 2018-05-08 DIAGNOSIS — N3001 Acute cystitis with hematuria: Secondary | ICD-10-CM | POA: Diagnosis not present

## 2018-05-08 MED ORDER — FUROSEMIDE 20 MG PO TABS: 20.0000 mg | ORAL_TABLET | Freq: Every day | ORAL | 2 refills | Status: DC

## 2018-05-08 NOTE — Progress Notes (Signed)
Chief Complaint  Patient presents with  . Establish Care   New Patient establish PSP  1. C/o b/l leg edema 1 year ago had [place] on one of legs and was negative for DVT. She has hose 15-20 mmHG to thigh at home but not wearing today and knee high hose caused imprints in her skin  2. 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 She has port and had 3 chemo treatments. H/O is Dr. Janese Banks. 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 H/o UTI proteus and overactive bladder Vesicare 5 mg is not helping  4. Reviewed labs 05/07/18 CMET and CBC slightly anemic 5. H/o vitamin D def on D3 50K IU weekly 02/13/18 vitamin D 21 and repeat increased slightly    6. H/o Graves on synthyroid 175 mcg we need to check TSH   Review of Systems  Constitutional: Negative for weight loss.  HENT: Negative for hearing loss.   Eyes: Negative for blurred vision.  Respiratory: Negative for shortness of breath.   Cardiovascular: Positive for leg swelling. Negative for chest pain.  Gastrointestinal: Negative for abdominal pain.  Genitourinary: Positive for frequency.       +overactive bladder and urgency   Musculoskeletal: Negative for falls.  Skin: Negative for rash.  Neurological: Positive for sensory change.  Psychiatric/Behavioral: Negative for depression. The patient is nervous/anxious.    Past Medical History:  Diagnosis Date  . Cancer [facility]) 2019   right sided ovarian cancer  . Hyperlipidemia   . Overactive bladder   . Thyroid disease   . UTI (urinary tract infection)   . Vitamin D deficiency    Past Surgical History:  Procedure Laterality Date  . ABDOMINAL HYSTERECTOMY     02/15/18   . CHOLECYSTECTOMY  2008  . LAPAROSCOPIC VAGINAL HYSTERECTOMY WITH SALPINGO OOPHORECTOMY  02/15/2018  .  PORTACATH PLACEMENT Left 02/25/2018   Procedure: INSERTION PORT-A-CATH;  Surgeon: Robert Bellow, MD;  Location: [facility] ORS;  Service: General;  Laterality: Left;  . TOTAL ABDOMINAL HYSTERECTOMY W/ BILATERAL SALPINGOOPHORECTOMY  02/15/2018   lymph nodes removed   Family History  Problem Relation Age of Onset  . Sudden death Mother 89       drowning in hurricane Katrina  . Pancreatic cancer Father        deceased early 38s; smoker  . Alcohol abuse Father   . COPD Father   . Coronary artery disease Brother   . Melanoma Sister        deceased at 9  . COPD Sister   . Prostate cancer Maternal Uncle        deceased 9  . Breast cancer Paternal Aunt        unsure of this dx   Social History   Socioeconomic History  . Marital status: Widowed    Spouse name: Not on file  . Number of children: Not on file  . Years of education: Not on file  . Highest education level: Not on file  Occupational History  . Not on file  Social Needs  . Financial resource strain: Not on file  . Food insecurity:    Worry: Not on file    Inability: Not on file  . Transportation needs:    Medical: Not on file  Non-medical: Not on file  Tobacco Use  . Smoking status: Former Smoker    Packs/day: 1.00    Years: 25.00    Pack years: 25.00    Types: Cigarettes    Last attempt to quit: 02/26/1984    Years since quitting: 34.2  . Smokeless tobacco: Never Used  . Tobacco comment: age 63 to 62 1 ppd   Substance and Sexual Activity  . Alcohol use: Never    Frequency: Never  . Drug use: Never  . Sexual activity: Not Currently  Lifestyle  . Physical activity:    Days per week: Not on file    Minutes per session: Not on file  . Stress: Not on file  Relationships  . Social connections:    Talks on phone: Not on file    Gets together: Not on file    Attends religious service: Not on file    Active member of club or organization: Not on file    Attends meetings of clubs or organizations: Not on file     Relationship status: Not on file  . Intimate partner violence:    Fear of current or ex partner: Not on file    Emotionally abused: Not on file    Physically abused: Not on file    Forced sexual activity: Not on file  Other Topics Concern  . Not on file  Social History Narrative   Engineer, maintenance (IT), bachelors degree    Lives in [place] here visiting daughter [name]    Former smoker quit age 37 y.o    No outpatient medications have been marked as taking for the 05/08/18 encounter (Office Visit) with McLean-Scocuzza, Nino Glow, MD.   Allergies  Allergen Reactions  . Epinephrine Other (See Comments)    Funny feeling, heart racing   . Lipitor [Atorvastatin Calcium]     Myalgias     Recent Results (from the past 2160 hour(s))  Comprehensive metabolic panel     Status: Abnormal   Collection Time: 03/21/18  8:32 AM  Result Value Ref Range   Sodium 145 135 - 145 mmol/L   Potassium 4.3 3.5 - 5.1 mmol/L   Chloride 110 98 - 111 mmol/L   CO2 25 22 - 32 mmol/L   Glucose, Bld 125 (H) 70 - 99 mg/dL   BUN 24 (H) 8 - 23 mg/dL   Creatinine, Ser 0.71 0.44 - 1.00 mg/dL   Calcium 9.0 8.9 - 10.3 mg/dL   Total Protein 6.4 (L) 6.5 - 8.1 g/dL   Albumin 3.6 3.5 - 5.0 g/dL   AST 20 15 - 41 U/L   ALT 17 0 - 44 U/L   Alkaline Phosphatase 59 38 - 126 U/L   Total Bilirubin 0.5 0.3 - 1.2 mg/dL   GFR calc non Af Amer >60 >60 mL/min   GFR calc Af Amer >60 >60 mL/min    Comment: (NOTE) The eGFR has been calculated using the CKD EPI equation. This calculation has not been validated in all clinical situations. eGFR's persistently <60 mL/min signify possible Chronic Kidney Disease.    Anion gap 10 5 - 15    Comment: Performed at [facility], [address] [place], [place] [address]  CBC with Differential     Status: None   Collection Time: 03/21/18  8:32 AM  Result Value Ref Range   WBC 6.3 3.6 - 11.0 K/uL   RBC 3.93 3.80 - 5.20 MIL/uL   Hemoglobin 12.3 12.0 - 16.0 g/dL  HCT 37.0 35.0 - 47.0 %    MCV 94.1 80.0 - 100.0 fL   MCH 31.4 26.0 - 34.0 pg   MCHC 33.4 32.0 - 36.0 g/dL   RDW 13.9 11.5 - 14.5 %   Platelets 253 150 - 440 K/uL   Neutrophils Relative % 69 %   Neutro Abs 4.3 1.4 - 6.5 K/uL   Lymphocytes Relative 16 %   Lymphs Abs 1.0 1.0 - 3.6 K/uL   Monocytes Relative 9 %   Monocytes Absolute 0.6 0.2 - 0.9 K/uL   Eosinophils Relative 5 %   Eosinophils Absolute 0.3 0 - 0.7 K/uL   Basophils Relative 1 %   Basophils Absolute 0.1 0 - 0.1 K/uL    Comment: Performed at [facility], [address] [place], [place] [address]  Vitamin D 25 hydroxy     Status: Abnormal   Collection Time: 04/02/18  9:12 AM  Result Value Ref Range   Vit D, 25-Hydroxy 24.8 (L) 30.0 - 100.0 ng/mL    Comment: (NOTE) Vitamin D deficiency has been defined by the Institute of Medicine and an Endocrine Society practice guideline as a level of serum 25-OH vitamin D less than 20 ng/mL (1,2). The Endocrine Society went on to further define vitamin D insufficiency as a level between 21 and 29 ng/mL (2). 1. IOM (Institute of Medicine). 2010. Dietary reference   intakes for calcium and D. [place]: The   [employer]. 2. Holick MF, Binkley Platter, Bischoff-Ferrari HA, et al.   Evaluation, treatment, and prevention of vitamin D   deficiency: an Endocrine Society clinical practice   guideline. JCEM. 2011 Jul; 96(7):1911-30. Performed At: [facility] [place], [place] [id number] Rush Farmer MD [id number]   Comprehensive metabolic panel     Status: Abnormal   Collection Time: 04/02/18  9:12 AM  Result Value Ref Range   Sodium 138 135 - 145 mmol/L   Potassium 5.0 3.5 - 5.1 mmol/L   Chloride 105 98 - 111 mmol/L   CO2 26 22 - 32 mmol/L   Glucose, Bld 93 70 - 99 mg/dL   BUN 31 (H) 8 - 23 mg/dL   Creatinine, Ser 0.70 0.44 - 1.00 mg/dL   Calcium 9.4 8.9 - 10.3 mg/dL   Total Protein 6.6 6.5 - 8.1 g/dL   Albumin 3.5 3.5 - 5.0 g/dL   AST 22 15 - 41 U/L    ALT 27 0 - 44 U/L   Alkaline Phosphatase 88 38 - 126 U/L   Total Bilirubin 0.4 0.3 - 1.2 mg/dL   GFR calc non Af Amer >60 >60 mL/min   GFR calc Af Amer >60 >60 mL/min    Comment: (NOTE) The eGFR has been calculated using the CKD EPI equation. This calculation has not been validated in all clinical situations. eGFR's persistently <60 mL/min signify possible Chronic Kidney Disease.    Anion gap 7 5 - 15    Comment: Performed at [facility], [address] [place], [place] [address]  CBC with Differential/Platelet     Status: Abnormal   Collection Time: 04/02/18  9:12 AM  Result Value Ref Range   WBC 14.3 (H) 3.6 - 11.0 K/uL   RBC 3.85 3.80 - 5.20 MIL/uL   Hemoglobin 11.9 (L) 12.0 - 16.0 g/dL   HCT 35.7 35.0 - 47.0 %   MCV 92.7 80.0 - 100.0 fL   MCH 30.8 26.0 - 34.0 pg   MCHC 33.2 32.0 -  36.0 g/dL   RDW 13.9 11.5 - 14.5 %   Platelets 240 150 - 440 K/uL   Neutrophils Relative % 83 %   Neutro Abs 11.9 (H) 1.4 - 6.5 K/uL   Lymphocytes Relative 10 %   Lymphs Abs 1.4 1.0 - 3.6 K/uL   Monocytes Relative 5 %   Monocytes Absolute 0.7 0.2 - 0.9 K/uL   Eosinophils Relative 1 %   Eosinophils Absolute 0.1 0 - 0.7 K/uL   Basophils Relative 1 %   Basophils Absolute 0.1 0 - 0.1 K/uL    Comment: Performed at [facility], [address] [place], [place] [address]  Comprehensive metabolic panel     Status: Abnormal   Collection Time: 04/16/18  9:14 AM  Result Value Ref Range   Sodium 141 135 - 145 mmol/L   Potassium 4.0 3.5 - 5.1 mmol/L   Chloride 109 98 - 111 mmol/L   CO2 25 22 - 32 mmol/L   Glucose, Bld 97 70 - 99 mg/dL   BUN 25 (H) 8 - 23 mg/dL   Creatinine, Ser 0.68 0.44 - 1.00 mg/dL   Calcium 8.9 8.9 - 10.3 mg/dL   Total Protein 6.6 6.5 - 8.1 g/dL   Albumin 3.9 3.5 - 5.0 g/dL   AST 17 15 - 41 U/L   ALT 15 0 - 44 U/L   Alkaline Phosphatase 65 38 - 126 U/L   Total Bilirubin 0.8 0.3 - 1.2 mg/dL   GFR calc non Af Amer >60 >60 mL/min   GFR calc Af Amer >60 >60  mL/min    Comment: (NOTE) The eGFR has been calculated using the CKD EPI equation. This calculation has not been validated in all clinical situations. eGFR's persistently <60 mL/min signify possible Chronic Kidney Disease.    Anion gap 7 5 - 15    Comment: Performed at [facility], [address] [place], [place] [address]  CBC with Differential/Platelet     Status: Abnormal   Collection Time: 04/16/18  9:14 AM  Result Value Ref Range   WBC 8.8 3.6 - 11.0 K/uL   RBC 4.04 3.80 - 5.20 MIL/uL   Hemoglobin 12.4 12.0 - 16.0 g/dL   HCT 37.8 35.0 - 47.0 %   MCV 93.6 80.0 - 100.0 fL   MCH 30.8 26.0 - 34.0 pg   MCHC 32.9 32.0 - 36.0 g/dL   RDW 14.6 (H) 11.5 - 14.5 %   Platelets 306 150 - 440 K/uL   Neutrophils Relative % 71 %   Neutro Abs 6.3 1.4 - 6.5 K/uL   Lymphocytes Relative 16 %   Lymphs Abs 1.4 1.0 - 3.6 K/uL   Monocytes Relative 8 %   Monocytes Absolute 0.7 0.2 - 0.9 K/uL   Eosinophils Relative 4 %   Eosinophils Absolute 0.4 0 - 0.7 K/uL   Basophils Relative 1 %   Basophils Absolute 0.1 0 - 0.1 K/uL    Comment: Performed at [facility], [address], [place], [place] [address]  Urine culture     Status: Abnormal   Collection Time: 04/16/18  9:41 AM  Result Value Ref Range   Specimen Description      URINE, CLEAN CATCH Performed at [facility], [address] [place], [place] [address]    Special Requests      NONE Performed at [facility], [address], [place], [place] [address]    Culture >=100,000 COLONIES/mL PROTEUS MIRABILIS (A)  Report Status 04/18/2018 FINAL    Organism ID, Bacteria PROTEUS MIRABILIS (A)       Susceptibility   Proteus mirabilis - MIC*    AMPICILLIN <=2 SENSITIVE Sensitive     CEFAZOLIN <=4 SENSITIVE Sensitive     CEFTRIAXONE <=1 SENSITIVE Sensitive     CIPROFLOXACIN <=0.25 SENSITIVE Sensitive     GENTAMICIN <=1 SENSITIVE Sensitive     IMIPENEM 4 SENSITIVE Sensitive     NITROFURANTOIN 128 RESISTANT  Resistant     TRIMETH/SULFA <=20 SENSITIVE Sensitive     AMPICILLIN/SULBACTAM <=2 SENSITIVE Sensitive     PIP/TAZO <=4 SENSITIVE Sensitive     * >=100,000 COLONIES/mL PROTEUS MIRABILIS  Urinalysis, Complete w Microscopic     Status: Abnormal   Collection Time: 04/16/18  9:41 AM  Result Value Ref Range   Color, Urine YELLOW (A) YELLOW   APPearance CLOUDY (A) CLEAR   Specific Gravity, Urine 1.020 1.005 - 1.030   pH 5.0 5.0 - 8.0   Glucose, UA NEGATIVE NEGATIVE mg/dL   Hgb urine dipstick SMALL (A) NEGATIVE   Bilirubin Urine NEGATIVE NEGATIVE   Ketones, ur NEGATIVE NEGATIVE mg/dL   Protein, ur NEGATIVE NEGATIVE mg/dL   Nitrite NEGATIVE NEGATIVE   Leukocytes, UA LARGE (A) NEGATIVE   RBC / HPF 6-10 0 - 5 RBC/hpf   WBC, UA >50 (H) 0 - 5 WBC/hpf   Bacteria, UA NONE SEEN NONE SEEN   Squamous Epithelial / LPF 6-10 0 - 5   Mucus PRESENT     Comment: Performed at [facility], [address] [place], [place] [address]  Vitamin B12     Status: Abnormal   Collection Time: 04/23/18 10:00 AM  Result Value Ref Range   Vitamin B-12 3,222 (H) 180 - 914 pg/mL    Comment: (NOTE) This assay is not validated for testing neonatal or myeloproliferative syndrome specimens for Vitamin B12 levels. Performed at [facility], [place] [address] [place], [place] [address]   Comprehensive metabolic panel     Status: Abnormal   Collection Time: 05/07/18  8:58 AM  Result Value Ref Range   Sodium 141 135 - 145 mmol/L   Potassium 4.1 3.5 - 5.1 mmol/L   Chloride 110 98 - 111 mmol/L   CO2 25 22 - 32 mmol/L   Glucose, Bld 80 70 - 99 mg/dL   BUN 19 8 - 23 mg/dL   Creatinine, Ser 0.58 0.44 - 1.00 mg/dL   Calcium 9.1 8.9 - 10.3 mg/dL   Total Protein 6.3 (L) 6.5 - 8.1 g/dL   Albumin 3.9 3.5 - 5.0 g/dL   AST 18 15 - 41 U/L   ALT 14 0 - 44 U/L   Alkaline Phosphatase 63 38 - 126 U/L   Total Bilirubin 0.8 0.3 - 1.2 mg/dL   GFR calc non Af Amer >60 >60 mL/min   GFR calc Af Amer >60 >60 mL/min     Comment: (NOTE) The eGFR has been calculated using the CKD EPI equation. This calculation has not been validated in all clinical situations. eGFR's persistently <60 mL/min signify possible Chronic Kidney Disease.    Anion gap 6 5 - 15    Comment: Performed at [facility], [address] [place], [place] [address]  CBC with Differential/Platelet     Status: Abnormal   Collection Time: 05/07/18  8:58 AM  Result Value Ref Range   WBC 6.7 3.6 - 11.0 K/uL   RBC 3.85 3.80 - 5.20 MIL/uL  Hemoglobin 11.9 (L) 12.0 - 16.0 g/dL   HCT 35.9 35.0 - 47.0 %   MCV 93.2 80.0 - 100.0 fL   MCH 30.9 26.0 - 34.0 pg   MCHC 33.2 32.0 - 36.0 g/dL   RDW 16.2 (H) 11.5 - 14.5 %   Platelets 269 150 - 440 K/uL   Neutrophils Relative % 69 %   Neutro Abs 4.7 1.4 - 6.5 K/uL   Lymphocytes Relative 20 %   Lymphs Abs 1.3 1.0 - 3.6 K/uL   Monocytes Relative 9 %   Monocytes Absolute 0.6 0.2 - 0.9 K/uL   Eosinophils Relative 2 %   Eosinophils Absolute 0.1 0 - 0.7 K/uL   Basophils Relative 0 %   Basophils Absolute 0.0 0 - 0.1 K/uL    Comment: Performed at [facility], [address] [place], [place] [address]   Objective  Body mass index is 30.09 kg/m. Wt Readings from Last 3 Encounters:  05/08/18 186 lb 6.4 oz (84.6 kg)  05/07/18 185 lb 3.2 oz (84 kg)  04/23/18 179 lb 4.8 oz (81.3 kg)   Temp Readings from Last 3 Encounters:  05/08/18 98.4 F (36.9 C) (Oral)  05/07/18 98.3 F (36.8 C) (Tympanic)   BP Readings from Last 3 Encounters:  05/08/18 120/62  05/07/18 127/83  04/23/18 106/71   Pulse Readings from Last 3 Encounters:  05/08/18 88  05/07/18 75  04/23/18 89    Physical Exam  Constitutional: She is oriented to person, place, and time. Vital signs are normal. She appears well-developed and well-nourished. She is cooperative.  HENT:  Head: Normocephalic and atraumatic.  Mouth/Throat: Oropharynx is clear and moist and mucous membranes are normal.  Eyes: Pupils are equal,  round, and reactive to light. Conjunctivae are normal.  Cardiovascular: Normal rate, regular rhythm and normal heart sounds.  Pulmonary/Chest: Effort normal and breath sounds normal.  Neurological: She is alert and oriented to person, place, and time. Gait normal.  Skin: Skin is warm, dry and intact.  Psychiatric: She has a normal mood and affect. Her speech is normal and behavior is normal. Judgment and thought content normal. Cognition and memory are normal.  Nursing note and vitals reviewed.   Assessment   1. History of ovarian cancer s/p total hysterectomy (see below CA 125 18, CA 19-9 8, CEA <0.5 02/05/18  2. Leg edema b/l  3. Vitamin D def  4. H/o graves  5. H/o HLD  6. Overactive bladder  And urgency no relief vesicare 5 mg qd, h/o UTI  7. 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 HM  Plan  1. F/u H/o Dr. Janese Banks  2.  Check D dimer if elevated do [place] b/l legs given h/o malignancy  Will order echo [facility] Trial of lasix 20 mg  3. D3 50K weekly for now then 5000 IU qd after 6 months ~ 07/2018  4. Check TSH synthyriod 175 mcg  5. Check fasting lipid  6. Pending urology appt can disc myrbetriq with them given info about  UA and culture repeat  7. Cont cymbalta 30 titrate up to 60  8.  sch fasting labs   Emailed H/O to see if can get flu shot on chem Tdap per pt had 5 years ago get record pt to get record  Prevnar, pna 23, shingles vaccines  -pt to track down records.   Former smoker max 1 ppd from age 62 to 22 no FH lung cancer   Out of age window pap s/p total  hysterectomy 02/15/18 for ovarian cancer with b/l ovaries and left fallopian tube effected negative b/l lymph nodes 3 LN bx'ed   Disc cologaurd today never had colonoscopy referred today cologuard mail to daughters house   Mammogram per pt had 6 months ago in [place] requested records today   [facility] had in the past requested records.   Provider: Dr. Olivia Mackie McLean-Scocuzza-Internal Medicine

## 2018-05-08 NOTE — Progress Notes (Signed)
Pre visit review using our clinic review tool, if applicable. No additional management support is needed unless otherwise documented below in the visit note. 

## 2018-05-08 NOTE — Progress Notes (Signed)
Hematology/Oncology Consult note [facility]  [phone] [phone]  Patient Care Team: McLean-Scocuzza, Nino Glow, MD as PCP - General (Internal Medicine)   Name of the patient: Melissa Anderson  [id number]  February 18, 1941   Date of visit: 05/08/18  Diagnosis- Stage II ovarian cancer  Chief complaint/ Reason for visit-on treatment assessment prior to cycle 2 of adjuvant carboplatin and Taxol chemotherapy  Heme/Onc history: patient is a 77 year old female with a past medical history significant for hyperlipidemia and Graves' disease. She was recently complaining of back pain which led to an MRI of the lumbosacral spine. MRI in March 2019 which incidentally showed a multicystic 4 x 5.5 cm right adnexal mass with blood fluid levels. Prominent uterine endometrial stripe.  This was followed by a pelvic ultrasound in April 2019 which showed uterus measuring 9.7 x 4.1 x 5.9 cm. Multiloculated complex cystic structure in the right adnexa measuring 6.3 x 6.1 x 4.7 cm. It has solid-appearing components as well as anechoic portions separated by thick septations. No normal ovarian parenchyma is identified. No pelvic free fluid. Left ovary was not seen.  This was not further evaluated until patient's met with her gynecologist and was then referred to gynecology oncology Dr. Carlton Adam at [facility]. 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 Theora Gianotti on 02/12/2018.  She had a CT scan of her abdomen and pelvis on 02/09/2018 which showed multiseptated complex cystic lesion in the right pelvis. At adnexa immediately superior to the uterus measuring 6.9 x 5.3 cm. Abnormal thickening of the endometrial cavity measuring 1.3 cm. 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 Tumor is positive for PAX 8, WT 1 and p53. 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 3 lymph nodes negative for malignancy on the right and the left side. Omental biopsy was negative. Peritoneal biopsy was negative for malignancy. pT2a pN0  Baseline tumor markers including CA125 normal at 18  Cycle 1 of chemotherapy was given on 03/21/2018   Interval history-patient reports tolerating chemotherapy well except for symptoms of peripheral neuropathy.  She reports tingling numbness and discomfort in her bilateral feet which is slowly creeping up to her mid legs.  She did not have any of these symptoms prior to chemotherapy.  She does have some chronic right lower extremity pain for which she is also going to be evaluated by neurology next week.  For her neuropathy she had not taken Cymbalta.  She does report feeling fatigued after chemotherapy and tends to rest a lot for the first few days after treatment.    She also reports chronic symptoms of bladder pressure and incontinence for which she was prescribed Vesicare tablet by GYN in [place].  She states that this tablet is not helping and she would like to be evaluated by urology for this  ECOG PS- 1 Pain scale- 0 Opioid associated constipation- no  Review of systems- Review of Systems  Constitutional: Positive for malaise/fatigue. Negative for chills, fever and weight loss.  HENT: Negative for congestion, ear discharge and nosebleeds.  Eyes: Negative for blurred vision.  Respiratory: Negative for cough, hemoptysis, sputum production, shortness of breath and wheezing.   Cardiovascular: Negative for chest pain, palpitations,  orthopnea and claudication.  Gastrointestinal: Negative for abdominal pain, blood in stool, constipation, diarrhea, heartburn, melena, nausea and vomiting.  Genitourinary: Negative for dysuria, flank pain, frequency, hematuria and urgency.  Musculoskeletal: Negative for back pain, joint pain and myalgias.  Skin: Negative for rash.  Neurological: Positive for tingling. Negative for dizziness, focal weakness, seizures, weakness and headaches.  Endo/Heme/Allergies: Does not bruise/bleed easily.  Psychiatric/Behavioral: Negative for depression and suicidal ideas. The patient does not have insomnia.      Allergies  Allergen Reactions  . Epinephrine Other (See Comments)    Funny feeling, heart racing   . Lipitor [Atorvastatin Calcium]     Myalgias       Past Medical History:  Diagnosis Date  . Cancer [facility]) 2019   right sided ovarian cancer  . Hyperlipidemia   . Thyroid disease      Past Surgical History:  Procedure Laterality Date  . CHOLECYSTECTOMY  2008  . LAPAROSCOPIC VAGINAL HYSTERECTOMY WITH SALPINGO OOPHORECTOMY  02/15/2018  . PORTACATH PLACEMENT Left 02/25/2018   Procedure: INSERTION PORT-A-CATH;  Surgeon: Robert Bellow, MD;  Location: [facility] ORS;  Service: General;  Laterality: Left;  . TOTAL ABDOMINAL HYSTERECTOMY W/ BILATERAL SALPINGOOPHORECTOMY  02/15/2018   lymph nodes removed    Social History   Socioeconomic History  . Marital status: Widowed    Spouse name: Not on file  . Number of children: Not on file  . Years of education: Not on file  . Highest education level: Not on file  Occupational History  . Not on file  Social Needs  . Financial resource strain: Not on file  . Food insecurity:    Worry: Not on file    Inability: Not on file  . Transportation needs:    Medical: Not on file    Non-medical: Not on file  Tobacco Use  . Smoking status: Former Smoker    Packs/day: 1.00    Years: 25.00    Pack years: 25.00    Types: Cigarettes    Last  attempt to quit: 02/26/1984    Years since quitting: 34.2  . Smokeless tobacco: Never Used  Substance and Sexual Activity  . Alcohol use: Never    Frequency: Never  . Drug use: Never  . Sexual activity: Not on file  Lifestyle  . Physical activity:    Days per week: Not on file    Minutes per session: Not on file  . Stress: Not on file  Relationships  . Social connections:    Talks on phone: Not on file    Gets together: Not on file    Attends religious service: Not on file    Active member of club or organization: Not on file    Attends meetings of clubs or organizations: Not on file    Relationship status: Not on file  . Intimate partner violence:    Fear of current or ex partner: Not on file    Emotionally abused: Not on file    Physically abused: Not on file    Forced sexual activity: Not on file  Other Topics Concern  . Not on file  Social History Narrative  . Not on file    Family History  Problem Relation Age of Onset  . Sudden death Mother 69       drowning in hurricane Katrina  .  Pancreatic cancer Father        deceased early 77s; smoker  . Coronary artery disease Brother   . Melanoma Sister        deceased at 66  . Prostate cancer Maternal Uncle        deceased 31  . Breast cancer Paternal Aunt        unsure of this dx     Current Outpatient Medications:  .  acetaminophen (TYLENOL) 500 MG tablet, Take 500 mg by mouth every 6 (six) hours as needed for moderate pain., Disp: , Rfl:  .  dexamethasone (DECADRON) 4 MG tablet, Take 2 tablets (8 mg total) by mouth daily. Start the day after chemotherapy for 2 days., Disp: 30 tablet, Rfl: 1 .  DULoxetine (CYMBALTA) 30 MG capsule, Take 1 capsule (30 mg total) by mouth daily. For 7 days and then 2 capsule daily for rest of month, Disp: 53 capsule, Rfl: 3 .  HYDROcodone-acetaminophen (NORCO/VICODIN) 5-325 MG tablet, Take 1 tablet by mouth every 4 (four) hours as needed for moderate pain., Disp: 45 tablet, Rfl: 0 .   lidocaine-prilocaine (EMLA) cream, Apply to affected area once, Disp: 30 g, Rfl: 3 .  LORazepam (ATIVAN) 0.5 MG tablet, Take 1 tablet (0.5 mg total) by mouth every 6 (six) hours as needed (Nausea or vomiting)., Disp: 30 tablet, Rfl: 0 .  ondansetron (ZOFRAN) 8 MG tablet, Take 1 tablet (8 mg total) by mouth 2 (two) times daily as needed for refractory nausea / vomiting. Start on day 3 after chemo., Disp: 30 tablet, Rfl: 1 .  OVER THE COUNTER MEDICATION, Obagi facial cleansing otc system - use as directed, Disp: , Rfl:  .  prochlorperazine (COMPAZINE) 10 MG tablet, Take 1 tablet (10 mg total) by mouth every 6 (six) hours as needed (Nausea or vomiting)., Disp: 30 tablet, Rfl: 1 .  rosuvastatin (CRESTOR) 10 MG tablet, TAKE 1 TABLET BY MOUTH EVERY DAY IN THE EVENING, Disp: 30 tablet, Rfl: 1 .  SYNTHROID 175 MCG tablet, Take 1 tablet (175 mcg total) by mouth daily., Disp: 30 tablet, Rfl: 0 .  VESICARE 5 MG tablet, Take 5 mg by mouth every evening. , Disp: , Rfl:  .  furosemide (LASIX) 20 MG tablet, Take 1 tablet (20 mg total) by mouth daily. In am, Disp: 30 tablet, Rfl: 2 .  Vitamin D, Ergocalciferol, (DRISDOL) 50000 units CAPS capsule, TAKE 1 CAPSULE (50,000 UNITS TOTAL) BY MOUTH EVERY WEDNESDAY., Disp: 4 capsule, Rfl: 0  Physical exam:  Vitals:   05/07/18 0938  BP: 127/83  Pulse: 75  Temp: 98.3 F (36.8 C)  TempSrc: Tympanic  Weight: 185 lb 3.2 oz (84 kg)   Physical Exam  Constitutional: She is oriented to person, place, and time. She appears well-developed and well-nourished.  HENT:  Head: Normocephalic and atraumatic.  Eyes: Pupils are equal, round, and reactive to light. EOM are normal.  Neck: Normal range of motion.  Cardiovascular: Normal rate, regular rhythm and normal heart sounds.  Pulmonary/Chest: Effort normal and breath sounds normal.  Abdominal: Soft. Bowel sounds are normal.  Musculoskeletal: She exhibits edema (Bilateral +1 edema).  Neurological: She is alert and oriented to  person, place, and time.  Skin: Skin is warm and dry.     CMP Latest Ref Rng & Units 05/07/2018  Glucose 70 - 99 mg/dL 80  BUN 8 - 23 mg/dL 19  Creatinine 0.44 - 1.00 mg/dL 0.58  Sodium 135 - 145 mmol/L 141  Potassium 3.5 - 5.1  mmol/L 4.1  Chloride 98 - 111 mmol/L 110  CO2 22 - 32 mmol/L 25  Calcium 8.9 - 10.3 mg/dL 9.1  Total Protein 6.5 - 8.1 g/dL 6.3(L)  Total Bilirubin 0.3 - 1.2 mg/dL 0.8  Alkaline Phos 38 - 126 U/L 63  AST 15 - 41 U/L 18  ALT 0 - 44 U/L 14   CBC Latest Ref Rng & Units 05/07/2018  WBC 3.6 - 11.0 K/uL 6.7  Hemoglobin 12.0 - 16.0 g/dL 11.9(L)  Hematocrit 35.0 - 47.0 % 35.9  Platelets 150 - 440 K/uL 269     Assessment and plan- Patient is a 77 y.o. female with stage II ovarian cancer status post surgery here for on treatment assessment prior to cycle 2 of adjuvant carboplatin and Taxol chemotherapy  Counts okay to proceed with cycle 2 of carboplatin and Taxol chemotherapy today with on pro-Neulasta support.  Due to ongoing symptoms of peripheral neuropathy I will dose reduce her Taxol to 150 mg/m.  Taxol-induced peripheral neuropathy: I have asked her to start taking her Cymbalta at this time to see if it helps her symptoms.  We will also make referral to acupuncture services.  She will also be using cooling the labs and socks during chemotherapy to see if it helps.  I will start her describe the symptoms with her neurologist next week as well to see if he has any other recommendations.  I have also encouraged her to continue physical activity and that will help her with her edema and neuropathy as well  I will see her back in 3 weeks time with CBC and CMP prior to cycle 3 of chemotherapy  I will refer her to urology for ongoing chronic symptoms of urinary incontinence   Visit Diagnosis 1. Urinary incontinence, unspecified type   2. 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 Encounter for antineoplastic chemotherapy      Dr. Randa Evens, MD, MPH [facility] at [facility] [phone] 05/08/2018 12:12 PM

## 2018-05-08 NOTE — Patient Instructions (Addendum)
D3 5000 IU daily starting 07/2018 when you finish current prescription Myrbetriq for overactive bladder discuss with urology   Think about Tdap vaccine  Touch base with old pharmacy get the names of vaccines and dates of vaccines please  I.e prevnar or pneumonia 23  I.e zostavax or shingrix vaccine  I.e Tetanus   Discuss Myrbetriq with urology  Start lasix 20 mg in am  Schedule fasting labs tomorrow  I ordered echo at [facility]  F/u in 2-3 weeks     Mirabegron extended-release tablets What is this medicine? MIRABEGRON (MIR a BEG ron) is used to treat overactive bladder. This medicine reduces the amount of bathroom visits. It may also help to control wetting accidents. This medicine may be used for other purposes; ask your health care provider or pharmacist if you have questions. COMMON BRAND NAME(S): Myrbetriq What should I tell my health care provider before I take this medicine? They need to know if you have any of these conditions: -difficulty passing urine -high blood pressure -kidney disease -liver disease -an unusual or allergic reaction to mirabegron, other medicines, foods, dyes, or preservatives -pregnant or trying to get pregnant -breast-feeding How should I use this medicine? Take this medicine by mouth with a glass of water. Follow the directions on the prescription label. Do not cut, crush or chew this medicine. You can take it with or without food. If it upsets your stomach, take it with food. Take your medicine at regular intervals. Do not take it more often than directed. Do not stop taking except on your doctor's advice. Talk to your pediatrician regarding the use of this medicine in children. Special care may be needed. Overdosage: If you think you have taken too much of this medicine contact a poison control center or emergency room at once. NOTE: This medicine is only for you. Do not share this medicine with others. What if I miss a dose? If you miss a  dose, take it as soon as you can. If it is almost time for your next dose, take only that dose. Do not take double or extra doses. What may interact with this medicine? -certain medicines for bladder problems like fesoterodine, oxybutynin, solifenacin, tolterodine -desipramine -digoxin -flecainide -ketoconazole -MAOIs like Carbex, Eldepryl, Marplan, Nardil, and Parnate -metoprolol -propafenone -thioridazine -warfarin This list may not describe all possible interactions. Give your health care provider a list of all the medicines, herbs, non-prescription drugs, or dietary supplements you use. Also tell them if you smoke, drink alcohol, or use illegal drugs. Some items may interact with your medicine. What should I watch for while using this medicine? It may take 8 weeks to notice the full benefit from this medicine. You may need to limit your intake tea, coffee, caffeinated sodas, and alcohol. These drinks may make your symptoms worse. Visit your doctor or health care professional for regular checks on your progress. Check your blood pressure as directed. Ask your doctor or health care professional what your blood pressure should be and when you should contact him or her. What side effects may I notice from receiving this medicine? Side effects that you should report to your doctor or health care professional as soon as possible: -allergic reactions like skin rash, itching or hives, swelling of the face, lips, or tongue -chest pain or palpitations -severe or sudden headache -high blood pressure -fast, irregular heartbeat -redness, blistering, peeling or loosening of the skin, including inside the mouth -signs of infection like fever or chills; cough; sore  throat; pain or difficulty passing urine -trouble passing urine or change in the amount of urine Side effects that usually do not require medical attention (report to your doctor or health care professional if they continue or are  bothersome): -constipation -diarrhea -dizziness -dry eyes -joint pain -mild headache -nausea -runny nose This list may not describe all possible side effects. Call your doctor for medical advice about side effects. You may report side effects to FDA at 1-800-FDA-1088. Where should I keep my medicine? Keep out of the reach of children. Store at room temperature between 15 and 30 degrees C (59 and 86 degrees F). Throw away any unused medicine after the expiration date. NOTE: This sheet is a summary. It may not cover all possible information. If you have questions about this medicine, talk to your doctor, pharmacist, or health care provider.  2018 Elsevier/Gold Standard (2015-09-16 12:14:30)

## 2018-05-09 ENCOUNTER — Other Ambulatory Visit: Payer: Self-pay | Admitting: Internal Medicine

## 2018-05-09 ENCOUNTER — Other Ambulatory Visit: Payer: Self-pay | Admitting: Oncology

## 2018-05-09 ENCOUNTER — Other Ambulatory Visit (INDEPENDENT_AMBULATORY_CARE_PROVIDER_SITE_OTHER): Payer: Medicare Other

## 2018-05-09 ENCOUNTER — Telehealth: Payer: Self-pay | Admitting: Internal Medicine

## 2018-05-09 DIAGNOSIS — E785 Hyperlipidemia, unspecified: Secondary | ICD-10-CM

## 2018-05-09 DIAGNOSIS — Z0184 Encounter for antibody response examination: Secondary | ICD-10-CM | POA: Diagnosis not present

## 2018-05-09 DIAGNOSIS — Z1159 Encounter for screening for other viral diseases: Secondary | ICD-10-CM | POA: Diagnosis not present

## 2018-05-09 DIAGNOSIS — R600 Localized edema: Secondary | ICD-10-CM | POA: Diagnosis not present

## 2018-05-09 DIAGNOSIS — N3001 Acute cystitis with hematuria: Secondary | ICD-10-CM

## 2018-05-09 DIAGNOSIS — R739 Hyperglycemia, unspecified: Secondary | ICD-10-CM

## 2018-05-09 DIAGNOSIS — E0500 Thyrotoxicosis with diffuse goiter without thyrotoxic crisis or storm: Secondary | ICD-10-CM | POA: Diagnosis not present

## 2018-05-09 DIAGNOSIS — R7989 Other specified abnormal findings of blood chemistry: Secondary | ICD-10-CM

## 2018-05-09 LAB — D-DIMER, QUANTITATIVE (NOT AT ARMC): D DIMER QUANT: 1.74 ug{FEU}/mL — AB (ref <0.50)

## 2018-05-09 NOTE — Telephone Encounter (Signed)
I will refill this time but she needs them to be refilled by her pcp as she has established care with pcp recently

## 2018-05-09 NOTE — Progress Notes (Signed)
Inform patient cancer doctor said   Yes she can have flu shot. Ideally during her 3rd week after chemo.her chemo is every 3 weeks. She has 3 more cycles to go   She can get it here or the pharmacy according to these instructions [place]

## 2018-05-09 NOTE — Telephone Encounter (Signed)
Call pt + D dimer rec Ultrasound b/l legs rule out DVT/bloot clot in legs  TMS

## 2018-05-09 NOTE — Addendum Note (Signed)
Addended by: Arby Barrette on: 05/09/2018 09:38 AM   Modules accepted: Orders

## 2018-05-09 NOTE — Telephone Encounter (Signed)
Patient was informed of results via mychart 

## 2018-05-10 ENCOUNTER — Telehealth: Payer: Self-pay | Admitting: Internal Medicine

## 2018-05-10 ENCOUNTER — Ambulatory Visit
Admission: RE | Admit: 2018-05-10 | Discharge: 2018-05-10 | Disposition: A | Discharge status: Home / Self Care | Payer: Medicare Other | Source: Ambulatory Visit | Attending: Internal Medicine | Admitting: Internal Medicine

## 2018-05-10 DIAGNOSIS — R7989 Other specified abnormal findings of blood chemistry: Secondary | ICD-10-CM | POA: Insufficient documentation

## 2018-05-10 DIAGNOSIS — R600 Localized edema: Secondary | ICD-10-CM | POA: Diagnosis not present

## 2018-05-10 LAB — URINALYSIS, ROUTINE W REFLEX MICROSCOPIC
Bilirubin, UA: NEGATIVE
Glucose, UA: NEGATIVE
Ketones, UA: NEGATIVE
Leukocytes, UA: NEGATIVE
Nitrite, UA: NEGATIVE
PH UA: 5 (ref 5.0–7.5)
PROTEIN UA: NEGATIVE
RBC, UA: NEGATIVE
Specific Gravity, UA: 1.022 (ref 1.005–1.030)
Urobilinogen, Ur: 0.2 mg/dL (ref 0.2–1.0)

## 2018-05-10 LAB — TSH: TSH: 0.99 mIU/L (ref 0.40–4.50)

## 2018-05-10 LAB — HEMOGLOBIN A1C
EAG (MMOL/L): 5.8 (calc)
Hgb A1c MFr Bld: 5.3 % of total Hgb (ref <5.7)
Mean Plasma Glucose: 105 (calc)

## 2018-05-10 LAB — LIPID PANEL
CHOLESTEROL: 184 mg/dL (ref <200)
HDL: 55 mg/dL (ref >50)
LDL CHOLESTEROL (CALC): 109 mg/dL — AB
Non-HDL Cholesterol (Calc): 129 mg/dL (calc) (ref <130)
TRIGLYCERIDES: 108 mg/dL (ref <150)
Total CHOL/HDL Ratio: 3.3 (calc) (ref <5.0)

## 2018-05-10 LAB — MEASLES/MUMPS/RUBELLA IMMUNITY
Mumps IgG: >300 AU/mL
Rubella: <0.9 index — ABNORMAL LOW
Rubeola IgG: >300 AU/mL

## 2018-05-10 NOTE — Telephone Encounter (Signed)
Pt wants to be contacted to be informed on where to go for the U/S, pt informed to go to [facility] but pt wants to speak w/ pcp/nurse

## 2018-05-10 NOTE — Telephone Encounter (Signed)
Results called from [facility] for venous US which negative for DVT results in Epic.

## 2018-05-10 NOTE — Telephone Encounter (Signed)
Noted inform pt   [place]

## 2018-05-11 LAB — URINE CULTURE

## 2018-05-13 ENCOUNTER — Telehealth: Payer: Self-pay | Admitting: *Deleted

## 2018-05-13 NOTE — Telephone Encounter (Signed)
Patient called to question her appointments time and date with Dr. Melrose Nakayama.  Patient given contact information for Dr. Weber Cooks office, phone and address. Voiced understanding.

## 2018-05-14 DIAGNOSIS — G620 Drug-induced polyneuropathy: Secondary | ICD-10-CM | POA: Diagnosis not present

## 2018-05-14 DIAGNOSIS — T451X5A Adverse effect of antineoplastic and immunosuppressive drugs, initial encounter: Secondary | ICD-10-CM | POA: Insufficient documentation

## 2018-05-14 DIAGNOSIS — R202 Paresthesia of skin: Secondary | ICD-10-CM | POA: Diagnosis not present

## 2018-05-14 DIAGNOSIS — R200 Anesthesia of skin: Secondary | ICD-10-CM | POA: Diagnosis not present

## 2018-05-15 DIAGNOSIS — R202 Paresthesia of skin: Secondary | ICD-10-CM | POA: Diagnosis not present

## 2018-05-15 DIAGNOSIS — M79604 Pain in right leg: Secondary | ICD-10-CM | POA: Diagnosis not present

## 2018-05-15 DIAGNOSIS — R200 Anesthesia of skin: Secondary | ICD-10-CM | POA: Diagnosis not present

## 2018-05-16 ENCOUNTER — Ambulatory Visit
Admission: RE | Admit: 2018-05-16 | Discharge: 2018-05-16 | Disposition: A | Discharge status: Home / Self Care | Payer: Medicare Other | Source: Ambulatory Visit | Attending: Internal Medicine | Admitting: Internal Medicine

## 2018-05-16 DIAGNOSIS — R600 Localized edema: Secondary | ICD-10-CM | POA: Diagnosis not present

## 2018-05-16 DIAGNOSIS — E559 Vitamin D deficiency, unspecified: Secondary | ICD-10-CM | POA: Insufficient documentation

## 2018-05-16 DIAGNOSIS — I517 Cardiomegaly: Secondary | ICD-10-CM | POA: Diagnosis not present

## 2018-05-16 DIAGNOSIS — Z9221 Personal history of antineoplastic chemotherapy: Secondary | ICD-10-CM | POA: Diagnosis not present

## 2018-05-16 DIAGNOSIS — Z8543 Personal history of malignant neoplasm of ovary: Secondary | ICD-10-CM | POA: Diagnosis not present

## 2018-05-16 DIAGNOSIS — E785 Hyperlipidemia, unspecified: Secondary | ICD-10-CM | POA: Insufficient documentation

## 2018-05-16 NOTE — Progress Notes (Signed)
*  PRELIMINARY RESULTS* Echocardiogram 2D Echocardiogram has been performed.  Melissa Anderson 05/16/2018, 10:57 AM

## 2018-05-17 ENCOUNTER — Telehealth: Payer: Self-pay | Admitting: *Deleted

## 2018-05-17 NOTE — Telephone Encounter (Signed)
Called office and got a new pt appt for October 7 at 10 am. I faxed info to the office at [phone]

## 2018-05-23 ENCOUNTER — Inpatient Hospital Stay: Payer: Medicare Other

## 2018-05-23 ENCOUNTER — Telehealth: Payer: Self-pay | Admitting: *Deleted

## 2018-05-23 DIAGNOSIS — Z5111 Encounter for antineoplastic chemotherapy: Secondary | ICD-10-CM | POA: Diagnosis not present

## 2018-05-23 DIAGNOSIS — C7961 Secondary malignant neoplasm of right ovary: Secondary | ICD-10-CM | POA: Diagnosis not present

## 2018-05-23 DIAGNOSIS — C7962 Secondary malignant neoplasm of left ovary: Secondary | ICD-10-CM | POA: Diagnosis not present

## 2018-05-23 DIAGNOSIS — T451X5S Adverse effect of antineoplastic and immunosuppressive drugs, sequela: Secondary | ICD-10-CM | POA: Diagnosis not present

## 2018-05-23 DIAGNOSIS — C561 Malignant neoplasm of right ovary: Secondary | ICD-10-CM

## 2018-05-23 DIAGNOSIS — C5702 Malignant neoplasm of left fallopian tube: Secondary | ICD-10-CM | POA: Diagnosis not present

## 2018-05-23 DIAGNOSIS — G620 Drug-induced polyneuropathy: Secondary | ICD-10-CM | POA: Diagnosis not present

## 2018-05-23 LAB — COMPREHENSIVE METABOLIC PANEL
ALT: 18 U/L (ref 0–44)
ANION GAP: 6 (ref 5–15)
AST: 21 U/L (ref 15–41)
Albumin: 3.8 g/dL (ref 3.5–5.0)
Alkaline Phosphatase: 80 U/L (ref 38–126)
BILIRUBIN TOTAL: 0.7 mg/dL (ref 0.3–1.2)
BUN: 26 mg/dL — ABNORMAL HIGH (ref 8–23)
CO2: 26 mmol/L (ref 22–32)
Calcium: 9.2 mg/dL (ref 8.9–10.3)
Chloride: 108 mmol/L (ref 98–111)
Creatinine, Ser: 0.94 mg/dL (ref 0.44–1.00)
GFR calc Af Amer: >60 mL/min (ref >60)
GFR, EST NON AFRICAN AMERICAN: 57 mL/min — AB (ref >60)
Glucose, Bld: 99 mg/dL (ref 70–99)
POTASSIUM: 4 mmol/L (ref 3.5–5.1)
Sodium: 140 mmol/L (ref 135–145)
TOTAL PROTEIN: 6.3 g/dL — AB (ref 6.5–8.1)

## 2018-05-23 LAB — CBC WITH DIFFERENTIAL/PLATELET
BASOS ABS: 0.1 10*3/uL (ref 0–0.1)
Basophils Relative: 1 %
Eosinophils Absolute: 0.1 10*3/uL (ref 0–0.7)
Eosinophils Relative: 1 %
HEMATOCRIT: 35 % (ref 35.0–47.0)
Hemoglobin: 11.6 g/dL — ABNORMAL LOW (ref 12.0–16.0)
LYMPHS PCT: 18 %
Lymphs Abs: 1.3 10*3/uL (ref 1.0–3.6)
MCH: 31.3 pg (ref 26.0–34.0)
MCHC: 33.2 g/dL (ref 32.0–36.0)
MCV: 94.3 fL (ref 80.0–100.0)
Monocytes Absolute: 0.7 10*3/uL (ref 0.2–0.9)
Monocytes Relative: 10 %
NEUTROS ABS: 5.1 10*3/uL (ref 1.4–6.5)
Neutrophils Relative %: 70 %
PLATELETS: 222 10*3/uL (ref 150–440)
RBC: 3.72 MIL/uL — ABNORMAL LOW (ref 3.80–5.20)
RDW: 16.7 % — ABNORMAL HIGH (ref 11.5–14.5)
WBC: 7.3 10*3/uL (ref 3.6–11.0)

## 2018-05-23 NOTE — Telephone Encounter (Signed)
Came in today to get her lab work prior to have her dental procedure in the a.m.  Patient requests that the lab results be sent to Dr. Josetta Huddle office.  I called Dr. Josetta Huddle office spoke with Velna Hatchet she gave me the fax number of [phone].  I received fax confirmation that the fax did go through.

## 2018-05-24 ENCOUNTER — Telehealth: Payer: Self-pay | Admitting: *Deleted

## 2018-05-24 NOTE — Telephone Encounter (Signed)
Call returned to patient and she was advised of Dr Elroy Channel response, she thanked me for calling her back

## 2018-05-24 NOTE — Telephone Encounter (Signed)
Patient called asking that Judeen Hammans return her call regarding acupuncture as discussed. She wants to know if Monday is too soon to get it? Pleas eadvise

## 2018-05-24 NOTE — Telephone Encounter (Signed)
It is ok for her to receive acupuncture on Monday and during chemotherapy

## 2018-05-28 ENCOUNTER — Inpatient Hospital Stay: Payer: Medicare Other | Attending: Oncology

## 2018-05-28 ENCOUNTER — Encounter: Payer: Self-pay | Admitting: Oncology

## 2018-05-28 ENCOUNTER — Inpatient Hospital Stay: Payer: Medicare Other

## 2018-05-28 ENCOUNTER — Inpatient Hospital Stay (HOSPITAL_BASED_OUTPATIENT_CLINIC_OR_DEPARTMENT_OTHER): Payer: Medicare Other | Admitting: Oncology

## 2018-05-28 VITALS — BP 118/73 | HR 86 | Temp 97.7°F | Resp 18 | Ht 66.0 in | Wt 184.6 lb

## 2018-05-28 DIAGNOSIS — T451X5A Adverse effect of antineoplastic and immunosuppressive drugs, initial encounter: Secondary | ICD-10-CM | POA: Diagnosis not present

## 2018-05-28 DIAGNOSIS — G620 Drug-induced polyneuropathy: Secondary | ICD-10-CM

## 2018-05-28 DIAGNOSIS — Z79899 Other long term (current) drug therapy: Secondary | ICD-10-CM | POA: Diagnosis not present

## 2018-05-28 DIAGNOSIS — C561 Malignant neoplasm of right ovary: Secondary | ICD-10-CM

## 2018-05-28 DIAGNOSIS — Z5111 Encounter for antineoplastic chemotherapy: Secondary | ICD-10-CM | POA: Diagnosis not present

## 2018-05-28 DIAGNOSIS — Z7689 Persons encountering health services in other specified circumstances: Secondary | ICD-10-CM | POA: Diagnosis not present

## 2018-05-28 LAB — COMPREHENSIVE METABOLIC PANEL
ALBUMIN: 3.6 g/dL (ref 3.5–5.0)
ALT: 16 U/L (ref 0–44)
AST: 22 U/L (ref 15–41)
Alkaline Phosphatase: 73 U/L (ref 38–126)
Anion gap: 7 (ref 5–15)
BUN: 20 mg/dL (ref 8–23)
CHLORIDE: 108 mmol/L (ref 98–111)
CO2: 29 mmol/L (ref 22–32)
CREATININE: 0.58 mg/dL (ref 0.44–1.00)
Calcium: 9.1 mg/dL (ref 8.9–10.3)
GFR calc Af Amer: >60 mL/min (ref >60)
GFR calc non Af Amer: >60 mL/min (ref >60)
GLUCOSE: 111 mg/dL — AB (ref 70–99)
Potassium: 3.6 mmol/L (ref 3.5–5.1)
SODIUM: 144 mmol/L (ref 135–145)
Total Bilirubin: 0.5 mg/dL (ref 0.3–1.2)
Total Protein: 6.3 g/dL — ABNORMAL LOW (ref 6.5–8.1)

## 2018-05-28 LAB — CBC WITH DIFFERENTIAL/PLATELET
BASOS ABS: 0.1 10*3/uL (ref 0–0.1)
BASOS PCT: 1 %
EOS ABS: 0.1 10*3/uL (ref 0–0.7)
Eosinophils Relative: 2 %
HCT: 34.4 % — ABNORMAL LOW (ref 35.0–47.0)
Hemoglobin: 11.5 g/dL — ABNORMAL LOW (ref 12.0–16.0)
LYMPHS ABS: 1.1 10*3/uL (ref 1.0–3.6)
Lymphocytes Relative: 20 %
MCH: 31.4 pg (ref 26.0–34.0)
MCHC: 33.5 g/dL (ref 32.0–36.0)
MCV: 93.7 fL (ref 80.0–100.0)
Monocytes Absolute: 0.5 10*3/uL (ref 0.2–0.9)
Monocytes Relative: 10 %
NEUTROS PCT: 67 %
Neutro Abs: 3.5 10*3/uL (ref 1.4–6.5)
PLATELETS: 249 10*3/uL (ref 150–440)
RBC: 3.67 MIL/uL — AB (ref 3.80–5.20)
RDW: 16.7 % — ABNORMAL HIGH (ref 11.5–14.5)
WBC: 5.3 10*3/uL (ref 3.6–11.0)

## 2018-05-28 MED ORDER — SODIUM CHLORIDE 0.9 % IV SOLN
20.0000 mg | Freq: Once | INTRAVENOUS | Status: AC
  Administered 2018-05-28: 20 mg via INTRAVENOUS
  Filled 2018-05-28: qty 2

## 2018-05-28 MED ORDER — SODIUM CHLORIDE 0.9 % IV SOLN
150.0000 mg/m2 | Freq: Once | INTRAVENOUS | Status: AC
  Administered 2018-05-28: 294 mg via INTRAVENOUS
  Filled 2018-05-28: qty 49

## 2018-05-28 MED ORDER — DIPHENHYDRAMINE HCL 50 MG/ML IJ SOLN
50.0000 mg | Freq: Once | INTRAMUSCULAR | Status: AC
  Administered 2018-05-28: 50 mg via INTRAVENOUS
  Filled 2018-05-28: qty 1

## 2018-05-28 MED ORDER — HEPARIN SOD (PORK) LOCK FLUSH 100 UNIT/ML IV SOLN
INTRAVENOUS | Status: AC
  Filled 2018-05-28: qty 5

## 2018-05-28 MED ORDER — SODIUM CHLORIDE 0.9 % IV SOLN
Freq: Once | INTRAVENOUS | Status: AC
  Administered 2018-05-28: 11:00:00 via INTRAVENOUS
  Filled 2018-05-28: qty 250

## 2018-05-28 MED ORDER — SODIUM CHLORIDE 0.9 % IV SOLN
430.5000 mg | Freq: Once | INTRAVENOUS | Status: AC
  Administered 2018-05-28: 430 mg via INTRAVENOUS
  Filled 2018-05-28: qty 43

## 2018-05-28 MED ORDER — HEPARIN SOD (PORK) LOCK FLUSH 100 UNIT/ML IV SOLN
500.0000 [IU] | Freq: Once | INTRAVENOUS | Status: AC
  Administered 2018-05-28: 500 [IU] via INTRAVENOUS

## 2018-05-28 MED ORDER — FAMOTIDINE IN NACL 20-0.9 MG/50ML-% IV SOLN
20.0000 mg | Freq: Once | INTRAVENOUS | Status: AC
  Administered 2018-05-28: 20 mg via INTRAVENOUS
  Filled 2018-05-28: qty 50

## 2018-05-28 MED ORDER — PEGFILGRASTIM 6 MG/0.6ML ~~LOC~~ PSKT
6.0000 mg | PREFILLED_SYRINGE | Freq: Once | SUBCUTANEOUS | Status: AC
  Administered 2018-05-28: 6 mg via SUBCUTANEOUS
  Filled 2018-05-28: qty 0.6

## 2018-05-28 MED ORDER — PALONOSETRON HCL INJECTION 0.25 MG/5ML
0.2500 mg | Freq: Once | INTRAVENOUS | Status: AC
  Administered 2018-05-28: 0.25 mg via INTRAVENOUS
  Filled 2018-05-28: qty 5

## 2018-05-28 NOTE — Progress Notes (Signed)
No new changes noted today 

## 2018-05-28 NOTE — Progress Notes (Signed)
Hematology/Oncology Consult note [facility]  [phone] [phone]  Patient Care Team: McLean-Scocuzza, Nino Glow, MD as PCP - General (Internal Medicine)   Name of the patient: Melissa Anderson  [id number]  1941-04-04   Date of visit: 05/28/18  Diagnosis- Stage II ovarian cancer  Chief complaint/ Reason for visit-on treatment assessment prior to cycle 3 of adjuvant carboplatin and Taxol chemotherapy  Heme/Onc history: patient is a 77 year old female with a past medical history significant for hyperlipidemia and Graves' disease. She was recently complaining of back pain which led to an MRI of the lumbosacral spine. MRI in March 2019 which incidentally showed a multicystic 4 x 5.5 cm right adnexal mass with blood fluid levels. Prominent uterine endometrial stripe.  This was followed by a pelvic ultrasound in April 2019 which showed uterus measuring 9.7 x 4.1 x 5.9 cm. Multiloculated complex cystic structure in the right adnexa measuring 6.3 x 6.1 x 4.7 cm. It has solid-appearing components as well as anechoic portions separated by thick septations. No normal ovarian parenchyma is identified. No pelvic free fluid. Left ovary was not seen.  This was not further evaluated until patient's met with her gynecologist and was then referred to gynecology oncology Dr. Carlton Adam at [facility]. 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 Theora Gianotti on 02/12/2018.  She had a CT scan of her abdomen and pelvis on 02/09/2018 which showed multiseptated complex cystic lesion in the right pelvis. At adnexa immediately superior to the uterus measuring 6.9 x 5.3 cm. Abnormal thickening of the endometrial cavity measuring 1.3 cm. 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 Tumor is positive for PAX 8, WT 1 and p53. 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 3 lymph nodes negative for malignancy on the right and the left side. Omental biopsy was negative. Peritoneal biopsy was negative for malignancy. pT2a pN0  Baseline tumor markers including CA125 normal at 18  Cycle 1 of chemotherapy was given on 03/21/2018   Interval history-patient reports tolerating chemotherapy well except for symptoms of peripheral neuropathy.  She reports tingling numbness and discomfort in her bilateral feet which is slowly creeping up to her mid legs.  She did not have any of these symptoms prior to chemotherapy.  She does have some chronic right lower extremity pain for which she is also going to be evaluated by neurology next week.  For her neuropathy she had not taken Cymbalta.  She does report feeling fatigued after chemotherapy and tends to rest a lot for the first few days after treatment.    She also reports chronic symptoms of bladder pressure and incontinence for which she was prescribed Vesicare tablet by GYN in [place].  She states that this tablet is not helping and she would like to be evaluated by urology for this   Interval history-reports that her neuropathy symptoms in her feet are stable.  She does have some tingling numbness in her bilateral feet.  Previously it was extending up to her knees but that has subsided.  Denies any nausea  vomiting.  She does report some fatigue but continues to be independent with her ADLs.  She will be starting acupuncture tomorrow for her neuropathy as well.  She is also been using frozen stockings during her  chemotherapy  ECOG PS- 1 Pain scale- 0 Opioid associated constipation- no  Review of systems- Review of Systems  Cardiovascular: Positive for leg swelling.  Musculoskeletal:       Right lower extremity pain  Neurological: Positive for tingling.      Allergies  Allergen Reactions  . Epinephrine Other (See Comments)    Funny feeling, heart racing   . Lipitor [Atorvastatin Calcium]     Myalgias       Past Medical History:  Diagnosis Date  . Cancer ([place]) 2019   b/l ovaries and left fallopian tube pelvic lymph nodes negative   . Hyperlipidemia   . Overactive bladder   . Thyroid disease   . UTI (urinary tract infection)   . Vitamin D deficiency      Past Surgical History:  Procedure Laterality Date  . ABDOMINAL HYSTERECTOMY     02/15/18   . CHOLECYSTECTOMY  2008  . LAPAROSCOPIC VAGINAL HYSTERECTOMY WITH SALPINGO OOPHORECTOMY  02/15/2018  . PORTACATH PLACEMENT Left 02/25/2018   Procedure: INSERTION PORT-A-CATH;  Surgeon: Robert Bellow, MD;  Location: [facility] ORS;  Service: General;  Laterality: Left;  . TOTAL ABDOMINAL HYSTERECTOMY W/ BILATERAL SALPINGOOPHORECTOMY  02/15/2018   lymph nodes removed    Social History   Socioeconomic History  . Marital status: Widowed    Spouse name: Not on file  . Number of children: Not on file  . Years of education: Not on file  . Highest education level: Not on file  Occupational History  . Not on file  Social Needs  . Financial resource strain: Not on file  . Food insecurity:    Worry: Not on file    Inability: Not on file  . Transportation needs:    Medical: Not on file    Non-medical: Not on file  Tobacco Use  . Smoking status: Former Smoker    Packs/day: 1.00    Years: 25.00    Pack years: 25.00    Types: Cigarettes    Last attempt to quit: 02/26/1984    Years since quitting: 34.2  . Smokeless tobacco: Never Used  . Tobacco comment: age 35 to 15 1 ppd   Substance and Sexual Activity  . Alcohol use: Never     Frequency: Never  . Drug use: Never  . Sexual activity: Not Currently  Lifestyle  . Physical activity:    Days per week: Not on file    Minutes per session: Not on file  . Stress: Not on file  Relationships  . Social connections:    Talks on phone: Not on file    Gets together: Not on file    Attends religious service: Not on file    Active member of club or organization: Not on file    Attends meetings of clubs or organizations: Not on file    Relationship status: Not on file  . Intimate partner violence:    Fear of current or ex partner: Not on file    Emotionally abused: Not on file    Physically abused: Not on file    Forced sexual activity: Not on file  Other Topics Concern  . Not on file  Social History Stage manager, bachelors degree    Lives in [place] here  visiting daughter [name]    Former smoker quit age 63 y.o     Family History  Problem Relation Age of Onset  . Sudden death Mother 4       drowning in hurricane Katrina  . Pancreatic cancer Father        deceased early 14s; smoker  . Alcohol abuse Father   . COPD Father   . Coronary artery disease Brother   . Melanoma Sister        deceased at 18  . COPD Sister   . Prostate cancer Maternal Uncle        deceased 53  . Breast cancer Paternal Aunt        unsure of this dx     Current Outpatient Medications:  .  DULoxetine (CYMBALTA) 30 MG capsule, Take 1 capsule (30 mg total) by mouth daily. For 7 days and then 2 capsule daily for rest of month, Disp: 53 capsule, Rfl: 3 .  furosemide (LASIX) 20 MG tablet, Take 1 tablet (20 mg total) by mouth daily. In am, Disp: 30 tablet, Rfl: 2 .  gabapentin (NEURONTIN) 100 MG capsule, Take 100 mg twice a day for one week, then increase to 200 mg(2 tablets) twice a day and continue, Disp: , Rfl:  .  HYDROcodone-acetaminophen (NORCO/VICODIN) 5-325 MG tablet, Take 1 tablet by mouth every 4 (four) hours as needed for moderate pain., Disp: 45 tablet, Rfl: 0 .  OVER  THE COUNTER MEDICATION, Obagi facial cleansing otc system - use as directed, Disp: , Rfl:  .  rosuvastatin (CRESTOR) 10 MG tablet, TAKE 1 TABLET BY MOUTH EVERY DAY IN THE EVENING, Disp: 30 tablet, Rfl: 1 .  SYNTHROID 175 MCG tablet, TAKE 1 TABLET BY MOUTH EVERY DAY, Disp: 30 tablet, Rfl: 0 .  VESICARE 5 MG tablet, Take 5 mg by mouth every evening. , Disp: , Rfl:  .  Vitamin D, Ergocalciferol, (DRISDOL) 50000 units CAPS capsule, TAKE 1 CAPSULE (50,000 UNITS TOTAL) BY MOUTH EVERY WEDNESDAY., Disp: 4 capsule, Rfl: 0 .  acetaminophen (TYLENOL) 500 MG tablet, Take 500 mg by mouth every 6 (six) hours as needed for moderate pain., Disp: , Rfl:  .  dexamethasone (DECADRON) 4 MG tablet, Take 2 tablets (8 mg total) by mouth daily. Start the day after chemotherapy for 2 days. (Patient not taking: Reported on 05/28/2018), Disp: 30 tablet, Rfl: 1 .  lidocaine-prilocaine (EMLA) cream, Apply to affected area once (Patient not taking: Reported on 05/28/2018), Disp: 30 g, Rfl: 3 .  LORazepam (ATIVAN) 0.5 MG tablet, Take 1 tablet (0.5 mg total) by mouth every 6 (six) hours as needed (Nausea or vomiting). (Patient not taking: Reported on 05/28/2018), Disp: 30 tablet, Rfl: 0 .  ondansetron (ZOFRAN) 8 MG tablet, Take 1 tablet (8 mg total) by mouth 2 (two) times daily as needed for refractory nausea / vomiting. Start on day 3 after chemo. (Patient not taking: Reported on 05/28/2018), Disp: 30 tablet, Rfl: 1 .  prochlorperazine (COMPAZINE) 10 MG tablet, Take 1 tablet (10 mg total) by mouth every 6 (six) hours as needed (Nausea or vomiting). (Patient not taking: Reported on 05/28/2018), Disp: 30 tablet, Rfl: 1 No current facility-administered medications for this visit.   Facility-Administered Medications Ordered in Other Visits:  .  CARBOplatin (PARAPLATIN) 430 mg in sodium chloride 0.9 % 250 mL chemo infusion, 430 mg, Intravenous, Once, Sindy Guadeloupe, MD .  PACLitaxel (TAXOL) 294 mg in sodium chloride 0.9 % 250 mL chemo  infusion (> 22m/m2),  150 mg/m2 (Treatment Plan Recorded), Intravenous, Once, Sindy Guadeloupe, MD, Last Rate: 100 mL/hr at 05/28/18 1209, 294 mg at 05/28/18 1209 .  pegfilgrastim (NEULASTA ONPRO KIT) injection 6 mg, 6 mg, Subcutaneous, Once, Sindy Guadeloupe, MD  Physical exam:  Vitals:   05/28/18 0942  BP: 118/73  Pulse: 86  Resp: 18  Temp: 97.7 F (36.5 C)  TempSrc: Tympanic  SpO2: 96%  Weight: 184 lb 9.6 oz (83.7 kg)  Height: '5\' 6"'  (1.676 m)   Physical Exam  Constitutional: She is oriented to person, place, and time. She appears well-developed and well-nourished.  HENT:  Head: Normocephalic and atraumatic.  Eyes: Pupils are equal, round, and reactive to light. EOM are normal.  Neck: Normal range of motion.  Cardiovascular: Normal rate, regular rhythm and normal heart sounds.  Pulmonary/Chest: Effort normal and breath sounds normal.  Abdominal: Soft. Bowel sounds are normal.  Musculoskeletal: She exhibits edema (Bilateral ankle edema).  Neurological: She is alert and oriented to person, place, and time.  Skin: Skin is warm and dry.     CMP Latest Ref Rng & Units 05/28/2018  Glucose 70 - 99 mg/dL 111(H)  BUN 8 - 23 mg/dL 20  Creatinine 0.44 - 1.00 mg/dL 0.58  Sodium 135 - 145 mmol/L 144  Potassium 3.5 - 5.1 mmol/L 3.6  Chloride 98 - 111 mmol/L 108  CO2 22 - 32 mmol/L 29  Calcium 8.9 - 10.3 mg/dL 9.1  Total Protein 6.5 - 8.1 g/dL 6.3(L)  Total Bilirubin 0.3 - 1.2 mg/dL 0.5  Alkaline Phos 38 - 126 U/L 73  AST 15 - 41 U/L 22  ALT 0 - 44 U/L 16   CBC Latest Ref Rng & Units 05/28/2018  WBC 3.6 - 11.0 K/uL 5.3  Hemoglobin 12.0 - 16.0 g/dL 11.5(L)  Hematocrit 35.0 - 47.0 % 34.4(L)  Platelets 150 - 440 K/uL 249    No images are attached to the encounter.  US Venous Img Lower Bilateral  Result Date: 05/10/2018 CLINICAL DATA:  Bilateral lower extremity edema. Elevated D-dimer. History of ovarian cancer. Evaluate for DVT. EXAM: BILATERAL LOWER EXTREMITY VENOUS DOPPLER  ULTRASOUND TECHNIQUE: Gray-scale sonography with graded compression, as well as color Doppler and duplex ultrasound were performed to evaluate the lower extremity deep venous systems from the level of the common femoral vein and including the common femoral, femoral, profunda femoral, popliteal and calf veins including the posterior tibial, peroneal and gastrocnemius veins when visible. The superficial great saphenous vein was also interrogated. Spectral Doppler was utilized to evaluate flow at rest and with distal augmentation maneuvers in the common femoral, femoral and popliteal veins. COMPARISON:  None. FINDINGS: RIGHT LOWER EXTREMITY Common Femoral Vein: No evidence of thrombus. Normal compressibility, respiratory phasicity and response to augmentation. Saphenofemoral Junction: No evidence of thrombus. Normal compressibility and flow on color Doppler imaging. Profunda Femoral Vein: No evidence of thrombus. Normal compressibility and flow on color Doppler imaging. Femoral Vein: No evidence of thrombus. Normal compressibility, respiratory phasicity and response to augmentation. Popliteal Vein: No evidence of thrombus. Normal compressibility, respiratory phasicity and response to augmentation. Calf Veins: No evidence of thrombus. Normal compressibility and flow on color Doppler imaging. Superficial Great Saphenous Vein: No evidence of thrombus. Normal compressibility. Venous Reflux:  None. Other Findings: Note is made of an approximately 3.9 x 1.4 x 3.8 cm anechoic fluid collection within the right popliteal fossa compatible with a Baker cyst. LEFT LOWER EXTREMITY Common Femoral Vein: No evidence of thrombus. Normal compressibility, respiratory phasicity and  response to augmentation. Saphenofemoral Junction: No evidence of thrombus. Normal compressibility and flow on color Doppler imaging. Profunda Femoral Vein: No evidence of thrombus. Normal compressibility and flow on color Doppler imaging. Femoral Vein: No  evidence of thrombus. Normal compressibility, respiratory phasicity and response to augmentation. Popliteal Vein: No evidence of thrombus. Normal compressibility, respiratory phasicity and response to augmentation. Calf Veins: No evidence of thrombus. Normal compressibility and flow on color Doppler imaging. Superficial Great Saphenous Vein: No evidence of thrombus. Normal compressibility. Venous Reflux:  None. Other Findings: Note is made an approximately 6.2 x 1.1 x 3.4 cm anechoic fluid collection with the left popliteal fossa compatible with a Baker's cyst. IMPRESSION: 1. No evidence of DVT within either lower extremity. 2. Incidentally noted small bilateral anechoic Baker cysts, the left measuring 6.2 cm and the right measuring 3.9 cm. Electronically Signed   By: Sandi Mariscal M.D.   On: 05/10/2018 12:23     Assessment and plan- Patient is a 77 y.o. female with stage II ovarian cancer status post surgery.  She is here for on treatment assessment prior to cycle 3 of adjuvant carboplatin and Taxol chemotherapy  Counts are okay to proceed with cycle 3 of carboplatin and Taxol chemotherapy today with on pro-Neulasta support.  Her Taxol has been dose reduced to 150 mg/m with cycle 2 due to symptoms of neuropathy and we will continue with the same dose.  Chemo-induced peripheral neuropathy: She was evaluated by neurology and her Cymbalta was stopped and she was switched to Neurontin.  She would also be starting acupuncture treatments tomorrow.  She can continue to use frozen stockings during chemotherapy and for a few days following it.  I will see her back in 3 weeks time with CBC and CMP prior to cycle 5 of chemotherapy.  Plan is to complete 6 cycles followed by repeat imaging   Visit Diagnosis 1. Malignant neoplasm of right ovary ([place])   2. Encounter for antineoplastic chemotherapy   3. Chemotherapy-induced peripheral neuropathy ([place])      Dr. Randa Evens, MD, MPH [facility] at [facility] [phone] 05/28/2018 12:11 PM

## 2018-05-29 ENCOUNTER — Other Ambulatory Visit: Payer: Self-pay | Admitting: Oncology

## 2018-05-31 ENCOUNTER — Other Ambulatory Visit: Payer: Self-pay | Admitting: Internal Medicine

## 2018-05-31 ENCOUNTER — Telehealth: Payer: Self-pay

## 2018-05-31 ENCOUNTER — Ambulatory Visit (INDEPENDENT_AMBULATORY_CARE_PROVIDER_SITE_OTHER): Payer: Medicare Other

## 2018-05-31 VITALS — BP 118/64 | HR 87 | Temp 98.2°F | Resp 16 | Ht 66.0 in | Wt 189.0 lb

## 2018-05-31 DIAGNOSIS — Z23 Encounter for immunization: Secondary | ICD-10-CM | POA: Diagnosis not present

## 2018-05-31 DIAGNOSIS — E039 Hypothyroidism, unspecified: Secondary | ICD-10-CM

## 2018-05-31 DIAGNOSIS — E785 Hyperlipidemia, unspecified: Secondary | ICD-10-CM

## 2018-05-31 DIAGNOSIS — Z0000 Encounter for general adult medical examination without abnormal findings: Secondary | ICD-10-CM

## 2018-05-31 MED ORDER — ROSUVASTATIN CALCIUM 10 MG PO TABS: ORAL_TABLET | ORAL | 3 refills | Status: DC

## 2018-05-31 MED ORDER — SYNTHROID 175 MCG PO TABS: 175.0000 ug | ORAL_TABLET | Freq: Every day | ORAL | 3 refills | Status: DC

## 2018-05-31 NOTE — Progress Notes (Signed)
Subjective:   Melissa Anderson is a 77 y.o. female who presents for an Initial Medicare Annual Wellness Visit.  Review of Systems    No ROS.  Medicare Wellness Visit. Additional risk factors are reflected in the social history.  Cardiac Risk Factors include: advanced age (>35men, >40 women)     Objective:    Today's Vitals   05/31/18 0839 05/31/18 0840  BP: 118/64   Pulse: 87   Resp: 16   Temp: 98.2 F (36.8 C)   TempSrc: Oral   SpO2: 95%   Weight: 189 lb (85.7 kg)   Height: 5\' 6"  (1.676 m)   PainSc:  0-No pain   Body mass index is 30.51 kg/m.  Advanced Directives 05/31/2018 05/28/2018 05/07/2018 04/23/2018 04/16/2018 04/03/2018 04/02/2018  Does Patient Have a Medical Advance Directive? Yes No No No No No No  Type of Advance Directive Living will;Healthcare Power of [place];Living will - - [place];Living will -  Does patient want to make changes to medical advance directive? No - Patient declined No - Patient declined No - Patient declined No - Patient declined No - Patient declined No - Patient declined No - Patient declined  Copy of [place] in Chart? No - copy requested No - copy requested No - copy requested No - copy requested No - copy requested No - copy requested No - copy requested  Would patient like information on creating a medical advance directive? - No - Patient declined No - Patient declined No - Patient declined No - Patient declined - No - Patient declined    Current Medications (verified) Outpatient Encounter Medications as of 05/31/2018  Medication Sig  . acetaminophen (TYLENOL) 500 MG tablet Take 500 mg by mouth every 6 (six) hours as needed for moderate pain.  Marland Kitchen dexamethasone (DECADRON) 4 MG tablet Take 2 tablets (8 mg total) by mouth daily. Start the day after chemotherapy for 2 days.  . DULoxetine (CYMBALTA) 30 MG capsule Take 1 capsule (30 mg total) by mouth daily. For 7 days and then  2 capsule daily for rest of month  . furosemide (LASIX) 20 MG tablet Take 1 tablet (20 mg total) by mouth daily. In am  . gabapentin (NEURONTIN) 100 MG capsule Take 100 mg twice a day for one week, then increase to 200 mg(2 tablets) twice a day and continue  . HYDROcodone-acetaminophen (NORCO/VICODIN) 5-325 MG tablet Take 1 tablet by mouth every 4 (four) hours as needed for moderate pain.  Marland Kitchen lidocaine-prilocaine (EMLA) cream Apply to affected area once  . LORazepam (ATIVAN) 0.5 MG tablet Take 1 tablet (0.5 mg total) by mouth every 6 (six) hours as needed (Nausea or vomiting).  . ondansetron (ZOFRAN) 8 MG tablet Take 1 tablet (8 mg total) by mouth 2 (two) times daily as needed for refractory nausea / vomiting. Start on day 3 after chemo.  Marland Kitchen OVER THE COUNTER MEDICATION Obagi facial cleansing otc system - use as directed  . prochlorperazine (COMPAZINE) 10 MG tablet Take 1 tablet (10 mg total) by mouth every 6 (six) hours as needed (Nausea or vomiting).  . VESICARE 5 MG tablet Take 5 mg by mouth every evening.   . Vitamin D, Ergocalciferol, (DRISDOL) 50000 units CAPS capsule TAKE 1 CAPSULE (50,000 UNITS TOTAL) BY MOUTH EVERY WEDNESDAY.  Marland Kitchen Vitamin D, Ergocalciferol, (DRISDOL) 50000 units CAPS capsule TAKE 1 CAPSULE (50,000 UNITS TOTAL) BY MOUTH EVERY WEDNESDAY.  . [DISCONTINUED] rosuvastatin (CRESTOR)  10 MG tablet TAKE 1 TABLET BY MOUTH EVERY DAY IN THE EVENING  . [DISCONTINUED] SYNTHROID 175 MCG tablet TAKE 1 TABLET BY MOUTH EVERY DAY   No facility-administered encounter medications on file as of 05/31/2018.     Allergies (verified) Epinephrine and Lipitor [atorvastatin calcium]   History: Past Medical History:  Diagnosis Date  . 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 Hyperlipidemia   . Overactive bladder   . Thyroid disease   . UTI (urinary tract infection)   . Vitamin D deficiency    Past Surgical History:  Procedure Laterality Date  .  ABDOMINAL HYSTERECTOMY     02/15/18   . CHOLECYSTECTOMY  2008  . LAPAROSCOPIC VAGINAL HYSTERECTOMY WITH SALPINGO OOPHORECTOMY  02/15/2018  . PORTACATH PLACEMENT Left 02/25/2018   Procedure: INSERTION PORT-A-CATH;  Surgeon: Robert Bellow, MD;  Location: [facility] ORS;  Service: General;  Laterality: Left;  . TOTAL ABDOMINAL HYSTERECTOMY W/ BILATERAL SALPINGOOPHORECTOMY  02/15/2018   lymph nodes removed   Family History  Problem Relation Age of Onset  . Sudden death Mother 77       drowning in hurricane Katrina  . Pancreatic cancer Father        deceased early 49s; smoker  . Alcohol abuse Father   . COPD Father   . Coronary artery disease Brother   . Melanoma Sister        deceased at 17  . COPD Sister   . Prostate cancer Maternal Uncle        deceased 17  . Breast cancer Paternal Aunt        unsure of this dx   Social History   Socioeconomic History  . Marital status: Widowed    Spouse name: Not on file  . Number of children: Not on file  . Years of education: Not on file  . Highest education level: Not on file  Occupational History  . Not on file  Social Needs  . Financial resource strain: Not on file  . Food insecurity:    Worry: Not on file    Inability: Not on file  . Transportation needs:    Medical: Not on file    Non-medical: Not on file  Tobacco Use  . Smoking status: Former Smoker    Packs/day: 1.00    Years: 25.00    Pack years: 25.00    Types: Cigarettes    Last attempt to quit: 02/26/1984    Years since quitting: 34.2  . Smokeless tobacco: Never Used  . Tobacco comment: age 23 to 43 1 ppd   Substance and Sexual Activity  . Alcohol use: Never    Frequency: Never  . Drug use: Never  . Sexual activity: Not Currently  Lifestyle  . Physical activity:    Days per week: Not on file    Minutes per session: Not on file  . Stress: Not on file  Relationships  . Social connections:    Talks on phone: Not on file    Gets together: Not on file    Attends  religious service: Not on file    Active member of club or organization: Not on file    Attends meetings of clubs or organizations: Not on file    Relationship status: Not on file  Other Topics Concern  . Not on file  Social History Stage manager, bachelors degree    Lives in [place] here  visiting daughter [name]    Former smoker quit age 63 y.o     Tobacco Counseling Counseling given: Not Answered Comment: age 67 to 23 1 ppd    Clinical Intake:  Pre-visit preparation completed: Yes  Pain Score: 0-No pain     Nutritional Status: BMI > 30  Obese Diabetes: No  How often do you need to have someone help you when you read instructions, pamphlets, or other written materials from your doctor or pharmacy?: 1 - Never  Interpreter Needed?: No      Activities of Daily Living In your present state of health, do you have any difficulty performing the following activities: 05/31/2018  Hearing? N  Vision? N  Difficulty concentrating or making decisions? N  Walking or climbing stairs? Y  Comment R leg bone pain when walking long distances, followed by oncologist  Dressing or bathing? N  Doing errands, shopping? N  Preparing Food and eating ? N  Using the Toilet? N  In the past six months, have you accidently leaked urine? Y  Comment Managed with daily brief, followed by [place] you have problems with loss of bowel control? N  Managing your Medications? N  Managing your Finances? N  Housekeeping or managing your Housekeeping? N     Immunizations and Health Maintenance Immunization History  Administered Date(s) Administered  . Influenza, High Dose Seasonal PF 05/31/2018   Health Maintenance Due  Topic Date Due  . TETANUS/TDAP  05/31/1959  . DEXA SCAN  05/30/2005  . PNA vac Low Risk Adult (1 of 2 - PCV13) 05/30/2005    Patient Care Team: McLean-Scocuzza, Nino Glow, MD as PCP - General (Internal Medicine)  Indicate any recent Medical Services  you may have received from other than [facility] providers in the past year (date may be approximate).     Assessment:   This is a routine wellness examination for [place].  The goal of the wellness visit is to assist the patient how to close the gaps in care and create a preventative care plan for the patient.   The roster of all physicians providing medical care to patient is listed in the Snapshot section of the chart.  Taking calcium VIT D as appropriate/Osteoporosis risk reviewed.    Safety issues reviewed; Smoke and carbon monoxide detectors in the home. No firearms in the home. Wears seatbelts when driving or riding with others. No violence in the home.  They do not have excessive sun exposure.  Discussed the need for sun protection: hats, long sleeves and the use of sunscreen if there is significant sun exposure.  No new identified risk were noted.  No failures at ADL's or IADL's.   BMI- discussed the importance of a healthy diet, water intake and the benefits of aerobic exercise. She plans to begin a 2000 calorie diet with healthy choices and increase her physical activity. Educational material provided.   Dental- every 6 months.  Sleep patterns- Sleeps without issues.   High dose influenza vaccine administered L deltoid, tolerated well. No verbal complaint during or after administration. Educational material provided.  Health maintenance gaps discussed.  Patient Concerns: Refill synthroid and crestor; complete.  Cologuard reordered per CMA.  Hearing/Vision screen Hearing Screening Comments: Patient is able to hear conversational tones without difficulty.  No issues reported.   Vision Screening Comments: Followed by [facility] Wears corrective lenses Last OV 11/2017 Visual acuity not assessed per patient preference since they have regular follow up with  the ophthalmologist  Dietary issues and exercise activities discussed: Current Exercise Habits: The  patient does not participate in regular exercise at present  Goals      Patient Stated   . Lose weight (pt-stated)     Stay active, walk for exercise as tolerated. 1200 calorie diet Stay hydrated      Depression Screen PHQ 2/9 Scores 05/31/2018  PHQ - 2 Score 0    Fall Risk Fall Risk  05/31/2018  Falls in the past year? No    Cognitive Function:     6CIT Screen 05/31/2018  What Year? 0 points  What month? 0 points  What time? 0 points  Count back from 20 0 points  Months in reverse 0 points  Repeat phrase 0 points  Total Score 0    Screening Tests Health Maintenance  Topic Date Due  . TETANUS/TDAP  05/31/1959  . DEXA SCAN  05/30/2005  . PNA vac Low Risk Adult (1 of 2 - PCV13) 05/30/2005  . INFLUENZA VACCINE  Completed     Plan:    End of life planning; Advance aging; Advanced directives discussed. Copy of current HCPOA/Living Will requested.    I have personally reviewed and noted the following in the patient's chart:   . Medical and social history . Use of alcohol, tobacco or illicit drugs  . Current medications and supplements . Functional ability and status . Nutritional status . Physical activity . Advanced directives . List of other physicians . Hospitalizations, surgeries, and ER visits in previous 12 months . Vitals . Screenings to include cognitive, depression, and falls . Referrals and appointments  In addition, I have reviewed and discussed with patient certain preventive protocols, quality metrics, and best practice recommendations. A written personalized care plan for preventive services as well as general preventive health recommendations were provided to patient.     Varney Biles, LPN   16/10/8464

## 2018-05-31 NOTE — Progress Notes (Signed)
Agree with below   Dr. TMS  

## 2018-05-31 NOTE — Patient Instructions (Addendum)
  Ms. Vaughan , Thank you for taking time to come for your Medicare Wellness Visit. I appreciate your ongoing commitment to your health goals. Please review the following plan we discussed and let me know if I can assist you in the future.   Follow up with as needed.    Bring a copy of your [place] and/or Living Will to be scanned into chart.  Have a great day!  These are the goals we discussed: Goals      Patient Stated   . Lose weight (pt-stated)     Stay active, walk for exercise as tolerated. 1200 calorie diet Stay hydrated       This is a list of the screening recommended for you and due dates:  Health Maintenance  Topic Date Due  . Tetanus Vaccine  05/31/1959  . DEXA scan (bone density measurement)  05/30/2005  . Pneumonia vaccines (1 of 2 - PCV13) 05/30/2005  . Flu Shot  Completed

## 2018-05-31 NOTE — Telephone Encounter (Signed)
Called and left message following up with patient to see how she was feeling after receiving influenza vaccine today. Encouraged to call her doctor if any symptoms or side effects present.  Influenza vaccine was given as recommended after round 3 of chemotherapy; noted she was on round 4 out of 6. Verbalized no complaint after or during administration.

## 2018-06-03 ENCOUNTER — Other Ambulatory Visit: Payer: Self-pay

## 2018-06-03 ENCOUNTER — Ambulatory Visit (INDEPENDENT_AMBULATORY_CARE_PROVIDER_SITE_OTHER): Payer: Medicare Other | Admitting: Urology

## 2018-06-03 ENCOUNTER — Encounter: Payer: Self-pay | Admitting: Urology

## 2018-06-03 VITALS — BP 110/69 | HR 111 | Ht 66.0 in | Wt 184.0 lb

## 2018-06-03 DIAGNOSIS — R32 Unspecified urinary incontinence: Secondary | ICD-10-CM | POA: Diagnosis not present

## 2018-06-03 MED ORDER — MIRABEGRON ER 50 MG PO TB24: 50.0000 mg | ORAL_TABLET | Freq: Every day | ORAL | 11 refills | Status: DC

## 2018-06-03 NOTE — Progress Notes (Signed)
06/03/2018 3:31 PM   Melissa Anderson April 19, 1941 [id number]  Referring provider: McLean-Scocuzza, Nino Glow, MD [place], [place] [address]  Chief Complaint  Patient presents with  . Establish Care    HPI: Consulted to assess the patient's urinary incontinence worsening over many months.  She is living here locally with her daughter who is an oncologist to have chemotherapy for stage II ovarian cancer.  She lives in [place].  She describes mild urge incontinence during the day and wears 1 pad.  She describes bedwetting and foot on the floor syndrome.  She is currently on Vesicare 5 mg as the only medication tried for her overactive bladder.  She denies stress incontinence.  She can use 3 pads at night for higher volume leakage  She denies a history of bladder infections and previous GU surgery.  She has no neurologic issues  Modifying factors: There are no other modifying factors  Associated signs and symptoms: There are no other associated signs and symptoms Aggravating and relieving factors: There are no other aggravating or relieving factors Severity: Moderate Duration: Persistent   PMH: Past Medical History:  Diagnosis Date  . 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 Hyperlipidemia   . Overactive bladder   . Thyroid disease   . UTI (urinary tract infection)   . Vitamin D deficiency     Surgical History: Past Surgical History:  Procedure Laterality Date  . ABDOMINAL HYSTERECTOMY     02/15/18   . CHOLECYSTECTOMY  2008  . LAPAROSCOPIC VAGINAL HYSTERECTOMY WITH SALPINGO OOPHORECTOMY  02/15/2018  . PORTACATH PLACEMENT Left 02/25/2018   Procedure: INSERTION PORT-A-CATH;  Surgeon: Robert Bellow, MD;  Location: [facility] ORS;  Service: General;  Laterality: Left;  . TOTAL ABDOMINAL HYSTERECTOMY W/ BILATERAL SALPINGOOPHORECTOMY  02/15/2018   lymph nodes removed    Home Medications:  Allergies as of  06/03/2018      Reactions   Epinephrine Other (See Comments)   Funny feeling, heart racing   Lipitor [atorvastatin Calcium]    Myalgias       Medication List        Accurate as of 06/03/18  3:31 PM. Always use your most recent med list.          acetaminophen 500 MG tablet Commonly known as:  TYLENOL Take 500 mg by mouth every 6 (six) hours as needed for moderate pain.   dexamethasone 4 MG tablet Commonly known as:  DECADRON Take 2 tablets (8 mg total) by mouth daily. Start the day after chemotherapy for 2 days.   DULoxetine 30 MG capsule Commonly known as:  CYMBALTA Take 1 capsule (30 mg total) by mouth daily. For 7 days and then 2 capsule daily for rest of month   furosemide 20 MG tablet Commonly known as:  LASIX Take 1 tablet (20 mg total) by mouth daily. In am   gabapentin 100 MG capsule Commonly known as:  NEURONTIN Take 100 mg twice a day for one week, then increase to 200 mg(2 tablets) twice a day and continue   HYDROcodone-acetaminophen 5-325 MG tablet Commonly known as:  NORCO/VICODIN Take 1 tablet by mouth every 4 (four) hours as needed for moderate pain.   lidocaine-prilocaine cream Commonly known as:  EMLA Apply to affected area once   LORazepam 0.5 MG tablet Commonly known as:  ATIVAN Take 1 tablet (0.5 mg total) by mouth every 6 (six) hours as needed (Nausea  or vomiting).   ondansetron 8 MG tablet Commonly known as:  ZOFRAN Take 1 tablet (8 mg total) by mouth 2 (two) times daily as needed for refractory nausea / vomiting. Start on day 3 after chemo.   OVER THE COUNTER MEDICATION Obagi facial cleansing otc system - use as directed   prochlorperazine 10 MG tablet Commonly known as:  COMPAZINE Take 1 tablet (10 mg total) by mouth every 6 (six) hours as needed (Nausea or vomiting).   rosuvastatin 10 MG tablet Commonly known as:  CRESTOR TAKE 1 TABLET BY MOUTH EVERY DAY IN THE EVENING   SYNTHROID 175 MCG tablet Generic drug:  levothyroxine Take  1 tablet (175 mcg total) by mouth daily.   VESICARE 5 MG tablet Generic drug:  solifenacin Take 5 mg by mouth every evening.   Vitamin D (Ergocalciferol) 50000 units Caps capsule Commonly known as:  DRISDOL TAKE 1 CAPSULE (50,000 UNITS TOTAL) BY MOUTH EVERY WEDNESDAY.   Vitamin D (Ergocalciferol) 50000 units Caps capsule Commonly known as:  DRISDOL TAKE 1 CAPSULE (50,000 UNITS TOTAL) BY MOUTH EVERY WEDNESDAY.       Allergies:  Allergies  Allergen Reactions  . Epinephrine Other (See Comments)    Funny feeling, heart racing   . Lipitor [Atorvastatin Calcium]     Myalgias      Family History: Family History  Problem Relation Age of Onset  . Sudden death Mother 35       drowning in hurricane Katrina  . Pancreatic cancer Father        deceased early 40s; smoker  . Alcohol abuse Father   . COPD Father   . Coronary artery disease Brother   . Melanoma Sister        deceased at 4  . COPD Sister   . Prostate cancer Maternal Uncle        deceased 31  . Breast cancer Paternal Aunt        unsure of this dx    Social History:  reports that she quit smoking about 34 years ago. Her smoking use included cigarettes. She has a 25.00 pack-year smoking history. She has never used smokeless tobacco. She reports that she does not drink alcohol or use drugs.  ROS:                                        Physical Exam: BP 110/69   Pulse (!) 111   Ht 5\' 6"  (1.676 m)   Wt 184 lb (83.5 kg)   BMI 29.70 kg/m   Constitutional:  Alert and oriented, No acute distress. HEENT: [place] AT, moist mucus membranes.  Trachea midline, no masses. Cardiovascular: No clubbing, cyanosis, or edema. Respiratory: Normal respiratory effort, no increased work of breathing. GI: Abdomen is soft, nontender, nondistended, no abdominal masses GU: Limited examination due to immobility.  She had a small cystourethrocele but no stress incontinence.  She had no true prolapse.  She had  moderate atrophy Skin: No rashes, bruises or suspicious lesions. Lymph: No cervical or inguinal adenopathy. Neurologic: Grossly intact, no focal deficits, moving all 4 extremities. Psychiatric: Normal mood and affect.  Laboratory Data: Lab Results  Component Value Date   WBC 5.3 05/28/2018   HGB 11.5 (L) 05/28/2018   HCT 34.4 (L) 05/28/2018   MCV 93.7 05/28/2018   PLT 249 05/28/2018    Lab Results  Component Value Date  CREATININE 0.58 05/28/2018    No results found for: PSA  No results found for: TESTOSTERONE  Lab Results  Component Value Date   HGBA1C 5.3 05/09/2018    Urinalysis    Component Value Date/Time   COLORURINE YELLOW (A) 04/16/2018 0941   APPEARANCEUR Clear 05/09/2018 0938   LABSPEC 1.020 04/16/2018 0941   PHURINE 5.0 04/16/2018 0941   GLUCOSEU Negative 05/09/2018 0938   HGBUR SMALL (A) 04/16/2018 0941   BILIRUBINUR Negative 05/09/2018 [place] 04/16/2018 0941   PROTEINUR Negative 05/09/2018 0938   PROTEINUR NEGATIVE 04/16/2018 0941   NITRITE Negative 05/09/2018 0938   NITRITE NEGATIVE 04/16/2018 0941   LEUKOCYTESUR Negative 05/09/2018 0938    Pertinent Imaging:   Assessment & Plan: 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  The role of combination therapy with Myrbetriq 50 mg with the Vesicare described.  She did well I would stop the Vesicare.  She thinks the Vesicare 5 mg may be helping some and we spoke about increased dosing.  Reassess in 5 weeks on double therapy and stop Vesicare if patient doing well  There are no diagnoses linked to this encounter.  No follow-ups on file.  Reece Packer, MD  [facility] [address], [place], [place] [address] [phone]

## 2018-06-04 DIAGNOSIS — G620 Drug-induced polyneuropathy: Secondary | ICD-10-CM | POA: Diagnosis not present

## 2018-06-04 DIAGNOSIS — R200 Anesthesia of skin: Secondary | ICD-10-CM | POA: Diagnosis not present

## 2018-06-04 DIAGNOSIS — R202 Paresthesia of skin: Secondary | ICD-10-CM | POA: Diagnosis not present

## 2018-06-04 DIAGNOSIS — T451X5A Adverse effect of antineoplastic and immunosuppressive drugs, initial encounter: Secondary | ICD-10-CM | POA: Diagnosis not present

## 2018-06-06 LAB — CULTURE, URINE COMPREHENSIVE

## 2018-06-11 ENCOUNTER — Telehealth: Payer: Self-pay | Admitting: *Deleted

## 2018-06-11 NOTE — Addendum Note (Signed)
Addended by: Betti Cruz on: 06/11/2018 03:25 PM   Modules accepted: Orders

## 2018-06-11 NOTE — Telephone Encounter (Signed)
Patient called reporting that she is having" balance issues" and that she is falling when walking. She wants to know what to do about it. Please advise

## 2018-06-11 NOTE — Telephone Encounter (Signed)
PLEASE ASK HER TO HOLD HER NEURONTIN

## 2018-06-11 NOTE — Telephone Encounter (Signed)
Call returned to patient I got her voice mail and left message that doctor advised to hold Gabapentin. I asked that she return my call to confirm receipt of message

## 2018-06-11 NOTE — Telephone Encounter (Signed)
Patient states the Neurologist stopped the Gabapentin and started her on something else, but did not know what. She went to get it and states he changed Gabapentin 100 mg to 3 caps twice a day She had concerns about stopping it cold [place]. I checked with B Pearline Cables, NP who states she should be able to stop it without weaning  off. She agree that she will stop it and see if it makes a difference

## 2018-06-12 DIAGNOSIS — Z9911 Dependence on respirator [ventilator] status: Secondary | ICD-10-CM | POA: Diagnosis not present

## 2018-06-12 DIAGNOSIS — A412 Sepsis due to unspecified staphylococcus: Secondary | ICD-10-CM | POA: Diagnosis not present

## 2018-06-12 DIAGNOSIS — R531 Weakness: Secondary | ICD-10-CM | POA: Diagnosis not present

## 2018-06-12 DIAGNOSIS — G629 Polyneuropathy, unspecified: Secondary | ICD-10-CM | POA: Diagnosis not present

## 2018-06-12 DIAGNOSIS — E869 Volume depletion, unspecified: Secondary | ICD-10-CM | POA: Diagnosis present

## 2018-06-12 DIAGNOSIS — E039 Hypothyroidism, unspecified: Secondary | ICD-10-CM | POA: Diagnosis present

## 2018-06-12 DIAGNOSIS — E785 Hyperlipidemia, unspecified: Secondary | ICD-10-CM | POA: Diagnosis present

## 2018-06-12 DIAGNOSIS — T451X5A Adverse effect of antineoplastic and immunosuppressive drugs, initial encounter: Secondary | ICD-10-CM | POA: Diagnosis present

## 2018-06-12 DIAGNOSIS — Z452 Encounter for adjustment and management of vascular access device: Secondary | ICD-10-CM | POA: Diagnosis not present

## 2018-06-12 DIAGNOSIS — J9690 Respiratory failure, unspecified, unspecified whether with hypoxia or hypercapnia: Secondary | ICD-10-CM | POA: Diagnosis not present

## 2018-06-12 DIAGNOSIS — Z87891 Personal history of nicotine dependence: Secondary | ICD-10-CM | POA: Diagnosis not present

## 2018-06-12 DIAGNOSIS — J811 Chronic pulmonary edema: Secondary | ICD-10-CM | POA: Diagnosis not present

## 2018-06-12 DIAGNOSIS — M5114 Intervertebral disc disorders with radiculopathy, thoracic region: Secondary | ICD-10-CM | POA: Diagnosis not present

## 2018-06-12 DIAGNOSIS — Z7401 Bed confinement status: Secondary | ICD-10-CM | POA: Diagnosis not present

## 2018-06-12 DIAGNOSIS — C569 Malignant neoplasm of unspecified ovary: Secondary | ICD-10-CM | POA: Diagnosis present

## 2018-06-12 DIAGNOSIS — G709 Myoneural disorder, unspecified: Secondary | ICD-10-CM | POA: Diagnosis not present

## 2018-06-12 DIAGNOSIS — G620 Drug-induced polyneuropathy: Secondary | ICD-10-CM | POA: Diagnosis present

## 2018-06-12 DIAGNOSIS — J9601 Acute respiratory failure with hypoxia: Secondary | ICD-10-CM | POA: Diagnosis present

## 2018-06-12 DIAGNOSIS — J9811 Atelectasis: Secondary | ICD-10-CM | POA: Diagnosis not present

## 2018-06-12 DIAGNOSIS — E0500 Thyrotoxicosis with diffuse goiter without thyrotoxic crisis or storm: Secondary | ICD-10-CM | POA: Diagnosis not present

## 2018-06-12 DIAGNOSIS — Z4682 Encounter for fitting and adjustment of non-vascular catheter: Secondary | ICD-10-CM | POA: Diagnosis not present

## 2018-06-12 DIAGNOSIS — Z79890 Hormone replacement therapy (postmenopausal): Secondary | ICD-10-CM | POA: Diagnosis not present

## 2018-06-12 DIAGNOSIS — Z886 Allergy status to analgesic agent status: Secondary | ICD-10-CM | POA: Diagnosis not present

## 2018-06-12 DIAGNOSIS — J323 Chronic sphenoidal sinusitis: Secondary | ICD-10-CM | POA: Diagnosis not present

## 2018-06-12 DIAGNOSIS — R202 Paresthesia of skin: Secondary | ICD-10-CM | POA: Diagnosis not present

## 2018-06-12 DIAGNOSIS — N390 Urinary tract infection, site not specified: Secondary | ICD-10-CM | POA: Diagnosis present

## 2018-06-12 DIAGNOSIS — R29898 Other symptoms and signs involving the musculoskeletal system: Secondary | ICD-10-CM | POA: Diagnosis not present

## 2018-06-12 DIAGNOSIS — G610 Guillain-Barre syndrome: Secondary | ICD-10-CM | POA: Diagnosis present

## 2018-06-12 DIAGNOSIS — G909 Disorder of the autonomic nervous system, unspecified: Secondary | ICD-10-CM | POA: Diagnosis not present

## 2018-06-12 DIAGNOSIS — M4802 Spinal stenosis, cervical region: Secondary | ICD-10-CM | POA: Diagnosis not present

## 2018-06-12 DIAGNOSIS — R0989 Other specified symptoms and signs involving the circulatory and respiratory systems: Secondary | ICD-10-CM | POA: Diagnosis not present

## 2018-06-12 DIAGNOSIS — J9602 Acute respiratory failure with hypercapnia: Secondary | ICD-10-CM | POA: Diagnosis not present

## 2018-06-12 DIAGNOSIS — I4581 Long QT syndrome: Secondary | ICD-10-CM | POA: Diagnosis not present

## 2018-06-12 DIAGNOSIS — R270 Ataxia, unspecified: Secondary | ICD-10-CM | POA: Diagnosis not present

## 2018-06-12 DIAGNOSIS — J690 Pneumonitis due to inhalation of food and vomit: Secondary | ICD-10-CM | POA: Diagnosis present

## 2018-06-12 DIAGNOSIS — M4316 Spondylolisthesis, lumbar region: Secondary | ICD-10-CM | POA: Diagnosis not present

## 2018-06-12 DIAGNOSIS — M79606 Pain in leg, unspecified: Secondary | ICD-10-CM | POA: Diagnosis not present

## 2018-06-12 DIAGNOSIS — Z743 Need for continuous supervision: Secondary | ICD-10-CM | POA: Diagnosis not present

## 2018-06-12 DIAGNOSIS — M47812 Spondylosis without myelopathy or radiculopathy, cervical region: Secondary | ICD-10-CM | POA: Diagnosis not present

## 2018-06-12 DIAGNOSIS — R918 Other nonspecific abnormal finding of lung field: Secondary | ICD-10-CM | POA: Diagnosis not present

## 2018-06-15 ENCOUNTER — Other Ambulatory Visit: Payer: Self-pay | Admitting: Oncology

## 2018-06-17 DIAGNOSIS — Z90710 Acquired absence of both cervix and uterus: Secondary | ICD-10-CM | POA: Diagnosis not present

## 2018-06-17 DIAGNOSIS — E890 Postprocedural hypothyroidism: Secondary | ICD-10-CM | POA: Diagnosis present

## 2018-06-17 DIAGNOSIS — G610 Guillain-Barre syndrome: Secondary | ICD-10-CM | POA: Insufficient documentation

## 2018-06-17 DIAGNOSIS — G620 Drug-induced polyneuropathy: Secondary | ICD-10-CM | POA: Diagnosis present

## 2018-06-17 DIAGNOSIS — Z4682 Encounter for fitting and adjustment of non-vascular catheter: Secondary | ICD-10-CM | POA: Diagnosis not present

## 2018-06-17 DIAGNOSIS — G709 Myoneural disorder, unspecified: Secondary | ICD-10-CM | POA: Diagnosis not present

## 2018-06-17 DIAGNOSIS — R29898 Other symptoms and signs involving the musculoskeletal system: Secondary | ICD-10-CM | POA: Diagnosis not present

## 2018-06-17 DIAGNOSIS — Z8543 Personal history of malignant neoplasm of ovary: Secondary | ICD-10-CM | POA: Diagnosis not present

## 2018-06-17 DIAGNOSIS — R531 Weakness: Secondary | ICD-10-CM | POA: Diagnosis not present

## 2018-06-17 DIAGNOSIS — Z452 Encounter for adjustment and management of vascular access device: Secondary | ICD-10-CM | POA: Diagnosis not present

## 2018-06-17 DIAGNOSIS — G4700 Insomnia, unspecified: Secondary | ICD-10-CM | POA: Diagnosis not present

## 2018-06-17 DIAGNOSIS — J9601 Acute respiratory failure with hypoxia: Secondary | ICD-10-CM | POA: Diagnosis present

## 2018-06-17 DIAGNOSIS — J9602 Acute respiratory failure with hypercapnia: Secondary | ICD-10-CM | POA: Diagnosis present

## 2018-06-17 DIAGNOSIS — T451X5A Adverse effect of antineoplastic and immunosuppressive drugs, initial encounter: Secondary | ICD-10-CM | POA: Diagnosis present

## 2018-06-17 DIAGNOSIS — R918 Other nonspecific abnormal finding of lung field: Secondary | ICD-10-CM | POA: Diagnosis not present

## 2018-06-17 DIAGNOSIS — J90 Pleural effusion, not elsewhere classified: Secondary | ICD-10-CM | POA: Diagnosis not present

## 2018-06-17 DIAGNOSIS — J690 Pneumonitis due to inhalation of food and vomit: Secondary | ICD-10-CM | POA: Diagnosis not present

## 2018-06-17 DIAGNOSIS — R339 Retention of urine, unspecified: Secondary | ICD-10-CM | POA: Diagnosis present

## 2018-06-17 DIAGNOSIS — E785 Hyperlipidemia, unspecified: Secondary | ICD-10-CM | POA: Diagnosis present

## 2018-06-17 DIAGNOSIS — Z87891 Personal history of nicotine dependence: Secondary | ICD-10-CM | POA: Diagnosis not present

## 2018-06-17 DIAGNOSIS — E0500 Thyrotoxicosis with diffuse goiter without thyrotoxic crisis or storm: Secondary | ICD-10-CM | POA: Diagnosis present

## 2018-06-18 ENCOUNTER — Inpatient Hospital Stay: Payer: Medicare Other

## 2018-06-18 ENCOUNTER — Inpatient Hospital Stay: Payer: Medicare Other | Admitting: Oncology

## 2018-06-19 ENCOUNTER — Encounter: Payer: Self-pay | Admitting: Oncology

## 2018-06-20 MED ORDER — SENNOSIDES-DOCUSATE SODIUM 8.6-50 MG PO TABS
2.00 | ORAL_TABLET | ORAL | Status: DC
Start: 2018-06-20 — End: 2018-06-20

## 2018-06-20 MED ORDER — OXYCODONE HCL 5 MG PO TABS
5.00 | ORAL_TABLET | ORAL | Status: DC
Start: ? — End: 2018-06-20

## 2018-06-20 MED ORDER — GABAPENTIN 300 MG PO CAPS
300.00 | ORAL_CAPSULE | ORAL | Status: DC
Start: 2018-06-20 — End: 2018-06-20

## 2018-06-20 MED ORDER — SODIUM CHLORIDE 0.9 % IV SOLN
INTRAVENOUS | Status: DC
Start: ? — End: 2018-06-20

## 2018-06-20 MED ORDER — DEXAMETHASONE SODIUM PHOSPHATE 4 MG/ML IJ SOLN
10.00 | INTRAMUSCULAR | Status: DC
Start: 2018-06-20 — End: 2018-06-20

## 2018-06-20 MED ORDER — ACETAMINOPHEN 325 MG PO TABS
650.00 | ORAL_TABLET | ORAL | Status: DC
Start: ? — End: 2018-06-20

## 2018-06-20 MED ORDER — ONDANSETRON HCL 4 MG/2ML IJ SOLN
4.00 | INTRAMUSCULAR | Status: DC
Start: ? — End: 2018-06-20

## 2018-06-20 MED ORDER — POLYETHYLENE GLYCOL 3350 17 G PO PACK
17.00 | PACK | ORAL | Status: DC
Start: 2018-06-21 — End: 2018-06-20

## 2018-06-20 MED ORDER — GENERIC EXTERNAL MEDICATION
Status: DC
Start: ? — End: 2018-06-20

## 2018-06-20 MED ORDER — DEXTROSE 50 % IV SOLN
12.50 | INTRAVENOUS | Status: DC
Start: ? — End: 2018-06-20

## 2018-06-20 MED ORDER — HYDRALAZINE HCL 20 MG/ML IJ SOLN
10.00 | INTRAMUSCULAR | Status: DC
Start: ? — End: 2018-06-20

## 2018-06-20 MED ORDER — CARBOXYMETHYLCELLULOSE SOD PF 0.5 % OP SOLN
1.00 | OPHTHALMIC | Status: DC
Start: ? — End: 2018-06-20

## 2018-06-20 MED ORDER — MELATONIN 3 MG PO TABS
3.00 | ORAL_TABLET | ORAL | Status: DC
Start: 2018-06-20 — End: 2018-06-20

## 2018-06-20 MED ORDER — GLUCAGON HCL RDNA (DIAGNOSTIC) 1 MG IJ SOLR
1.00 | INTRAMUSCULAR | Status: DC
Start: ? — End: 2018-06-20

## 2018-06-20 MED ORDER — GENERIC EXTERNAL MEDICATION
12.50 | Status: DC
Start: ? — End: 2018-06-20

## 2018-06-20 MED ORDER — INSULIN REGULAR HUMAN 100 UNIT/ML IJ SOLN
0.00 | INTRAMUSCULAR | Status: DC
Start: 2018-06-20 — End: 2018-06-20

## 2018-06-20 MED ORDER — RANITIDINE HCL 150 MG PO TABS
150.00 | ORAL_TABLET | ORAL | Status: DC
Start: 2018-06-20 — End: 2018-06-20

## 2018-06-20 MED ORDER — GENERIC EXTERNAL MEDICATION
175.00 | Status: DC
Start: 2018-06-21 — End: 2018-06-20

## 2018-06-20 MED ORDER — BISACODYL 10 MG RE SUPP
10.00 | RECTAL | Status: DC
Start: ? — End: 2018-06-20

## 2018-06-20 MED ORDER — CHLORHEXIDINE GLUCONATE 0.12 % MT SOLN
5.00 | OROMUCOSAL | Status: DC
Start: 2018-06-20 — End: 2018-06-20

## 2018-06-26 ENCOUNTER — Encounter: Payer: Self-pay | Admitting: Internal Medicine

## 2018-06-26 DIAGNOSIS — G610 Guillain-Barre syndrome: Secondary | ICD-10-CM | POA: Insufficient documentation

## 2018-06-28 DIAGNOSIS — R29898 Other symptoms and signs involving the musculoskeletal system: Secondary | ICD-10-CM | POA: Diagnosis not present

## 2018-06-28 DIAGNOSIS — R159 Full incontinence of feces: Secondary | ICD-10-CM | POA: Diagnosis not present

## 2018-06-28 DIAGNOSIS — G610 Guillain-Barre syndrome: Secondary | ICD-10-CM | POA: Diagnosis present

## 2018-06-28 DIAGNOSIS — Z87891 Personal history of nicotine dependence: Secondary | ICD-10-CM | POA: Diagnosis not present

## 2018-06-28 DIAGNOSIS — Z7401 Bed confinement status: Secondary | ICD-10-CM | POA: Diagnosis not present

## 2018-06-28 DIAGNOSIS — E0500 Thyrotoxicosis with diffuse goiter without thyrotoxic crisis or storm: Secondary | ICD-10-CM | POA: Diagnosis present

## 2018-06-28 DIAGNOSIS — F419 Anxiety disorder, unspecified: Secondary | ICD-10-CM | POA: Diagnosis not present

## 2018-06-28 DIAGNOSIS — E785 Hyperlipidemia, unspecified: Secondary | ICD-10-CM | POA: Diagnosis present

## 2018-06-28 DIAGNOSIS — M549 Dorsalgia, unspecified: Secondary | ICD-10-CM | POA: Diagnosis present

## 2018-06-28 DIAGNOSIS — E876 Hypokalemia: Secondary | ICD-10-CM | POA: Diagnosis not present

## 2018-06-28 DIAGNOSIS — K5900 Constipation, unspecified: Secondary | ICD-10-CM | POA: Diagnosis not present

## 2018-06-28 DIAGNOSIS — M6281 Muscle weakness (generalized): Secondary | ICD-10-CM | POA: Diagnosis not present

## 2018-06-28 DIAGNOSIS — R202 Paresthesia of skin: Secondary | ICD-10-CM | POA: Diagnosis present

## 2018-06-28 DIAGNOSIS — R531 Weakness: Secondary | ICD-10-CM | POA: Diagnosis present

## 2018-06-28 DIAGNOSIS — M25511 Pain in right shoulder: Secondary | ICD-10-CM | POA: Diagnosis present

## 2018-06-28 DIAGNOSIS — G620 Drug-induced polyneuropathy: Secondary | ICD-10-CM | POA: Diagnosis present

## 2018-06-28 DIAGNOSIS — T451X5A Adverse effect of antineoplastic and immunosuppressive drugs, initial encounter: Secondary | ICD-10-CM | POA: Diagnosis present

## 2018-06-28 DIAGNOSIS — Z8543 Personal history of malignant neoplasm of ovary: Secondary | ICD-10-CM | POA: Diagnosis not present

## 2018-06-28 DIAGNOSIS — R5383 Other fatigue: Secondary | ICD-10-CM | POA: Diagnosis present

## 2018-06-28 DIAGNOSIS — R32 Unspecified urinary incontinence: Secondary | ICD-10-CM | POA: Diagnosis not present

## 2018-06-28 MED ORDER — FUROSEMIDE 20 MG PO TABS
20.00 | ORAL_TABLET | ORAL | Status: DC
Start: 2018-06-29 — End: 2018-06-28

## 2018-06-28 MED ORDER — MELATONIN 3 MG PO TABS
3.00 | ORAL_TABLET | ORAL | Status: DC
Start: 2018-06-28 — End: 2018-06-28

## 2018-06-28 MED ORDER — GENERIC EXTERNAL MEDICATION
175.00 | Status: DC
Start: 2018-06-29 — End: 2018-06-28

## 2018-06-28 MED ORDER — POLYETHYLENE GLYCOL 3350 17 G PO PACK
17.00 | PACK | ORAL | Status: DC
Start: 2018-06-29 — End: 2018-06-28

## 2018-06-28 MED ORDER — ENOXAPARIN SODIUM 40 MG/0.4ML ~~LOC~~ SOLN
40.00 | SUBCUTANEOUS | Status: DC
Start: 2018-06-29 — End: 2018-06-28

## 2018-06-28 MED ORDER — ACETAMINOPHEN 325 MG PO TABS
650.00 | ORAL_TABLET | ORAL | Status: DC
Start: ? — End: 2018-06-28

## 2018-06-28 MED ORDER — GABAPENTIN 300 MG PO CAPS
300.00 | ORAL_CAPSULE | ORAL | Status: DC
Start: 2018-06-28 — End: 2018-06-28

## 2018-06-28 MED ORDER — SENNOSIDES-DOCUSATE SODIUM 8.6-50 MG PO TABS
2.00 | ORAL_TABLET | ORAL | Status: DC
Start: 2018-06-28 — End: 2018-06-28

## 2018-06-28 MED ORDER — POTASSIUM CHLORIDE 20 MEQ PO PACK
20.00 | PACK | ORAL | Status: DC
Start: 2018-06-29 — End: 2018-06-28

## 2018-06-28 MED ORDER — LIDOCAINE 5 % EX PTCH
1.00 | MEDICATED_PATCH | CUTANEOUS | Status: DC
Start: 2018-06-28 — End: 2018-06-28

## 2018-07-03 ENCOUNTER — Other Ambulatory Visit: Payer: Self-pay | Admitting: Oncology

## 2018-07-04 ENCOUNTER — Other Ambulatory Visit: Payer: Self-pay | Admitting: Oncology

## 2018-07-15 ENCOUNTER — Ambulatory Visit: Payer: Medicare Other | Admitting: Urology

## 2018-07-17 ENCOUNTER — Inpatient Hospital Stay: Payer: Medicare Other | Admitting: Internal Medicine

## 2018-07-30 ENCOUNTER — Other Ambulatory Visit: Payer: Self-pay | Admitting: Internal Medicine

## 2018-07-30 DIAGNOSIS — R600 Localized edema: Secondary | ICD-10-CM

## 2018-07-30 MED ORDER — FUROSEMIDE 20 MG PO TABS: 20.0000 mg | ORAL_TABLET | Freq: Every day | ORAL | 2 refills | Status: DC | PRN

## 2018-07-31 ENCOUNTER — Inpatient Hospital Stay: Payer: Medicare Other

## 2018-08-02 MED ORDER — FUROSEMIDE 20 MG PO TABS
20.00 | ORAL_TABLET | ORAL | Status: DC
Start: 2018-08-03 — End: 2018-08-02

## 2018-08-02 MED ORDER — LORAZEPAM 0.5 MG PO TABS
0.50 | ORAL_TABLET | ORAL | Status: DC
Start: 2018-08-02 — End: 2018-08-02

## 2018-08-02 MED ORDER — DICLOFENAC SODIUM 1 % TD GEL
2.00 | TRANSDERMAL | Status: DC
Start: 2018-08-02 — End: 2018-08-02

## 2018-08-02 MED ORDER — ENOXAPARIN SODIUM 40 MG/0.4ML ~~LOC~~ SOLN
40.00 | SUBCUTANEOUS | Status: DC
Start: 2018-08-03 — End: 2018-08-02

## 2018-08-02 MED ORDER — POTASSIUM CHLORIDE CRYS ER 20 MEQ PO TBCR
20.00 | EXTENDED_RELEASE_TABLET | ORAL | Status: DC
Start: 2018-08-03 — End: 2018-08-02

## 2018-08-02 MED ORDER — ONDANSETRON HCL 4 MG PO TABS
4.00 | ORAL_TABLET | ORAL | Status: DC
Start: ? — End: 2018-08-02

## 2018-08-02 MED ORDER — GENERIC EXTERNAL MEDICATION
175.00 | Status: DC
Start: 2018-08-03 — End: 2018-08-02

## 2018-08-02 MED ORDER — ACETAMINOPHEN 325 MG PO TABS
650.00 | ORAL_TABLET | ORAL | Status: DC
Start: ? — End: 2018-08-02

## 2018-08-02 MED ORDER — CELECOXIB 100 MG PO CAPS
100.00 | ORAL_CAPSULE | ORAL | Status: DC
Start: 2018-08-02 — End: 2018-08-02

## 2018-08-02 MED ORDER — SENNOSIDES-DOCUSATE SODIUM 8.6-50 MG PO TABS
1.00 | ORAL_TABLET | ORAL | Status: DC
Start: 2018-08-03 — End: 2018-08-02

## 2018-08-02 MED ORDER — SODIUM CHLORIDE FLUSH 0.9 % IV SOLN
10.00 | INTRAVENOUS | Status: DC
Start: ? — End: 2018-08-02

## 2018-08-02 MED ORDER — DULOXETINE HCL 30 MG PO CPEP
30.00 | ORAL_CAPSULE | ORAL | Status: DC
Start: 2018-08-03 — End: 2018-08-02

## 2018-08-02 MED ORDER — GENERIC EXTERNAL MEDICATION
3.00 | Status: DC
Start: ? — End: 2018-08-02

## 2018-08-02 MED ORDER — GENERIC EXTERNAL MEDICATION
1.00 | Status: DC
Start: ? — End: 2018-08-02

## 2018-08-02 MED ORDER — CHOLECALCIFEROL 1.25 MG (50000 UT) PO CAPS
50000.00 | ORAL_CAPSULE | ORAL | Status: DC
Start: 2018-08-07 — End: 2018-08-02

## 2018-08-02 MED ORDER — OXYBUTYNIN CHLORIDE ER 10 MG PO TB24
10.00 | ORAL_TABLET | ORAL | Status: DC
Start: 2018-08-02 — End: 2018-08-02

## 2018-08-02 MED ORDER — LIDOCAINE 5 % EX PTCH
1.00 | MEDICATED_PATCH | CUTANEOUS | Status: DC
Start: 2018-08-02 — End: 2018-08-02

## 2018-08-02 MED ORDER — LORAZEPAM 0.5 MG PO TABS
.50 | ORAL_TABLET | ORAL | Status: DC
Start: ? — End: 2018-08-02

## 2018-08-02 MED ORDER — GABAPENTIN 300 MG PO CAPS
300.00 | ORAL_CAPSULE | ORAL | Status: DC
Start: 2018-08-03 — End: 2018-08-02

## 2018-08-05 ENCOUNTER — Telehealth: Payer: Self-pay

## 2018-08-05 ENCOUNTER — Telehealth: Payer: Self-pay | Admitting: Internal Medicine

## 2018-08-05 ENCOUNTER — Other Ambulatory Visit: Payer: Self-pay | Admitting: *Deleted

## 2018-08-05 DIAGNOSIS — Z79899 Other long term (current) drug therapy: Secondary | ICD-10-CM | POA: Diagnosis not present

## 2018-08-05 DIAGNOSIS — C561 Malignant neoplasm of right ovary: Secondary | ICD-10-CM

## 2018-08-05 DIAGNOSIS — T451X5D Adverse effect of antineoplastic and immunosuppressive drugs, subsequent encounter: Secondary | ICD-10-CM | POA: Diagnosis not present

## 2018-08-05 DIAGNOSIS — G610 Guillain-Barre syndrome: Secondary | ICD-10-CM | POA: Diagnosis not present

## 2018-08-05 DIAGNOSIS — Z87891 Personal history of nicotine dependence: Secondary | ICD-10-CM | POA: Diagnosis not present

## 2018-08-05 DIAGNOSIS — E876 Hypokalemia: Secondary | ICD-10-CM | POA: Diagnosis not present

## 2018-08-05 DIAGNOSIS — F419 Anxiety disorder, unspecified: Secondary | ICD-10-CM | POA: Diagnosis not present

## 2018-08-05 DIAGNOSIS — J9601 Acute respiratory failure with hypoxia: Secondary | ICD-10-CM | POA: Diagnosis not present

## 2018-08-05 DIAGNOSIS — E0590 Thyrotoxicosis, unspecified without thyrotoxic crisis or storm: Secondary | ICD-10-CM | POA: Diagnosis not present

## 2018-08-05 DIAGNOSIS — G620 Drug-induced polyneuropathy: Secondary | ICD-10-CM | POA: Diagnosis not present

## 2018-08-05 MED ORDER — LIDOCAINE-PRILOCAINE 2.5-2.5 % EX CREA: TOPICAL_CREAM | CUTANEOUS | 3 refills | Status: DC

## 2018-08-05 NOTE — Telephone Encounter (Signed)
Ok cancel appt upcoming this is true she has been in the hospital and rehab Call pt to see how she is doing after long hospital stay?   [place]

## 2018-08-05 NOTE — Telephone Encounter (Signed)
Anyplace you want to add?  Copied from [place] [phone]. Topic: Appointment Scheduling - Scheduling Inquiry for Clinic >> Aug 05, 2018  2:40 PM Rutherford Nail, [place] wrote: Reason for CRM: patient had to cancel hospital follow up for 08/07/18 due to having to get a crown put on at 9am that morning. Dr Aundra Dubin does not have anything available the rest of the week. Patient's friend does not wish to have her wait until Thursday  08/15/18 for her next available. Please advise. Would like to get her fit in this week, if possible. CB#:[phone] Emergency planning/management officer)

## 2018-08-05 NOTE — Telephone Encounter (Signed)
Pt was called confirming appt. Pt states she cannot make it to her HFU appt for 08/07/2018. I tried to get pt to resch appt pt states she will call back she has to check other appts. Thank you!

## 2018-08-05 NOTE — Telephone Encounter (Signed)
fyi

## 2018-08-05 NOTE — Telephone Encounter (Signed)
Patient stated she was ok with canceling appt.  She is feeling fine since coming out of hospital. She wants to schedule once she knows when the pt will be.

## 2018-08-06 ENCOUNTER — Other Ambulatory Visit: Payer: Self-pay | Admitting: Internal Medicine

## 2018-08-06 DIAGNOSIS — R600 Localized edema: Secondary | ICD-10-CM

## 2018-08-06 MED ORDER — FUROSEMIDE 20 MG PO TABS: 20.0000 mg | ORAL_TABLET | Freq: Every day | ORAL | 2 refills | Status: DC | PRN

## 2018-08-06 NOTE — Telephone Encounter (Signed)
11:30 on 08/08/18 if she wants to come then    Melissa Anderson

## 2018-08-06 NOTE — Telephone Encounter (Signed)
Left message for patient to return call back. [facility] may give and obtain information. Schedule appointment.

## 2018-08-07 ENCOUNTER — Inpatient Hospital Stay: Payer: Medicare Other | Admitting: Internal Medicine

## 2018-08-07 DIAGNOSIS — C561 Malignant neoplasm of right ovary: Secondary | ICD-10-CM | POA: Diagnosis not present

## 2018-08-07 DIAGNOSIS — J9601 Acute respiratory failure with hypoxia: Secondary | ICD-10-CM | POA: Diagnosis not present

## 2018-08-07 DIAGNOSIS — G610 Guillain-Barre syndrome: Secondary | ICD-10-CM | POA: Diagnosis not present

## 2018-08-07 DIAGNOSIS — E876 Hypokalemia: Secondary | ICD-10-CM | POA: Diagnosis not present

## 2018-08-07 DIAGNOSIS — T451X5D Adverse effect of antineoplastic and immunosuppressive drugs, subsequent encounter: Secondary | ICD-10-CM | POA: Diagnosis not present

## 2018-08-07 DIAGNOSIS — G620 Drug-induced polyneuropathy: Secondary | ICD-10-CM | POA: Diagnosis not present

## 2018-08-08 ENCOUNTER — Encounter: Payer: Self-pay | Admitting: Oncology

## 2018-08-08 ENCOUNTER — Inpatient Hospital Stay (HOSPITAL_BASED_OUTPATIENT_CLINIC_OR_DEPARTMENT_OTHER): Payer: Medicare Other | Admitting: Oncology

## 2018-08-08 ENCOUNTER — Inpatient Hospital Stay: Payer: Medicare Other | Attending: Oncology

## 2018-08-08 VITALS — BP 117/71 | HR 100 | Temp 97.9°F | Resp 18 | Ht 66.0 in | Wt 175.3 lb

## 2018-08-08 DIAGNOSIS — Z9221 Personal history of antineoplastic chemotherapy: Secondary | ICD-10-CM | POA: Insufficient documentation

## 2018-08-08 DIAGNOSIS — Z79899 Other long term (current) drug therapy: Secondary | ICD-10-CM

## 2018-08-08 DIAGNOSIS — Z87891 Personal history of nicotine dependence: Secondary | ICD-10-CM | POA: Diagnosis not present

## 2018-08-08 DIAGNOSIS — G610 Guillain-Barre syndrome: Secondary | ICD-10-CM | POA: Diagnosis not present

## 2018-08-08 DIAGNOSIS — C561 Malignant neoplasm of right ovary: Secondary | ICD-10-CM

## 2018-08-08 DIAGNOSIS — Z808 Family history of malignant neoplasm of other organs or systems: Secondary | ICD-10-CM | POA: Insufficient documentation

## 2018-08-08 DIAGNOSIS — Z803 Family history of malignant neoplasm of breast: Secondary | ICD-10-CM | POA: Diagnosis not present

## 2018-08-08 DIAGNOSIS — Z9079 Acquired absence of other genital organ(s): Secondary | ICD-10-CM | POA: Diagnosis not present

## 2018-08-08 DIAGNOSIS — Z800 Family history of malignant neoplasm of digestive organs: Secondary | ICD-10-CM | POA: Insufficient documentation

## 2018-08-08 DIAGNOSIS — E876 Hypokalemia: Secondary | ICD-10-CM | POA: Diagnosis not present

## 2018-08-08 DIAGNOSIS — E0500 Thyrotoxicosis with diffuse goiter without thyrotoxic crisis or storm: Secondary | ICD-10-CM

## 2018-08-08 DIAGNOSIS — Z90722 Acquired absence of ovaries, bilateral: Secondary | ICD-10-CM | POA: Insufficient documentation

## 2018-08-08 DIAGNOSIS — J9601 Acute respiratory failure with hypoxia: Secondary | ICD-10-CM | POA: Diagnosis not present

## 2018-08-08 DIAGNOSIS — Z90710 Acquired absence of both cervix and uterus: Secondary | ICD-10-CM

## 2018-08-08 DIAGNOSIS — Z8042 Family history of malignant neoplasm of prostate: Secondary | ICD-10-CM

## 2018-08-08 DIAGNOSIS — G620 Drug-induced polyneuropathy: Secondary | ICD-10-CM | POA: Diagnosis not present

## 2018-08-08 DIAGNOSIS — T451X5D Adverse effect of antineoplastic and immunosuppressive drugs, subsequent encounter: Secondary | ICD-10-CM | POA: Diagnosis not present

## 2018-08-08 LAB — CBC WITH DIFFERENTIAL/PLATELET
Abs Immature Granulocytes: 0.01 10*3/uL (ref 0.00–0.07)
Basophils Absolute: 0.1 10*3/uL (ref 0.0–0.1)
Basophils Relative: 2 %
Eosinophils Absolute: 0.4 10*3/uL (ref 0.0–0.5)
Eosinophils Relative: 8 %
HCT: 37.2 % (ref 36.0–46.0)
Hemoglobin: 11.8 g/dL — ABNORMAL LOW (ref 12.0–15.0)
Immature Granulocytes: 0 %
Lymphocytes Relative: 22 %
Lymphs Abs: 1.1 10*3/uL (ref 0.7–4.0)
MCH: 31.5 pg (ref 26.0–34.0)
MCHC: 31.7 g/dL (ref 30.0–36.0)
MCV: 99.2 fL (ref 80.0–100.0)
MONO ABS: 0.6 10*3/uL (ref 0.1–1.0)
Monocytes Relative: 11 %
Neutro Abs: 3 10*3/uL (ref 1.7–7.7)
Neutrophils Relative %: 57 %
Platelets: 278 10*3/uL (ref 150–400)
RBC: 3.75 MIL/uL — ABNORMAL LOW (ref 3.87–5.11)
RDW: 12.4 % (ref 11.5–15.5)
WBC: 5.2 10*3/uL (ref 4.0–10.5)
nRBC: 0 % (ref 0.0–0.2)

## 2018-08-08 LAB — COMPREHENSIVE METABOLIC PANEL
ALT: 19 U/L (ref 0–44)
AST: 22 U/L (ref 15–41)
Albumin: 3.7 g/dL (ref 3.5–5.0)
Alkaline Phosphatase: 63 U/L (ref 38–126)
Anion gap: 6 (ref 5–15)
BUN: 25 mg/dL — ABNORMAL HIGH (ref 8–23)
CO2: 26 mmol/L (ref 22–32)
Calcium: 9.2 mg/dL (ref 8.9–10.3)
Chloride: 108 mmol/L (ref 98–111)
Creatinine, Ser: 0.69 mg/dL (ref 0.44–1.00)
GFR calc Af Amer: >60 mL/min (ref >60)
GFR calc non Af Amer: >60 mL/min (ref >60)
Glucose, Bld: 112 mg/dL — ABNORMAL HIGH (ref 70–99)
Potassium: 4.3 mmol/L (ref 3.5–5.1)
Sodium: 140 mmol/L (ref 135–145)
Total Bilirubin: 0.5 mg/dL (ref 0.3–1.2)
Total Protein: 6.4 g/dL — ABNORMAL LOW (ref 6.5–8.1)

## 2018-08-08 MED ORDER — SODIUM CHLORIDE 0.9% FLUSH
10.0000 mL | Freq: Once | INTRAVENOUS | Status: AC
  Administered 2018-08-08: 10 mL via INTRAVENOUS
  Filled 2018-08-08: qty 10

## 2018-08-08 MED ORDER — LORAZEPAM 0.5 MG PO TABS: 0.5000 mg | ORAL_TABLET | Freq: Every day | ORAL | 0 refills | Status: DC

## 2018-08-08 MED ORDER — ALPRAZOLAM 0.5 MG PO TABS: 0.5000 mg | ORAL_TABLET | Freq: Every evening | ORAL | 0 refills | Status: DC | PRN

## 2018-08-08 MED ORDER — HEPARIN SOD (PORK) LOCK FLUSH 100 UNIT/ML IV SOLN
500.0000 [IU] | Freq: Once | INTRAVENOUS | Status: AC
  Administered 2018-08-08: 500 [IU] via INTRAVENOUS
  Filled 2018-08-08: qty 5

## 2018-08-09 ENCOUNTER — Telehealth: Payer: Self-pay | Admitting: *Deleted

## 2018-08-09 DIAGNOSIS — G620 Drug-induced polyneuropathy: Secondary | ICD-10-CM | POA: Diagnosis not present

## 2018-08-09 DIAGNOSIS — J9601 Acute respiratory failure with hypoxia: Secondary | ICD-10-CM | POA: Diagnosis not present

## 2018-08-09 DIAGNOSIS — T451X5D Adverse effect of antineoplastic and immunosuppressive drugs, subsequent encounter: Secondary | ICD-10-CM | POA: Diagnosis not present

## 2018-08-09 DIAGNOSIS — G610 Guillain-Barre syndrome: Secondary | ICD-10-CM | POA: Diagnosis not present

## 2018-08-09 DIAGNOSIS — E876 Hypokalemia: Secondary | ICD-10-CM | POA: Diagnosis not present

## 2018-08-09 DIAGNOSIS — C561 Malignant neoplasm of right ovary: Secondary | ICD-10-CM | POA: Diagnosis not present

## 2018-08-09 NOTE — Progress Notes (Signed)
Hematology/Oncology Consult note [facility]  [phone] [phone]  Patient Care Team: McLean-Scocuzza, Nino Glow, MD as PCP - General (Internal Medicine) Gillis Ends, MD as Referring Physician (Obstetrics and Gynecology) Bary Castilla Forest Gleason, MD as Surgeon (General Surgery) Sindy Guadeloupe, MD as Consulting Physician (Oncology)   Name of the patient: Melissa Anderson  [id number]  1941/05/07   Date of visit: 08/09/18  Diagnosis- Stage II ovarian cancer   Chief complaint/ Reason for visit-post hospital discharge follow-up  Heme/Onc history: patient is a 77 year old female with a past medical history significant for hyperlipidemia and Graves' disease. She was recently complaining of back pain which led to an MRI of the lumbosacral spine. MRI in March 2019 which incidentally showed a multicystic 4 x 5.5 cm right adnexal mass with blood fluid levels. Prominent uterine endometrial stripe.  This was followed by a pelvic ultrasound in April 2019 which showed uterus measuring 9.7 x 4.1 x 5.9 cm. Multiloculated complex cystic structure in the right adnexa measuring 6.3 x 6.1 x 4.7 cm. It has solid-appearing components as well as anechoic portions separated by thick septations. No normal ovarian parenchyma is identified. No pelvic free fluid. Left ovary was not seen.  This was not further evaluated until patient's met with her gynecologist and was then referred to gynecology oncology Dr. Carlton Adam at [facility]. Patient is Dr. Kem Parkinson mother and wanted to get further care here at [facility] and was seen by Dr. Theora Gianotti on 02/12/2018.  She had a CT scan of her abdomen and pelvis on 02/09/2018 which showed multiseptated complex cystic lesion in the right pelvis. At adnexa immediately superior to the uterus measuring 6.9 x 5.3 cm. Abnormal thickening of the endometrial cavity measuring 1.3 cm. 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 Tumor is positive for PAX 8, WT 1 and p53. 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 3 lymph nodes negative for malignancy on the right and the left side. Omental biopsy was negative. Peritoneal biopsy was negative for malignancy. pT2a pN0  Baseline tumor markers including CA125 normal at 18  Cycle 1 of chemotherapy was given on 03/21/2018.  Patient received cycle 4 of chemotherapy on 05/28/2018.  After 4 cycles of chemotherapy patient developed progressive weakness in her bilateral lower extremities and was admitted to the hospital in [place] where she developed worsening respiratory failure and had to be intubated was airlifted to [place].  Patient was eventually extubated.  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  She was given IVIG as well.  Of note she has received flu shot consistently in the past and has never had these complications ever.  Patient was then sent to a neuro rehab unit and was eventually discharged home   Interval history-patient reports that since her discharge from the rehab unit she is consistently making good progress.  She is currently ambulating with a walker and will soon transition to a cane.  She has not had any falls at home.  Her  appetite is good and she is working towards getting her weight back.  ECOG PS- 2 Pain scale- 0 Opioid associated constipation- no  Review of  systems- Review of Systems  Constitutional: Positive for malaise/fatigue.  Neurological: Positive for weakness.       Allergies  Allergen Reactions  . Crestor [Rosuvastatin]     Myopathy    . Epinephrine Other (See Comments)    Funny feeling, heart racing   . Influenza Vaccines     ?GBS 05/2018   . Lipitor [Atorvastatin Calcium]     Myalgias       Past Medical History:  Diagnosis Date  . Cancer ([place]) 2019   b/l ovaries and left fallopian tube pelvic lymph nodes negative   . Hyperlipidemia   . Overactive bladder   . Thyroid disease   . UTI (urinary tract infection)   . Vitamin D deficiency      Past Surgical History:  Procedure Laterality Date  . ABDOMINAL HYSTERECTOMY     02/15/18   . CHOLECYSTECTOMY  2008  . LAPAROSCOPIC VAGINAL HYSTERECTOMY WITH SALPINGO OOPHORECTOMY  02/15/2018  . PORTACATH PLACEMENT Left 02/25/2018   Procedure: INSERTION PORT-A-CATH;  Surgeon: Robert Bellow, MD;  Location: [facility] ORS;  Service: General;  Laterality: Left;  . TOTAL ABDOMINAL HYSTERECTOMY W/ BILATERAL SALPINGOOPHORECTOMY  02/15/2018   lymph nodes removed    Social History   Socioeconomic History  . Marital status: Widowed    Spouse name: Not on file  . Number of children: Not on file  . Years of education: Not on file  . Highest education level: Not on file  Occupational History  . Not on file  Social Needs  . Financial resource strain: Not on file  . Food insecurity:    Worry: Not on file    Inability: Not on file  . Transportation needs:    Medical: Not on file    Non-medical: Not on file  Tobacco Use  . Smoking status: Former Smoker    Packs/day: 1.00    Years: 25.00    Pack years: 25.00    Types: Cigarettes    Last attempt to quit: 02/26/1984    Years since quitting: 34.4  . Smokeless tobacco: Never Used  . Tobacco comment: age 36 to 37 1 ppd   Substance and Sexual Activity  . Alcohol use: Never    Frequency: Never  . Drug use: Never  . Sexual  activity: Not Currently  Lifestyle  . Physical activity:    Days per week: Not on file    Minutes per session: Not on file  . Stress: Not on file  Relationships  . Social connections:    Talks on phone: Not on file    Gets together: Not on file    Attends religious service: Not on file    Active member of club or organization: Not on file    Attends meetings of clubs or organizations: Not on file    Relationship status: Not on file  . Intimate partner violence:    Fear of current or ex partner: Not on file    Emotionally abused: Not on file    Physically abused: Not on file    Forced sexual activity: Not on file  Other Topics Concern  . Not on file  Social History Narrative   Engineer, maintenance (IT), bachelors degree    Lives in [place] here visiting daughter [name]    Former smoker quit age 55 y.o  Family History  Problem Relation Age of Onset  . Sudden death Mother 19       drowning in hurricane Katrina  . Pancreatic cancer Father        deceased early 4s; smoker  . Alcohol abuse Father   . COPD Father   . Coronary artery disease Brother   . Melanoma Sister        deceased at 29  . COPD Sister   . Prostate cancer Maternal Uncle        deceased 85  . Breast cancer Paternal Aunt        unsure of this dx     Current Outpatient Medications:  .  celecoxib (CELEBREX) 100 MG capsule, Take by mouth., Disp: , Rfl:  .  Cholecalciferol (D3-50) 1.25 MG (50000 UT) capsule, Take by mouth., Disp: , Rfl:  .  diclofenac sodium (VOLTAREN) 1 % GEL, Apply topically., Disp: , Rfl:  .  DULoxetine (CYMBALTA) 30 MG capsule, Take 1 capsule (30 mg total) by mouth daily. For 7 days and then 2 capsule daily for rest of month, Disp: 53 capsule, Rfl: 3 .  furosemide (LASIX) 20 MG tablet, Take 1 tablet (20 mg total) by mouth daily as needed. In am, Disp: 30 tablet, Rfl: 2 .  gabapentin (NEURONTIN) 300 MG capsule, Take 300 mg by mouth 2 (two) times daily. , Disp: , Rfl:  .  mirabegron ER (MYRBETRIQ) 50  MG TB24 tablet, Take 1 tablet (50 mg total) by mouth daily., Disp: 30 tablet, Rfl: 11 .  potassium chloride SA (K-DUR,KLOR-CON) 20 MEQ tablet, Take 20 mEq by mouth daily. , Disp: , Rfl:  .  SYNTHROID 175 MCG tablet, Take 1 tablet (175 mcg total) by mouth daily., Disp: 90 tablet, Rfl: 3 .  acetaminophen (TYLENOL) 500 MG tablet, Take 500 mg by mouth every 6 (six) hours as needed for moderate pain., Disp: , Rfl:  .  dexamethasone (DECADRON) 4 MG tablet, Take 2 tablets (8 mg total) by mouth daily. Start the day after chemotherapy for 2 days. (Patient not taking: Reported on 08/08/2018), Disp: 30 tablet, Rfl: 1 .  HYDROcodone-acetaminophen (NORCO/VICODIN) 5-325 MG tablet, Take 1 tablet by mouth every 4 (four) hours as needed for moderate pain. (Patient not taking: Reported on 08/08/2018), Disp: 45 tablet, Rfl: 0 .  lidocaine-prilocaine (EMLA) cream, Apply to affected area once (Patient not taking: Reported on 08/08/2018), Disp: 30 g, Rfl: 3 .  LORazepam (ATIVAN) 0.5 MG tablet, Take 1 tablet (0.5 mg total) by mouth at bedtime., Disp: 30 tablet, Rfl: 0 .  ondansetron (ZOFRAN) 8 MG tablet, Take 1 tablet (8 mg total) by mouth 2 (two) times daily as needed for refractory nausea / vomiting. Start on day 3 after chemo. (Patient not taking: Reported on 08/08/2018), Disp: 30 tablet, Rfl: 1 .  OVER THE COUNTER MEDICATION, Obagi facial cleansing otc system - use as directed, Disp: , Rfl:  .  prochlorperazine (COMPAZINE) 10 MG tablet, Take 1 tablet (10 mg total) by mouth every 6 (six) hours as needed (Nausea or vomiting). (Patient not taking: Reported on 08/08/2018), Disp: 30 tablet, Rfl: 1 .  rosuvastatin (CRESTOR) 10 MG tablet, TAKE 1 TABLET BY MOUTH EVERY DAY IN THE EVENING (Patient not taking: Reported on 08/08/2018), Disp: 90 tablet, Rfl: 3 .  VESICARE 5 MG tablet, Take 5 mg by mouth every evening. , Disp: , Rfl:  .  Vitamin D, Ergocalciferol, (DRISDOL) 50000 units CAPS capsule, TAKE 1 CAPSULE (50,000 UNITS TOTAL)  BY  MOUTH EVERY WEDNESDAY., Disp: 4 capsule, Rfl: 0 .  Vitamin D, Ergocalciferol, (DRISDOL) 50000 units CAPS capsule, TAKE 1 CAPSULE (50,000 UNITS TOTAL) BY MOUTH EVERY WEDNESDAY. (Patient not taking: Reported on 08/08/2018), Disp: 4 capsule, Rfl: 2  Physical exam:  Vitals:   08/08/18 0940  BP: 117/71  Pulse: 100  Resp: 18  Temp: 97.9 F (36.6 C)  TempSrc: Tympanic  SpO2: 94%  Weight: 175 lb 4.8 oz (79.5 kg)  Height: _0  (1.676 m)   Physical Exam Constitutional:      General: She is not in acute distress.    Comments: She is ambulating with a cane  HENT:     Head: Normocephalic and atraumatic.  Eyes:     Pupils: Pupils are equal, round, and reactive to light.  Neck:     Musculoskeletal: Normal range of motion.  Cardiovascular:     Rate and Rhythm: Normal rate and regular rhythm.     Heart sounds: Normal heart sounds.  Pulmonary:     Effort: Pulmonary effort is normal.     Breath sounds: Normal breath sounds.  Abdominal:     General: Bowel sounds are normal.     Palpations: Abdomen is soft.  Skin:    General: Skin is warm and dry.  Neurological:     Mental Status: She is alert and oriented to person, place, and time.      CMP Latest Ref Rng & Units 08/08/2018  Glucose 70 - 99 mg/dL 112(H)  BUN 8 - 23 mg/dL 25(H)  Creatinine 0.44 - 1.00 mg/dL 0.69  Sodium 135 - 145 mmol/L 140  Potassium 3.5 - 5.1 mmol/L 4.3  Chloride 98 - 111 mmol/L 108  CO2 22 - 32 mmol/L 26  Calcium 8.9 - 10.3 mg/dL 9.2  Total Protein 6.5 - 8.1 g/dL 6.4(L)  Total Bilirubin 0.3 - 1.2 mg/dL 0.5  Alkaline Phos 38 - 126 U/L 63  AST 15 - 41 U/L 22  ALT 0 - 44 U/L 19   CBC Latest Ref Rng & Units 08/08/2018  WBC 4.0 - 10.5 K/uL 5.2  Hemoglobin 12.0 - 15.0 g/dL 11.8(L)  Hematocrit 36.0 - 46.0 % 37.2  Platelets 150 - 400 K/uL 278     Assessment and plan- Patient is a 77 y.o. female with stage II ovarian cancer status post 4 cycles of adjuvant carboplatin and Taxol chemotherapy complicated by  neuropathy and possible atypical GBS secondary to flu shot  1.  Patient will no longer be receiving any further chemotherapy given her complications as above.  We will touch base with Dr. Bary Castilla to get her port taken out at this time.  With regards to her ovarian cancer I will get a repeat CT chest abdomen pelvis with contrast at this time.  I will see her back in 3 months time with CBC CMP and Ca1 25.  She has an appointment with GYN oncology in February 2020.  2.  Patient will also touch base with neurology to assess her ambulation and fitness for driving.   Visit Diagnosis 1. Malignant neoplasm of right ovary ([place])   2. Carcinoma of right ovary ([place])      Dr. Randa Evens, MD, MPH [facility] at [facility] [phone] 08/09/2018 11:23 AM

## 2018-08-09 NOTE — Telephone Encounter (Signed)
Called patient to let her know about the appointment for her port to be removed it will be on August 22, 2018 at 1115 at Dr. Barbette Hair office.  she will need to arrive 15 minutes early.  Since I did not get her on the phone I also gave her daughter a copy of that she will take home and give to her mother to make sure that she knows about the appointment

## 2018-08-12 DIAGNOSIS — T451X5D Adverse effect of antineoplastic and immunosuppressive drugs, subsequent encounter: Secondary | ICD-10-CM | POA: Diagnosis not present

## 2018-08-12 DIAGNOSIS — G620 Drug-induced polyneuropathy: Secondary | ICD-10-CM | POA: Diagnosis not present

## 2018-08-12 DIAGNOSIS — E876 Hypokalemia: Secondary | ICD-10-CM | POA: Diagnosis not present

## 2018-08-12 DIAGNOSIS — C561 Malignant neoplasm of right ovary: Secondary | ICD-10-CM | POA: Diagnosis not present

## 2018-08-12 DIAGNOSIS — J9601 Acute respiratory failure with hypoxia: Secondary | ICD-10-CM | POA: Diagnosis not present

## 2018-08-12 DIAGNOSIS — G610 Guillain-Barre syndrome: Secondary | ICD-10-CM | POA: Diagnosis not present

## 2018-08-15 ENCOUNTER — Ambulatory Visit (INDEPENDENT_AMBULATORY_CARE_PROVIDER_SITE_OTHER): Payer: Medicare Other | Admitting: Internal Medicine

## 2018-08-15 ENCOUNTER — Encounter

## 2018-08-15 ENCOUNTER — Encounter: Payer: Self-pay | Admitting: Internal Medicine

## 2018-08-15 VITALS — BP 120/70 | HR 100 | Temp 97.6°F | Ht 66.0 in | Wt 175.0 lb

## 2018-08-15 DIAGNOSIS — C569 Malignant neoplasm of unspecified ovary: Secondary | ICD-10-CM

## 2018-08-15 DIAGNOSIS — R109 Unspecified abdominal pain: Secondary | ICD-10-CM | POA: Diagnosis not present

## 2018-08-15 DIAGNOSIS — M25511 Pain in right shoulder: Secondary | ICD-10-CM | POA: Insufficient documentation

## 2018-08-15 DIAGNOSIS — T451X5D Adverse effect of antineoplastic and immunosuppressive drugs, subsequent encounter: Secondary | ICD-10-CM | POA: Diagnosis not present

## 2018-08-15 DIAGNOSIS — I890 Lymphedema, not elsewhere classified: Secondary | ICD-10-CM

## 2018-08-15 DIAGNOSIS — G610 Guillain-Barre syndrome: Secondary | ICD-10-CM | POA: Diagnosis not present

## 2018-08-15 DIAGNOSIS — J9601 Acute respiratory failure with hypoxia: Secondary | ICD-10-CM | POA: Diagnosis not present

## 2018-08-15 DIAGNOSIS — E876 Hypokalemia: Secondary | ICD-10-CM | POA: Diagnosis not present

## 2018-08-15 DIAGNOSIS — G629 Polyneuropathy, unspecified: Secondary | ICD-10-CM

## 2018-08-15 DIAGNOSIS — G620 Drug-induced polyneuropathy: Secondary | ICD-10-CM | POA: Diagnosis not present

## 2018-08-15 DIAGNOSIS — C561 Malignant neoplasm of right ovary: Secondary | ICD-10-CM | POA: Diagnosis not present

## 2018-08-15 NOTE — Patient Instructions (Addendum)
Rec MRI right shoulder for now and consider Dr. Roland Rack referral F/u in 3-4 months   07/17/18  INDICATION: Right shoulder pain.  COMPARISON: none  TECHNIQUE: Frontal, oblique and lateral views of the shoulder were obtained.   FINDINGS: There are no fractures or subluxations.  There are degenerative changes involving the right acromioclavicular joint. There is also prominent spurring off the inferior acromion. There is likely encroachment on the supraspinatus tendon.  There are degenerative changes involving the glenohumeral joint. There is spurring off the superolateral humeral head. There is calcification in the soft tissues adjacent to the proximal lateral humerus which may represent calcific bursitis   There is a central venous catheter with its tip projecting in the region of the superior vena cava. The included portions of the right lung are clear.   IMPRESSION:  1. Degenerative changes as described in the acromioclavicular and glenohumeral joint as well as separate spurring off the acromion and the humeral head 2. Likely encroachment on the supraspinatus tendon 3. 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

## 2018-08-15 NOTE — Progress Notes (Signed)
No chief complaint on file.  F/u with grandaughter today  1. 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 She wants to see neurology to see if she can drive again. Disc with pt this may require an OT evaluation outpatient  2. 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 C/o abdominal pain CT scan ab/pelvis 08/16/18 sch to further w/u with h/o ovarian cancer. Abdominal pain is generalized after during PT she reports a belt was used during PT exercises to help her walk  4. Ovarian cancer. She will no longer undergo chemotx due to notes from H/o  5. Right shoulder pain with ROM and limited ROM for a while per pt pain is 5/10 lifting overhead causes pain. Reviewed Xray 07/17/18 abnormal. She has not tried Voltaren gel  INDICATION: Right shoulder pain.  COMPARISON: none  TECHNIQUE: Frontal, oblique and lateral views of the shoulder were obtained.   FINDINGS: There are no fractures or subluxations.  There are degenerative changes involving the right acromioclavicular joint. There is also prominent spurring off the inferior acromion. There is likely encroachment on the supraspinatus tendon.  There are degenerative changes involving the glenohumeral joint. There is spurring off the superolateral humeral head. There is calcification in the soft tissues adjacent to the proximal lateral humerus which may represent calcific bursitis   There is a central venous catheter with its tip projecting in the region of the superior vena cava. The included portions of the right lung are clear.   IMPRESSION:  1. Degenerative changes as described in the acromioclavicular and glenohumeral joint as well as separate spurring off the acromion and the humeral head 2.  Likely encroachment on the supraspinatus tendon 3. Possible calcific bursitis  6. Leg edema on lasix 20 mg qd and compression stockings pt cant tell a difference   Review of Systems  Constitutional: Negative for weight loss.  HENT: Negative for hearing loss.   Eyes: Negative for blurred vision.  Respiratory: Negative for shortness of breath.   Gastrointestinal: Positive for abdominal pain.  Musculoskeletal: Positive for joint pain. Negative for falls.  Skin: Negative for rash.  Neurological: Positive for sensory change.  Psychiatric/Behavioral: Negative for depression and memory loss.   Past Medical History:  Diagnosis Date  . Cancer ([place]) 2019   b/l ovaries and left fallopian tube pelvic lymph nodes negative   . Hyperlipidemia   . Overactive bladder   . Thyroid disease   . UTI (urinary tract infection)   . Vitamin D deficiency    Past Surgical History:  Procedure Laterality Date  . ABDOMINAL HYSTERECTOMY     02/15/18   . CHOLECYSTECTOMY  2008  . LAPAROSCOPIC VAGINAL HYSTERECTOMY WITH SALPINGO OOPHORECTOMY  02/15/2018  . PORTACATH PLACEMENT Left 02/25/2018   Procedure: INSERTION PORT-A-CATH;  Surgeon: Robert Bellow, MD;  Location: [facility] ORS;  Service: General;  Laterality: Left;  . TOTAL ABDOMINAL HYSTERECTOMY W/ BILATERAL SALPINGOOPHORECTOMY  02/15/2018   lymph nodes removed   Family History  Problem Relation Age of Onset  . Sudden death Mother 49       drowning in hurricane Katrina  . Pancreatic cancer Father        deceased early 51s; smoker  . Alcohol abuse Father   .  COPD Father   . Coronary artery disease Brother   . Melanoma Sister        deceased at 32  . COPD Sister   . Prostate cancer Maternal Uncle        deceased 50  . Breast cancer Paternal Aunt        unsure of this dx   Social History   Socioeconomic History  . Marital status: Widowed    Spouse name: Not on file  . Number of children: Not on file  . Years of education: Not on file  .  Highest education level: Not on file  Occupational History  . Not on file  Social Needs  . Financial resource strain: Not on file  . Food insecurity:    Worry: Not on file    Inability: Not on file  . Transportation needs:    Medical: Not on file    Non-medical: Not on file  Tobacco Use  . Smoking status: Former Smoker    Packs/day: 1.00    Years: 25.00    Pack years: 25.00    Types: Cigarettes    Last attempt to quit: 02/26/1984    Years since quitting: 34.4  . Smokeless tobacco: Never Used  . Tobacco comment: age 42 to 72 1 ppd   Substance and Sexual Activity  . Alcohol use: Never    Frequency: Never  . Drug use: Never  . Sexual activity: Not Currently  Lifestyle  . Physical activity:    Days per week: Not on file    Minutes per session: Not on file  . Stress: Not on file  Relationships  . Social connections:    Talks on phone: Not on file    Gets together: Not on file    Attends religious service: Not on file    Active member of club or organization: Not on file    Attends meetings of clubs or organizations: Not on file    Relationship status: Not on file  . Intimate partner violence:    Fear of current or ex partner: Not on file    Emotionally abused: Not on file    Physically abused: Not on file    Forced sexual activity: Not on file  Other Topics Concern  . Not on file  Social History Narrative   Engineer, maintenance (IT), bachelors degree    Lives in [place] here visiting daughter [name]    Former smoker quit age 34 y.o    No outpatient medications have been marked as taking for the 08/15/18 encounter (Appointment) with McLean-Scocuzza, Nino Glow, MD.   Allergies  Allergen Reactions  . Crestor [Rosuvastatin]     Myopathy    . Epinephrine Other (See Comments)    Funny feeling, heart racing   . Influenza Vaccines     ?GBS 05/2018   . Lipitor [Atorvastatin Calcium]     Myalgias     Recent Results (from the past 2160 hour(s))  Comprehensive metabolic panel      Status: Abnormal   Collection Time: 05/23/18  3:13 PM  Result Value Ref Range   Sodium 140 135 - 145 mmol/L   Potassium 4.0 3.5 - 5.1 mmol/L   Chloride 108 98 - 111 mmol/L   CO2 26 22 - 32 mmol/L   Glucose, Bld 99 70 - 99 mg/dL   BUN 26 (H) 8 - 23 mg/dL   Creatinine, Ser 0.94 0.44 - 1.00 mg/dL   Calcium 9.2 8.9 - 10.3 mg/dL  Total Protein 6.3 (L) 6.5 - 8.1 g/dL   Albumin 3.8 3.5 - 5.0 g/dL   AST 21 15 - 41 U/L   ALT 18 0 - 44 U/L   Alkaline Phosphatase 80 38 - 126 U/L   Total Bilirubin 0.7 0.3 - 1.2 mg/dL   GFR calc non Af Amer 57 (L) >60 mL/min   GFR calc Af Amer >60 >60 mL/min    Comment: (NOTE) The eGFR has been calculated using the CKD EPI equation. This calculation has not been validated in all clinical situations. eGFR's persistently <60 mL/min signify possible Chronic Kidney Disease.    Anion gap 6 5 - 15    Comment: Performed at [facility] [address] [place], [place] [address]  CBC with Differential/Platelet     Status: Abnormal   Collection Time: 05/23/18  3:13 PM  Result Value Ref Range   WBC 7.3 3.6 - 11.0 K/uL   RBC 3.72 (L) 3.80 - 5.20 MIL/uL   Hemoglobin 11.6 (L) 12.0 - 16.0 g/dL   HCT 35.0 35.0 - 47.0 %   MCV 94.3 80.0 - 100.0 fL   MCH 31.3 26.0 - 34.0 pg   MCHC 33.2 32.0 - 36.0 g/dL   RDW 16.7 (H) 11.5 - 14.5 %   Platelets 222 150 - 440 K/uL   Neutrophils Relative % 70 %   Neutro Abs 5.1 1.4 - 6.5 K/uL   Lymphocytes Relative 18 %   Lymphs Abs 1.3 1.0 - 3.6 K/uL   Monocytes Relative 10 %   Monocytes Absolute 0.7 0.2 - 0.9 K/uL   Eosinophils Relative 1 %   Eosinophils Absolute 0.1 0 - 0.7 K/uL   Basophils Relative 1 %   Basophils Absolute 0.1 0 - 0.1 K/uL    Comment: Performed at [facility], [address] [place], [place] [address]  CBC with Differential/Platelet     Status: Abnormal   Collection Time: 05/28/18  9:04 AM  Result Value Ref Range   WBC 5.3 3.6 - 11.0 K/uL   RBC 3.67 (L) 3.80 - 5.20 MIL/uL   Hemoglobin 11.5  (L) 12.0 - 16.0 g/dL   HCT 34.4 (L) 35.0 - 47.0 %   MCV 93.7 80.0 - 100.0 fL   MCH 31.4 26.0 - 34.0 pg   MCHC 33.5 32.0 - 36.0 g/dL   RDW 16.7 (H) 11.5 - 14.5 %   Platelets 249 150 - 440 K/uL   Neutrophils Relative % 67 %   Neutro Abs 3.5 1.4 - 6.5 K/uL   Lymphocytes Relative 20 %   Lymphs Abs 1.1 1.0 - 3.6 K/uL   Monocytes Relative 10 %   Monocytes Absolute 0.5 0.2 - 0.9 K/uL   Eosinophils Relative 2 %   Eosinophils Absolute 0.1 0 - 0.7 K/uL   Basophils Relative 1 %   Basophils Absolute 0.1 0 - 0.1 K/uL    Comment: Performed at [facility], [address] [place], [place] [address]  Comprehensive metabolic panel     Status: Abnormal   Collection Time: 05/28/18  9:04 AM  Result Value Ref Range   Sodium 144 135 - 145 mmol/L   Potassium 3.6 3.5 - 5.1 mmol/L   Chloride 108 98 - 111 mmol/L   CO2 29 22 - 32 mmol/L   Glucose, Bld 111 (H) 70 - 99 mg/dL   BUN 20 8 - 23 mg/dL   Creatinine, Ser 0.58 0.44 - 1.00 mg/dL   Calcium 9.1 8.9 - 10.3  mg/dL   Total Protein 6.3 (L) 6.5 - 8.1 g/dL   Albumin 3.6 3.5 - 5.0 g/dL   AST 22 15 - 41 U/L   ALT 16 0 - 44 U/L   Alkaline Phosphatase 73 38 - 126 U/L   Total Bilirubin 0.5 0.3 - 1.2 mg/dL   GFR calc non Af Amer >60 >60 mL/min   GFR calc Af Amer >60 >60 mL/min    Comment: (NOTE) The eGFR has been calculated using the CKD EPI equation. This calculation has not been validated in all clinical situations. eGFR's persistently <60 mL/min signify possible Chronic Kidney Disease.    Anion gap 7 5 - 15    Comment: Performed at [facility], [address] [place], [place] [address]  CULTURE, URINE COMPREHENSIVE     Status: Abnormal   Collection Time: 06/03/18  4:06 PM  Result Value Ref Range   Urine Culture, Comprehensive Final report (A)    Organism ID, Bacteria Klebsiella pneumoniae (A)     Comment: Greater than 100,000 colony forming units per mL Cefazolin <=4 ug/mL Cefazolin with an MIC <=16 predicts susceptibility to the  oral agents cefaclor, cefdinir, cefpodoxime, cefprozil, cefuroxime, cephalexin, and loracarbef when used for therapy of uncomplicated urinary tract infections due to E. coli, Klebsiella pneumoniae, and Proteus mirabilis.    ANTIMICROBIAL SUSCEPTIBILITY Comment     Comment:       ** S = Susceptible; I = Intermediate; R = Resistant **                    P = Positive; N = Negative             MICS are expressed in micrograms per mL    Antibiotic                 RSLT#1    RSLT#2    RSLT#3    RSLT#4 Amoxicillin/Clavulanic Acid    S Ampicillin                     R Cefepime                       S Ceftriaxone                    S Cefuroxime                     S Ciprofloxacin                  S Ertapenem                      S Gentamicin                     S Imipenem                       S Levofloxacin                   S Meropenem                      S Nitrofurantoin                 I Piperacillin/Tazobactam        S Tetracycline  S Tobramycin                     S Trimethoprim/Sulfa             S   Comprehensive metabolic panel     Status: Abnormal   Collection Time: 08/08/18  8:59 AM  Result Value Ref Range   Sodium 140 135 - 145 mmol/L   Potassium 4.3 3.5 - 5.1 mmol/L   Chloride 108 98 - 111 mmol/L   CO2 26 22 - 32 mmol/L   Glucose, Bld 112 (H) 70 - 99 mg/dL   BUN 25 (H) 8 - 23 mg/dL   Creatinine, Ser 0.69 0.44 - 1.00 mg/dL   Calcium 9.2 8.9 - 10.3 mg/dL   Total Protein 6.4 (L) 6.5 - 8.1 g/dL   Albumin 3.7 3.5 - 5.0 g/dL   AST 22 15 - 41 U/L   ALT 19 0 - 44 U/L   Alkaline Phosphatase 63 38 - 126 U/L   Total Bilirubin 0.5 0.3 - 1.2 mg/dL   GFR calc non Af Amer >60 >60 mL/min   GFR calc Af Amer >60 >60 mL/min   Anion gap 6 5 - 15    Comment: Performed at [facility], [address] [place], [place] [address]  CBC with Differential/Platelet     Status: Abnormal   Collection Time: 08/08/18  8:59 AM  Result Value Ref Range   WBC 5.2 4.0 - 10.5  K/uL   RBC 3.75 (L) 3.87 - 5.11 MIL/uL   Hemoglobin 11.8 (L) 12.0 - 15.0 g/dL   HCT 37.2 36.0 - 46.0 %   MCV 99.2 80.0 - 100.0 fL   MCH 31.5 26.0 - 34.0 pg   MCHC 31.7 30.0 - 36.0 g/dL   RDW 12.4 11.5 - 15.5 %   Platelets 278 150 - 400 K/uL   nRBC 0.0 0.0 - 0.2 %   Neutrophils Relative % 57 %   Neutro Abs 3.0 1.7 - 7.7 K/uL   Lymphocytes Relative 22 %   Lymphs Abs 1.1 0.7 - 4.0 K/uL   Monocytes Relative 11 %   Monocytes Absolute 0.6 0.1 - 1.0 K/uL   Eosinophils Relative 8 %   Eosinophils Absolute 0.4 0.0 - 0.5 K/uL   Basophils Relative 2 %   Basophils Absolute 0.1 0.0 - 0.1 K/uL   Immature Granulocytes 0 %   Abs Immature Granulocytes 0.01 0.00 - 0.07 K/uL    Comment: Performed at [facility], [address] [place], [place] [address]   Objective  There is no height or weight on file to calculate BMI. Wt Readings from Last 3 Encounters:  08/08/18 175 lb 4.8 oz (79.5 kg)  06/03/18 184 lb (83.5 kg)  05/31/18 189 lb (85.7 kg)   Temp Readings from Last 3 Encounters:  08/08/18 97.9 F (36.6 C) (Tympanic)  05/31/18 98.2 F (36.8 C) (Oral)  05/28/18 97.7 F (36.5 C) (Tympanic)   BP Readings from Last 3 Encounters:  08/08/18 117/71  06/03/18 110/69  05/31/18 118/64   Pulse Readings from Last 3 Encounters:  08/08/18 100  06/03/18 (!) 111  05/31/18 87    Physical Exam Vitals signs and nursing note reviewed.  Constitutional:      Appearance: Normal appearance.  HENT:     Head: Normocephalic and atraumatic.     Nose: Nose normal.     Mouth/Throat:     Mouth: Mucous membranes are moist.     Pharynx:  Oropharynx is clear.  Eyes:     Conjunctiva/sclera: Conjunctivae normal.     Pupils: Pupils are equal, round, and reactive to light.  Cardiovascular:     Rate and Rhythm: Normal rate and regular rhythm.     Heart sounds: Normal heart sounds.     Comments: 1-2+ leg edema lymphedema  Compression hose on to thighs   Pulmonary:     Effort: Pulmonary effort is  normal.     Breath sounds: Normal breath sounds.  Musculoskeletal:     Right shoulder: She exhibits decreased range of motion and tenderness.  Skin:    General: Skin is warm and dry.  Neurological:     General: No focal deficit present.     Mental Status: She is alert and oriented to person, place, and time.     Gait: Gait normal.     Comments: 4-/5 strength left lower leg  4+/5 right lower ext  Sensation intact upper arms b/l and strength 5/5 upper arms b/l   Walking with cane today   Psychiatric:        Attention and Perception: Attention and perception normal.        Mood and Affect: Mood and affect normal.        Speech: Speech normal.        Behavior: Behavior normal. Behavior is cooperative.        Thought Content: Thought content normal.        Cognition and Memory: Cognition and memory normal.        Judgment: Judgment normal.     Assessment   1. GBS 2. Neuropathy ? Chemo induced vs GBS 3. Abdominal pain  4. Ovarian cancer  5. Right shoulder pain with arthritis, bursitis and c/w rotator cuff  6. Leg edema likely lymphedema  7. HM Plan   1. Refer to Dr. Melrose Nakayama for f/u appears d/c from rehab and doing better  2. Refer to Dr. Melrose Nakayama to assess ability to drive, may need OT outpt eval crestor stopped due to myopathy  She stopped cymbalta 30 mg qd  Consider machine for neuropathy   3. CT tomorrow  4. No further chemo for now per Dr. Janese Banks notes  5. MRI if abnormal refer to Dr. Roland Rack 6. Lasix 20 mg prn will not increase dose due to c/w side effects  Cont elevation and compression  7.  No further vaccines due to GBS flu shot had 05/31/18. She had had flu shot prev and no sx's.  Tdap per pt had 5 years ago get record pt to get record  Prevnar, pna 23, shingles vaccines  -pt to track down records.   Former smoker max 1 ppd from age 53 to 89 no FH lung cancer   Out of age window pap s/p total hysterectomy 02/15/18 for ovarian cancer with b/l ovaries and left fallopian  tube effected negative b/l lymph nodes 3 LN bx'ed   pending cologaurd today never had colonoscopy will do has it at the house   Mammogram per pt had 6 months ago in [place] requested records today   [facility] had in the past requested records prev   Provider: Dr. Olivia Mackie McLean-Scocuzza-Internal Medicine

## 2018-08-16 ENCOUNTER — Ambulatory Visit
Admission: RE | Admit: 2018-08-16 | Discharge: 2018-08-16 | Disposition: A | Discharge status: Home / Self Care | Payer: Medicare Other | Source: Ambulatory Visit | Attending: Oncology | Admitting: Oncology

## 2018-08-16 DIAGNOSIS — N281 Cyst of kidney, acquired: Secondary | ICD-10-CM | POA: Diagnosis not present

## 2018-08-16 DIAGNOSIS — E876 Hypokalemia: Secondary | ICD-10-CM | POA: Diagnosis not present

## 2018-08-16 DIAGNOSIS — C561 Malignant neoplasm of right ovary: Secondary | ICD-10-CM | POA: Diagnosis not present

## 2018-08-16 DIAGNOSIS — G610 Guillain-Barre syndrome: Secondary | ICD-10-CM | POA: Diagnosis not present

## 2018-08-16 DIAGNOSIS — G620 Drug-induced polyneuropathy: Secondary | ICD-10-CM | POA: Diagnosis not present

## 2018-08-16 DIAGNOSIS — R918 Other nonspecific abnormal finding of lung field: Secondary | ICD-10-CM | POA: Diagnosis not present

## 2018-08-16 DIAGNOSIS — Z8543 Personal history of malignant neoplasm of ovary: Secondary | ICD-10-CM | POA: Diagnosis not present

## 2018-08-16 DIAGNOSIS — C569 Malignant neoplasm of unspecified ovary: Secondary | ICD-10-CM | POA: Diagnosis not present

## 2018-08-16 DIAGNOSIS — T451X5D Adverse effect of antineoplastic and immunosuppressive drugs, subsequent encounter: Secondary | ICD-10-CM | POA: Diagnosis not present

## 2018-08-16 DIAGNOSIS — J9601 Acute respiratory failure with hypoxia: Secondary | ICD-10-CM | POA: Diagnosis not present

## 2018-08-16 MED ORDER — IOPAMIDOL (ISOVUE-300) INJECTION 61%
100.0000 mL | Freq: Once | INTRAVENOUS | Status: AC | PRN
  Administered 2018-08-16: 100 mL via INTRAVENOUS

## 2018-08-20 DIAGNOSIS — E876 Hypokalemia: Secondary | ICD-10-CM | POA: Diagnosis not present

## 2018-08-20 DIAGNOSIS — G610 Guillain-Barre syndrome: Secondary | ICD-10-CM | POA: Diagnosis not present

## 2018-08-20 DIAGNOSIS — C561 Malignant neoplasm of right ovary: Secondary | ICD-10-CM | POA: Diagnosis not present

## 2018-08-20 DIAGNOSIS — G620 Drug-induced polyneuropathy: Secondary | ICD-10-CM | POA: Diagnosis not present

## 2018-08-20 DIAGNOSIS — J9601 Acute respiratory failure with hypoxia: Secondary | ICD-10-CM | POA: Diagnosis not present

## 2018-08-20 DIAGNOSIS — T451X5D Adverse effect of antineoplastic and immunosuppressive drugs, subsequent encounter: Secondary | ICD-10-CM | POA: Diagnosis not present

## 2018-08-22 ENCOUNTER — Ambulatory Visit: Payer: Medicare Other | Admitting: General Surgery

## 2018-08-22 DIAGNOSIS — T451X5D Adverse effect of antineoplastic and immunosuppressive drugs, subsequent encounter: Secondary | ICD-10-CM | POA: Diagnosis not present

## 2018-08-22 DIAGNOSIS — J9601 Acute respiratory failure with hypoxia: Secondary | ICD-10-CM | POA: Diagnosis not present

## 2018-08-22 DIAGNOSIS — E876 Hypokalemia: Secondary | ICD-10-CM | POA: Diagnosis not present

## 2018-08-22 DIAGNOSIS — G620 Drug-induced polyneuropathy: Secondary | ICD-10-CM | POA: Diagnosis not present

## 2018-08-22 DIAGNOSIS — G610 Guillain-Barre syndrome: Secondary | ICD-10-CM | POA: Diagnosis not present

## 2018-08-22 DIAGNOSIS — C561 Malignant neoplasm of right ovary: Secondary | ICD-10-CM | POA: Diagnosis not present

## 2018-08-30 ENCOUNTER — Other Ambulatory Visit: Payer: Self-pay | Admitting: Internal Medicine

## 2018-08-30 DIAGNOSIS — E039 Hypothyroidism, unspecified: Secondary | ICD-10-CM

## 2018-08-30 NOTE — Telephone Encounter (Signed)
Requested medication (s) are due for refill today: Yes  Requested medication (s) are on the active medication list: Yes  Last refill:  Potassium 08/02/18; Vitamin D 05/08/18  Future visit scheduled: No  Notes to clinic:  Unable to refill, refilled last by another provider     Requested Prescriptions  Pending Prescriptions Disp Refills   potassium chloride SA (K-DUR,KLOR-CON) 20 MEQ tablet 30 tablet 11    Sig: Take 1 tablet (20 mEq total) by mouth daily.     Endocrinology:  Minerals - Potassium Supplementation Passed - 08/30/2018  5:56 PM      Passed - K in normal range and within 360 days    Potassium  Date Value Ref Range Status  08/08/2018 4.3 3.5 - 5.1 mmol/L Final         Passed - Cr in normal range and within 360 days    Creatinine, Ser  Date Value Ref Range Status  08/08/2018 0.69 0.44 - 1.00 mg/dL Final         Passed - Valid encounter within last 12 months    Recent Outpatient Visits          2 weeks ago Guillain Barr syndrome [facility])   [place] McLean-Scocuzza, Nino Glow, MD   3 months ago Leg edema   [place] McLean-Scocuzza, Nino Glow, MD      Future Appointments            In 9 months O'Brien-Blaney, Denisa L, LPN [facility] [place], [place]   In 9 months McLean-Scocuzza, Nino Glow, MD [place], [facility]          Vitamin D, Ergocalciferol, (DRISDOL) 1.25 MG (50000 UT) CAPS capsule 4 capsule 0    Sig: Take 1 capsule (50,000 Units total) by mouth every Wednesday.     Endocrinology:  Vitamins - Vitamin D Supplementation Failed - 08/30/2018  5:56 PM      Failed - 50,000 IU strengths are not delegated      Failed - Phosphate in normal range and within 360 days    No results found for: PHOS       Failed - Vit D in normal range and within 360 days    Vit D, 25-Hydroxy  Date Value Ref Range Status  04/02/2018 24.8 (L) 30.0 - 100.0 ng/mL Final    Comment:    (NOTE) Vitamin D deficiency has  been defined by the Institute of Medicine and an Endocrine Society practice guideline as a level of serum 25-OH vitamin D less than 20 ng/mL (1,2). The Endocrine Society went on to further define vitamin D insufficiency as a level between 21 and 29 ng/mL (2). 1. IOM (Institute of Medicine). 2010. Dietary reference   intakes for calcium and D. [place]: The   [employer]. 2. Holick MF, Binkley [place], Bischoff-Ferrari HA, et al.   Evaluation, treatment, and prevention of vitamin D   deficiency: an Endocrine Society clinical practice   guideline. JCEM. 2011 Jul; 96(7):1911-30. Performed At: [facility] [place], [place] [id number] Rush Farmer MD NW:[id number]          [place] in normal range and within 360 days    Calcium  Date Value Ref Range Status  08/08/2018 9.2 8.9 - 10.3 mg/dL Final         Passed - Valid encounter within last 12 months    Recent Outpatient Visits  2 weeks ago Guillain Barr syndrome [facility])   [place] McLean-Scocuzza, Nino Glow, MD   3 months ago Leg edema   [place] McLean-Scocuzza, Nino Glow, MD      Future Appointments            In 9 months O'Brien-Blaney, Bryson Corona, LPN [place], [place]   In 9 months McLean-Scocuzza, Nino Glow, MD [facility], [place]

## 2018-08-30 NOTE — Telephone Encounter (Signed)
Patient called and she says the pharmacy doesn't have her prescriptions and [facility] [place] so she can get them in [place] where she is located. I advised [facility]3 refills. I advised I will check on that prescription at the pharmacy, but the other two will need to be sent to Dr. Terese Door for review and it could take up to 3 days. She says please let it be Monday, because I just got out of the hospital and didn't realized I didn't have those medicines.  [facility] and [facility] to [place], [facility] who says the Synthroid is available for pickup.

## 2018-08-30 NOTE — Telephone Encounter (Signed)
Copied from [place] [id number]. Topic: Quick Communication - Rx Refill/Question >> Aug 30, 2018  5:24 PM Percell Belt A wrote: Medication: SYNTHROID 175 MCG tablet [[id number]]  pVitamin D, Ergocalciferol, (DRISDOL) 50000 units CAPS capsule [[id number]] otassium chloride SA (K-DUR,KLOR-CON) 20 MEQ tablet [[id number]]    Has the patient contacted their pharmacy? no (Agent: If no, request that the patient contact the pharmacy for the refill.) (Agent: If yes, when and what did the pharmacy advise?)  Preferred Pharmacy (with phone number or street name): [id number] [address], [place] [phone] (Phone)   Agent: Please be advised that RX refills may take up to 3 business days. We ask that you follow-up with your pharmacy.

## 2018-09-02 NOTE — Telephone Encounter (Signed)
Advise patient vitamin D in October 2019 was 28 normal is 30.  -She does not have to be on the high dose weekly vitamin D and can take otc 5000 IU D3 daily   Also if she is not taking the fluid pill lasix low dose, she does not have to take potassium  -mail food list of potassium rich foods her last potassium was 4.3 normal   [place]

## 2018-09-02 NOTE — Telephone Encounter (Signed)
Call pt   Advise patient vitamin D in October 2019 was 28 normal is 30.  -She does not have to be on the high dose weekly vitamin D and can take otc 5000 IU D3 daily   Also if she is not taking the fluid pill lasix low dose, she does not have to take potassium  -mail food list of potassium rich foods her last potassium was 4.3 normal   Also 1 year supply of her thyroid medication was sent in 05/2018 to the pharmacy so they should have it   [place]

## 2018-09-03 ENCOUNTER — Telehealth: Payer: Self-pay | Admitting: Internal Medicine

## 2018-09-03 NOTE — Telephone Encounter (Signed)
Copied from [place] [id number]. Topic: Quick Communication - Rx Refill/Question >> Sep 03, 2018  4:46 PM Windy Kalata wrote: Medication: celecoxib (CELEBREX) 100 MG   Has the patient contacted their pharmacy? Yes.   (Agent: If no, request that the patient contact the pharmacy for the refill.) (Agent: If yes, when and what did the pharmacy advise?)  Preferred Pharmacy (with phone number or street name):   Agent: Please be advised that RX refills may take up to 3 business days. We ask that you follow-up with your pharmacy.

## 2018-09-03 NOTE — Telephone Encounter (Signed)
Copied from [place] [id number]. Topic: Quick Communication - Rx Refill/Question >> Sep 03, 2018  4:46 PM Windy Kalata wrote: Medication: celecoxib (CELEBREX) 100 MG   Has the patient contacted their pharmacy? Yes.   (Agent: If no, request that the patient contact the pharmacy for the refill.) (Agent: If yes, when and what did the pharmacy advise?)  Preferred Pharmacy (with phone number or street name):     [id number] [address], [place] [phone] (Phone) [phone] (Fax)      Agent: Please be advised that RX refills may take up to 3 business days. We ask that you follow-up with your pharmacy.

## 2018-09-03 NOTE — Telephone Encounter (Signed)
Patient called about the Celebrex request, she asked what is it used for, I advised what it's used for. She says that she was on it in the hospital for [facility]. She asked if Dr. Terese Door reorder it.

## 2018-09-04 ENCOUNTER — Other Ambulatory Visit: Payer: Self-pay | Admitting: Internal Medicine

## 2018-09-04 DIAGNOSIS — M1990 Unspecified osteoarthritis, unspecified site: Secondary | ICD-10-CM

## 2018-09-04 MED ORDER — CELECOXIB 100 MG PO CAPS: 100.0000 mg | ORAL_CAPSULE | Freq: Two times a day (BID) | ORAL | Status: DC

## 2018-09-04 NOTE — Telephone Encounter (Signed)
Call pt  celebrex is an anti-inflammatory medication we use for arthritis  -Does she still think she needs this medication?  -Discuss with daughter and let me know   [place]

## 2018-09-05 ENCOUNTER — Telehealth: Payer: Self-pay

## 2018-09-05 NOTE — Telephone Encounter (Signed)
Patient stated she doesn't know and wants the doctor to call her daughter. Not a nurse.

## 2018-09-05 NOTE — Telephone Encounter (Signed)
Copied from [place] [id number]. Topic: General - Other >> Sep 05, 2018  4:20 PM Melissa Anderson wrote:  Pt said she was not able to get her cologuard test in her luggage when she left [place] and is asking if the test can be sent to her address in [place] and

## 2018-09-06 DIAGNOSIS — G610 Guillain-Barre syndrome: Secondary | ICD-10-CM | POA: Diagnosis not present

## 2018-09-09 NOTE — Telephone Encounter (Signed)
Still was not able to talk to her daughter call pt and get daughters cell #   [place]

## 2018-09-10 ENCOUNTER — Ambulatory Visit: Payer: Medicare Other

## 2018-09-16 ENCOUNTER — Telehealth: Payer: Self-pay | Admitting: General Surgery

## 2018-09-16 NOTE — Telephone Encounter (Signed)
Patient to call back after she talks with her daughter to schedule port removal

## 2018-09-16 NOTE — Telephone Encounter (Signed)
Patient said she will be back in town Feb. 11th-16th and is wanting to schedule with Dr. Bary Castilla when he returns and hops its around the time she will be back into town. Patient said she would speak to her daughter in regards about the other providers to where she may see one of them, but would like it down when she comes into town.

## 2018-09-25 DIAGNOSIS — G610 Guillain-Barre syndrome: Secondary | ICD-10-CM | POA: Diagnosis not present

## 2018-09-26 ENCOUNTER — Telehealth: Payer: Self-pay

## 2018-09-26 NOTE — Telephone Encounter (Signed)
Ordered MRI right shoulder  Does she have metal in her body?  MRI is best    [place]

## 2018-09-26 NOTE — Telephone Encounter (Signed)
Copied from [place] [id number]. Topic: General - Inquiry >> Sep 26, 2018 10:12 AM Vernona Rieger wrote: Reason for CRM: pt thought Dr Aundra Dubin ordered a CT of her shoulder but when she called to scheduled they advised her it was an MRI that was ordered. She would like to know which one does she need to do? Please Advise.

## 2018-10-02 ENCOUNTER — Ambulatory Visit: Payer: Medicare Other

## 2018-10-03 DIAGNOSIS — G610 Guillain-Barre syndrome: Secondary | ICD-10-CM | POA: Diagnosis not present

## 2018-10-07 DIAGNOSIS — G610 Guillain-Barre syndrome: Secondary | ICD-10-CM | POA: Diagnosis not present

## 2018-10-08 DIAGNOSIS — G610 Guillain-Barre syndrome: Secondary | ICD-10-CM | POA: Diagnosis not present

## 2018-10-09 ENCOUNTER — Ambulatory Visit: Payer: Medicare Other

## 2018-10-09 ENCOUNTER — Other Ambulatory Visit: Payer: Self-pay

## 2018-10-09 ENCOUNTER — Inpatient Hospital Stay: Payer: Medicare Other | Attending: Obstetrics and Gynecology | Admitting: Obstetrics and Gynecology

## 2018-10-09 VITALS — BP 123/78 | HR 98 | Temp 96.5°F | Resp 18 | Wt 174.0 lb

## 2018-10-09 DIAGNOSIS — Z87891 Personal history of nicotine dependence: Secondary | ICD-10-CM | POA: Insufficient documentation

## 2018-10-09 DIAGNOSIS — T451X5A Adverse effect of antineoplastic and immunosuppressive drugs, initial encounter: Secondary | ICD-10-CM | POA: Diagnosis not present

## 2018-10-09 DIAGNOSIS — Z9221 Personal history of antineoplastic chemotherapy: Secondary | ICD-10-CM | POA: Diagnosis not present

## 2018-10-09 DIAGNOSIS — Z90710 Acquired absence of both cervix and uterus: Secondary | ICD-10-CM | POA: Diagnosis not present

## 2018-10-09 DIAGNOSIS — G620 Drug-induced polyneuropathy: Secondary | ICD-10-CM | POA: Diagnosis not present

## 2018-10-09 DIAGNOSIS — R251 Tremor, unspecified: Secondary | ICD-10-CM | POA: Diagnosis not present

## 2018-10-09 DIAGNOSIS — C561 Malignant neoplasm of right ovary: Secondary | ICD-10-CM | POA: Diagnosis not present

## 2018-10-09 DIAGNOSIS — R600 Localized edema: Secondary | ICD-10-CM | POA: Diagnosis not present

## 2018-10-09 DIAGNOSIS — C569 Malignant neoplasm of unspecified ovary: Secondary | ICD-10-CM | POA: Insufficient documentation

## 2018-10-09 DIAGNOSIS — Z90722 Acquired absence of ovaries, bilateral: Secondary | ICD-10-CM | POA: Insufficient documentation

## 2018-10-09 DIAGNOSIS — G610 Guillain-Barre syndrome: Secondary | ICD-10-CM | POA: Diagnosis not present

## 2018-10-09 NOTE — Progress Notes (Signed)
Gynecologic Oncology Interval Visit   Referring Provider: Dr Mike Gip  Chief Complaint: stage II ovarian cancer  Subjective:  Melissa Anderson is a 78 y.o. 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   Cycle 1 of chemotherapy was given on 03/21/2018.  Patient received cycle 4 of chemotherapy on 05/28/2018. After 4 cycles of chemotherapy patient developed progressive weakness in her bilateral lower extremities and was admitted to the hospital in [place] where she developed worsening respiratory failure and had to be intubated was airlifted to [place].  Patient was eventually extubated.  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  She was given IVIG as well.  Of note she has received flu shot consistently in the past and has never had these complications ever. Patient was then sent to a neuro rehab unit and was eventually discharged home  08/16/2018 - CT C/A/P IMPRESSION: 1. Status post hysterectomy and bilateral salpingo oophorectomy. No findings for metastatic ovarian cancer. No peritoneal surface or omental disease or abdominal/pelvic lymphadenopathy. 2. Mild chronic scarring changes in the lungs but no findings worrisome for pulmonary metastatic disease.  Today, she says overall she feels well. She continues to have bilateral lower extremity. She was previously taking lasix but has stopped as she felt it didn't help significantly. She uses a cane for ambulation. She is living alone in [place] currently.   Gynecologic Oncology History:  She was at her PCP office in March and was complaining of intermittent weakness and pain shooting down her right leg. Her PCP ordered an MRI of her lumbosacral spine which demonstrated this right adnexal mass as an incidental finding.   Radiologic  Imaging: 11/12/2017 Lumbosacral spine MRI scan noted a multicystic 4 x 5.5 cm right adnexal mass with fluid blood levels. Prominent uterine endometrial stripe.   12/20/2017 Pelvic Ultrasound: Uterus measures 9.7 x 4.1 x 5.9cm and is retroverted. Endometrial stripe measures 9 mm. Within it, as outlines by a small amount of fluid, there is a 1.4 x 0.7 x 1.4 cm polypoid echogenic structure with internal blood flow and small cystic components. A similar appearing but smaller 5 x 4 x 3 mm echogenic nodular density is noted within the endometrial cavity as well. The uterus is otherwise normal in appearance. There is a multiloculated complex cystic structure in the right adnexa measuring 6.3 x 6.1 x 4.7 cm. This has solid appearing components as well as anechoic portions, separated by thick septations. On MRI, many of these loculations demonstrate fluid-filled levels. No normal ovarian parenchyma is identified. The left ovary was not seen. There is no pelvic free fluid.  She subsequently underwent an EMB with her gynecologist and this was negative.   She apparently was not aware that she had a cyst this size until she was seen by her provider who recommended Gyn Oncology consultation for further evaluation. She was seen by Dr. Carlton Adam who recommended surgery and further imaging and tumor markers.   02/05/2018: CA 125 = 18, CA 19-9 = 8, CEA <0.5, OVA1 = 4 (low risk)  02/09/2018 CT Scan A/P: multi-septated complex cystic lesion noted in the right pelvis.adnexa immediately superior to the uterus which measures approximately 6.9 x 5.3 am in greatest axial dimensions and approximately 5.4 cm in greatest cranial caudal dimensions.  Abnormal thickening of the endometrial cavity which measures 1.3 cm  in greatest width.  Multiple peripelvic cysts are identified in bilateral renal hilar regions measuring up to 3.6 cm on the right and 2.9 cm on the left.  On 02/15/18, she underwent TLH, BSO, with bilateral pelvic LN  dissection, peritoneal biopsies, and omental biopsy with Dr. Unknown Foley at [facility]. No spread beyond tube and ovaries.  She was discharged on POD1 w/o complication.    Pathology A. 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  See the synoptic report. Note: The tumor is positive for PAX8, WT1, and p53 (strong, uniform). Based on the presence of tubal intraepithelial carcinoma, this is considered a tubal primary.  B.  Endometrium, curettage: Fragments of endometrial polyp.  C. Uterus, total hysterectomy:   Uterus:    Endometrium: Endometrial polyp with focal glandular crowding, see note.    Myometrium: Adenomyosis, leiomyomata (up to 1 cm).    Cervix: No pathologic diagnosis.     Serosa: No pathologic diagnosis.  Note: The areas of endometrial glandular crowding are not cytologically distinct from the background and therefore do not meet the criteria for endometrial intraepithelial neoplasia (EIN).  D. Right pelvic lymph nodes, lymphadenectomy: Three lymph nodes, negative for malignancy (0/3).   E. Left pelvic lymph nodes, lymphadenectomy:   Three lymph nodes, negative for malignancy (0/3).   F. Omentum, omentectomy:  Benign omentum.  G. Peritoneal biopsy #1:  Benign fibromuscular tissue.  H. Peritoneal biopsy #2: Benign fibrous tissue.  I. Peritoneal biopsy #3:  Benign fibroadipose tissue.  J. Peritoneal biopsy #4:  Benign fibroadipose tissue.  A. Pelvic Washings: - Negative. No Evidence of Malignancy  She was referred to Dr. Janese Banks, medical-oncology at [facility] and initiated cycle 1 of adjuvant carbo-Taxol on 03/21/18.   Genetic Testing:  - Invitae 83 gene Multi-Cancer panel was negative for pathogenic mutation in any genes. No VUS detected.   Problem List: Patient Active Problem List    Diagnosis Date Noted  . Acute pain of right shoulder 08/15/2018  . Guillain Barr syndrome ([place]) 06/26/2018  . Vitamin D deficiency 05/08/2018  . Neuropathy 05/08/2018  . Overactive bladder 05/08/2018  . Bilateral leg edema 05/08/2018  . HLD (hyperlipidemia) 05/08/2018  . Goals of care, counseling/discussion 02/25/2018  . Ovarian cancer ([place]) 02/22/2018  . Graves' disease 02/13/2018  . Pelvic mass in female 02/13/2018  . Thickened endometrium 02/13/2018    Past Medical History: Past Medical History:  Diagnosis Date  . Arthritis    right shoulder  . Cancer ([place]) 2019   b/l ovaries and left fallopian tube pelvic lymph nodes negative   . Guillain Barr syndrome ([place])    2/2 flu shot 05/2017 intubated x 8 days  . Hyperlipidemia   . Overactive bladder   . Thyroid disease   . UTI (urinary tract infection)   . Vitamin D deficiency     Past Surgical History: Past Surgical History:  Procedure Laterality Date  . ABDOMINAL HYSTERECTOMY     02/15/18   . CHOLECYSTECTOMY  2008  . LAPAROSCOPIC VAGINAL HYSTERECTOMY WITH SALPINGO OOPHORECTOMY  02/15/2018  . PORTACATH PLACEMENT Left 02/25/2018   Procedure: INSERTION PORT-A-CATH;  Surgeon: Robert Bellow, MD;  Location: [facility] ORS;  Service: General;  Laterality: Left;  . TOTAL ABDOMINAL HYSTERECTOMY W/ BILATERAL SALPINGOOPHORECTOMY  02/15/2018   lymph nodes removed    OB History:  OB History  Gravida Para Term  Preterm AB Living  2         2  SAB TAB Ectopic Multiple Live Births               # Outcome Date GA Lbr Len/2nd Weight Sex Delivery Anes PTL Lv  2 Gravida           1 Gravida             Obstetric Comments  Menstrual age: 68    Age 1st Pregnancy: 59    Family History: Family History  Problem Relation Age of Onset  . Sudden death Mother 78       drowning in hurricane Katrina  . Pancreatic cancer Father        deceased early 21s; smoker  . Alcohol abuse Father   . COPD Father   . Coronary artery disease  Brother   . Melanoma Sister        deceased at 83  . COPD Sister   . Prostate cancer Maternal Uncle        deceased 4  . Breast cancer Paternal Aunt        unsure of this dx    Social History: Social History   Socioeconomic History  . Marital status: Widowed    Spouse name: Not on file  . Number of children: Not on file  . Years of education: Not on file  . Highest education level: Not on file  Occupational History  . Not on file  Social Needs  . Financial resource strain: Not on file  . Food insecurity:    Worry: Not on file    Inability: Not on file  . Transportation needs:    Medical: Not on file    Non-medical: Not on file  Tobacco Use  . Smoking status: Former Smoker    Packs/day: 1.00    Years: 25.00    Pack years: 25.00    Types: Cigarettes    Last attempt to quit: 02/26/1984    Years since quitting: 34.6  . Smokeless tobacco: Never Used  . Tobacco comment: age 58 to 54 1 ppd   Substance and Sexual Activity  . Alcohol use: Never    Frequency: Never  . Drug use: Never  . Sexual activity: Not Currently  Lifestyle  . Physical activity:    Days per week: Not on file    Minutes per session: Not on file  . Stress: Not on file  Relationships  . Social connections:    Talks on phone: Not on file    Gets together: Not on file    Attends religious service: Not on file    Active member of club or organization: Not on file    Attends meetings of clubs or organizations: Not on file    Relationship status: Not on file  . Intimate partner violence:    Fear of current or ex partner: Not on file    Emotionally abused: Not on file    Physically abused: Not on file    Forced sexual activity: Not on file  Other Topics Concern  . Not on file  Social History Narrative   Engineer, maintenance (IT), bachelors degree    Lives in [place] here visiting daughter Melissa Anderson    Former smoker quit age 61 y.o     Allergies: Allergies  Allergen Reactions  . Crestor [Rosuvastatin]      Myopathy    . Epinephrine Other (See Comments)    Funny feeling, heart  racing   . Influenza Vaccines     ?GBS 05/2018   . Lipitor [Atorvastatin Calcium]     Myalgias      Current Medications: Current Outpatient Medications  Medication Sig Dispense Refill  . aspirin EC 81 MG tablet Take 81 mg by mouth daily.    . diclofenac sodium (VOLTAREN) 1 % GEL Apply topically.    . gabapentin (NEURONTIN) 300 MG capsule Take 300 mg by mouth 3 (three) times daily.     . mirabegron ER (MYRBETRIQ) 50 MG TB24 tablet Take 1 tablet (50 mg total) by mouth daily. 30 tablet 11  . OVER THE COUNTER MEDICATION Obagi facial cleansing otc system - use as directed    . SYNTHROID 175 MCG tablet Take 1 tablet (175 mcg total) by mouth daily. 90 tablet 3  . celecoxib (CELEBREX) 100 MG capsule Take 1 capsule (100 mg total) by mouth 2 (two) times daily. (Patient not taking: Reported on 10/09/2018)    . Cholecalciferol (D3-50) 1.25 MG (50000 UT) capsule Take by mouth.    . furosemide (LASIX) 20 MG tablet Take 1 tablet (20 mg total) by mouth daily as needed. In am (Patient not taking: Reported on 10/09/2018) 30 tablet 2  . LORazepam (ATIVAN) 0.5 MG tablet Take 1 tablet (0.5 mg total) by mouth at bedtime. (Patient not taking: Reported on 10/09/2018) 30 tablet 0  . potassium chloride SA (K-DUR,KLOR-CON) 20 MEQ tablet Take 20 mEq by mouth daily.     . prochlorperazine (COMPAZINE) 10 MG tablet Take 1 tablet (10 mg total) by mouth every 6 (six) hours as needed (Nausea or vomiting). (Patient not taking: Reported on 10/09/2018) 30 tablet 1  . VESICARE 5 MG tablet Take 5 mg by mouth every evening.     . Vitamin D, Ergocalciferol, (DRISDOL) 50000 units CAPS capsule TAKE 1 CAPSULE (50,000 UNITS TOTAL) BY MOUTH EVERY WEDNESDAY. (Patient not taking: Reported on 10/09/2018) 4 capsule 0   No current facility-administered medications for this visit.    Review of Systems General:  no complaints Skin: no complaints Eyes: no  complaints HEENT: no complaints Breasts: no complaints Pulmonary: no complaints Cardiac: no complaints Gastrointestinal: no complaints Genitourinary/Sexual: no complaints Ob/Gyn: no complaints Musculoskeletal: no complaints Hematology: no complaints Neurologic/Psych: no complaints   Objective:  Physical Examination:  BP 123/78 (BP Location: Left Arm, Patient Position: Sitting)   Pulse 98   Temp (!) 96.5 F (35.8 C) (Tympanic)   Resp 18   Wt 174 lb (78.9 kg)   BMI 28.08 kg/m     ECOG Performance Status: 1 - Symptomatic but completely ambulatory  GENERAL: Patient is a well appearing female in no acute distress HEENT:  Sclera clear. Anicteric NODES:  Negative axillary, supraclavicular, inguinal lymph node survery LUNGS:  Clear to auscultation bilaterally.   HEART:  Regular rate and rhythm.  ABDOMEN:  Soft, nontender.  No hernias, incisions well healed. No masses or ascites EXTREMITIES:  2-3+ BLE below the knee SKIN:  Clear with no obvious rashes or skin changes.  NEURO:  Nonfocal. Well oriented.  Appropriate affect.  Pelvic: Exam Chaperoned by RN EGBUS: no lesions, Vagina: no lesions, no discharge or bleeding. Vaginal Cuff healed and normal. Uterus/cervix: surgically absent. BME; negative for masses/nodularity.  Rectovaginal: deferred.      Assessment:  Melissa Anderson is a 78 y.o. 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 Unknown Foley.  She has initiated chemotherapy with carbo-Taxol and is treated by Dr. Janese Banks at [facility]. Chemotherapy discontinued due to GBS from flu shot.   Symptomatic bilateral lymphedema, possibly secondary to surgery.   She has completed germline genetic testing which was negative for mutation.   Medical co-morbidities complicating care: BMI of 29.41 Plan:   Problem List Items Addressed This Visit      Endocrine    Ovarian cancer [facility]) - Primary     She is very interested in discussing risks of recurrence. She is worried because she had to stop her chemotherapy sooner than anticipated due to GBS. We reviewed that recurrence risk may range from 25-30% based on the literature. We reviewed surveillance options. Typically imaging is only performed as clinically indicated, but in her case CA125 is not a marker. Therefore we discussed obtaining a CT scan every 6 months for the first two years and then reassessing the interval. We will also perform assessments with physical exam including pelvic exam every 3-4 months for 3 years, then 6 months until five years, then annually thereafter.   Somatic testing was not recommended at this time because she does not have a BRCA mutation and is not a candidate for maintenance PARP inhibitor after completing primary chemotherapy.  Somatic testing for mutation and HRD can be considered should she have recurrence.   Referral to PT for lymphedema. She would like to see someone here at [facility] for management.   =Lauren Candise Che, AGNP-C [place] [facility] [place] [place] (work cell) [phone] (office)  I personally had a face to face interaction and evaluated the patient jointly with the NP, Ms. Beckey Rutter.  I have reviewed her history and available records and have performed the key portions of the physical exam including General, HEENT, supraclavicular lymph node survey, abdominal exam, pelvic exam with my findings confirming those documented above by the APP.  I have discussed the case with the APP and the patient.  I agree with the above documentation, assessment and plan which was fully formulated by me.  Counseling was completed by me.   I personally saw the patient and performed a substantive portion of this encounter in conjunction with the listed APP as documented above.  A total of 25 minutes were spent with the patient/family today; >50% was spent in  education, counseling and coordination of care for ovarian cancer.  Hasheem Voland Gaetana Michaelis, MD   CC:  Dr. Janese Banks

## 2018-10-09 NOTE — Progress Notes (Signed)
Here for follow up. Saw Dr Melrose Nakayama this am. Having port removed tomorrow / Dr Laverle Hobby home to [facility] per pt.

## 2018-10-09 NOTE — Patient Instructions (Signed)
We can consider referral to lymphedema clinic for evaluation and treatment of your leg swelling. You can also consider compression garments available through companies such as [employer] ([place], [place]). If you are interested in lymphedema clinic evaluation, we could assist with referral to services in [place] if you prefer. We have scheduled CT scan in June for surveillance and will plan to see you following for results. Please let me know if you have concerns or questions. Thank you for allowing [place] to participate in your care.

## 2018-10-10 ENCOUNTER — Other Ambulatory Visit: Payer: Self-pay

## 2018-10-10 ENCOUNTER — Telehealth: Payer: Self-pay | Admitting: Nurse Practitioner

## 2018-10-10 ENCOUNTER — Telehealth: Payer: Self-pay | Admitting: Oncology

## 2018-10-10 ENCOUNTER — Ambulatory Visit (INDEPENDENT_AMBULATORY_CARE_PROVIDER_SITE_OTHER): Payer: Medicare Other | Admitting: General Surgery

## 2018-10-10 ENCOUNTER — Encounter: Payer: Self-pay | Admitting: General Surgery

## 2018-10-10 VITALS — BP 100/58 | HR 99 | Resp 16 | Ht 66.0 in | Wt 173.0 lb

## 2018-10-10 DIAGNOSIS — C561 Malignant neoplasm of right ovary: Secondary | ICD-10-CM

## 2018-10-10 LAB — CA 125: Cancer Antigen (CA) 125: 16.9 U/mL (ref 0.0–38.1)

## 2018-10-10 NOTE — Telephone Encounter (Signed)
Called patient to share results of CA 125. No answer. Will release via mychart.

## 2018-10-10 NOTE — Progress Notes (Signed)
Patient ID: Melissa Anderson, female   DOB: [birth year], 78 y.o.   MRN: [id number]  Chief Complaint  Patient presents with  . Procedure    port removal    HPI Melissa Anderson is a 78 y.o. female.  Here for port removal.  The patient developed Elfredia Nevins after her October chemotherapy treatments and the decision has been made not to not administer any additional treatment.  The patient reports that she has not had a colonoscopy recommended to her in the past.  HPI  Past Medical History:  Diagnosis Date  . Arthritis    right shoulder  . Cancer ([place]) 2019   b/l ovaries and left fallopian tube pelvic lymph nodes negative   . Guillain Barr syndrome ([place])    2/2 flu shot 05/2018,  intubated x 8 days  . Hyperlipidemia   . Overactive bladder   . Thyroid disease   . UTI (urinary tract infection)   . Vitamin D deficiency     Past Surgical History:  Procedure Laterality Date  . ABDOMINAL HYSTERECTOMY     02/15/18   . CHOLECYSTECTOMY  2008  . LAPAROSCOPIC VAGINAL HYSTERECTOMY WITH SALPINGO OOPHORECTOMY  02/15/2018  . PORTACATH PLACEMENT Left 02/25/2018   Procedure: INSERTION PORT-A-CATH;  Surgeon: Robert Bellow, MD;  Location: [facility] ORS;  Service: General;  Laterality: Left;  . TOTAL ABDOMINAL HYSTERECTOMY W/ BILATERAL SALPINGOOPHORECTOMY  02/15/2018   lymph nodes removed    Family History  Problem Relation Age of Onset  . Sudden death Mother 63       drowning in hurricane Katrina  . Pancreatic cancer Father        deceased early 67s; smoker  . Alcohol abuse Father   . COPD Father   . Coronary artery disease Brother   . Melanoma Sister        deceased at 81  . COPD Sister   . Prostate cancer Maternal Uncle        deceased 35  . Breast cancer Paternal Aunt        unsure of this dx    Social History Social History   Tobacco Use  . Smoking status: Former Smoker    Packs/day: 1.00    Years: 25.00    Pack years: 25.00    Types: Cigarettes    Last attempt to  quit: 02/26/1984    Years since quitting: 34.6  . Smokeless tobacco: Never Used  . Tobacco comment: age 47 to 71 1 ppd   Substance Use Topics  . Alcohol use: Never    Frequency: Never  . Drug use: Never    Allergies  Allergen Reactions  . Crestor [Rosuvastatin]     Myopathy    . Epinephrine Other (See Comments)    Funny feeling, heart racing   . Influenza Vaccines     ?GBS 05/2018   . Lipitor [Atorvastatin Calcium]     Myalgias      Current Outpatient Medications  Medication Sig Dispense Refill  . aspirin EC 81 MG tablet Take 81 mg by mouth daily.    . diclofenac sodium (VOLTAREN) 1 % GEL Apply topically.    . gabapentin (NEURONTIN) 300 MG capsule Take 300 mg by mouth 3 (three) times daily.     . mirabegron ER (MYRBETRIQ) 50 MG TB24 tablet Take 1 tablet (50 mg total) by mouth daily. 30 tablet 11  . OVER THE COUNTER MEDICATION Obagi facial cleansing otc system - use as directed    .  SYNTHROID 175 MCG tablet Take 1 tablet (175 mcg total) by mouth daily. 90 tablet 3  . Vitamin D, Ergocalciferol, (DRISDOL) 50000 units CAPS capsule TAKE 1 CAPSULE (50,000 UNITS TOTAL) BY MOUTH EVERY WEDNESDAY. 4 capsule 0   No current facility-administered medications for this visit.     Review of Systems Review of Systems  Constitutional: Negative.   Respiratory: Negative.   Cardiovascular: Negative.     Blood pressure (!) 100/58, pulse 99, resp. rate 16, height 5\' 6"  (1.676 m), weight 173 lb (78.5 kg), SpO2 95 %.  Physical Exam Physical Exam Exam conducted with a chaperone present.  Constitutional:      Appearance: Normal appearance.  Chest:    Skin:    General: Skin is warm and dry.  Neurological:     Mental Status: She is alert and oriented to person, place, and time.  Psychiatric:        Mood and Affect: Mood normal.     Data Reviewed Request for port removal reviewed.  The chest was prepped with alcohol followed by 10 cc of 0.5% Xylocaine with 0.25% Marcaine with  1-200,000's of epinephrine.  The area was cleansed with a second application of ChloraPrep and draped.  The original incision was opened.  The port was extracted with a transfixion sutures of Prolene.  The catheter was removed without incident.  The tip was intact.  No bleeding was noted.  The catheter pocket was closed with a running 3-0 Vicryl suture to the adipose layer.  The skin was then closed with a running 4-0 Vicryl subcuticular suture.  Benzoin and Steri-Strip followed by Telfa and Tegaderm dressing applied.  Assessment    The patient tolerated port removal well.  Potential candidate for colon screen.  She is outside the normal screening age but has never had a test administered.    Plan    She might be a candidate for Cologuard testing and if positive then a colonoscopy.  The patient was instructed in postoperative wound care.  She will contact the office if she has any concerns.  HPI, assessment, plan and physical exam has been scribed under the direction and in the presence of Robert Bellow, MD. Karie Fetch, RN  I have completed the exam and reviewed the above documentation for accuracy and completeness.  I agree with the above.  Haematologist has been used and any errors in dictation or transcription are unintentional.  Hervey Ard, M.D., F.A.C.S.   Forest Gleason Joffrey Kerce 10/10/2018, 9:16 PM

## 2018-10-10 NOTE — Telephone Encounter (Signed)
Patient called to cancel 11/07/18 Lab + MD appointment and does not wish to rschd at this time.

## 2018-10-10 NOTE — Patient Instructions (Signed)
May shower May remove dressing in 2-3 days Steri strips will gradually come off over 2-3 weeks May use an Ice pack as needed for comfort  

## 2018-10-11 ENCOUNTER — Ambulatory Visit: Payer: Medicare Other

## 2018-10-11 DIAGNOSIS — R350 Frequency of micturition: Secondary | ICD-10-CM | POA: Diagnosis not present

## 2018-10-11 DIAGNOSIS — N3941 Urge incontinence: Secondary | ICD-10-CM | POA: Diagnosis not present

## 2018-10-11 DIAGNOSIS — R351 Nocturia: Secondary | ICD-10-CM | POA: Diagnosis not present

## 2018-10-14 ENCOUNTER — Telehealth: Payer: Self-pay | Admitting: *Deleted

## 2018-10-14 DIAGNOSIS — G610 Guillain-Barre syndrome: Secondary | ICD-10-CM | POA: Diagnosis not present

## 2018-10-14 NOTE — Telephone Encounter (Signed)
Patient called and stated that she had her port removed on Thursday and the bandage is still on, She wants to know if she needs to remove it and does she need to put anything cream on it. Please call and advise

## 2018-10-14 NOTE — Telephone Encounter (Signed)
Patient notified she may remove her bandage now. She may shower, wash the area and pat dry. She only needs a dressing if she still has any drainage. She is aware.

## 2018-10-16 ENCOUNTER — Ambulatory Visit: Payer: Medicare Other

## 2018-10-16 DIAGNOSIS — G610 Guillain-Barre syndrome: Secondary | ICD-10-CM | POA: Diagnosis not present

## 2018-10-18 DIAGNOSIS — G610 Guillain-Barre syndrome: Secondary | ICD-10-CM | POA: Diagnosis not present

## 2018-10-21 DIAGNOSIS — G610 Guillain-Barre syndrome: Secondary | ICD-10-CM | POA: Diagnosis not present

## 2018-10-23 DIAGNOSIS — G610 Guillain-Barre syndrome: Secondary | ICD-10-CM | POA: Diagnosis not present

## 2018-10-25 DIAGNOSIS — G610 Guillain-Barre syndrome: Secondary | ICD-10-CM | POA: Diagnosis not present

## 2018-10-28 DIAGNOSIS — G610 Guillain-Barre syndrome: Secondary | ICD-10-CM | POA: Diagnosis not present

## 2018-10-30 DIAGNOSIS — N6011 Diffuse cystic mastopathy of right breast: Secondary | ICD-10-CM | POA: Diagnosis not present

## 2018-10-30 DIAGNOSIS — G610 Guillain-Barre syndrome: Secondary | ICD-10-CM | POA: Diagnosis not present

## 2018-10-30 DIAGNOSIS — N6041 Mammary duct ectasia of right breast: Secondary | ICD-10-CM | POA: Diagnosis not present

## 2018-11-01 DIAGNOSIS — H43813 Vitreous degeneration, bilateral: Secondary | ICD-10-CM | POA: Diagnosis not present

## 2018-11-01 DIAGNOSIS — H2513 Age-related nuclear cataract, bilateral: Secondary | ICD-10-CM | POA: Diagnosis not present

## 2018-11-07 ENCOUNTER — Other Ambulatory Visit: Payer: Medicare Other

## 2018-11-07 ENCOUNTER — Ambulatory Visit: Payer: Medicare Other | Admitting: Oncology

## 2018-11-29 DIAGNOSIS — G610 Guillain-Barre syndrome: Secondary | ICD-10-CM | POA: Diagnosis not present

## 2019-01-08 ENCOUNTER — Ambulatory Visit: Payer: Medicare Other

## 2019-01-16 DIAGNOSIS — T451X5A Adverse effect of antineoplastic and immunosuppressive drugs, initial encounter: Secondary | ICD-10-CM | POA: Diagnosis not present

## 2019-01-16 DIAGNOSIS — G620 Drug-induced polyneuropathy: Secondary | ICD-10-CM | POA: Diagnosis not present

## 2019-01-27 ENCOUNTER — Telehealth: Payer: Self-pay

## 2019-01-27 NOTE — Telephone Encounter (Signed)
Spoke with patient over telephone to inform her SCP is completed and I would like to mail it to her and call her next Monday 02/03/2019 at 1:30 pm to review SCP and treatment summary and the APP will also talk to her during this time.  Patient in agreement to this visit Virtually as patient lives in [place].

## 2019-01-31 ENCOUNTER — Telehealth: Payer: Self-pay | Admitting: Nurse Practitioner

## 2019-02-03 ENCOUNTER — Inpatient Hospital Stay: Payer: Medicare Other | Attending: Nurse Practitioner | Admitting: Nurse Practitioner

## 2019-02-03 NOTE — Progress Notes (Signed)
Note entered in error

## 2019-02-06 DIAGNOSIS — Z1211 Encounter for screening for malignant neoplasm of colon: Secondary | ICD-10-CM | POA: Diagnosis not present

## 2019-02-06 DIAGNOSIS — Z1212 Encounter for screening for malignant neoplasm of rectum: Secondary | ICD-10-CM | POA: Diagnosis not present

## 2019-02-06 LAB — COLOGUARD: Cologuard: NEGATIVE

## 2019-02-10 ENCOUNTER — Telehealth: Payer: Self-pay | Admitting: Oncology

## 2019-02-11 ENCOUNTER — Inpatient Hospital Stay: Payer: Medicare Other | Admitting: Oncology

## 2019-02-11 ENCOUNTER — Telehealth: Payer: Self-pay | Admitting: Nurse Practitioner

## 2019-02-11 ENCOUNTER — Ambulatory Visit: Admission: RE | Admit: 2019-02-11 | Payer: Medicare Other | Source: Ambulatory Visit

## 2019-02-11 ENCOUNTER — Other Ambulatory Visit: Payer: Self-pay

## 2019-02-11 NOTE — Telephone Encounter (Signed)
Called patient to follow up on scheduled ct scans and appt with gyn-onc clinic. She is in [place] currently and is not planning to travel at this time due to COVID-19. We discussed recommended surveillance schedule for her cancer type including exam with pelvic every 3-4 months. Last exam was 09/2018. She will discuss recommendations vs covid concerns with her family and let me know what she would like to do. I have canceled scans that were scheduled for today and gyn-onc appt. Will touch base with Dr. Theora Gianotti about her recommendations about follow up/surveillance.

## 2019-02-12 ENCOUNTER — Inpatient Hospital Stay: Payer: Medicare Other

## 2019-02-12 ENCOUNTER — Telehealth: Payer: Self-pay | Admitting: Nurse Practitioner

## 2019-02-12 NOTE — Telephone Encounter (Signed)
Returned patient's call. She is not planning on traveling at this time. I discussed surveillance options for her given her hesitancy to travel during covid-19 pandemic including Dr. Blake Divine recommendation to coordinate CT scans in [place]. She does not have a doctor in [place] but will call me once she has scheduled appointment so that we can coordinate care. If her plans change and she plans to come in [place], she will notify either me or [facility] so that appointment can be scheduled.

## 2019-02-13 ENCOUNTER — Telehealth: Payer: Self-pay | Admitting: Internal Medicine

## 2019-02-13 ENCOUNTER — Encounter: Payer: Self-pay | Admitting: Internal Medicine

## 2019-02-13 NOTE — Telephone Encounter (Signed)
See cologaurd results 02/06/2019

## 2019-02-13 NOTE — Telephone Encounter (Signed)
cologuard 02/06/2019 negative will repeat in 3 years

## 2019-02-14 NOTE — Telephone Encounter (Signed)
mychart sent to patient to inform him

## 2019-03-17 DIAGNOSIS — G610 Guillain-Barre syndrome: Secondary | ICD-10-CM | POA: Diagnosis not present

## 2019-03-17 DIAGNOSIS — T451X5A Adverse effect of antineoplastic and immunosuppressive drugs, initial encounter: Secondary | ICD-10-CM | POA: Diagnosis not present

## 2019-03-17 DIAGNOSIS — G620 Drug-induced polyneuropathy: Secondary | ICD-10-CM | POA: Diagnosis not present

## 2019-04-30 DIAGNOSIS — T451X5A Adverse effect of antineoplastic and immunosuppressive drugs, initial encounter: Secondary | ICD-10-CM | POA: Diagnosis not present

## 2019-04-30 DIAGNOSIS — Z8669 Personal history of other diseases of the nervous system and sense organs: Secondary | ICD-10-CM | POA: Diagnosis not present

## 2019-04-30 DIAGNOSIS — G620 Drug-induced polyneuropathy: Secondary | ICD-10-CM | POA: Diagnosis not present

## 2019-05-16 ENCOUNTER — Telehealth: Payer: Self-pay

## 2019-05-16 NOTE — Telephone Encounter (Signed)
Melissa Anderson returned call. She is unable to access her my chart. Arranged [facility] televisit for her 9/30 with Dr. Theora Gianotti. She has not had any recent lab work.

## 2019-05-16 NOTE — Telephone Encounter (Signed)
Voicemail left with Ms. Mohs to return call to arrange virtual visit with Dr. Theora Gianotti for follow up. She is currently in [place] and is continuing not to travel.

## 2019-05-28 ENCOUNTER — Inpatient Hospital Stay: Payer: Medicare Other

## 2019-06-02 DIAGNOSIS — T451X5A Adverse effect of antineoplastic and immunosuppressive drugs, initial encounter: Secondary | ICD-10-CM | POA: Diagnosis not present

## 2019-06-02 DIAGNOSIS — G620 Drug-induced polyneuropathy: Secondary | ICD-10-CM | POA: Diagnosis not present

## 2019-06-02 DIAGNOSIS — Z8543 Personal history of malignant neoplasm of ovary: Secondary | ICD-10-CM | POA: Diagnosis not present

## 2019-06-05 ENCOUNTER — Ambulatory Visit: Payer: Medicare Other | Admitting: Internal Medicine

## 2019-06-05 ENCOUNTER — Ambulatory Visit: Payer: Medicare Other

## 2019-06-18 IMAGING — DX DG CHEST 1V PORT
1 series · 1 of 1 positions shown · non-contrast
Comparison: None in PACs

CLINICAL DATA: Status post Port-A-Cath appliance placement.

EXAM:
PORTABLE CHEST 1 VIEW

[chest ap]
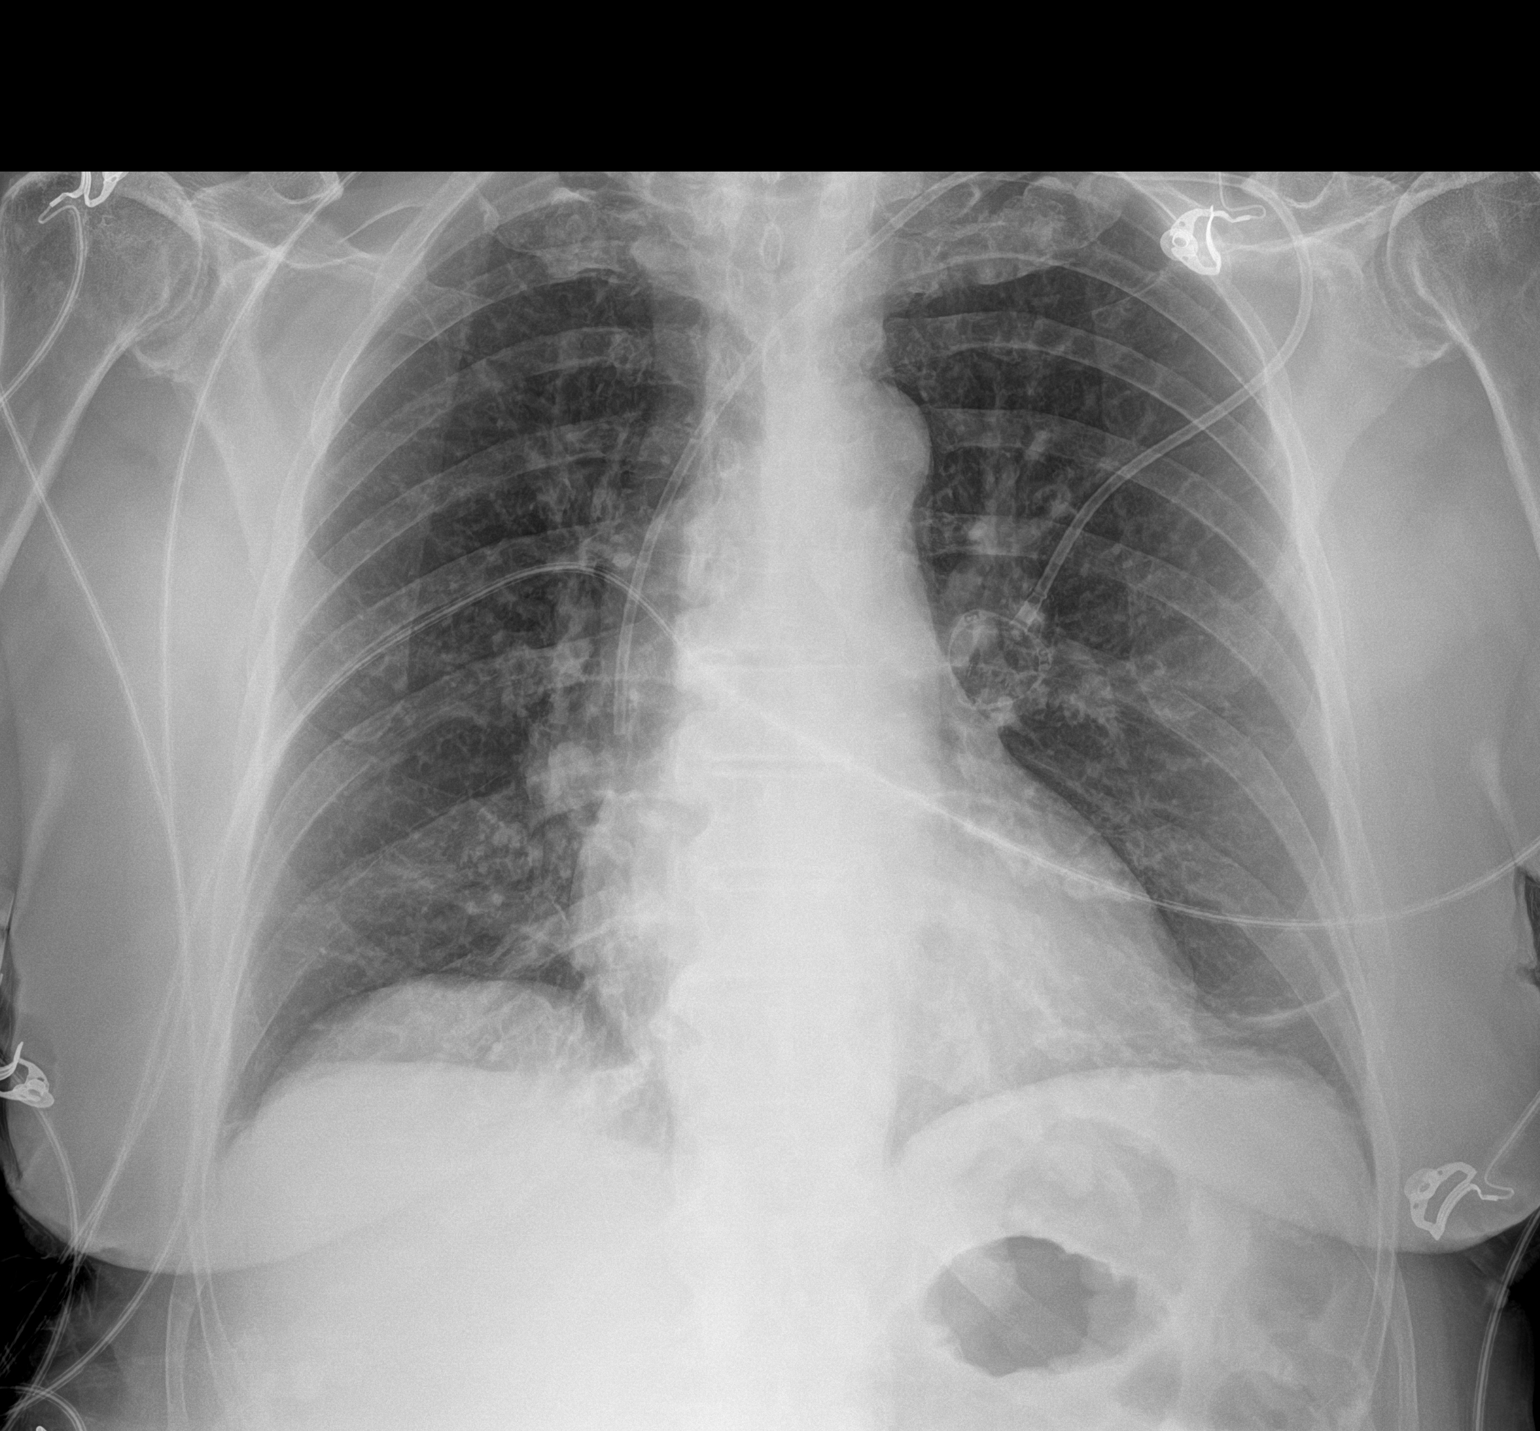

[1 of 1 positions shown; findings below may reference images not displayed]

FINDINGS: The patient has undergone porta catheter placement via the left
subclavian approach with the tip projecting at the junction of the
middle and distal thirds of the SVC. The lungs are well-expanded.
There is no pneumothorax or pleural effusion. Minimal subsegmental
atelectasis or scarring at the left base is present. The heart and
pulmonary vascularity are normal. The bony thorax is unremarkable.
IMPRESSION: No postprocedure complication following porta catheter placement.

## 2019-07-01 DIAGNOSIS — C5700 Malignant neoplasm of unspecified fallopian tube: Secondary | ICD-10-CM | POA: Diagnosis not present

## 2019-07-04 DIAGNOSIS — M2042 Other hammer toe(s) (acquired), left foot: Secondary | ICD-10-CM | POA: Diagnosis not present

## 2019-07-14 DIAGNOSIS — Z1231 Encounter for screening mammogram for malignant neoplasm of breast: Secondary | ICD-10-CM | POA: Diagnosis not present

## 2019-08-05 DIAGNOSIS — Z780 Asymptomatic menopausal state: Secondary | ICD-10-CM | POA: Diagnosis not present

## 2019-08-05 DIAGNOSIS — R32 Unspecified urinary incontinence: Secondary | ICD-10-CM | POA: Diagnosis not present

## 2019-08-05 DIAGNOSIS — E559 Vitamin D deficiency, unspecified: Secondary | ICD-10-CM | POA: Diagnosis not present

## 2019-08-05 DIAGNOSIS — Z13820 Encounter for screening for osteoporosis: Secondary | ICD-10-CM | POA: Diagnosis not present

## 2019-08-05 DIAGNOSIS — C5700 Malignant neoplasm of unspecified fallopian tube: Secondary | ICD-10-CM | POA: Diagnosis not present

## 2019-08-05 DIAGNOSIS — Z0000 Encounter for general adult medical examination without abnormal findings: Secondary | ICD-10-CM | POA: Diagnosis not present

## 2019-08-05 DIAGNOSIS — E039 Hypothyroidism, unspecified: Secondary | ICD-10-CM | POA: Diagnosis not present

## 2019-08-05 DIAGNOSIS — G6289 Other specified polyneuropathies: Secondary | ICD-10-CM | POA: Diagnosis not present

## 2019-08-06 DIAGNOSIS — C5700 Malignant neoplasm of unspecified fallopian tube: Secondary | ICD-10-CM | POA: Diagnosis not present

## 2019-08-06 DIAGNOSIS — G6289 Other specified polyneuropathies: Secondary | ICD-10-CM | POA: Diagnosis not present

## 2019-08-06 DIAGNOSIS — Z1231 Encounter for screening mammogram for malignant neoplasm of breast: Secondary | ICD-10-CM | POA: Diagnosis not present

## 2019-08-06 DIAGNOSIS — N6019 Diffuse cystic mastopathy of unspecified breast: Secondary | ICD-10-CM | POA: Diagnosis not present

## 2019-08-06 DIAGNOSIS — N6031 Fibrosclerosis of right breast: Secondary | ICD-10-CM | POA: Diagnosis not present

## 2019-08-06 DIAGNOSIS — E039 Hypothyroidism, unspecified: Secondary | ICD-10-CM | POA: Diagnosis not present

## 2019-08-06 DIAGNOSIS — E559 Vitamin D deficiency, unspecified: Secondary | ICD-10-CM | POA: Diagnosis not present

## 2019-08-06 DIAGNOSIS — R32 Unspecified urinary incontinence: Secondary | ICD-10-CM | POA: Diagnosis not present

## 2019-08-27 DIAGNOSIS — G620 Drug-induced polyneuropathy: Secondary | ICD-10-CM | POA: Diagnosis not present

## 2019-08-27 DIAGNOSIS — Z8669 Personal history of other diseases of the nervous system and sense organs: Secondary | ICD-10-CM | POA: Diagnosis not present

## 2019-08-27 DIAGNOSIS — T451X5A Adverse effect of antineoplastic and immunosuppressive drugs, initial encounter: Secondary | ICD-10-CM | POA: Diagnosis not present

## 2019-08-31 IMAGING — US US EXTREM LOW VENOUS BILAT
1 series · 13 of 24 positions shown · non-contrast
Comparison: None.

CLINICAL DATA: Bilateral lower extremity edema. Elevated D-dimer.
History of ovarian cancer. Evaluate for DVT.



[Series 1: us extrem low venous bilat · 13 of 76 slices shown]
[im 1/76]
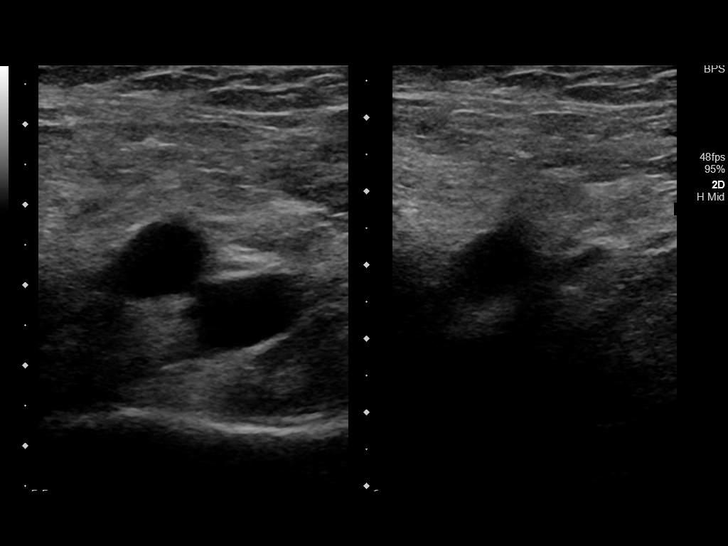
[im 7/76]
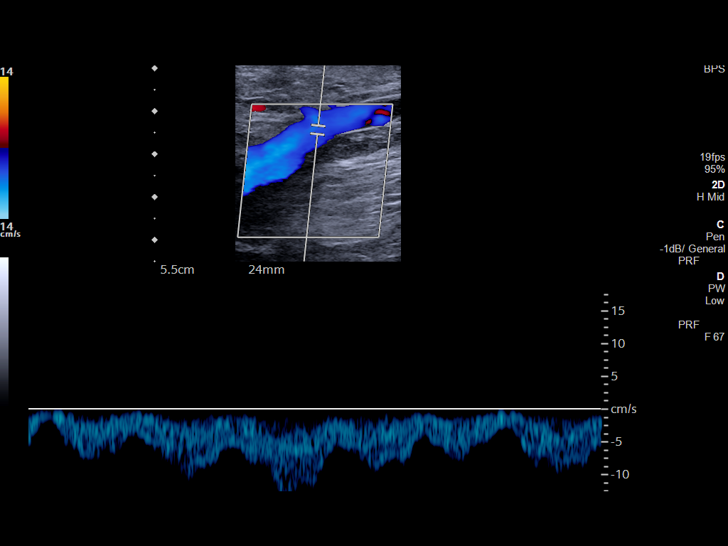
[im 14/76]
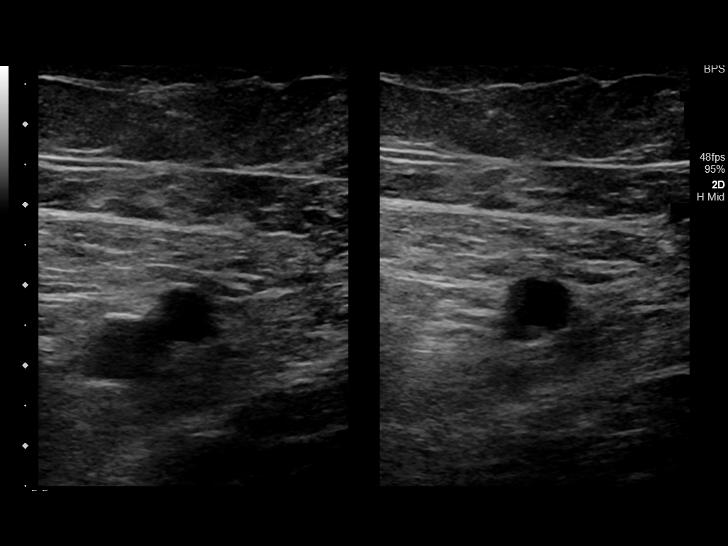
[im 20/76]
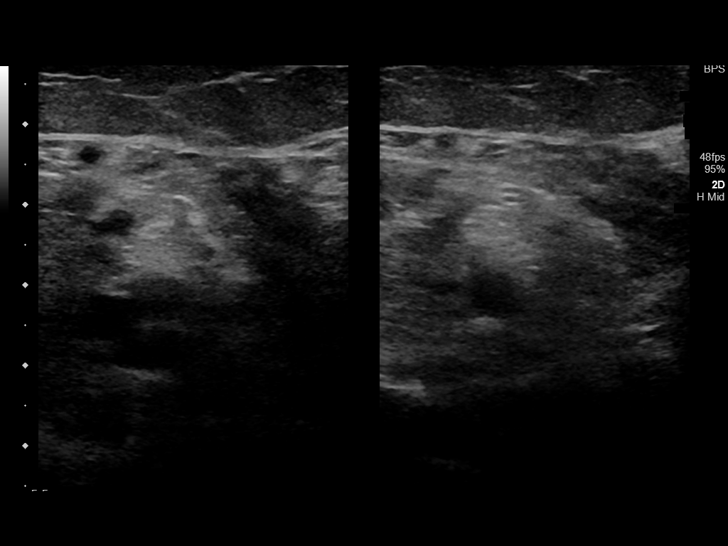
[im 27/76]
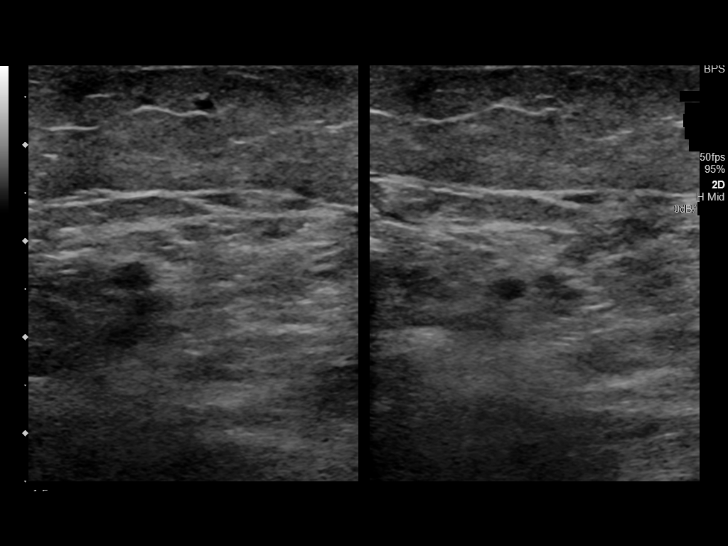
[im 33/76]
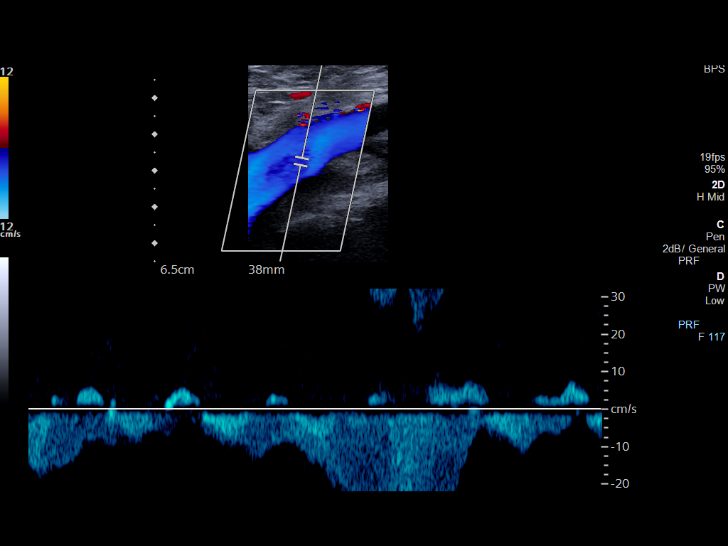
[im 40/76]
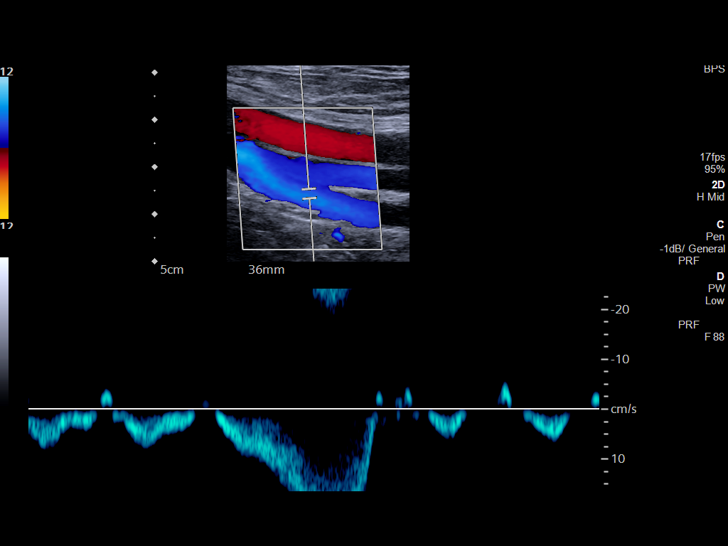
[im 43/76]
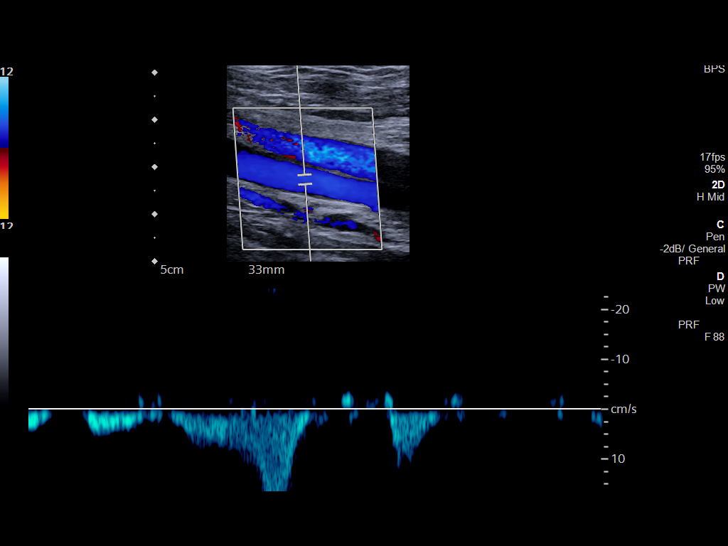
[im 49/76]
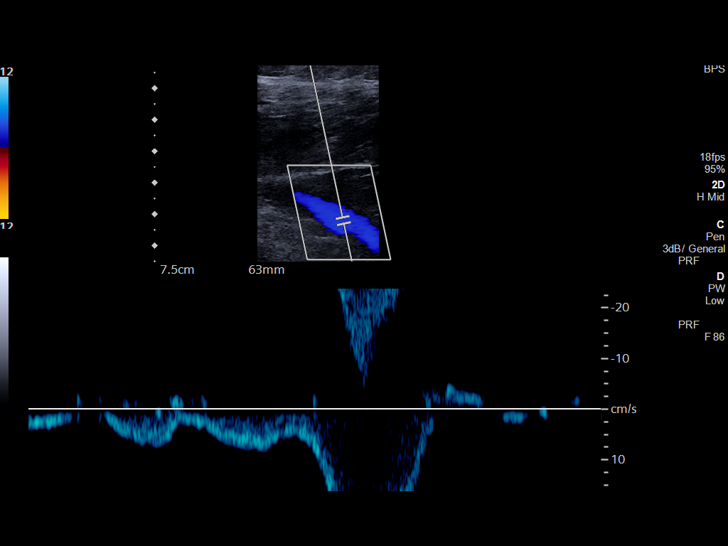
[im 56/76]
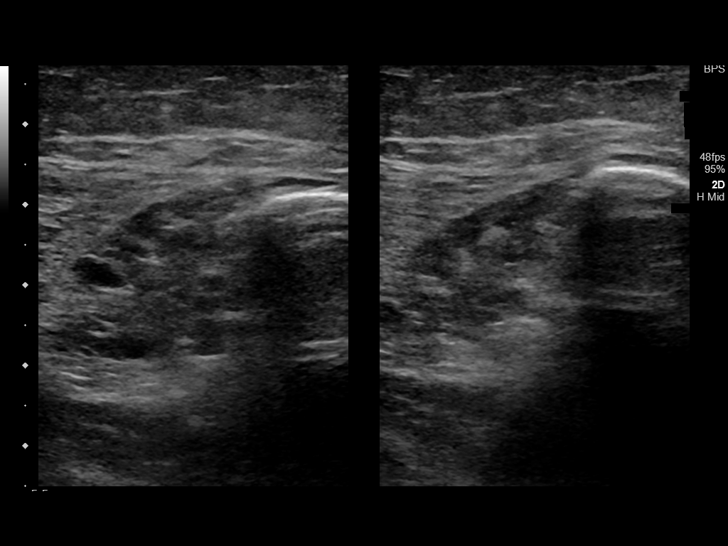
[im 62/76]
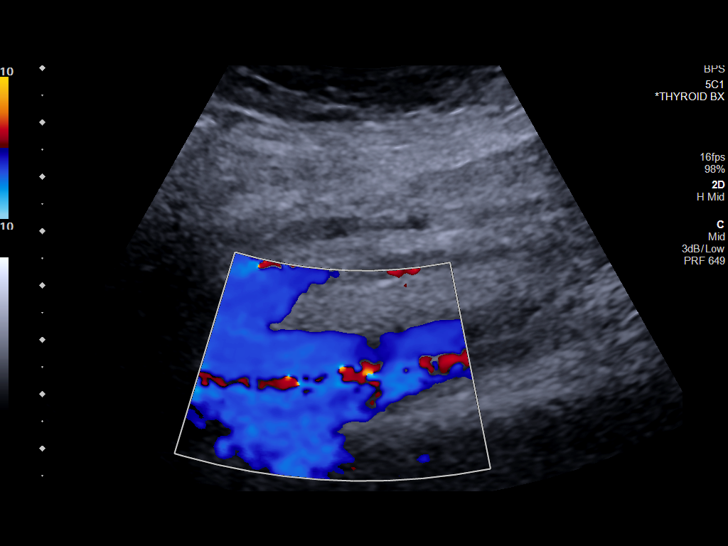
[im 69/76]
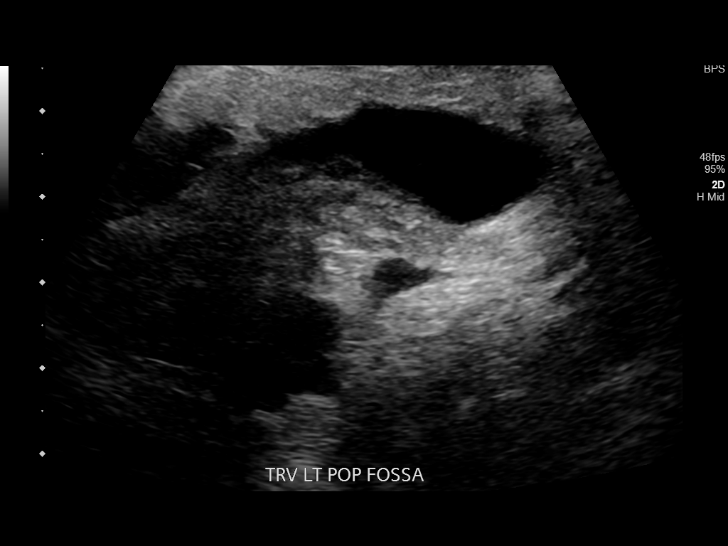
[im 76/76]
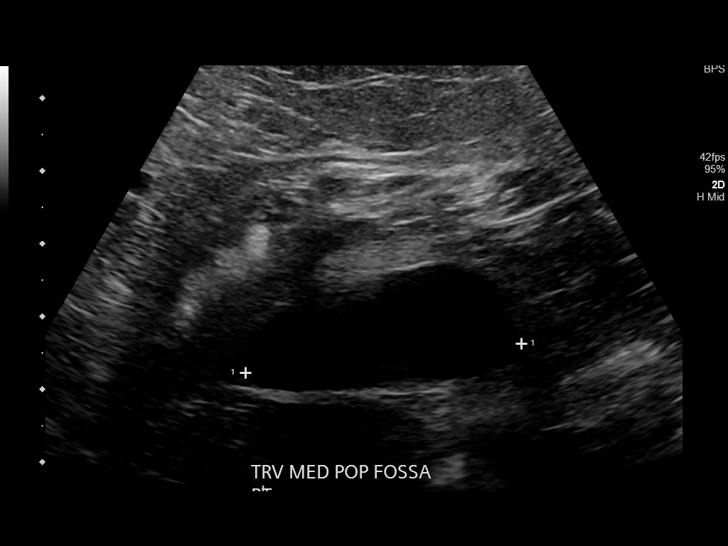

[13 of 24 positions shown; findings below may reference images not displayed]

FINDINGS: RIGHT LOWER EXTREMITY

Common Femoral Vein: No evidence of thrombus. Normal
compressibility, respiratory phasicity and response to augmentation.

Saphenofemoral Junction: No evidence of thrombus. Normal
compressibility and flow on color Doppler imaging.

Profunda Femoral Vein: No evidence of thrombus. Normal
compressibility and flow on color Doppler imaging.

Femoral Vein: No evidence of thrombus. Normal compressibility,
respiratory phasicity and response to augmentation.

Popliteal Vein: No evidence of thrombus. Normal compressibility,
respiratory phasicity and response to augmentation.

Calf Veins: No evidence of thrombus. Normal compressibility and flow
on color Doppler imaging.

Superficial Great Saphenous Vein: No evidence of thrombus. Normal
compressibility.

Venous Reflux:  None.

Other Findings: Note is made of an approximately 3.9 x 1.4 x 3.8 cm
anechoic fluid collection within the right popliteal fossa
compatible with a Baker cyst.

LEFT LOWER EXTREMITY

Common Femoral Vein: No evidence of thrombus. Normal
compressibility, respiratory phasicity and response to augmentation.

Saphenofemoral Junction: No evidence of thrombus. Normal
compressibility and flow on color Doppler imaging.

Profunda Femoral Vein: No evidence of thrombus. Normal
compressibility and flow on color Doppler imaging.

Femoral Vein: No evidence of thrombus. Normal compressibility,
respiratory phasicity and response to augmentation.

Popliteal Vein: No evidence of thrombus. Normal compressibility,
respiratory phasicity and response to augmentation.

Calf Veins: No evidence of thrombus. Normal compressibility and flow
on color Doppler imaging.

Superficial Great Saphenous Vein: No evidence of thrombus. Normal
compressibility.

Venous Reflux:  None.

Other Findings: Note is made an approximately 6.2 x 1.1 x 3.4 cm
anechoic fluid collection with the left popliteal fossa compatible
with a Baker's cyst.
IMPRESSION: 1. No evidence of DVT within either lower extremity.
2. Incidentally noted small bilateral anechoic Jhemboy cysts, the left
measuring 6.2 cm and the right measuring 3.9 cm.

## 2019-09-05 DIAGNOSIS — M2042 Other hammer toe(s) (acquired), left foot: Secondary | ICD-10-CM | POA: Diagnosis not present

## 2019-10-06 DIAGNOSIS — N3281 Overactive bladder: Secondary | ICD-10-CM | POA: Diagnosis not present

## 2019-10-06 DIAGNOSIS — N3941 Urge incontinence: Secondary | ICD-10-CM | POA: Diagnosis not present

## 2019-11-03 DIAGNOSIS — C5700 Malignant neoplasm of unspecified fallopian tube: Secondary | ICD-10-CM | POA: Diagnosis not present

## 2019-11-03 DIAGNOSIS — N3281 Overactive bladder: Secondary | ICD-10-CM | POA: Diagnosis not present

## 2019-11-03 DIAGNOSIS — N3941 Urge incontinence: Secondary | ICD-10-CM | POA: Diagnosis not present

## 2019-11-06 DIAGNOSIS — L821 Other seborrheic keratosis: Secondary | ICD-10-CM | POA: Diagnosis not present

## 2019-11-06 DIAGNOSIS — L570 Actinic keratosis: Secondary | ICD-10-CM | POA: Diagnosis not present

## 2019-11-07 DIAGNOSIS — M2042 Other hammer toe(s) (acquired), left foot: Secondary | ICD-10-CM | POA: Diagnosis not present

## 2019-11-14 ENCOUNTER — Telehealth: Payer: Self-pay | Admitting: Internal Medicine

## 2019-11-14 NOTE — Telephone Encounter (Signed)
Left message for patient to call back and schedule Medicare Annual Wellness Visit (AWV) either virtually or audio only.  Last AWV 10.4.19 ; please schedule at anytime with Denisa O'Brien-Blaney at [facility].

## 2019-11-17 DIAGNOSIS — R32 Unspecified urinary incontinence: Secondary | ICD-10-CM | POA: Diagnosis not present

## 2019-11-17 DIAGNOSIS — N3281 Overactive bladder: Secondary | ICD-10-CM | POA: Diagnosis not present

## 2019-11-17 DIAGNOSIS — N3941 Urge incontinence: Secondary | ICD-10-CM | POA: Diagnosis not present

## 2019-12-04 DIAGNOSIS — R200 Anesthesia of skin: Secondary | ICD-10-CM | POA: Diagnosis not present

## 2019-12-04 DIAGNOSIS — G610 Guillain-Barre syndrome: Secondary | ICD-10-CM | POA: Diagnosis not present

## 2019-12-04 DIAGNOSIS — G620 Drug-induced polyneuropathy: Secondary | ICD-10-CM | POA: Diagnosis not present

## 2019-12-04 DIAGNOSIS — R29898 Other symptoms and signs involving the musculoskeletal system: Secondary | ICD-10-CM | POA: Diagnosis not present

## 2019-12-04 DIAGNOSIS — T451X5A Adverse effect of antineoplastic and immunosuppressive drugs, initial encounter: Secondary | ICD-10-CM | POA: Diagnosis not present

## 2019-12-04 DIAGNOSIS — R202 Paresthesia of skin: Secondary | ICD-10-CM | POA: Diagnosis not present

## 2019-12-17 DIAGNOSIS — L570 Actinic keratosis: Secondary | ICD-10-CM | POA: Diagnosis not present

## 2019-12-19 DIAGNOSIS — M2042 Other hammer toe(s) (acquired), left foot: Secondary | ICD-10-CM | POA: Diagnosis not present

## 2019-12-19 DIAGNOSIS — M2041 Other hammer toe(s) (acquired), right foot: Secondary | ICD-10-CM | POA: Diagnosis not present

## 2020-01-30 DIAGNOSIS — H43813 Vitreous degeneration, bilateral: Secondary | ICD-10-CM | POA: Diagnosis not present

## 2020-01-30 DIAGNOSIS — H2513 Age-related nuclear cataract, bilateral: Secondary | ICD-10-CM | POA: Diagnosis not present

## 2020-02-02 DIAGNOSIS — M2042 Other hammer toe(s) (acquired), left foot: Secondary | ICD-10-CM | POA: Diagnosis not present

## 2020-02-02 DIAGNOSIS — M2041 Other hammer toe(s) (acquired), right foot: Secondary | ICD-10-CM | POA: Diagnosis not present

## 2020-02-03 DIAGNOSIS — Z808 Family history of malignant neoplasm of other organs or systems: Secondary | ICD-10-CM | POA: Diagnosis not present

## 2020-02-03 DIAGNOSIS — Z87891 Personal history of nicotine dependence: Secondary | ICD-10-CM | POA: Diagnosis not present

## 2020-02-03 DIAGNOSIS — Z8669 Personal history of other diseases of the nervous system and sense organs: Secondary | ICD-10-CM | POA: Diagnosis not present

## 2020-02-03 DIAGNOSIS — C5700 Malignant neoplasm of unspecified fallopian tube: Secondary | ICD-10-CM | POA: Diagnosis not present

## 2020-02-03 DIAGNOSIS — N3281 Overactive bladder: Secondary | ICD-10-CM | POA: Diagnosis not present

## 2020-02-03 DIAGNOSIS — Z800 Family history of malignant neoplasm of digestive organs: Secondary | ICD-10-CM | POA: Diagnosis not present

## 2020-02-03 DIAGNOSIS — G6289 Other specified polyneuropathies: Secondary | ICD-10-CM | POA: Diagnosis not present

## 2020-02-03 DIAGNOSIS — E039 Hypothyroidism, unspecified: Secondary | ICD-10-CM | POA: Diagnosis not present

## 2020-02-03 DIAGNOSIS — E559 Vitamin D deficiency, unspecified: Secondary | ICD-10-CM | POA: Diagnosis not present

## 2020-02-03 DIAGNOSIS — Z9229 Personal history of other drug therapy: Secondary | ICD-10-CM | POA: Diagnosis not present

## 2020-02-10 NOTE — Progress Notes (Signed)
[place]  [phone] [phone]  Patient Care Team: McLean-Scocuzza, Nino Glow, MD as PCP - General (Internal Medicine) Gillis Ends, MD as Referring Physician (Obstetrics and Gynecology) Bary Castilla Forest Gleason, MD as Surgeon (General Surgery) Sindy Guadeloupe, MD as Consulting Physician (Oncology)   Name of the patient: Melissa Anderson  [id number]  October 13, 1940   Date of visit: 02/11/2020   Gynecologic Oncology Interval Visit   Referring Provider: Dr Mike Gip  Chief Complaint: stage II ovarian cancer  Subjective:  Melissa Anderson is a 79 y.o. 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   She was last seen in clinic on 06/02/2019 and was referred for lymph edema management, but she did not go to this appointment. She was not aware of the referral.   Today she is doing well but has complaints of leg swelling of the ankle and peripheral neuropathy. She is on gabapentin but would like to come off this medication.   CT C/A/P ordered on 10/18/19, still pending.   Last CA125 10/09/2018 16.9  She had her first COVID vaccine last past Monday.   Gynecologic Oncology History:  She was at her PCP office in March and was complaining of intermittent weakness and pain shooting down her right leg. Her PCP ordered an MRI of her lumbosacral spine which demonstrated this right adnexal mass as an incidental finding.   Radiologic Imaging: 11/12/2017 Lumbosacral spine MRI scan noted a multicystic 4 x 5.5 cm right adnexal mass with fluid blood levels. Prominent uterine endometrial stripe.   12/20/2017 Pelvic Ultrasound: Uterus measures 9.7 x 4.1 x 5.9cm and is retroverted. Endometrial stripe measures 9 mm. Within it, as outlines by a small amount of fluid, there is a 1.4 x 0.7 x 1.4 cm polypoid echogenic structure with internal blood flow and small cystic components. A  similar appearing but smaller 5 x 4 x 3 mm echogenic nodular density is noted within the endometrial cavity as well. The uterus is otherwise normal in appearance. There is a multiloculated complex cystic structure in the right adnexa measuring 6.3 x 6.1 x 4.7 cm. This has solid appearing components as well as anechoic portions, separated by thick septations. On MRI, many of these loculations demonstrate fluid-filled levels. No normal ovarian parenchyma is identified. The left ovary was not seen. There is no pelvic free fluid.  She subsequently underwent an EMB with her gynecologist and this was negative.   She apparently was not aware that she had a cyst this size until she was seen by her provider who recommended Gyn Oncology consultation for further evaluation. She was seen by Dr. Carlton Adam who recommended surgery and further imaging and tumor markers.   02/05/2018: CA 125 = 18, CA 19-9 = 8, CEA <0.5, OVA1 = 4 (low risk)  02/09/2018 CT Scan A/P: multi-septated complex cystic lesion noted in the right pelvis.adnexa immediately superior to the uterus which measures approximately 6.9 x 5.3 am in greatest axial dimensions and approximately 5.4 cm in greatest cranial caudal dimensions.  Abnormal thickening of the endometrial cavity which measures 1.3 cm in greatest width.  Multiple peripelvic cysts are identified in bilateral renal hilar regions measuring up to 3.6 cm on the right and 2.9 cm on the left.  On 02/15/18, she underwent TLH, BSO, with bilateral pelvic LN dissection, peritoneal biopsies, and omental biopsy with Dr. Unknown Foley at [facility]. No spread beyond tube and ovaries.  She was discharged on POD1 w/o complication.    Pathology A. 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  See the synoptic report.  Note: The tumor is positive for PAX8, WT1, and p53 (strong, uniform). Based on the presence of tubal intraepithelial carcinoma, this is considered a tubal primary.  B.  Endometrium, curettage: Fragments of endometrial polyp.  C. Uterus, total hysterectomy:   Uterus:    Endometrium: Endometrial polyp with focal glandular crowding, see note.    Myometrium: Adenomyosis, leiomyomata (up to 1 cm).    Cervix: No pathologic diagnosis.     Serosa: No pathologic diagnosis.  Note: The areas of endometrial glandular crowding are not cytologically distinct from the background and therefore do not meet the criteria for endometrial intraepithelial neoplasia (EIN).  D. Right pelvic lymph nodes, lymphadenectomy: Three lymph nodes, negative for malignancy (0/3).   E. Left pelvic lymph nodes, lymphadenectomy:   Three lymph nodes, negative for malignancy (0/3).   F. Omentum, omentectomy:  Benign omentum.  G. Peritoneal biopsy #1:  Benign fibromuscular tissue.  H. Peritoneal biopsy #2: Benign fibrous tissue.  I. Peritoneal biopsy #3:  Benign fibroadipose tissue.  J. Peritoneal biopsy #4:  Benign fibroadipose tissue.  A. Pelvic Washings: - Negative. No Evidence of Malignancy  03/21/18. She was referred to Dr. Janese Banks, medical-oncology at [facility] and initiated cycle 1 of adjuvant carbo-Taxol on 03/21/18.   05/28/2018 Patient received cycle 4 of chemotherapy on . After 4 cycles of chemotherapy patient developed progressive weakness in her bilateral lower extremities and was admitted to the hospital in [place] where she developed worsening respiratory failure and had to be intubated was airlifted to [place].  Patient was eventually extubated.  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  She was given IVIG as well.  Of note she has received flu shot consistently in the  past and has never had these complications ever. Patient was then sent to a neuro rehab unit and was eventually discharged home.   08/16/2018 - CT C/A/P IMPRESSION: 1. Status post hysterectomy and bilateral salpingo oophorectomy. No findings for metastatic ovarian cancer. No peritoneal surface or omental disease or abdominal/pelvic lymphadenopathy. 2. Mild chronic scarring changes in the lungs but no findings worrisome for pulmonary metastatic disease.    Genetic Testing:  - [employer] 83 gene Multi-Cancer panel was negative for pathogenic mutation in any genes. No VUS detected.   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   History of ovarian cancer 02/11/2020   Acute pain of right shoulder 08/15/2018   Guillain Barr syndrome ([place]) 06/26/2018   Vitamin D deficiency 05/08/2018   Neuropathy 05/08/2018   Overactive bladder 05/08/2018   Bilateral leg edema 05/08/2018   HLD (hyperlipidemia) 05/08/2018   Goals of care, counseling/discussion 02/25/2018   Ovarian cancer ([place]) 02/22/2018   Graves' disease 02/13/2018    Past Medical History: Past Medical History:  Diagnosis Date   Arthritis    right shoulder   Cancer ([place]) 2019   b/l ovaries and left fallopian tube pelvic lymph nodes negative    Guillain Barr syndrome ([place])    2/2 flu shot 05/2018,  intubated x 8 days   Hyperlipidemia  Overactive bladder    Thyroid disease    UTI (urinary tract infection)    Vitamin D deficiency     Past Surgical History: Past Surgical History:  Procedure Laterality Date   ABDOMINAL HYSTERECTOMY     02/15/18    CHOLECYSTECTOMY  2008   LAPAROSCOPIC VAGINAL HYSTERECTOMY WITH SALPINGO OOPHORECTOMY  02/15/2018   PORTACATH PLACEMENT Left 02/25/2018   Procedure: INSERTION PORT-A-CATH;  Surgeon: Robert Bellow, MD;  Location: [facility] ORS;  Service: General;  Laterality: Left;   TOTAL  ABDOMINAL HYSTERECTOMY W/ BILATERAL SALPINGOOPHORECTOMY  02/15/2018   lymph nodes removed    OB History:  OB History  Gravida Para Term Preterm AB Living  2         2  SAB TAB Ectopic Multiple Live Births               # Outcome Date GA Lbr Len/2nd Weight Sex Delivery Anes PTL Lv  2 Gravida           1 Gravida             Obstetric Comments  Menstrual age: 47    Age 1st Pregnancy: 59    Family History: Family History  Problem Relation Age of Onset   Sudden death Mother 29       drowning in hurricane Katrina   Pancreatic cancer Father        deceased early 17s; smoker   Alcohol abuse Father    COPD Father    Coronary artery disease Brother    Melanoma Sister        deceased at 76   COPD Sister    Prostate cancer Maternal Uncle        deceased 64   Breast cancer Paternal Aunt        unsure of this dx    Social History: Social History   Socioeconomic History   Marital status: Widowed    Spouse name: Not on file   Number of children: Not on file   Years of education: Not on file   Highest education level: Not on file  Occupational History   Not on file  Tobacco Use   Smoking status: Former Smoker    Packs/day: 1.00    Years: 25.00    Pack years: 25.00    Types: Cigarettes    Quit date: 02/26/1984    Years since quitting: 35.9   Smokeless tobacco: Never Used   Tobacco comment: age 74 to 8 1 ppd   Vaping Use   Vaping Use: Never used  Substance and Sexual Activity   Alcohol use: Never   Drug use: Never   Sexual activity: Not Currently  Other Topics Concern   Not on file  Social History Narrative   Engineer, maintenance (IT), bachelors degree    Lives in [place] here visiting daughter [name]    Former smoker quit age 41 y.o    Social Determinants of Health   Financial Resource Strain:    Difficulty of Paying Living Expenses:   Food Insecurity:    Worried About Charity fundraiser in the Last Year:    Arboriculturist in the Last Year:     Transportation Needs:    Film/video editor (Medical):    Lack of Transportation (Non-Medical):   Physical Activity:    Days of Exercise per Week:    Minutes of Exercise per Session:   Stress:    Feeling of Stress :   Social  Connections:    Frequency of Communication with Friends and Family:    Frequency of Social Gatherings with Friends and Family:    Attends Religious Services:    Active Member of Clubs or Organizations:    Attends Archivist Meetings:    Marital Status:   Intimate Partner Violence:    Fear of Current or Ex-Partner:    Emotionally Abused:    Physically Abused:    Sexually Abused:     Allergies: Allergies  Allergen Reactions   Crestor [Rosuvastatin]     Myopathy     Epinephrine Other (See Comments)    Funny feeling, heart racing    Influenza Vaccines     ?GBS 05/2018    Lipitor [Atorvastatin Calcium]     Myalgias      Current Medications: Current Outpatient Medications  Medication Sig Dispense Refill   aspirin EC 81 MG tablet Take 81 mg by mouth daily.     gabapentin (NEURONTIN) 300 MG capsule Take 300 mg by mouth 3 (three) times daily.     mirabegron ER (MYRBETRIQ) 50 MG TB24 tablet Take 1 tablet (50 mg total) by mouth daily. 30 tablet 11   OVER THE COUNTER MEDICATION Obagi facial cleansing otc system - use as directed     SYNTHROID 175 MCG tablet Take 1 tablet (175 mcg total) by mouth daily. 90 tablet 3   Vitamin D, Ergocalciferol, (DRISDOL) 50000 units CAPS capsule TAKE 1 CAPSULE (50,000 UNITS TOTAL) BY MOUTH EVERY WEDNESDAY. 4 capsule 0   oxybutynin (DITROPAN-XL) 10 MG 24 hr tablet Take 10 mg by mouth daily.     No current facility-administered medications for this visit.   Review of Systems General: no complaints  HEENT: no complaints  Lungs: no complaints  Cardiac: no complaints  GI: no complaints  GU: no complaints  Musculoskeletal: no complaints  Extremities: as noted in HPI  Skin: no  complaints  Neuro: peripheral neuropathy  Endocrine: no complaints  Psych: no complaints        Objective:  Physical Examination: BP 132/60 (BP Location: Left Arm, Patient Position: Sitting, Cuff Size: Large)    Pulse 85    Temp 98.3 F (36.8 C) (Oral)    Resp 18    Wt 186 lb 11.2 oz (84.7 kg)    SpO2 96%    BMI 30.13 kg/m      ECOG Performance Status: 1 - Symptomatic but completely ambulatory  GENERAL: Patient is a well appearing female in no acute distress HEENT:  Atraumatic and normocephalic. PERRL, neck supple. NODES:  No cervical, supraclavicular, axillary, or inguinal lymphadenopathy palpated.  LUNGS:  Clear to auscultation bilaterally.  No wheezes. HEART:  Regular rate and rhythm.  ABDOMEN:  Soft, nontender. Nondistended. No masses/ascites/hernia/or hepatomegaly.  EXTREMITIES:  1+ bilateral edema; + variosities; decreased discrimination to light-touch and sharp; decreased proprioception left toe.  SKIN:  Clear with no obvious rashes or skin changes. No nail dyscrasia. NEURO:  Nonfocal. Well oriented.  Appropriate affect.  Pelvic: EGBUS: no lesions Cervix: surgically absent Vagina: no lesions, no discharge or bleeding Uterus: surgically absent BME: no palpable masses Rectovaginal: deferred        Assessment:  Melissa Anderson is a 79 y.o. 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 Unknown Foley. She has initiated chemotherapy 03/21/2018 with carbo-Taxol and is treated by Dr. Janese Banks at [facility]. Chemotherapy discontinued after 05/28/2018  due to GBS from flu shot. Clinically NED.   Peripheral neuropathy.   Symptomatic bilateral lymphedema, possibly secondary to surgery.   She has completed germline genetic testing which was negative for mutation.   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 Scheduled CT scan around her COVID vaccination schedule to avoid false positive results. We may need to hold CT until she returns to the area in a few months.   We previously reviewed that recurrence risk may range from 25-30% based on the literature. We reviewed surveillance options. Typically imaging is only performed as clinically indicated, but in her case CA125 is not a marker. Therefore we discussed obtaining a CT scan every 6 months for the first two years and then reassessing the interval. We will also perform assessments with physical exam including pelvic exam every 3-4 months for 3 years, then 6 months until five years, then annually thereafter.   Somatic testing was not recommended at this time because she does not have a BRCA mutation and is not a candidate for maintenance PARP inhibitor after completing primary chemotherapy.  Somatic testing for mutation and HRD can be considered should she have recurrence.   Refer to PT for lymphedema at her next visit if she will be local for a longer period of time. She would like to see someone here at [facility] for management.   I personally had a face to face interaction and evaluated the patient jointly with the NP student, Ms. Benedetto Goad.  I have reviewed her history and available records and have performed all portions of the physical exam including with my findings confirming those documented above by the APP.  I have discussed  the case with the APP student and the patient.  I agree with the above documentation, assessment and plan which was fully formulated by me.  Counseling was completed by me.   Khy Pitre Gaetana Michaelis, MD

## 2020-02-11 ENCOUNTER — Inpatient Hospital Stay: Payer: Medicare Other

## 2020-02-11 ENCOUNTER — Other Ambulatory Visit: Payer: Self-pay

## 2020-02-11 ENCOUNTER — Inpatient Hospital Stay: Payer: Medicare Other | Attending: Obstetrics and Gynecology | Admitting: Obstetrics and Gynecology

## 2020-02-11 VITALS — BP 132/60 | HR 85 | Temp 98.3°F | Resp 18 | Wt 186.7 lb

## 2020-02-11 DIAGNOSIS — Z87891 Personal history of nicotine dependence: Secondary | ICD-10-CM | POA: Diagnosis not present

## 2020-02-11 DIAGNOSIS — E079 Disorder of thyroid, unspecified: Secondary | ICD-10-CM | POA: Diagnosis not present

## 2020-02-11 DIAGNOSIS — M1990 Unspecified osteoarthritis, unspecified site: Secondary | ICD-10-CM | POA: Insufficient documentation

## 2020-02-11 DIAGNOSIS — G620 Drug-induced polyneuropathy: Secondary | ICD-10-CM | POA: Diagnosis not present

## 2020-02-11 DIAGNOSIS — Z9221 Personal history of antineoplastic chemotherapy: Secondary | ICD-10-CM | POA: Diagnosis not present

## 2020-02-11 DIAGNOSIS — E785 Hyperlipidemia, unspecified: Secondary | ICD-10-CM | POA: Insufficient documentation

## 2020-02-11 DIAGNOSIS — T451X5A Adverse effect of antineoplastic and immunosuppressive drugs, initial encounter: Secondary | ICD-10-CM | POA: Diagnosis not present

## 2020-02-11 DIAGNOSIS — G629 Polyneuropathy, unspecified: Secondary | ICD-10-CM | POA: Insufficient documentation

## 2020-02-11 DIAGNOSIS — Z08 Encounter for follow-up examination after completed treatment for malignant neoplasm: Secondary | ICD-10-CM | POA: Insufficient documentation

## 2020-02-11 DIAGNOSIS — Z8543 Personal history of malignant neoplasm of ovary: Secondary | ICD-10-CM | POA: Diagnosis not present

## 2020-02-11 DIAGNOSIS — Z90710 Acquired absence of both cervix and uterus: Secondary | ICD-10-CM | POA: Diagnosis not present

## 2020-02-11 DIAGNOSIS — J984 Other disorders of lung: Secondary | ICD-10-CM | POA: Diagnosis not present

## 2020-02-11 DIAGNOSIS — C561 Malignant neoplasm of right ovary: Secondary | ICD-10-CM

## 2020-02-11 DIAGNOSIS — Z90722 Acquired absence of ovaries, bilateral: Secondary | ICD-10-CM | POA: Diagnosis not present

## 2020-02-11 DIAGNOSIS — Z79899 Other long term (current) drug therapy: Secondary | ICD-10-CM | POA: Insufficient documentation

## 2020-02-11 DIAGNOSIS — Z7982 Long term (current) use of aspirin: Secondary | ICD-10-CM | POA: Diagnosis not present

## 2020-02-11 DIAGNOSIS — I890 Lymphedema, not elsewhere classified: Secondary | ICD-10-CM | POA: Diagnosis not present

## 2020-02-11 NOTE — Addendum Note (Signed)
Addended by: Mariea Clonts D on: 02/11/2020 11:03 AM   Modules accepted: Orders

## 2020-02-12 LAB — CA 125: Cancer Antigen (CA) 125: 16 U/mL (ref 0.0–38.1)

## 2020-02-27 ENCOUNTER — Ambulatory Visit: Payer: Medicare Other | Admitting: Internal Medicine

## 2020-03-03 DIAGNOSIS — Z8669 Personal history of other diseases of the nervous system and sense organs: Secondary | ICD-10-CM | POA: Diagnosis not present

## 2020-03-03 DIAGNOSIS — T451X5A Adverse effect of antineoplastic and immunosuppressive drugs, initial encounter: Secondary | ICD-10-CM | POA: Diagnosis not present

## 2020-03-03 DIAGNOSIS — G620 Drug-induced polyneuropathy: Secondary | ICD-10-CM | POA: Diagnosis not present

## 2020-03-04 ENCOUNTER — Ambulatory Visit: Payer: Medicare Other | Admitting: Internal Medicine

## 2020-03-04 DIAGNOSIS — H6123 Impacted cerumen, bilateral: Secondary | ICD-10-CM | POA: Diagnosis not present

## 2020-03-04 DIAGNOSIS — H903 Sensorineural hearing loss, bilateral: Secondary | ICD-10-CM | POA: Diagnosis not present

## 2020-03-09 ENCOUNTER — Encounter: Payer: Self-pay | Admitting: Internal Medicine

## 2020-03-09 ENCOUNTER — Other Ambulatory Visit: Payer: Self-pay

## 2020-03-09 ENCOUNTER — Ambulatory Visit (INDEPENDENT_AMBULATORY_CARE_PROVIDER_SITE_OTHER): Payer: Medicare Other | Admitting: Internal Medicine

## 2020-03-09 VITALS — BP 120/68 | HR 73 | Temp 98.4°F | Ht 66.0 in | Wt 184.4 lb

## 2020-03-09 DIAGNOSIS — Z13818 Encounter for screening for other digestive system disorders: Secondary | ICD-10-CM | POA: Diagnosis not present

## 2020-03-09 DIAGNOSIS — D649 Anemia, unspecified: Secondary | ICD-10-CM | POA: Diagnosis not present

## 2020-03-09 DIAGNOSIS — Z8543 Personal history of malignant neoplasm of ovary: Secondary | ICD-10-CM

## 2020-03-09 DIAGNOSIS — C561 Malignant neoplasm of right ovary: Secondary | ICD-10-CM

## 2020-03-09 DIAGNOSIS — E039 Hypothyroidism, unspecified: Secondary | ICD-10-CM | POA: Diagnosis not present

## 2020-03-09 DIAGNOSIS — N3000 Acute cystitis without hematuria: Secondary | ICD-10-CM

## 2020-03-09 DIAGNOSIS — G629 Polyneuropathy, unspecified: Secondary | ICD-10-CM | POA: Diagnosis not present

## 2020-03-09 DIAGNOSIS — E785 Hyperlipidemia, unspecified: Secondary | ICD-10-CM

## 2020-03-09 NOTE — Patient Instructions (Addendum)
Scrambler therapy with neurology  Alpha lipoic acid 600 mg 2x per day  We also use cymbalta for nerve pain   Duloxetine delayed-release capsules What is this medicine? DULOXETINE (doo LOX e teen) is used to treat depression, anxiety, and different types of chronic pain. This medicine may be used for other purposes; ask your health care provider or pharmacist if you have questions. COMMON BRAND NAME(S): Cymbalta, Creig Hines, Irenka What should I tell my health care provider before I take this medicine? They need to know if you have any of these conditions:  bipolar disorder  glaucoma  high blood pressure  kidney disease  liver disease  seizures  suicidal thoughts, plans or attempt; a previous suicide attempt by you or a family member  take medicines that treat or prevent blood clots  taken medicines called MAOIs like Carbex, Eldepryl, Marplan, Nardil, and Parnate within 14 days  trouble passing urine  an unusual reaction to duloxetine, other medicines, foods, dyes, or preservatives  pregnant or trying to get pregnant  breast-feeding How should I use this medicine? Take this medicine by mouth with a glass of water. Follow the directions on the prescription label. Do not crush, cut or chew some capsules of this medicine. Some capsules may be opened and sprinkled on applesauce. Check with your doctor or pharmacist if you are not sure. You can take this medicine with or without food. Take your medicine at regular intervals. Do not take your medicine more often than directed. Do not stop taking this medicine suddenly except upon the advice of your doctor. Stopping this medicine too quickly may cause serious side effects or your condition may worsen. A special MedGuide will be given to you by the pharmacist with each prescription and refill. Be sure to read this information carefully each time. Talk to your pediatrician regarding the use of this medicine in children. While this drug  may be prescribed for children as young as 50 years of age for selected conditions, precautions do apply. Overdosage: If you think you have taken too much of this medicine contact a poison control center or emergency room at once. NOTE: This medicine is only for you. Do not share this medicine with others. What if I miss a dose? If you miss a dose, take it as soon as you can. If it is almost time for your next dose, take only that dose. Do not take double or extra doses. What may interact with this medicine? Do not take this medicine with any of the following medications:  desvenlafaxine  levomilnacipran  linezolid  MAOIs like Carbex, Eldepryl, Marplan, Nardil, and Parnate  methylene blue (injected into a vein)  milnacipran  thioridazine  venlafaxine This medicine may also interact with the following medications:  alcohol  amphetamines  aspirin and aspirin-like medicines  certain antibiotics like ciprofloxacin and enoxacin  certain medicines for blood pressure, heart disease, irregular heart beat  certain medicines for depression, anxiety, or psychotic disturbances  certain medicines for migraine headache like almotriptan, eletriptan, frovatriptan, naratriptan, rizatriptan, sumatriptan, zolmitriptan  certain medicines that treat or prevent blood clots like warfarin, enoxaparin, and dalteparin  cimetidine  fentanyl  lithium  NSAIDS, medicines for pain and inflammation, like ibuprofen or naproxen  phentermine  procarbazine  rasagiline  sibutramine  St. John's wort  theophylline  tramadol  tryptophan This list may not describe all possible interactions. Give your health care provider a list of all the medicines, herbs, non-prescription drugs, or dietary supplements you use. Also tell  them if you smoke, drink alcohol, or use illegal drugs. Some items may interact with your medicine. What should I watch for while using this medicine? Tell your doctor if  your symptoms do not get better or if they get worse. Visit your doctor or healthcare provider for regular checks on your progress. Because it may take several weeks to see the full effects of this medicine, it is important to continue your treatment as prescribed by your doctor. This medicine may cause serious skin reactions. They can happen weeks to months after starting the medicine. Contact your healthcare provider right away if you notice fevers or flu-like symptoms with a rash. The rash may be red or purple and then turn into blisters or peeling of the skin. Or, you might notice a red rash with swelling of the face, lips, or lymph nodes in your neck or under your arms. Patients and their families should watch out for new or worsening thoughts of suicide or depression. Also watch out for sudden changes in feelings such as feeling anxious, agitated, panicky, irritable, hostile, aggressive, impulsive, severely restless, overly excited and hyperactive, or not being able to sleep. If this happens, especially at the beginning of treatment or after a change in dose, call your healthcare provider. You may get drowsy or dizzy. Do not drive, use machinery, or do anything that needs mental alertness until you know how this medicine affects you. Do not stand or sit up quickly, especially if you are an older patient. This reduces the risk of dizzy or fainting spells. Alcohol may interfere with the effect of this medicine. Avoid alcoholic drinks. This medicine can cause an increase in blood pressure. This medicine can also cause a sudden drop in your blood pressure, which may make you feel faint and increase the chance of a fall. These effects are most common when you first start the medicine or when the dose is increased, or during use of other medicines that can cause a sudden drop in blood pressure. Check with your doctor for instructions on monitoring your blood pressure while taking this medicine. Your mouth may  get dry. Chewing sugarless gum or sucking hard candy, and drinking plenty of water, may help. Contact your doctor if the problem does not go away or is severe. What side effects may I notice from receiving this medicine? Side effects that you should report to your doctor or health care professional as soon as possible:  allergic reactions like skin rash, itching or hives, swelling of the face, lips, or tongue  anxious  breathing problems  confusion  changes in vision  chest pain  confusion  elevated mood, decreased need for sleep, racing thoughts, impulsive behavior  eye pain  fast, irregular heartbeat  feeling faint or lightheaded, falls  feeling agitated, angry, or irritable  hallucination, loss of contact with reality  high blood pressure  loss of balance or coordination  palpitations  redness, blistering, peeling or loosening of the skin, including inside the mouth  restlessness, pacing, inability to keep still  seizures  stiff muscles  suicidal thoughts or other mood changes  trouble passing urine or change in the amount of urine  trouble sleeping  unusual bleeding or bruising  unusually weak or tired  vomiting  yellowing of the eyes or skin Side effects that usually do not require medical attention (report to your doctor or health care professional if they continue or are bothersome):  change in sex drive or performance  change in appetite  or weight  constipation  dizziness  dry mouth  headache  increased sweating  nausea  tired This list may not describe all possible side effects. Call your doctor for medical advice about side effects. You may report side effects to FDA at 1-800-FDA-1088. Where should I keep my medicine? Keep out of the reach of children. Store at room temperature between 15 and 30 degrees C (59 to 86 degrees F). Throw away any unused medicine after the expiration date. NOTE: This sheet is a summary. It may not  cover all possible information. If you have questions about this medicine, talk to your doctor, pharmacist, or health care provider.  2020 Elsevier/Gold Standard (2018-11-14 13:47:50)  Neuropathic Pain Neuropathic pain is pain caused by damage to the nerves that are responsible for certain sensations in your body (sensory nerves). The pain can be caused by:  Damage to the sensory nerves that send signals to your spinal cord and brain (peripheral nervous system).  Damage to the sensory nerves in your brain or spinal cord (central nervous system). Neuropathic pain can make you more sensitive to pain. Even a minor sensation can feel very painful. This is usually a long-term condition that can be difficult to treat. The type of pain differs from person to person. It may:  Start suddenly (acute), or it may develop slowly and last for a long time (chronic).  Come and go as damaged nerves heal, or it may stay at the same level for years.  Cause emotional distress, loss of sleep, and a lower quality of life. What are the causes? The most common cause of this condition is diabetes. Many other diseases and conditions can also cause neuropathic pain. Causes of neuropathic pain can be classified as:  Toxic. This is caused by medicines and chemicals. The most common cause of toxic neuropathic pain is damage from cancer treatments (chemotherapy).  Metabolic. This can be caused by: ? Diabetes. This is the most common disease that damages the nerves. ? Lack of vitamin B from long-term alcohol abuse.  Traumatic. Any injury that cuts, crushes, or stretches a nerve can cause damage and pain. A common example is feeling pain after losing an arm or leg (phantom limb pain).  Compression-related. If a sensory nerve gets trapped or compressed for a long period of time, the blood supply to the nerve can be cut off.  Vascular. Many blood vessel diseases can cause neuropathic pain by decreasing blood supply and  oxygen to nerves.  Autoimmune. This type of pain results from diseases in which the body's defense system (immune system) mistakenly attacks sensory nerves. Examples of autoimmune diseases that can cause neuropathic pain include lupus and multiple sclerosis.  Infectious. Many types of viral infections can damage sensory nerves and cause pain. Shingles infection is a common cause of this type of pain.  Inherited. Neuropathic pain can be a symptom of many diseases that are passed down through families (genetic). What increases the risk? You are more likely to develop this condition if:  You have diabetes.  You smoke.  You drink too much alcohol.  You are taking certain medicines, including medicines that kill cancer cells (chemotherapy) or that treat immune system disorders. What are the signs or symptoms? The main symptom is pain. Neuropathic pain is often described as:  Burning.  Shock-like.  Stinging.  Hot or cold.  Itching. How is this diagnosed? No single test can diagnose neuropathic pain. It is diagnosed based on:  Physical exam and your symptoms.  Your health care provider will ask you about your pain. You may be asked to use a pain scale to describe how bad your pain is.  Tests. These may be done to see if you have a high sensitivity to pain and to help find the cause and location of any sensory nerve damage. They include: ? Nerve conduction studies to test how well nerve signals travel through your sensory nerves (electrodiagnostic testing). ? Stimulating your sensory nerves through electrodes on your skin and measuring the response in your spinal cord and brain (somatosensory evoked potential).  Imaging studies, such as: ? X-rays. ? CT scan. ? MRI. How is this treated? Treatment for neuropathic pain may change over time. You may need to try different treatment options or a combination of treatments. Some options include:  Treating the underlying cause of the  neuropathy, such as diabetes, kidney disease, or vitamin deficiencies.  Stopping medicines that can cause neuropathy, such as chemotherapy.  Medicine to relieve pain. Medicines may include: ? Prescription or over-the-counter pain medicine. ? Anti-seizure medicine. ? Antidepressant medicines. ? Pain-relieving patches that are applied to painful areas of skin. ? A medicine to numb the area (local anesthetic), which can be injected as a nerve block.  Transcutaneous nerve stimulation. This uses electrical currents to block painful nerve signals. The treatment is painless.  Alternative treatments, such as: ? Acupuncture. ? Meditation. ? Massage. ? Physical therapy. ? Pain management programs. ? Counseling. Follow these instructions at home: Medicines   Take over-the-counter and prescription medicines only as told by your health care provider.  Do not drive or use heavy machinery while taking prescription pain medicine.  If you are taking prescription pain medicine, take actions to prevent or treat constipation. Your health care provider may recommend that you: ? Drink enough fluid to keep your urine pale yellow. ? Eat foods that are high in fiber, such as fresh fruits and vegetables, whole grains, and beans. ? Limit foods that are high in fat and processed sugars, such as fried or sweet foods. ? Take an over-the-counter or prescription medicine for constipation. Lifestyle   Have a good support system at home.  Consider joining a chronic pain support group.  Do not use any products that contain nicotine or tobacco, such as cigarettes and e-cigarettes. If you need help quitting, ask your health care provider.  Do not drink alcohol. General instructions  Learn as much as you can about your condition.  Work closely with all your health care providers to find the treatment plan that works best for you.  Ask your health care provider what activities are safe for you.  Keep all  follow-up visits as told by your health care provider. This is important. Contact a health care provider if:  Your pain treatments are not working.  You are having side effects from your medicines.  You are struggling with tiredness (fatigue), mood changes, depression, or anxiety. Summary  Neuropathic pain is pain caused by damage to the nerves that are responsible for certain sensations in your body (sensory nerves).  Neuropathic pain may come and go as damaged nerves heal, or it may stay at the same level for years.  Neuropathic pain is usually a long-term condition that can be difficult to treat. Consider joining a chronic pain support group. This information is not intended to replace advice given to you by your health care provider. Make sure you discuss any questions you have with your health care provider. Document Revised: 12/05/2018 Document  Reviewed: 08/31/2017 Elsevier Patient Education  [employer].

## 2020-03-09 NOTE — Progress Notes (Signed)
Chief Complaint  Patient presents with   Annual Exam   F/u  1. Ovarian cancer doing  Well so far repeat CT chest/ab/pelvis sch 05/2020 when pt returns from [place] for surveillance  She had labs in 08/06/19 in [place] which are scanned in chart and reviewed today all markers normal  Also on labs had E coli UTI but she never took abx from labs done 08/06/19 2. 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 She also has been on a statin and thinks this could have made sxs' worse  sxs worse in the am  She is going to try supplements she ordered plus we disc alpha lipoic acid today  Neurology Dr. Melrose Nakayama rec PT and will ask him if patient would benefit from scrambler tx. She wants to do PT when returns 05/2020 3. Overactive bladder she is not sure what medication prior MD in [place] gave her but may be oxybutynin 10 mg she will confirm with pharmacy  4. Hypothyroidism on synthyroid 175 mcg qd tsh 08/06/19 was 3.76  5. She is needing hearing aids deciding if will pay cost   Review of Systems  Constitutional: Negative for weight loss.  HENT: Negative for hearing loss.   Eyes: Negative for blurred vision.  Respiratory: Negative for shortness of breath.   Cardiovascular: Negative for chest pain.  Gastrointestinal: Negative for abdominal pain.  Musculoskeletal: Negative for falls.  Skin: Negative for rash.  Neurological: Negative for headaches.  Psychiatric/Behavioral: Negative for depression.   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[place])    2/2 flu shot 05/2018,  intubated x 8 days   Hyperlipidemia    Overactive bladder    Thyroid disease    UTI (urinary tract infection)    Vitamin D  deficiency    Past Surgical History:  Procedure Laterality Date   ABDOMINAL HYSTERECTOMY     02/15/18    CHOLECYSTECTOMY  2008   LAPAROSCOPIC VAGINAL HYSTERECTOMY WITH SALPINGO OOPHORECTOMY  02/15/2018   PORTACATH PLACEMENT Left 02/25/2018   Procedure: INSERTION PORT-A-CATH;  Surgeon: Robert Bellow, MD;  Location: [facility] ORS;  Service: General;  Laterality: Left;   TOTAL ABDOMINAL HYSTERECTOMY W/ BILATERAL SALPINGOOPHORECTOMY  02/15/2018   lymph nodes removed   Family History  Problem Relation Age of Onset   Sudden death Mother 41       drowning in hurricane Katrina   Pancreatic cancer Father        deceased early 76s; smoker   Alcohol abuse Father    COPD Father    Coronary artery disease Brother    Melanoma Sister        deceased at 46   COPD Sister    Prostate cancer Maternal Uncle        deceased 45   Breast cancer Paternal Aunt        unsure of this dx   Social History   Socioeconomic History   Marital status: Widowed    Spouse name: Not on file   Number of children: Not on file   Years of education: Not on file   Highest education level: Not on file  Occupational History   Not on file  Tobacco Use   Smoking status: Former Smoker    Packs/day: 1.00    Years: 25.00    Pack years: 25.00    Types: Cigarettes    Quit date: 02/26/1984    Years since quitting: 36.0   Smokeless tobacco: Never Used   Tobacco comment: age 36 to 78 1 ppd   Vaping Use   Vaping Use: Never used  Substance and Sexual Activity   Alcohol use: Never   Drug use: Never   Sexual activity: Not Currently  Other Topics Concern   Not on file  Social History Stage manager, bachelors degree    Lives in [place] here visiting daughter [name]    Former smoker quit age 66 y.o    Social Determinants of Health   Financial Resource Strain:    Difficulty of Paying Living Expenses:   Food Insecurity:    Worried About Charity fundraiser in the Last Year:     Arboriculturist in the Last Year:   Transportation Needs:    Film/video editor (Medical):    Lack of Transportation (Non-Medical):   Physical Activity:    Days of Exercise per Week:    Minutes of Exercise per Session:   Stress:    Feeling of Stress :   Social Connections:    Frequency of Communication with Friends and Family:    Frequency of Social Gatherings with Friends and Family:    Attends Religious Services:    Active Member of Clubs or Organizations:    Attends Music therapist:    Marital Status:   Intimate Partner Violence:    Fear of Current or Ex-Partner:    Emotionally Abused:    Physically Abused:    Sexually Abused:    Current Meds  Medication Sig   aspirin EC 81 MG tablet Take 81 mg by mouth daily.   gabapentin (NEURONTIN) 300 MG capsule Take 300 mg by mouth 2 (two) times daily.    oxybutynin (DITROPAN-XL) 10 MG 24 hr tablet Take 10 mg by mouth daily.   SYNTHROID 175 MCG tablet Take 1 tablet (175 mcg total) by mouth daily.   [DISCONTINUED] OVER THE COUNTER MEDICATION Obagi facial cleansing otc system - use as directed   [DISCONTINUED] Vitamin D, Ergocalciferol, (DRISDOL) 50000 units CAPS capsule TAKE 1 CAPSULE (50,000 UNITS TOTAL) BY MOUTH EVERY WEDNESDAY.   Allergies  Allergen Reactions   Crestor [Rosuvastatin]     Myopathy     Epinephrine Other (See Comments)    Funny feeling, heart racing    Influenza Vaccines     ?GBS 05/2018    Lipitor [Atorvastatin Calcium]     Myalgias     Recent Results (from the past 2160 hour(s))  CA 125     Status: None   Collection Time: 02/11/20 10:07 AM  Result Value Ref Range   Cancer Antigen (CA) 125 16.0 0.0 - 38.1 U/mL    Comment: (NOTE) Roche Diagnostics Electrochemiluminescence Immunoassay (ECLIA) Values obtained with different assay methods or kits cannot be used interchangeably.  Results cannot be interpreted as absolute evidence of the presence or absence of  malignant disease. Performed At: [facility] [place], [place] [id number] Rush Farmer MD [id number]    Objective  Body mass index is 29.76 kg/m. Wt Readings from Last 3 Encounters:  03/09/20 184 lb 6.4 oz (83.6 kg)  02/11/20 186 lb 11.2 oz (84.7 kg)  10/10/18 173 lb (78.5 kg)  Temp Readings from Last 3 Encounters:  03/09/20 98.4 F (36.9 C) (Oral)  02/11/20 98.3 F (36.8 C) (Oral)  10/09/18 (!) 96.5 F (35.8 C) (Tympanic)   BP Readings from Last 3 Encounters:  03/09/20 120/68  02/11/20 132/60  10/10/18 (!) 100/58   Pulse Readings from Last 3 Encounters:  03/09/20 73  02/11/20 85  10/10/18 99    Physical Exam Vitals and nursing note reviewed.  Constitutional:      Appearance: Normal appearance. She is well-developed and well-groomed.  HENT:     Head: Normocephalic and atraumatic.  Eyes:     Conjunctiva/sclera: Conjunctivae normal.     Pupils: Pupils are equal, round, and reactive to light.  Cardiovascular:     Rate and Rhythm: Normal rate and regular rhythm.     Heart sounds: Normal heart sounds. No murmur heard.   Pulmonary:     Effort: Pulmonary effort is normal.     Breath sounds: Normal breath sounds.  Skin:    General: Skin is warm and dry.  Neurological:     General: No focal deficit present.     Mental Status: She is alert and oriented to person, place, and time. Mental status is at baseline.     Gait: Gait normal.  Psychiatric:        Attention and Perception: Attention and perception normal.        Mood and Affect: Mood and affect normal.        Speech: Speech normal.        Behavior: Behavior normal. Behavior is cooperative.        Thought Content: Thought content normal.        Cognition and Memory: Cognition and memory normal.        Judgment: Judgment normal.     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 Theora Gianotti   Hypothyroidism Cont levo 175 mcg qd   HM No further vaccines due to GBS flu shot had 05/31/18. She had had flu shot prev and no sx's.  Tdap per pt had 5 years ago get record pt to get record  Prevnar, pna 23, shingles vaccines  -pt to track down records.  covid 2/2 pfizer had and tolerated   Former smoker max 1 ppd from age 52 to 89 no FH lung cancer  Former smoker quit 30 years ago reviewed prior imaging lungs + scarring. She also reports h/o pneumonia   Out of age window pap s/p total hysterectomy 02/15/18 for ovarian cancer with b/l ovaries and left fallopian tube effected negative b/l lymph nodes 3 LN bx'ed   02/06/2019 neg cologaurd   Mammogram per pt had 6 months ago in [place] requested records today   DEXA had in the past requested records prev   Gyn/onc Dr. Theora Gianotti H/o Dr. Janese Banks Provider: Dr. Olivia Mackie McLean-Scocuzza-Internal Medicine

## 2020-03-11 ENCOUNTER — Telehealth: Payer: Self-pay | Admitting: Internal Medicine

## 2020-03-11 DIAGNOSIS — E039 Hypothyroidism, unspecified: Secondary | ICD-10-CM | POA: Insufficient documentation

## 2020-03-11 LAB — URINE CULTURE
MICRO NUMBER:: 10698733
SPECIMEN QUALITY:: ADEQUATE

## 2020-03-11 NOTE — Telephone Encounter (Signed)
Pt returned your call about results °

## 2020-03-12 NOTE — Telephone Encounter (Signed)
Patient informed and verbalized understanding

## 2020-03-14 ENCOUNTER — Other Ambulatory Visit: Payer: Self-pay | Admitting: Internal Medicine

## 2020-03-14 DIAGNOSIS — N3000 Acute cystitis without hematuria: Secondary | ICD-10-CM

## 2020-03-14 IMAGING — US US RENAL
1 series · 14 of 25 positions shown · non-contrast
Comparison: CT scan of February 27, 2018.

CLINICAL DATA: Abnormal CT scan.

EXAM:
RENAL / URINARY TRACT ULTRASOUND COMPLETE

[Series 1: us renal · 0.23mm/px · 49 acquisitions, 14 frames shown]
[im 1/49]
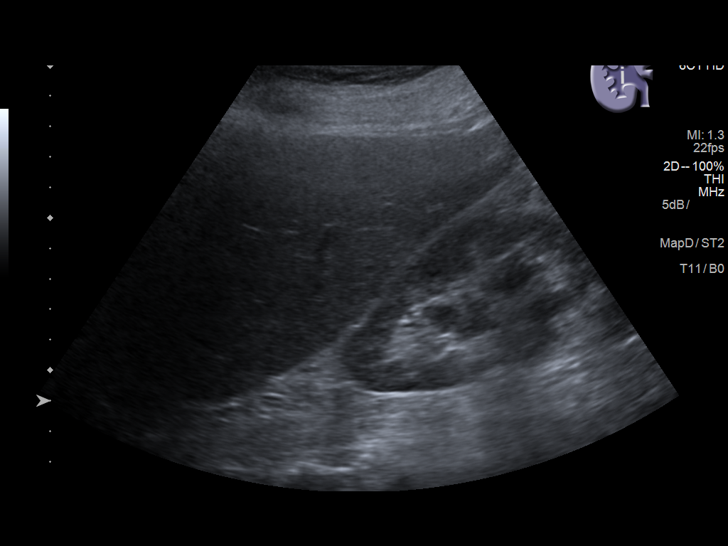
[im 5/49]
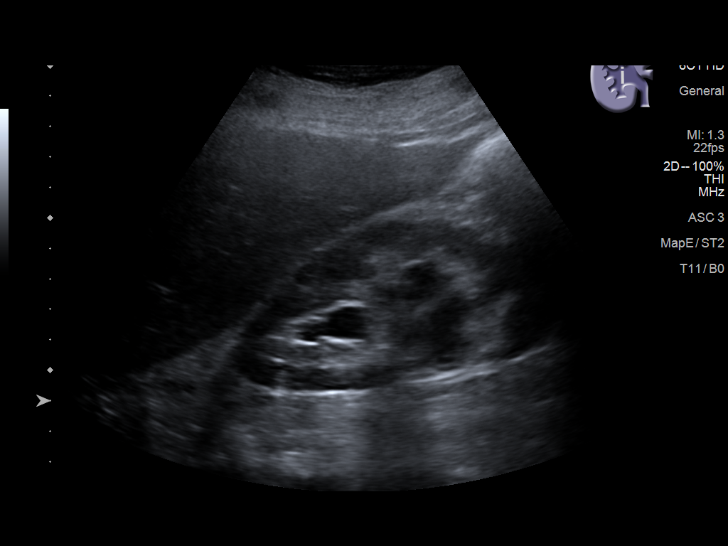
[im 9/49]
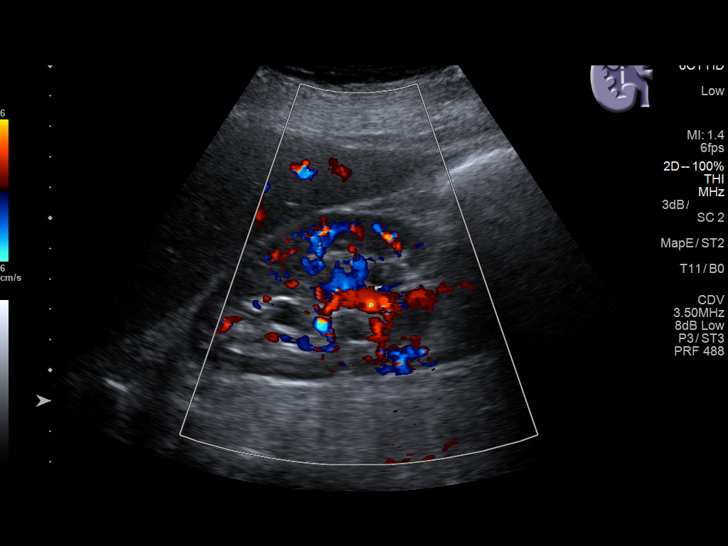
[im 13/49]
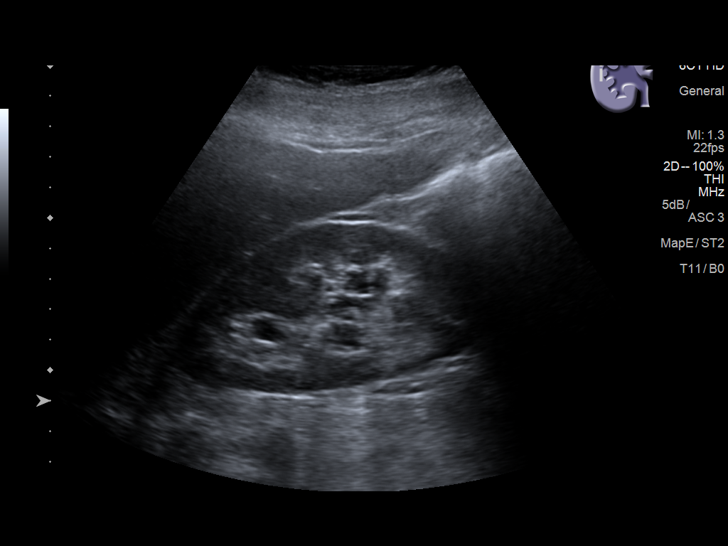
[im 17/49]
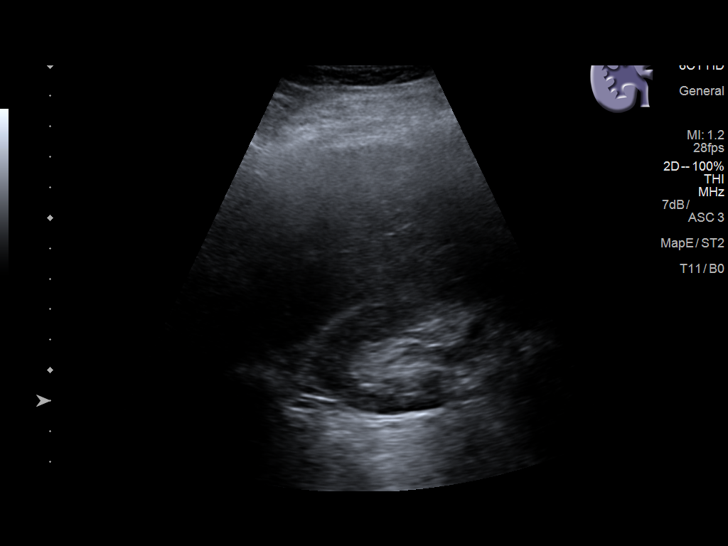
[im 19/49]
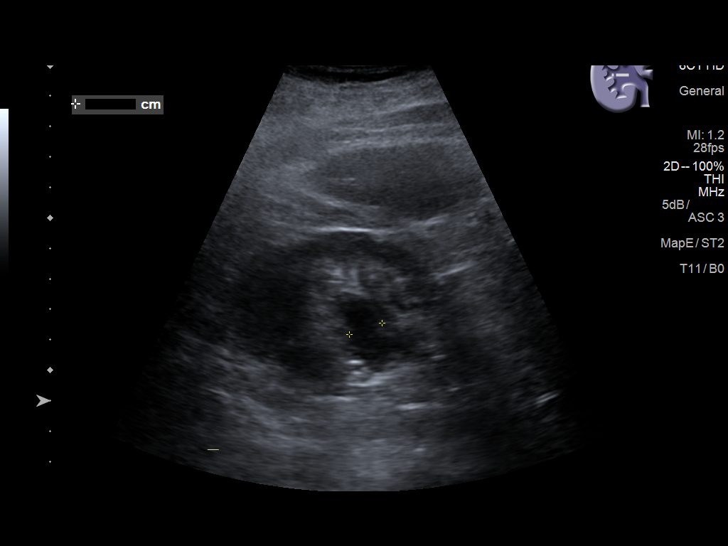
[im 23/49]
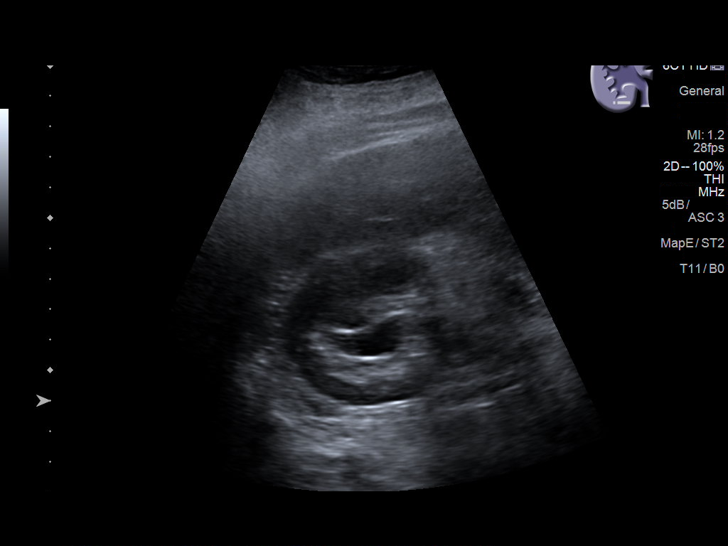
[im 27/49]
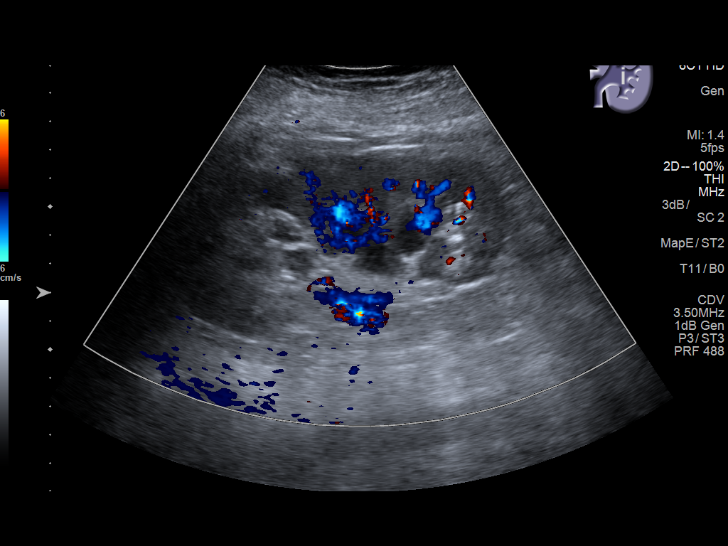
[im 31/49]
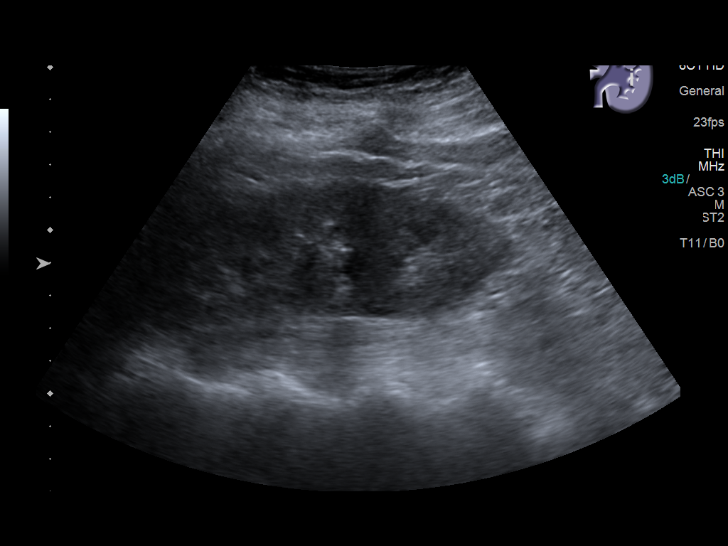
[im 33/49]
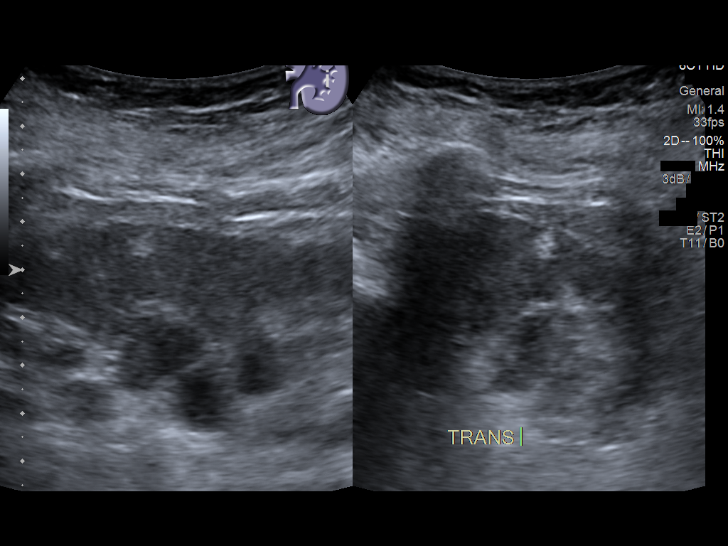
[im 37/49]
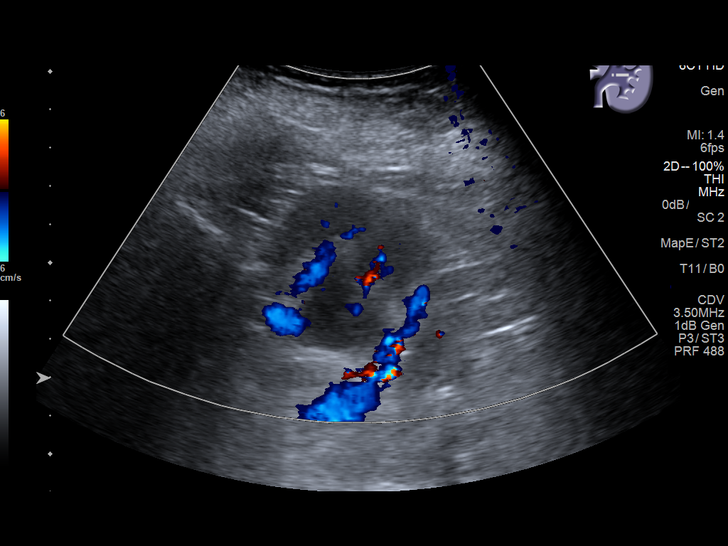
[im 41/49]
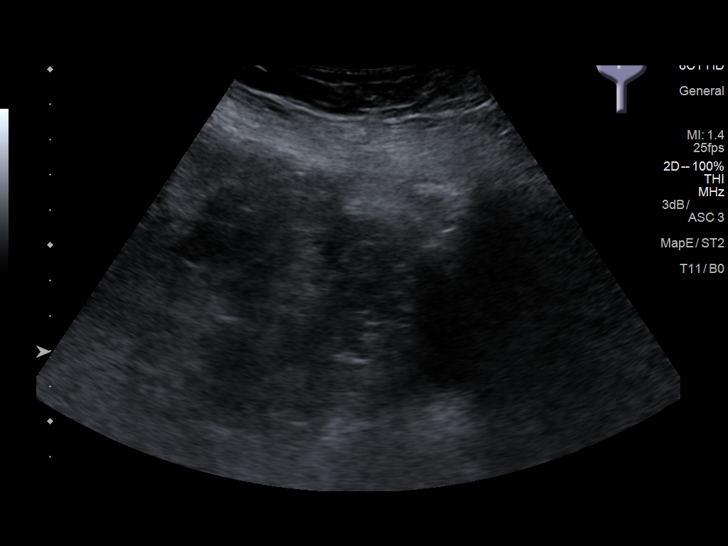
[im 45/49]
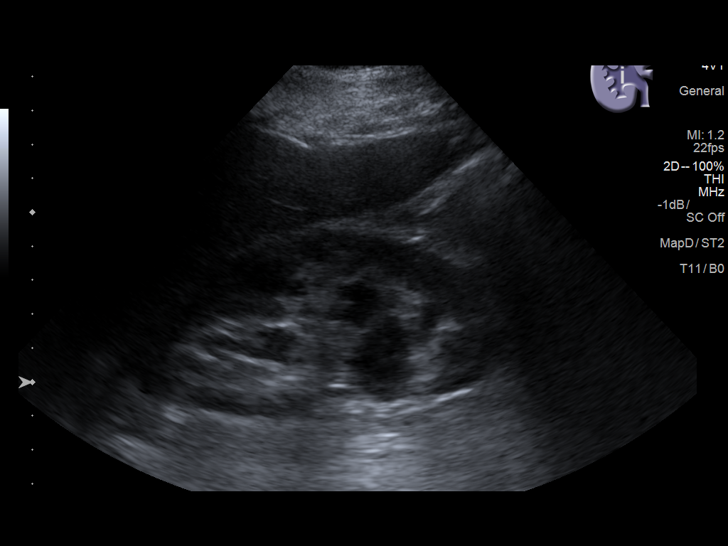
[im 49/49]
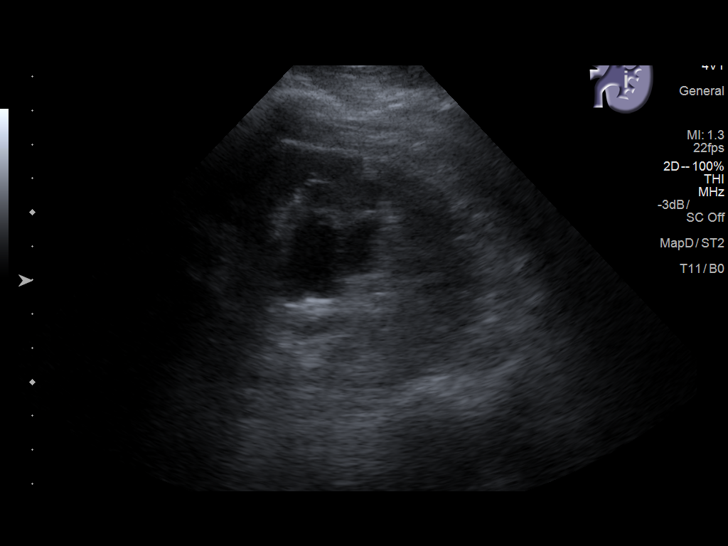

[14 of 25 positions shown; findings below may reference images not displayed]

FINDINGS: Right Kidney:

Length: 11.3 cm. Multiple parapelvic cysts are noted with the
largest measuring 2 cm. Echogenicity within normal limits. No mass
or hydronephrosis visualized.

Left Kidney:

Length: 12 cm. Multiple parapelvic cysts are noted with the largest
measuring 2.5 cm. Echogenicity within normal limits. No
hydronephrosis visualized. 6 mm hyperechoic focus is noted in
midpole cortex most consistent with renal angiomyolipoma.

Bladder:

Appears normal for degree of bladder distention.
IMPRESSION: Multiple parapelvic simple cysts are noted bilaterally. Probable 6
mm left renal angiomyolipoma is noted. No hydronephrosis or renal
obstruction is noted.

## 2020-03-14 MED ORDER — AMOXICILLIN-POT CLAVULANATE 875-125 MG PO TABS: 1.0000 | ORAL_TABLET | Freq: Two times a day (BID) | ORAL | 0 refills | Status: DC

## 2020-04-06 DIAGNOSIS — Z01818 Encounter for other preprocedural examination: Secondary | ICD-10-CM | POA: Diagnosis not present

## 2020-04-07 ENCOUNTER — Telehealth: Payer: Self-pay

## 2020-04-07 NOTE — Telephone Encounter (Signed)
Received call from Melissa Anderson requesting her CT scan and gyn onc follow up be moved from late October to 10/6 due to her moving scheduling. Message sent to scheduling.

## 2020-04-14 DIAGNOSIS — M2041 Other hammer toe(s) (acquired), right foot: Secondary | ICD-10-CM | POA: Diagnosis not present

## 2020-04-14 DIAGNOSIS — M2042 Other hammer toe(s) (acquired), left foot: Secondary | ICD-10-CM | POA: Diagnosis not present

## 2020-04-19 DIAGNOSIS — M2041 Other hammer toe(s) (acquired), right foot: Secondary | ICD-10-CM | POA: Diagnosis not present

## 2020-04-19 DIAGNOSIS — M2042 Other hammer toe(s) (acquired), left foot: Secondary | ICD-10-CM | POA: Diagnosis not present

## 2020-05-04 DIAGNOSIS — C5700 Malignant neoplasm of unspecified fallopian tube: Secondary | ICD-10-CM | POA: Diagnosis not present

## 2020-05-10 DIAGNOSIS — M2041 Other hammer toe(s) (acquired), right foot: Secondary | ICD-10-CM | POA: Diagnosis not present

## 2020-05-10 DIAGNOSIS — M2042 Other hammer toe(s) (acquired), left foot: Secondary | ICD-10-CM | POA: Diagnosis not present

## 2020-06-01 ENCOUNTER — Other Ambulatory Visit: Payer: Self-pay

## 2020-06-01 ENCOUNTER — Ambulatory Visit
Admission: RE | Admit: 2020-06-01 | Discharge: 2020-06-01 | Disposition: A | Discharge status: Home / Self Care | Payer: Medicare Other | Source: Ambulatory Visit | Attending: Obstetrics and Gynecology | Admitting: Obstetrics and Gynecology

## 2020-06-01 DIAGNOSIS — Z90710 Acquired absence of both cervix and uterus: Secondary | ICD-10-CM | POA: Diagnosis not present

## 2020-06-01 DIAGNOSIS — Z8543 Personal history of malignant neoplasm of ovary: Secondary | ICD-10-CM | POA: Diagnosis not present

## 2020-06-01 DIAGNOSIS — N281 Cyst of kidney, acquired: Secondary | ICD-10-CM | POA: Diagnosis not present

## 2020-06-01 DIAGNOSIS — J984 Other disorders of lung: Secondary | ICD-10-CM | POA: Diagnosis not present

## 2020-06-01 DIAGNOSIS — I700 Atherosclerosis of aorta: Secondary | ICD-10-CM | POA: Diagnosis not present

## 2020-06-01 DIAGNOSIS — C569 Malignant neoplasm of unspecified ovary: Secondary | ICD-10-CM | POA: Diagnosis not present

## 2020-06-01 LAB — POCT I-STAT CREATININE: Creatinine, Ser: 0.8 mg/dL (ref 0.44–1.00)

## 2020-06-01 MED ORDER — IOHEXOL 300 MG/ML  SOLN
100.0000 mL | Freq: Once | INTRAMUSCULAR | Status: AC | PRN
  Administered 2020-06-01: 100 mL via INTRAVENOUS

## 2020-06-02 ENCOUNTER — Inpatient Hospital Stay: Payer: Medicare Other | Attending: Obstetrics and Gynecology | Admitting: Obstetrics and Gynecology

## 2020-06-02 ENCOUNTER — Ambulatory Visit: Payer: Medicare Other

## 2020-06-02 ENCOUNTER — Inpatient Hospital Stay: Payer: Medicare Other

## 2020-06-02 ENCOUNTER — Other Ambulatory Visit: Payer: Medicare Other

## 2020-06-02 VITALS — BP 130/72 | HR 65 | Resp 18 | Wt 186.5 lb

## 2020-06-02 DIAGNOSIS — N3281 Overactive bladder: Secondary | ICD-10-CM | POA: Diagnosis not present

## 2020-06-02 DIAGNOSIS — Z79899 Other long term (current) drug therapy: Secondary | ICD-10-CM | POA: Diagnosis not present

## 2020-06-02 DIAGNOSIS — E039 Hypothyroidism, unspecified: Secondary | ICD-10-CM | POA: Diagnosis not present

## 2020-06-02 DIAGNOSIS — Z9221 Personal history of antineoplastic chemotherapy: Secondary | ICD-10-CM | POA: Insufficient documentation

## 2020-06-02 DIAGNOSIS — E785 Hyperlipidemia, unspecified: Secondary | ICD-10-CM | POA: Insufficient documentation

## 2020-06-02 DIAGNOSIS — I890 Lymphedema, not elsewhere classified: Secondary | ICD-10-CM | POA: Diagnosis not present

## 2020-06-02 DIAGNOSIS — Z87440 Personal history of urinary (tract) infections: Secondary | ICD-10-CM | POA: Diagnosis not present

## 2020-06-02 DIAGNOSIS — Z90722 Acquired absence of ovaries, bilateral: Secondary | ICD-10-CM | POA: Diagnosis not present

## 2020-06-02 DIAGNOSIS — C561 Malignant neoplasm of right ovary: Secondary | ICD-10-CM

## 2020-06-02 DIAGNOSIS — Z7982 Long term (current) use of aspirin: Secondary | ICD-10-CM | POA: Diagnosis not present

## 2020-06-02 DIAGNOSIS — E559 Vitamin D deficiency, unspecified: Secondary | ICD-10-CM | POA: Insufficient documentation

## 2020-06-02 DIAGNOSIS — Z8543 Personal history of malignant neoplasm of ovary: Secondary | ICD-10-CM

## 2020-06-02 DIAGNOSIS — E0500 Thyrotoxicosis with diffuse goiter without thyrotoxic crisis or storm: Secondary | ICD-10-CM | POA: Insufficient documentation

## 2020-06-02 DIAGNOSIS — C569 Malignant neoplasm of unspecified ovary: Secondary | ICD-10-CM | POA: Insufficient documentation

## 2020-06-02 DIAGNOSIS — Z87891 Personal history of nicotine dependence: Secondary | ICD-10-CM

## 2020-06-02 DIAGNOSIS — M1990 Unspecified osteoarthritis, unspecified site: Secondary | ICD-10-CM | POA: Insufficient documentation

## 2020-06-02 DIAGNOSIS — Z90710 Acquired absence of both cervix and uterus: Secondary | ICD-10-CM | POA: Insufficient documentation

## 2020-06-02 NOTE — Progress Notes (Signed)
[name]  [phone] [phone]  Patient Care Team: McLean-Scocuzza, Nino Glow, MD as PCP - General (Internal Medicine) Gillis Ends, MD as Referring Physician (Obstetrics and Gynecology) Bary Castilla Forest Gleason, MD as Surgeon (General Surgery) Sindy Guadeloupe, MD as Consulting Physician (Oncology) Clent Jacks, RN as Oncology Nurse Navigator   Name of the patient: Melissa Anderson  [id number]  02-02-1941   Date of visit: 06/02/2020  Gynecologic Oncology Interval Visit   Referring Provider: Dr Mike Gip  Chief Complaint: stage II ovarian cancer  Subjective:  Melissa Anderson is a 79 y.o. 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   CA 125 02/11/20- 16 Today's result is pending. CA 125 was not elevated at time of diagnosis. She had CT on 06/01/20 for surveillance which was negative for metastatic disease. Residual basilar lung scarring.    Gynecologic Oncology History:  She was at her PCP office in March and was complaining of intermittent weakness and pain shooting down her right leg. Her PCP ordered an MRI of her lumbosacral spine which demonstrated this right adnexal mass as an incidental finding.   Radiologic Imaging: 11/12/2017 Lumbosacral spine MRI scan noted a multicystic 4 x 5.5 cm right adnexal mass with fluid blood levels. Prominent uterine endometrial stripe.   12/20/2017 Pelvic Ultrasound: Uterus measures 9.7 x 4.1 x 5.9cm and is retroverted. Endometrial stripe measures 9 mm. Within it, as outlines by a small amount of fluid, there is a 1.4 x 0.7 x 1.4 cm polypoid echogenic structure with internal blood flow and small cystic  components. A similar appearing but smaller 5 x 4 x 3 mm echogenic nodular density is noted within the endometrial cavity as well. The uterus is otherwise normal in appearance. There is a multiloculated complex cystic structure in the right adnexa measuring 6.3 x 6.1 x 4.7 cm. This has solid appearing components as well as anechoic portions, separated by thick septations. On MRI, many of these loculations demonstrate fluid-filled levels. No normal ovarian parenchyma is identified. The left ovary was not seen. There is no pelvic free fluid.  She subsequently underwent an EMB with her gynecologist and this was negative.   She apparently was not aware that she had a cyst this size until she was seen by her provider who recommended Gyn Oncology consultation for further evaluation. She was seen by Dr. Carlton Adam who recommended surgery and further imaging and tumor markers.   02/05/2018: CA 125 = 18, CA 19-9 = 8, CEA <0.5, OVA1 = 4 (low risk)  02/09/2018 CT Scan A/P: multi-septated complex cystic lesion noted in the right pelvis.adnexa immediately superior to the uterus which measures approximately 6.9 x 5.3 am in greatest axial dimensions and approximately 5.4 cm in greatest cranial caudal dimensions.  Abnormal thickening of the endometrial cavity which measures 1.3 cm in greatest width.  Multiple peripelvic cysts are identified in bilateral renal hilar regions measuring up to 3.6 cm on the right and 2.9 cm on the left.  On 02/15/18, she underwent TLH, BSO, with bilateral pelvic LN dissection, peritoneal biopsies, and omental biopsy with Dr. Unknown Foley at [facility]. No spread beyond tube and ovaries.  She was discharged on POD1 w/o complication.    Pathology A. 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  See the  synoptic report. Note: The tumor is positive for PAX8, WT1, and p53 (strong, uniform). Based on the presence of tubal intraepithelial carcinoma, this is considered a tubal primary.  B.  Endometrium, curettage: Fragments of endometrial polyp.  C. Uterus, total hysterectomy:   Uterus:    Endometrium: Endometrial polyp with focal glandular crowding, see note.    Myometrium: Adenomyosis, leiomyomata (up to 1 cm).    Cervix: No pathologic diagnosis.     Serosa: No pathologic diagnosis.  Note: The areas of endometrial glandular crowding are not cytologically distinct from the background and therefore do not meet the criteria for endometrial intraepithelial neoplasia (EIN).  D. Right pelvic lymph nodes, lymphadenectomy: Three lymph nodes, negative for malignancy (0/3).   E. Left pelvic lymph nodes, lymphadenectomy:   Three lymph nodes, negative for malignancy (0/3).   F. Omentum, omentectomy:  Benign omentum.  G. Peritoneal biopsy #1:  Benign fibromuscular tissue.  H. Peritoneal biopsy #2: Benign fibrous tissue.  I. Peritoneal biopsy #3:  Benign fibroadipose tissue.  J. Peritoneal biopsy #4:  Benign fibroadipose tissue.  A. Pelvic Washings: - Negative. No Evidence of Malignancy  03/21/18. She was referred to Dr. Janese Banks, medical-oncology at [facility] and initiated cycle 1 of adjuvant carbo-Taxol on 03/21/18.   05/28/2018 Patient received cycle 4 of chemotherapy on . After 4 cycles of chemotherapy patient developed progressive weakness in her bilateral lower extremities and was admitted to the hospital in [place] where she developed worsening respiratory failure and had to be intubated was airlifted to [place].  Patient was eventually extubated.  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  She was given IVIG as well.  Of note she has received flu shot  consistently in the past and has never had these complications ever. Patient was then sent to a neuro rehab unit and was eventually discharged home.   08/16/2018 - CT C/A/P IMPRESSION: 1. Status post hysterectomy and bilateral salpingo oophorectomy. No findings for metastatic ovarian cancer. No peritoneal surface or omental disease or abdominal/pelvic lymphadenopathy. 2. Mild chronic scarring changes in the lungs but no findings worrisome for pulmonary metastatic disease.  She has had her covid vaccines without complication.   CA125 10/09/2018 16.9   Genetic Testing:  - [employer] 83 gene Multi-Cancer panel was negative for pathogenic mutation in any genes. No VUS detected.   Problem List: Patient Active Problem List   Diagnosis Date Noted  . 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  . 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 History of ovarian cancer 02/11/2020  . Acute pain of right shoulder 08/15/2018  . Guillain Barr syndrome ([place]) 06/26/2018  . Vitamin D deficiency 05/08/2018  . Neuropathy 05/08/2018  . Overactive bladder 05/08/2018  . Bilateral leg edema 05/08/2018  . HLD (hyperlipidemia) 05/08/2018  . Goals of care, counseling/discussion 02/25/2018  . Ovarian cancer ([place]) 02/22/2018  . Graves' disease 02/13/2018    Past Medical History: Past Medical History:  Diagnosis Date  . Arthritis    right shoulder  . Cancer ([place]) 2019   b/l ovaries and left fallopian tube pelvic lymph nodes negative   . Guillain Barr syndrome ([place])    2/2 flu shot 05/2018,  intubated x 8 days  .  Hyperlipidemia   . Overactive bladder   . Thyroid disease   . UTI (urinary tract infection)   . Vitamin D deficiency     Past Surgical History: Past Surgical History:  Procedure Laterality Date  . ABDOMINAL HYSTERECTOMY     02/15/18   . CHOLECYSTECTOMY  2008  . LAPAROSCOPIC VAGINAL HYSTERECTOMY WITH SALPINGO OOPHORECTOMY  02/15/2018  . PORTACATH PLACEMENT Left 02/25/2018   Procedure:  INSERTION PORT-A-CATH;  Surgeon: Robert Bellow, MD;  Location: [facility] ORS;  Service: General;  Laterality: Left;  . TOTAL ABDOMINAL HYSTERECTOMY W/ BILATERAL SALPINGOOPHORECTOMY  02/15/2018   lymph nodes removed    OB History:  OB History  Gravida Para Term Preterm AB Living  2         2  SAB TAB Ectopic Multiple Live Births               # Outcome Date GA Lbr Len/2nd Weight Sex Delivery Anes PTL Lv  2 Gravida           1 Gravida             Obstetric Comments  Menstrual age: 54    Age 1st Pregnancy: 51    Family History: Family History  Problem Relation Age of Onset  . Sudden death Mother 49       drowning in hurricane Katrina  . Pancreatic cancer Father        deceased early 97s; smoker  . Alcohol abuse Father   . COPD Father   . Coronary artery disease Brother   . Melanoma Sister        deceased at 33  . COPD Sister   . Prostate cancer Maternal Uncle        deceased 19  . Breast cancer Paternal Aunt        unsure of this dx    Social History: Social History   Socioeconomic History  . Marital status: Widowed    Spouse name: Not on file  . Number of children: Not on file  . Years of education: Not on file  . Highest education level: Not on file  Occupational History  . Not on file  Tobacco Use  . Smoking status: Former Smoker    Packs/day: 1.00    Years: 25.00    Pack years: 25.00    Types: Cigarettes    Quit date: 02/26/1984    Years since quitting: 36.2  . Smokeless tobacco: Never Used  . Tobacco comment: age 30 to 9 1 ppd   Vaping Use  . Vaping Use: Never used  Substance and Sexual Activity  . Alcohol use: Never  . Drug use: Never  . Sexual activity: Not Currently  Other Topics Concern  . Not on file  Social History Narrative   Engineer, maintenance (IT), bachelors degree    Lives in [place] here visiting daughter [name]    Former smoker quit age 79 y.o    Social Determinants of Health   Financial Resource Strain:   . Difficulty of Paying Living  Expenses: Not on file  Food Insecurity:   . Worried About Charity fundraiser in the Last Year: Not on file  . Ran Out of Food in the Last Year: Not on file  Transportation Needs:   . Lack of Transportation (Medical): Not on file  . Lack of Transportation (Non-Medical): Not on file  Physical Activity:   . Days of Exercise per Week: Not on file  . Minutes of Exercise  per Session: Not on file  Stress:   . Feeling of Stress : Not on file  Social Connections:   . Frequency of Communication with Friends and Family: Not on file  . Frequency of Social Gatherings with Friends and Family: Not on file  . Attends Religious Services: Not on file  . Active Member of Clubs or Organizations: Not on file  . Attends Archivist Meetings: Not on file  . Marital Status: Not on file  Intimate Partner Violence:   . Fear of Current or Ex-Partner: Not on file  . Emotionally Abused: Not on file  . Physically Abused: Not on file  . Sexually Abused: Not on file    Allergies: Allergies  Allergen Reactions  . Crestor [Rosuvastatin]     Myopathy    . Epinephrine Other (See Comments)    Funny feeling, heart racing   . Influenza Vaccines     ?GBS 05/2018   . Lipitor [Atorvastatin Calcium]     Myalgias      Current Medications: Current Outpatient Medications  Medication Sig Dispense Refill  . amoxicillin-clavulanate (AUGMENTIN) 875-125 MG tablet Take 1 tablet by mouth 2 (two) times daily. With food 14 tablet 0  . aspirin EC 81 MG tablet Take 81 mg by mouth daily.    Marland Kitchen gabapentin (NEURONTIN) 300 MG capsule Take 300 mg by mouth 2 (two) times daily.     Marland Kitchen oxybutynin (DITROPAN-XL) 10 MG 24 hr tablet Take 10 mg by mouth daily.    Marland Kitchen SYNTHROID 175 MCG tablet Take 1 tablet (175 mcg total) by mouth daily. 90 tablet 3   No current facility-administered medications for this visit.   Review of Systems General:  no complaints Skin: no complaints Eyes: no complaints HEENT: no  complaints Breasts: no complaints Pulmonary: no complaints Cardiac: lower extremity swelling Gastrointestinal: no complaints Genitourinary/Sexual: no complaints Ob/Gyn: no complaints Musculoskeletal: no complaints Hematology: no complaints Neurologic/Psych: peripheral neuropathy   Objective:  Physical Examination: BP 130/72   Pulse 65   Resp 18   Wt 186 lb 8 oz (84.6 kg)   SpO2 98%   BMI 30.10 kg/m    ECOG Performance Status: 1 - Symptomatic but completely ambulatory  GENERAL: Patient is a well appearing female in no acute distress HEENT:  Sclera clear. Anicteric NODES:  Negative axillary, supraclavicular, inguinal lymph node survery LUNGS:  Clear to auscultation bilaterally.   HEART:  Regular rate and rhythm.  ABDOMEN:  Soft, nontender.  No hernias, incisions well healed. No masses or ascites EXTREMITIES:   Atraumatic. No cyanosis. 1+ bilateral edema, + varicosities, decreased discrimination to light touch & sharp. Decreased proprioception left great toe.  SKIN:  Clear with no obvious rashes or skin changes.  NEURO:  Nonfocal. Well oriented.  Appropriate affect.  Pelvic: Exam chaperoned by nursing.  EGBUS: no lesions Cervix: surgically absent Vagina: no lesions, no discharge or bleeding Uterus: surgically absent BME: no palpable masses Rectovaginal: deferred      Assessment:  Melissa Anderson is a 79 y.o. 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 Unknown Foley. She has initiated chemotherapy 03/21/2018 with carbo-Taxol and is treated by Dr. Janese Banks at [facility]. Chemotherapy discontinued after 05/28/2018 due to GBS from flu shot. Clinically NED. Radiographically NED. CA 125 pending.   Peripheral neuropathy.   Symptomatic bilateral lymphedema, possibly secondary to surgery.   She has completed germline genetic  testing which was negative for  mutation.   Medical co-morbidities complicating care: BMI of 29.41 Plan:   1. Malignant neoplasm of right ovary (HCC)  CA 125 pending today. CT scan was independently reviewed by myself and I agree with findings of no evidence of recurrent or metastatic disease. We again reviewed that recurrence risk may range from 25-30% based on the literature. CA 125 was not elevated at time of diagnosis and therefore we recommended surveillance imaging every 6 months for first two years then reassessing interval thereafter. Next CT scan planned for April 2022. Continue to plan for physical exam including pelvic exam every 3-4 months for 3 years then 5 months for until five years, then annually thereafter.   Somatic testing was not recommended at this time because she does not have a BRCA mutation and is not a candidate for maintenance PARP inhibitor after completing primary chemotherapy.  Somatic testing for mutation and HRD can be considered should she have recurrence.   Beckey Rutter, DNP, AGNP-C [place] at [facility] [phone] (clinic)  I personally interviewed and examined the patient. Agreed with the above/below plan of care. I have directly contributed to assessment and plan of care of this patient and educated and discussed with patient and family.  Mellody Drown, MD

## 2020-06-03 DIAGNOSIS — H903 Sensorineural hearing loss, bilateral: Secondary | ICD-10-CM | POA: Diagnosis not present

## 2020-06-03 LAB — CA 125: Cancer Antigen (CA) 125: 18.6 U/mL (ref 0.0–38.1)

## 2020-06-07 DIAGNOSIS — L600 Ingrowing nail: Secondary | ICD-10-CM | POA: Diagnosis not present

## 2020-06-21 ENCOUNTER — Ambulatory Visit: Payer: Medicare Other

## 2020-06-23 ENCOUNTER — Ambulatory Visit: Payer: Medicare Other

## 2020-06-23 ENCOUNTER — Other Ambulatory Visit: Payer: Medicare Other

## 2020-07-12 ENCOUNTER — Other Ambulatory Visit: Payer: Medicare Other

## 2020-07-13 ENCOUNTER — Telehealth: Payer: Self-pay | Admitting: Internal Medicine

## 2020-07-13 NOTE — Telephone Encounter (Signed)
Patient had chest CT on 02/27/2018 showing aortic atherosclerosis;I have flagged this in the appointment for 07/14/2020 in appointment notes. Other Gaps needed TDAP, Dexa , PNA Vaccine, and Flu   [facility] is asking [place] to do this to close this GAP.  70 Aortic Atherosclerosis Report - Aortic Atherosclerosis is visible evidence that a patient has vascular disease. The attached is a list of patients in your practice who have had Aortic Atherosclerosis (I70.0) identified on a radiological exam and have an appointment coming up but this has not been billed out on a medical claim from a PCP or specialist. We ask that you do so on the next office visit your office has with this patient so that the patient may be appropriately risk stratified. Also remember that vascular disease patients fall into the Quality Metric for needing Statin therapy for cardiovascular disease (SPC.)

## 2020-07-14 ENCOUNTER — Encounter: Payer: Self-pay | Admitting: Internal Medicine

## 2020-07-14 ENCOUNTER — Ambulatory Visit: Payer: Medicare Other | Admitting: Internal Medicine

## 2020-07-14 ENCOUNTER — Telehealth: Payer: Self-pay | Admitting: Internal Medicine

## 2020-07-14 DIAGNOSIS — I700 Atherosclerosis of aorta: Secondary | ICD-10-CM | POA: Insufficient documentation

## 2020-07-14 NOTE — Telephone Encounter (Signed)
Patient no-showed today's appointment; appointment was for 07/14/20, provider notified for review of record. Mychart message sent for patient to re-schedule.

## 2020-09-03 ENCOUNTER — Telehealth: Payer: Self-pay | Admitting: *Deleted

## 2020-09-03 NOTE — Telephone Encounter (Signed)
Patient called reporting that she is due for her mammogram and [place] at [facility] in [place]. Main Number to hospital is [phone]

## 2020-09-06 ENCOUNTER — Telehealth: Payer: Self-pay

## 2020-09-06 NOTE — Telephone Encounter (Signed)
Left message on patient's voicemail that  that provider will not be able to order mammogram for her in [place], and that she would need to get her pcp in [place] to order one for her. Informed her that if she were planning to come to [facility] for the mammogram we could order one for her.

## 2020-09-07 ENCOUNTER — Telehealth: Payer: Self-pay

## 2020-09-07 ENCOUNTER — Ambulatory Visit: Payer: Medicare Other

## 2020-09-07 NOTE — Telephone Encounter (Signed)
Unsuccessful attempt to each patient for scheduled AWV. No answer. Left message with call back number for rescheduling.

## 2020-09-08 ENCOUNTER — Inpatient Hospital Stay: Payer: Medicare Other

## 2020-09-08 ENCOUNTER — Inpatient Hospital Stay: Payer: Medicare Other | Attending: Obstetrics and Gynecology | Admitting: Obstetrics and Gynecology

## 2020-09-08 VITALS — BP 119/58 | HR 80 | Wt 189.0 lb

## 2020-09-08 DIAGNOSIS — Z8543 Personal history of malignant neoplasm of ovary: Secondary | ICD-10-CM

## 2020-09-08 DIAGNOSIS — Z08 Encounter for follow-up examination after completed treatment for malignant neoplasm: Secondary | ICD-10-CM | POA: Diagnosis not present

## 2020-09-08 DIAGNOSIS — Z87891 Personal history of nicotine dependence: Secondary | ICD-10-CM | POA: Insufficient documentation

## 2020-09-08 DIAGNOSIS — Z87440 Personal history of urinary (tract) infections: Secondary | ICD-10-CM | POA: Diagnosis not present

## 2020-09-08 DIAGNOSIS — E559 Vitamin D deficiency, unspecified: Secondary | ICD-10-CM | POA: Insufficient documentation

## 2020-09-08 DIAGNOSIS — C561 Malignant neoplasm of right ovary: Secondary | ICD-10-CM

## 2020-09-08 DIAGNOSIS — Z90710 Acquired absence of both cervix and uterus: Secondary | ICD-10-CM

## 2020-09-08 DIAGNOSIS — I890 Lymphedema, not elsewhere classified: Secondary | ICD-10-CM | POA: Diagnosis not present

## 2020-09-08 DIAGNOSIS — I700 Atherosclerosis of aorta: Secondary | ICD-10-CM | POA: Diagnosis not present

## 2020-09-08 DIAGNOSIS — E039 Hypothyroidism, unspecified: Secondary | ICD-10-CM | POA: Diagnosis not present

## 2020-09-08 DIAGNOSIS — Z90722 Acquired absence of ovaries, bilateral: Secondary | ICD-10-CM | POA: Diagnosis not present

## 2020-09-08 DIAGNOSIS — N3281 Overactive bladder: Secondary | ICD-10-CM | POA: Insufficient documentation

## 2020-09-08 DIAGNOSIS — E785 Hyperlipidemia, unspecified: Secondary | ICD-10-CM | POA: Insufficient documentation

## 2020-09-08 DIAGNOSIS — Z9221 Personal history of antineoplastic chemotherapy: Secondary | ICD-10-CM | POA: Insufficient documentation

## 2020-09-08 DIAGNOSIS — Z79899 Other long term (current) drug therapy: Secondary | ICD-10-CM | POA: Insufficient documentation

## 2020-09-08 NOTE — Progress Notes (Signed)
[facility]  Telephone:([phone] [phone]  Patient Care Team: McLean-Scocuzza, Nino Glow, MD as PCP - General (Internal Medicine) Gillis Ends, MD as Referring Physician (Obstetrics and Gynecology) Bary Castilla [place] Gleason, MD as Surgeon (General Surgery) Sindy Guadeloupe, MD as Consulting Physician (Oncology) Clent Jacks, RN as Oncology Nurse Navigator   Name of the patient: Melissa Anderson  [id number]  06/18/1941   Date of visit: 09/08/2020  Gynecologic Oncology Interval Visit   Referring Provider: Dr Mike Gip  Chief Complaint: stage II ovarian cancer  Subjective:  Melissa Anderson is a 80 y.o. 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   CA 125 (not elevated at time of diagnosis) 06/02/20 18.6 02/11/20 16 10/09/2018 16.9 02/05/2018 18 (diagnosis)   Last imaging was 06/01/20 which was negative for metastatic disease.   She has a h/o prior breast mass and she requested we order her mammogram.    Gynecologic Oncology History:  She was at her PCP office in March and was complaining of intermittent weakness and pain shooting down her right leg. Her PCP ordered an MRI of her lumbosacral spine which demonstrated this right adnexal mass as an incidental finding.   Radiologic Imaging: 11/12/2017 Lumbosacral spine MRI scan noted a multicystic 4 x 5.5 cm right adnexal mass with fluid blood levels. Prominent uterine endometrial stripe.   12/20/2017 Pelvic Ultrasound: Uterus measures 9.7 x 4.1 x 5.9cm and is retroverted. Endometrial stripe measures 9 mm. Within it, as outlines by a small amount of fluid, there is a 1.4 x 0.7 x 1.4 cm polypoid  echogenic structure with internal blood flow and small cystic components. A similar appearing but smaller 5 x 4 x 3 mm echogenic nodular density is noted within the endometrial cavity as well. The uterus is otherwise normal in appearance. There is a multiloculated complex cystic structure in the right adnexa measuring 6.3 x 6.1 x 4.7 cm. This has solid appearing components as well as anechoic portions, separated by thick septations. On MRI, many of these loculations demonstrate fluid-filled levels. No normal ovarian parenchyma is identified. The left ovary was not seen. There is no pelvic free fluid.  She subsequently underwent an EMB with her gynecologist and this was negative.   She apparently was not aware that she had a cyst this size until she was seen by her provider who recommended Gyn Oncology consultation for further evaluation. She was seen by Dr. Carlton Adam who recommended surgery and further imaging and tumor markers.   02/05/2018: CA 125 = 18, CA 19-9 = 8, CEA <0.5, OVA1 = 4 (low risk)  02/09/2018 CT Scan A/P: multi-septated complex cystic lesion noted in the right pelvis.adnexa immediately superior to the uterus which measures approximately 6.9 x 5.3 am in greatest axial dimensions and approximately 5.4 cm in greatest cranial caudal dimensions.  Abnormal thickening of the endometrial cavity which measures 1.3 cm in greatest width.  Multiple peripelvic cysts are identified in bilateral renal hilar regions measuring up to 3.6 cm on the right and 2.9 cm on the left.  On 02/15/18, she underwent TLH, BSO, with bilateral pelvic LN dissection, peritoneal biopsies, and omental biopsy with Dr. Unknown Foley at [facility]. No spread beyond tube and ovaries.  She was discharged on  POD1 w/o complication.    Pathology A. 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  See the synoptic report. Note: The tumor is positive for PAX8, WT1, and p53 (strong, uniform). Based on the presence of tubal intraepithelial carcinoma, this is considered a tubal primary.  B.  Endometrium, curettage: Fragments of endometrial polyp.  C. Uterus, total hysterectomy:   Uterus:    Endometrium: Endometrial polyp with focal glandular crowding, see note.    Myometrium: Adenomyosis, leiomyomata (up to 1 cm).    Cervix: No pathologic diagnosis.     Serosa: No pathologic diagnosis.  Note: The areas of endometrial glandular crowding are not cytologically distinct from the background and therefore do not meet the criteria for endometrial intraepithelial neoplasia (EIN).  D. Right pelvic lymph nodes, lymphadenectomy: Three lymph nodes, negative for malignancy (0/3).   E. Left pelvic lymph nodes, lymphadenectomy: Three lymph nodes, negative for malignancy (0/3).   F. Omentum, omentectomy: Benign omentum.  G. Peritoneal biopsy #1: Benign fibromuscular tissue.  H. Peritoneal biopsy #2: Benign fibrous tissue.  I. Peritoneal biopsy #3: Benign fibroadipose tissue.  J. Peritoneal biopsy #4: Benign fibroadipose tissue.  A. Pelvic Washings: - Negative. No Evidence of Malignancy  03/21/18. She was referred to Dr. Janese Banks, medical-oncology at [facility] and initiated cycle 1 of adjuvant carbo-Taxol on 03/21/18.   05/28/2018 Patient received cycle 4 of chemotherapy on . After 4 cycles of chemotherapy patient developed progressive weakness in her bilateral lower extremities and was admitted to the hospital in [place] where she developed worsening respiratory failure and had to be intubated was airlifted to [place].  Patient was eventually extubated.  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  She was given IVIG  as well.  Of note she has received flu shot consistently in the past and has never had these complications ever. Patient was then sent to a neuro rehab unit and was eventually discharged home.   08/16/2018 - CT C/A/P IMPRESSION: 1. Status post hysterectomy and bilateral salpingo oophorectomy. No findings for metastatic ovarian cancer. No peritoneal surface or omental disease or abdominal/pelvic lymphadenopathy. 2. Mild chronic scarring changes in the lungs but no findings worrisome for pulmonary metastatic disease.  She has had her covid vaccines without complication.     Genetic Testing:  - Invitae 83 gene Multi-Cancer panel was negative for pathogenic mutation in any genes. No VUS detected.   Problem List: Patient Active Problem List   Diagnosis Date Noted  . Aortic atherosclerosis ([place]) 07/14/2020  . 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  . 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 History of ovarian cancer 02/11/2020  . Acute pain of right shoulder 08/15/2018  . 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 Chemotherapy-induced neuropathy ([place]) 05/14/2018  . Vitamin D deficiency 05/08/2018  . Neuropathy 05/08/2018  . Overactive bladder 05/08/2018  . Bilateral leg edema 05/08/2018  . HLD (hyperlipidemia) 05/08/2018  . Goals of care, counseling/discussion 02/25/2018  . Ovarian cancer ([place]) 02/22/2018  . Graves' disease 02/13/2018    Past Medical History: Past Medical History:  Diagnosis Date  . Arthritis    right shoulder  . Cancer ([place]) 2019   b/l ovaries and left fallopian tube pelvic lymph nodes negative   . Guillain Barr syndrome ([place])  2/2 flu shot 05/2018,  intubated x 8 days  . Hyperlipidemia   . Overactive bladder   . Thyroid disease   . UTI (urinary tract infection)   . Vitamin D deficiency     Past Surgical History: Past Surgical History:  Procedure Laterality Date  . ABDOMINAL HYSTERECTOMY     02/15/18   . CHOLECYSTECTOMY  2008  .  LAPAROSCOPIC VAGINAL HYSTERECTOMY WITH SALPINGO OOPHORECTOMY  02/15/2018  . PORTACATH PLACEMENT Left 02/25/2018   Procedure: INSERTION PORT-A-CATH;  Surgeon: Robert Bellow, MD;  Location: [facility] ORS;  Service: General;  Laterality: Left;  . TOTAL ABDOMINAL HYSTERECTOMY W/ BILATERAL SALPINGOOPHORECTOMY  02/15/2018   lymph nodes removed    OB History:  OB History  Gravida Para Term Preterm AB Living  2         2  SAB IAB Ectopic Multiple Live Births               # Outcome Date GA Lbr Len/2nd Weight Sex Delivery Anes PTL Lv  2 Gravida           1 Gravida             Obstetric Comments  Menstrual age: 50    Age 1st Pregnancy: 28    Family History: Family History  Problem Relation Age of Onset  . Sudden death Mother 7       drowning in hurricane Katrina  . Pancreatic cancer Father        deceased early 89s; smoker  . Alcohol abuse Father   . COPD Father   . Coronary artery disease Brother   . Melanoma Sister        deceased at 97  . COPD Sister   . Prostate cancer Maternal Uncle        deceased 80  . Breast cancer Paternal Aunt        unsure of this dx    Social History: Social History   Socioeconomic History  . Marital status: Widowed    Spouse name: Not on file  . Number of children: Not on file  . Years of education: Not on file  . Highest education level: Not on file  Occupational History  . Not on file  Tobacco Use  . Smoking status: Former Smoker    Packs/day: 1.00    Years: 25.00    Pack years: 25.00    Types: Cigarettes    Quit date: 02/26/1984    Years since quitting: 36.5  . Smokeless tobacco: Never Used  . Tobacco comment: age 19 to 77 1 ppd   Vaping Use  . Vaping Use: Never used  Substance and Sexual Activity  . Alcohol use: Never  . Drug use: Never  . Sexual activity: Not Currently  Other Topics Concern  . Not on file  Social History Narrative   Engineer, maintenance (IT), bachelors degree    Lives in [place] here visiting daughter [name]    Former  smoker quit age 48 y.o    Social Determinants of Health   Financial Resource Strain: Not on file  Food Insecurity: Not on file  Transportation Needs: Not on file  Physical Activity: Not on file  Stress: Not on file  Social Connections: Not on file  Intimate Partner Violence: Not on file   Immunization History  Administered Date(s) Administered  . Influenza, High Dose Seasonal PF 05/31/2018  . Influenza-Unspecified 05/13/2018  . PFIZER SARS-COV-2 Vaccination 02/09/2020, 03/02/2020   Allergies: Allergies  Allergen Reactions  .  Crestor [Rosuvastatin]     Myopathy    . Epinephrine Other (See Comments)    Funny feeling, heart racing   . Influenza Vaccines     ?GBS 05/2018   . Lipitor [Atorvastatin Calcium]     Myalgias      Current Medications: Current Outpatient Medications  Medication Sig Dispense Refill  . aspirin EC 81 MG tablet Take 81 mg by mouth daily.    Marland Kitchen oxybutynin (DITROPAN-XL) 10 MG 24 hr tablet Take 10 mg by mouth daily.    Marland Kitchen SYNTHROID 175 MCG tablet Take 1 tablet (175 mcg total) by mouth daily. 90 tablet 3   No current facility-administered medications for this visit.   Review of Systems General:  no complaints Skin: no complaints Eyes: no complaints HEENT: no complaints Breasts: no complaints Pulmonary: no complaints Cardiac: no complaints Gastrointestinal: no complaints Genitourinary/Sexual: no complaints Ob/Gyn: no complaints Musculoskeletal: increasing leg swelling Hematology: no complaints Neurologic/Psych: no complaints   Objective:  Physical Examination: BP (!) 119/58   Pulse 80   Wt 189 lb (85.7 kg)   SpO2 97%   BMI 30.51 kg/m    ECOG Performance Status: 1 - Symptomatic but completely ambulatory  GENERAL: Patient is a well appearing female in no acute distress HEENT:  Sclera clear. Anicteric NODES:  Negative axillary, supraclavicular, inguinal lymph node survery LUNGS:  Clear to auscultation bilaterally.   BREAST: negative for  masses or nodularity HEART:  Regular rate and rhythm.  ABDOMEN:  Soft, nontender.  No hernias, incisions well healed. No masses or ascites EXTREMITIES:  Bilateral 2-3+ peripheral edema. Atraumatic. No cyanosis or cords.  SKIN:  Clear with no obvious rashes or skin changes.  NEURO:  Nonfocal. Well oriented.  Appropriate affect.  Pelvic: Exam chaperoned by CMA EGBUS: no lesions Cervix: surgically absent Vagina: no lesions, no discharge or bleeding Uterus: surgically absent BME: no palpable masses Rectovaginal: deferred      Assessment:  Melissa Anderson is a 80 y.o. 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 Unknown Foley. She has initiated chemotherapy 03/21/2018 with carbo-Taxol and is treated by Dr. Janese Banks at [facility]. Chemotherapy discontinued after 05/28/2018 due to GBS from flu shot. Clinically NED. Radiographically NED. CA 125 pending.   Peripheral neuropathy.   Symptomatic bilateral lymphedema, possibly secondary to surgery.   She has completed germline genetic testing which was negative for mutation.   History of prior breast mass  Medical co-morbidities complicating care: BMI of 29.41 Plan:   1. Malignant neoplasm of right ovary (HCC)  CA 125 pending today. CT scan was independently reviewed by myself and I agree with findings of no evidence of recurrent or metastatic disease. We again reviewed that recurrence risk may range from 25-30% based on the literature. CA 125 was not elevated at time of diagnosis and therefore we recommended surveillance imaging every 6 months for first two years then reassessing interval thereafter. Next CT scan planned for April 2022. Continue to plan for physical exam including pelvic exam every 3-4 months for 3 years then 5 months for until five years, then annually thereafter.   Somatic testing was not recommended at  this time because she does not have a BRCA mutation and is not a candidate for maintenance PARP inhibitor after completing primary chemotherapy.  Somatic testing for mutation and HRD can be considered should she have recurrence.   Recommended that she follow  up with her PCP regarding lymphedema and assess for other causes beyond surgery. We can also discuss PT for lymphedema management.   Follow up breast mammogram.   Beckey Rutter, DNP, AGNP-C [facility] [phone] (clinic)  I personally had a face to face interaction and evaluated the patient jointly with the NP, Ms. Beckey Rutter.  I have reviewed her history and available records and have performed the key portions of the physical exam including lymph node survey, abdominal exam, pelvic exam with my findings confirming those documented above by the APP.  I have discussed the case with the APP and the patient.  I agree with the above documentation, assessment and plan which was fully formulated by me.  Counseling was completed by me.   I personally saw the patient and performed a substantive portion of this encounter in conjunction with the listed APP as documented above.  Domanic Matusek Gaetana Michaelis, MD

## 2020-09-09 ENCOUNTER — Encounter: Payer: Self-pay | Admitting: Internal Medicine

## 2020-09-09 LAB — CA 125: Cancer Antigen (CA) 125: 16.8 U/mL (ref 0.0–38.1)

## 2020-09-15 ENCOUNTER — Telehealth: Payer: Self-pay | Admitting: Nurse Practitioner

## 2020-09-15 DIAGNOSIS — C561 Malignant neoplasm of right ovary: Secondary | ICD-10-CM

## 2020-09-15 NOTE — Telephone Encounter (Signed)
Tumor marker was normal. Patient has not yet heard back from [facility] about scheduling mammogram. Will resend message to Dr. Elroy Channel team to assist with scheduling. Follow up as planned.

## 2020-09-28 ENCOUNTER — Ambulatory Visit (INDEPENDENT_AMBULATORY_CARE_PROVIDER_SITE_OTHER): Payer: Medicare Other | Admitting: Internal Medicine

## 2020-09-28 ENCOUNTER — Ambulatory Visit (INDEPENDENT_AMBULATORY_CARE_PROVIDER_SITE_OTHER): Payer: Medicare Other

## 2020-09-28 ENCOUNTER — Other Ambulatory Visit: Payer: Self-pay

## 2020-09-28 ENCOUNTER — Encounter: Payer: Self-pay | Admitting: Internal Medicine

## 2020-09-28 VITALS — BP 130/80 | HR 101 | Temp 98.0°F | Ht 66.0 in | Wt 187.4 lb

## 2020-09-28 DIAGNOSIS — Z13818 Encounter for screening for other digestive system disorders: Secondary | ICD-10-CM

## 2020-09-28 DIAGNOSIS — E559 Vitamin D deficiency, unspecified: Secondary | ICD-10-CM

## 2020-09-28 DIAGNOSIS — M47816 Spondylosis without myelopathy or radiculopathy, lumbar region: Secondary | ICD-10-CM | POA: Diagnosis not present

## 2020-09-28 DIAGNOSIS — R1030 Lower abdominal pain, unspecified: Secondary | ICD-10-CM | POA: Diagnosis not present

## 2020-09-28 DIAGNOSIS — M25551 Pain in right hip: Secondary | ICD-10-CM

## 2020-09-28 DIAGNOSIS — R269 Unspecified abnormalities of gait and mobility: Secondary | ICD-10-CM | POA: Diagnosis not present

## 2020-09-28 DIAGNOSIS — G5701 Lesion of sciatic nerve, right lower limb: Secondary | ICD-10-CM

## 2020-09-28 DIAGNOSIS — G629 Polyneuropathy, unspecified: Secondary | ICD-10-CM | POA: Diagnosis not present

## 2020-09-28 DIAGNOSIS — M79604 Pain in right leg: Secondary | ICD-10-CM | POA: Diagnosis not present

## 2020-09-28 DIAGNOSIS — R1031 Right lower quadrant pain: Secondary | ICD-10-CM

## 2020-09-28 DIAGNOSIS — M25552 Pain in left hip: Secondary | ICD-10-CM

## 2020-09-28 DIAGNOSIS — N3281 Overactive bladder: Secondary | ICD-10-CM

## 2020-09-28 DIAGNOSIS — Z13220 Encounter for screening for lipoid disorders: Secondary | ICD-10-CM

## 2020-09-28 DIAGNOSIS — I890 Lymphedema, not elsewhere classified: Secondary | ICD-10-CM | POA: Diagnosis not present

## 2020-09-28 DIAGNOSIS — M160 Bilateral primary osteoarthritis of hip: Secondary | ICD-10-CM

## 2020-09-28 DIAGNOSIS — N3000 Acute cystitis without hematuria: Secondary | ICD-10-CM

## 2020-09-28 DIAGNOSIS — E039 Hypothyroidism, unspecified: Secondary | ICD-10-CM

## 2020-09-28 DIAGNOSIS — E2839 Other primary ovarian failure: Secondary | ICD-10-CM | POA: Diagnosis not present

## 2020-09-28 NOTE — Progress Notes (Addendum)
Chief Complaint  Patient presents with  . Leg Pain  . Urinary Incontinence   F/u  1. Urinary incontinence on ditropan xl mg 15 mg qs and still urinating after 2 hours of lying down to rest at night h/o UTI she is drinking caffeinated drinks up until bedtime  2. Right groin, right leg pain 5/10 x months with neuropathy mixed polyneuropathy and MRI lumbar 11/12/2017 with multilevel lumbar arthritis  She tried nerve supplement otc for 9 months w/o help. gabapentin does not help feet feel frozen all the time Neurology Dr. Melrose Nakayama    Review of Systems  Constitutional: Negative for weight loss.  HENT: Negative for hearing loss.   Eyes: Negative for blurred vision.  Respiratory: Negative for shortness of breath.   Cardiovascular: Positive for leg swelling. Negative for chest pain.  Gastrointestinal: Negative for abdominal pain.  Musculoskeletal: Positive for joint pain. Negative for falls.  Skin: Negative for rash.  Neurological: Negative for headaches.  Psychiatric/Behavioral: Negative for memory loss.   Past Medical History:  Diagnosis Date  . Arthritis    right shoulder  . Cancer ([place]) 2019   b/l ovaries and left fallopian tube pelvic lymph nodes negative   . Guillain Barr syndrome ([place])    2/2 flu shot 05/2018,  intubated x 8 days  . Hyperlipidemia   . Overactive bladder   . Thyroid disease   . UTI (urinary tract infection)   . Vitamin D deficiency    Past Surgical History:  Procedure Laterality Date  . ABDOMINAL HYSTERECTOMY     02/15/18   . CHOLECYSTECTOMY  2008  . LAPAROSCOPIC VAGINAL HYSTERECTOMY WITH SALPINGO OOPHORECTOMY  02/15/2018  . PORTACATH PLACEMENT Left 02/25/2018   Procedure: INSERTION PORT-A-CATH;  Surgeon: Robert Bellow, MD;  Location: [facility] ORS;  Service: General;  Laterality: Left;  . TOTAL ABDOMINAL HYSTERECTOMY W/ BILATERAL SALPINGOOPHORECTOMY  02/15/2018   lymph nodes removed   Family History  Problem Relation Age of Onset  . Sudden death Mother 24        drowning in hurricane Katrina  . Pancreatic cancer Father        deceased early 20s; smoker  . Alcohol abuse Father   . COPD Father   . Coronary artery disease Brother   . Melanoma Sister        deceased at 61  . COPD Sister   . Prostate cancer Maternal Uncle        deceased 5  . Breast cancer Paternal Aunt        unsure of this dx   Social History   Socioeconomic History  . Marital status: Widowed    Spouse name: Not on file  . Number of children: Not on file  . Years of education: Not on file  . Highest education level: Not on file  Occupational History  . Not on file  Tobacco Use  . Smoking status: Former Smoker    Packs/day: 1.00    Years: 25.00    Pack years: 25.00    Types: Cigarettes    Quit date: 02/26/1984    Years since quitting: 36.6  . Smokeless tobacco: Never Used  . Tobacco comment: age 72 to 45 1 ppd   Vaping Use  . Vaping Use: Never used  Substance and Sexual Activity  . Alcohol use: Never  . Drug use: Never  . Sexual activity: Not Currently  Other Topics Concern  . Not on file  Social History Stage manager, bachelors degree  Lives in [place] here visiting daughter [name]    Former smoker quit age 79 y.o    Social Determinants of Health   Financial Resource Strain: Not on file  Food Insecurity: Not on file  Transportation Needs: Not on file  Physical Activity: Not on file  Stress: Not on file  Social Connections: Not on file  Intimate Partner Violence: Not on file   Current Meds  Medication Sig  . aspirin EC 81 MG tablet Take 81 mg by mouth daily.  Marland Kitchen oxybutynin (DITROPAN XL) 15 MG 24 hr tablet Take 15 mg by mouth daily at 12 noon.  Marland Kitchen SYNTHROID 175 MCG tablet Take 1 tablet (175 mcg total) by mouth daily.   Allergies  Allergen Reactions  . Crestor [Rosuvastatin]     Myopathy    . Epinephrine Other (See Comments)    Funny feeling, heart racing   . Influenza Vaccines     ?GBS 05/2018   . Lipitor [Atorvastatin  Calcium]     Myalgias     Recent Results (from the past 2160 hour(s))  CA 125     Status: None   Collection Time: 09/08/20  2:54 PM  Result Value Ref Range   Cancer Antigen (CA) 125 16.8 0.0 - 38.1 U/mL    Comment: (NOTE) Roche Diagnostics Electrochemiluminescence Immunoassay (ECLIA) Values obtained with different assay methods or kits cannot be used interchangeably.  Results cannot be interpreted as absolute evidence of the presence or absence of malignant disease. Performed At: [facility] [place], [place] [id number] Rush Farmer MD [id number]    Objective  Body mass index is 30.25 kg/m. Wt Readings from Last 3 Encounters:  09/28/20 187 lb 6.4 oz (85 kg)  09/08/20 189 lb (85.7 kg)  06/02/20 186 lb 8 oz (84.6 kg)   Temp Readings from Last 3 Encounters:  09/28/20 98 F (36.7 C) (Oral)  03/09/20 98.4 F (36.9 C) (Oral)  02/11/20 98.3 F (36.8 C) (Oral)   BP Readings from Last 3 Encounters:  09/28/20 130/80  09/08/20 (!) 119/58  06/02/20 130/72   Pulse Readings from Last 3 Encounters:  09/28/20 (!) 101  09/08/20 80  06/02/20 65    Physical Exam Vitals and nursing note reviewed.  Constitutional:      Appearance: Normal appearance. She is well-developed and well-groomed. She is obese.  HENT:     Head: Normocephalic and atraumatic.  Eyes:     Conjunctiva/sclera: Conjunctivae normal.     Pupils: Pupils are equal, round, and reactive to light.  Cardiovascular:     Rate and Rhythm: Normal rate and regular rhythm.     Heart sounds: Normal heart sounds. No murmur heard.   Pulmonary:     Effort: Pulmonary effort is normal.     Breath sounds: Normal breath sounds.  Abdominal:     Tenderness: There is no abdominal tenderness.  Skin:    General: Skin is warm and dry.  Neurological:     General: No focal deficit present.     Mental Status: She is alert and oriented to person, place, and time. Mental status is at baseline.     Gait:  Gait normal.  Psychiatric:        Attention and Perception: Attention and perception normal.        Mood and Affect: Mood and affect normal.        Speech: Speech normal.        Behavior: Behavior normal. Behavior is cooperative.  Thought Content: Thought content normal.        Cognition and Memory: Cognition and memory normal.        Judgment: Judgment normal.     Assessment  Plan  Abnormal gait ? MSK hip/piriformis syndrome right, neuropathy mixed polyneuropathy (?s/p GBS/after chemo)- Plan: Ambulatory referral to Physical Therapy [facility] B/l hip Xrays  f/u Dr. Melrose Nakayama failed gabapentin consider cymbalta   Overactive bladder r/o UTI- Plan: Urinalysis, Routine w reflex microscopic, Urine Culture rec cut off caffeine to limit to no further beyond mid day   Lymphedema - Plan: Ambulatory referral to Vascular Surgery Dr. 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 Loralyn Freshwater FINDINGS: Single view radiograph the pelvis and two view radiograph of the hips bilaterally demonstrates normal alignment. No acute fracture or dislocation. Severe right and mild left degenerative hip arthritis. Sacroiliac joint spaces are preserved. Soft tissues are unremarkable.  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   Electronically Signed   By: Fidela Salisbury MD   On: 09/29/2020 12:21  HM No further vaccines I.e FLU (had GBS after flu vaccine) due to GBS flu shot had 05/31/18. She had had flu shot prev and no sx's.  Tdap per pt had 5 years ago get record pt to get record  Prevnar, pna 23, shingles vaccines  -pt to track down records.  Pfizer 2/2 not had booster tolerated   Former smoker max 1 ppd from age 35 to 67 no FH lung cancer   Out of age window pap s/p  total hysterectomy 02/15/18 for ovarian cancer with b/l ovaries and left fallopian tube effected negative b/l lymph nodes 3 LN bx'ed   02/06/2019 negative cologaurd -never had colonoscopy  Mammogram per pt had 6 months ago in [place] requested records today  -needs ROI LA before can sch [place]   DEXA ordered to sch with mammogram   rec healthy diet and exercise    Provider: Dr. Olivia Mackie McLean-Scocuzza-Internal Medicine

## 2020-09-28 NOTE — Patient Instructions (Addendum)
Alpha lipoic acid 600 mg 2x per day  Scrambler machine for patients with neuropathy  Call and schedule appt Dr. Melrose Nakayama  Debrox ear wax drops   [facility]  [place], [place] [address]  [phone]  Vickki Hearing, [place]  [facility]  [place], [place] [address]  [phone]  [phone] (Fax)  Hx of Guillain-Barre syndrome (Primary Dx);  Chemotherapy-induced neuropathy (CMS-HCC)   Will refer [facility] and [facility]    Piriformis Syndrome Rehab-? If you have this Ask your health care provider which exercises are safe for you. Do exercises exactly as told by your health care provider and adjust them as directed. It is normal to feel mild stretching, pulling, tightness, or discomfort as you do these exercises. Stop right away if you feel sudden pain or your pain gets worse. Do not begin these exercises until told by your health care provider. Stretching and range-of-motion exercises These exercises warm up your muscles and joints and improve the movement and flexibility of your hip and pelvis. The exercises also help to relieve pain, numbness, and tingling. Hip rotation This is an exercise in which you lie on your back and stretch the muscles that rotate your hip (hip rotators) to stretch your buttocks. 1. Lie on your back on a firm surface. 2. Pull your left / right knee toward your same shoulder with your left / right hand until your knee is pointing toward the ceiling. Hold your left / right ankle with your other hand. 3. Keeping your knee steady, gently pull your left / right ankle toward your other shoulder until you feel a stretch in your buttocks. 4. Hold this position for __________ seconds. Repeat __________ times. Complete this exercise __________ times a day.   Hip extensor This is an exercise in which you lie on your back and pull your knee to your  chest. 1. Lie on your back on a firm surface. Both of your legs should be straight. 2. Pull your left / right knee to your chest. Hold your leg in this position by holding onto the back of your thigh or the front of your knee. 3. Hold this position for __________ seconds. 4. Slowly return to the starting position. Repeat __________ times. Complete this exercise __________ times a day. Strengthening exercises These exercises build strength and endurance in your hip and thigh muscles. Endurance is the ability to use your muscles for a long time, even after they get tired. Straight leg raises, side-lying This exercise strengthens the muscles that rotate the leg at the hip and move it away from your body (hip abductors). 1. Lie on your side with your left / right leg in the top position. Lie so your head, shoulder, knee, and hip line up. Bend your bottom knee to help you balance. 2. Lift your top leg 4-6 inches (10-15 cm) while keeping your toes pointed straight ahead. 3. Hold this position for __________ seconds. 4. Slowly lower your leg to the starting position. 5. Let your muscles relax completely after each repetition. Repeat __________ times. Complete this exercise __________ times a day.   Hip abduction and rotation This is sometimes called quadruped (on hands and knees) exercises. 1. Get on your hands and knees on a firm, lightly padded surface. Your hands should be directly below your shoulders, and your knees should be directly below your hips. 2. Lift your left / right knee out to the  side. Keep your knee bent. Do not twist your body. 3. Hold this position for __________ seconds. 4. Slowly lower your leg. Repeat __________ times. Complete this exercise __________ times a day.   Straight leg raises, face-down This exercise stretches the muscles that move your hips away from the front of the pelvis (hip extensors). 1. Lie on your abdomen on a bed or a firm surface with a pillow under  your hips. 2. Squeeze your buttocks muscles and lift your left / right leg about 4-6 inches (10-15 cm) off the bed. Do not let your back arch. 3. Hold this position for __________ seconds. 4. Slowly lower your leg to the starting position. 5. Let your muscles relax completely after each repetition. Repeat __________ times. Complete this exercise __________ times a day. This information is not intended to replace advice given to you by your health care provider. Make sure you discuss any questions you have with your health care provider. Document Revised: 12/05/2018 Document Reviewed: 06/06/2018 Elsevier Patient Education  2021 [place].    Piriformis Syndrome ???  Piriformis syndrome is a condition that can cause pain and numbness in your buttocks and down the back of your leg. Piriformis syndrome happens when the small muscle that connects the base of your spine to your hip (piriformis muscle) presses on the nerve that runs down the back of your leg (sciatic nerve). The piriformis muscle helps your hip rotate and helps to bring your leg back and out. It also helps shift your weight to keep you stable while you are walking. The sciatic nerve runs under or through the piriformis muscle. Damage to the piriformis muscle can cause spasms that put pressure on the nerve below. This causes pain and discomfort while sitting and moving. The pain may feel as if it begins in the buttock and spreads (radiates) down your hip and thigh. What are the causes? This condition is caused by pressure on the sciatic nerve from the piriformis muscle. The piriformis muscle can get irritated with overuse, especially if other hip muscles are weak and the piriformis muscle has to do extra work. Piriformis syndrome can also occur after an injury, like a fall onto your buttocks. What increases the risk? You are more likely to develop this condition if you:  Are a woman.  Sit for long periods of time.  Are a  cyclist.  Have weak buttocks muscles (gluteal muscles). What are the signs or symptoms? Symptoms of this condition include:  Pain, tingling, or numbness that starts in the buttock and runs down the back of your leg (sciatica).  Pain in the groin or thigh area. Your symptoms may get worse:  The longer you sit.  When you walk, run, or climb stairs.  When straining to have a bowel movement. How is this diagnosed? This condition is diagnosed based on your symptoms, medical history, and physical exam.  During the exam, your health care provider may: ? Move your leg into different positions to check for pain. ? Press on the muscles of your hip and buttock to see if that increases your symptoms.  You may also have tests, including: ? Imaging tests such as X-rays, MRI, or ultrasound. ? Electromyogram (EMG). This test measures electrical signals sent by your nerves into the muscles. ? Nerve conduction study. This test measures how well electrical signals pass through your nerves. How is this treated? This condition may be treated by:  Stopping all activities that cause pain or make your condition  worse.  Applying ice or using heat therapy.  Taking medicines to reduce pain and swelling.  Taking a muscle relaxer (muscle relaxant) to stop muscle spasms.  Doing range-of-motion and strengthening exercises (physical therapy) as told by your health care provider.  Massaging the area.  Having acupuncture.  Getting an injection of medicine in the piriformis muscle. Your health care provider will choose the medicine based on your condition. He or she may inject: ? An anti-inflammatory medicine (steroid) to reduce swelling. ? A numbing medicine (local anesthetic) to block the pain. ? Botulinum toxin. The toxin blocks nerve impulses to specific muscles to reduce muscle tension. In rare cases, you may need surgery to cut the muscle and release pressure on the nerve if other treatments do  not work. Follow these instructions at home: Activity  Do not sit for long periods. Get up and walk around every 20 minutes or as often as told by your health care provider. ? When driving long distances, make sure to take frequent stops to get up and stretch.  Use a cushion when you sit on hard surfaces.  Do exercises as told by your health care provider.  Return to your normal activities as told by your health care provider. Ask your health care provider what activities are safe for you. Managing pain, stiffness, and swelling  If directed, apply heat to the affected area as often as told by your health care provider. Use the heat source that your health care provider recommends, such as a moist heat pack or a heating pad. ? Place a towel between your skin and the heat source. ? Leave the heat on for 20-30 minutes. ? Remove the heat if your skin turns bright red. This is especially important if you are unable to feel pain, heat, or cold. You may have a greater risk of getting burned.  If directed, put ice on the injured area. ? Put ice in a plastic bag. ? Place a towel between your skin and the bag. ? Leave the ice on for 20 minutes, 2-3 times a day.      General instructions  Take over-the-counter and prescription medicines only as told by your health care provider.  Ask your health care provider if the medicine prescribed to you requires you to avoid driving or using heavy machinery.  You may need to take actions to prevent or treat constipation, such as: ? Drink enough fluid to keep your urine pale yellow. ? Take over-the-counter or prescription medicines. ? Eat foods that are high in fiber, such as beans, whole grains, and fresh fruits and vegetables. ? Limit foods that are high in fat and processed sugars, such as fried or sweet foods.  Keep all follow-up visits as told by your health care provider. This is important. How is this prevented?  Do not sit for longer than 20  minutes at a time. When you sit, choose padded surfaces.  Warm up and stretch before being active.  Cool down and stretch after being active.  Give your body time to rest between periods of activity.  Make sure to use equipment that fits you.  Maintain physical fitness, including: ? Strength. ? Flexibility. Contact a health care provider if:  Your pain and stiffness continue or get worse.  Your leg or hip becomes weak.  You have changes in your bowel function or bladder function. Summary  Piriformis syndrome is a condition that can cause pain, tingling, and numbness in your buttocks and down the  back of your leg.  You may try applying heat or ice to relieve the pain.  Do not sit for long periods. Get up and walk around every 20 minutes or as often as told by your health care provider. This information is not intended to replace advice given to you by your health care provider. Make sure you discuss any questions you have with your health care provider. Document Revised: 12/05/2018 Document Reviewed: 04/10/2018 Elsevier Patient Education  2021 [place].  Duloxetine Delayed-Release Capsules What is this medicine? DULOXETINE (doo LOX e teen) is used to treat depression, anxiety, and different types of chronic pain. This medicine may be used for other purposes; ask your health care provider or pharmacist if you have questions. COMMON BRAND NAME(S): Cymbalta, Creig Hines, Irenka What should I tell my health care provider before I take this medicine? They need to know if you have any of these conditions:  bipolar disorder  glaucoma  high blood pressure  kidney disease  liver disease  seizures  suicidal thoughts, plans or attempt; a previous suicide attempt by you or a family member  take medicines that treat or prevent blood clots  taken medicines called MAOIs like Carbex, Eldepryl, Marplan, Nardil, and Parnate within 14 days  trouble passing urine  an unusual  reaction to duloxetine, other medicines, foods, dyes, or preservatives  pregnant or trying to get pregnant  breast-feeding How should I use this medicine? Take this medicine by mouth with a glass of water. Follow the directions on the prescription label. Do not crush, cut or chew some capsules of this medicine. Some capsules may be opened and sprinkled on applesauce. Check with your doctor or pharmacist if you are not sure. You can take this medicine with or without food. Take your medicine at regular intervals. Do not take your medicine more often than directed. Do not stop taking this medicine suddenly except upon the advice of your doctor. Stopping this medicine too quickly may cause serious side effects or your condition may worsen. A special MedGuide will be given to you by the pharmacist with each prescription and refill. Be sure to read this information carefully each time. Talk to your pediatrician regarding the use of this medicine in children. While this drug may be prescribed for children as young as 37 years of age for selected conditions, precautions do apply. Overdosage: If you think you have taken too much of this medicine contact a poison control center or emergency room at once. NOTE: This medicine is only for you. Do not share this medicine with others. What if I miss a dose? If you miss a dose, take it as soon as you can. If it is almost time for your next dose, take only that dose. Do not take double or extra doses. What may interact with this medicine? Do not take this medicine with any of the following medications:  desvenlafaxine  levomilnacipran  linezolid  MAOIs like Carbex, Eldepryl, Emsam, Marplan, Nardil, and Parnate  methylene blue (injected into a vein)  milnacipran  safinamide  thioridazine  venlafaxine  viloxazine This medicine may also interact with the following medications:  alcohol  amphetamines  aspirin and aspirin-like medicines  certain  antibiotics like ciprofloxacin and enoxacin  certain medicines for blood pressure, heart disease, irregular heart beat  certain medicines for depression, anxiety, or psychotic disturbances  certain medicines for migraine headache like almotriptan, eletriptan, frovatriptan, naratriptan, rizatriptan, sumatriptan, zolmitriptan  certain medicines that treat or prevent blood clots like warfarin,  enoxaparin, and dalteparin  cimetidine  fentanyl  lithium  NSAIDS, medicines for pain and inflammation, like ibuprofen or naproxen  phentermine  procarbazine  rasagiline  sibutramine  St. John's wort  theophylline  tramadol  tryptophan This list may not describe all possible interactions. Give your health care provider a list of all the medicines, herbs, non-prescription drugs, or dietary supplements you use. Also tell them if you smoke, drink alcohol, or use illegal drugs. Some items may interact with your medicine. What should I watch for while using this medicine? Tell your doctor if your symptoms do not get better or if they get worse. Visit your doctor or healthcare provider for regular checks on your progress. Because it may take several weeks to see the full effects of this medicine, it is important to continue your treatment as prescribed by your doctor. This medicine may cause serious skin reactions. They can happen weeks to months after starting the medicine. Contact your healthcare provider right away if you notice fevers or flu-like symptoms with a rash. The rash may be red or purple and then turn into blisters or peeling of the skin. Or, you might notice a red rash with swelling of the face, lips, or lymph nodes in your neck or under your arms. Patients and their families should watch out for new or worsening thoughts of suicide or depression. Also watch out for sudden changes in feelings such as feeling anxious, agitated, panicky, irritable, hostile, aggressive, impulsive,  severely restless, overly excited and hyperactive, or not being able to sleep. If this happens, especially at the beginning of treatment or after a change in dose, call your healthcare provider. You may get drowsy or dizzy. Do not drive, use machinery, or do anything that needs mental alertness until you know how this medicine affects you. Do not stand or sit up quickly, especially if you are an older patient. This reduces the risk of dizzy or fainting spells. Alcohol may interfere with the effect of this medicine. Avoid alcoholic drinks. This medicine can cause an increase in blood pressure. This medicine can also cause a sudden drop in your blood pressure, which may make you feel faint and increase the chance of a fall. These effects are most common when you first start the medicine or when the dose is increased, or during use of other medicines that can cause a sudden drop in blood pressure. Check with your doctor for instructions on monitoring your blood pressure while taking this medicine. Your mouth may get dry. Chewing sugarless gum or sucking hard candy, and drinking plenty of water, may help. Contact your doctor if the problem does not go away or is severe. What side effects may I notice from receiving this medicine? Side effects that you should report to your doctor or health care professional as soon as possible:  allergic reactions like skin rash, itching or hives, swelling of the face, lips, or tongue  anxious  breathing problems  confusion  changes in vision  chest pain  confusion  elevated mood, decreased need for sleep, racing thoughts, impulsive behavior  eye pain  fast, irregular heartbeat  feeling faint or lightheaded, falls  feeling agitated, angry, or irritable  hallucination, loss of contact with reality  high blood pressure  loss of balance or coordination  palpitations  redness, blistering, peeling or loosening of the skin, including inside the  mouth  restlessness, pacing, inability to keep still  seizures  stiff muscles  suicidal thoughts or other mood changes  trouble passing urine or change in the amount of urine  trouble sleeping  unusual bleeding or bruising  unusually weak or tired  vomiting  yellowing of the eyes or skin Side effects that usually do not require medical attention (report to your doctor or health care professional if they continue or are bothersome):  change in sex drive or performance  change in appetite or weight  constipation  dizziness  dry mouth  headache  increased sweating  nausea  tired This list may not describe all possible side effects. Call your doctor for medical advice about side effects. You may report side effects to FDA at 1-800-FDA-1088. Where should I keep my medicine? Keep out of the reach of children and pets. Store at room temperature between 15 and 30 degrees C (59 to 86 degrees F). Get rid of any unused medicine after the expiration date. To get rid of medicines that are no longer needed or have expired:  Take the medicine to a medicine take-back program. Check with your pharmacy or law enforcement to find a location.  If you cannot return the medicine, check the label or package insert to see if the medicine should be thrown out in the garbage or flushed down the toilet. If you are not sure, ask your health care provider. If it is safe to put it in the trash, take the medicine out of the container. Mix the medicine with cat litter, dirt, coffee grounds, or other unwanted substance. Seal the mixture in a bag or container. Put it in the trash. NOTE: This sheet is a summary. It may not cover all possible information. If you have questions about this medicine, talk to your doctor, pharmacist, or health care provider.  2021 Elsevier/Gold Standard (2020-07-01 16:06:16)  Neuropathic Pain Neuropathic pain is pain caused by damage to the nerves that are responsible  for certain sensations in your body (sensory nerves). The pain can be caused by:  Damage to the sensory nerves that send signals to your spinal cord and brain (peripheral nervous system).  Damage to the sensory nerves in your brain or spinal cord (central nervous system). Neuropathic pain can make you more sensitive to pain. Even a minor sensation can feel very painful. This is usually a long-term condition that can be difficult to treat. The type of pain differs from person to person. It may:  Start suddenly (acute), or it may develop slowly and last for a long time (chronic).  Come and go as damaged nerves heal, or it may stay at the same level for years.  Cause emotional distress, loss of sleep, and a lower quality of life. What are the causes? The most common cause of this condition is diabetes. Many other diseases and conditions can also cause neuropathic pain. Causes of neuropathic pain can be classified as:  Toxic. This is caused by medicines and chemicals. The most common cause of toxic neuropathic pain is damage from cancer treatments (chemotherapy).  Metabolic. This can be caused by: ? Diabetes. This is the most common disease that damages the nerves. ? Lack of vitamin B from long-term alcohol abuse.  Traumatic. Any injury that cuts, crushes, or stretches a nerve can cause damage and pain. A common example is feeling pain after losing an arm or leg (phantom limb pain).  Compression-related. If a sensory nerve gets trapped or compressed for a long period of time, the blood supply to the nerve can be cut off.  Vascular. Many blood vessel diseases can cause  neuropathic pain by decreasing blood supply and oxygen to nerves.  Autoimmune. This type of pain results from diseases in which the body's defense system (immune system) mistakenly attacks sensory nerves. Examples of autoimmune diseases that can cause neuropathic pain include lupus and multiple sclerosis.  Infectious. Many  types of viral infections can damage sensory nerves and cause pain. Shingles infection is a common cause of this type of pain.  Inherited. Neuropathic pain can be a symptom of many diseases that are passed down through families (genetic). What increases the risk? You are more likely to develop this condition if:  You have diabetes.  You smoke.  You drink too much alcohol.  You are taking certain medicines, including medicines that kill cancer cells (chemotherapy) or that treat immune system disorders. What are the signs or symptoms? The main symptom is pain. Neuropathic pain is often described as:  Burning.  Shock-like.  Stinging.  Hot or cold.  Itching. How is this diagnosed? No single test can diagnose neuropathic pain. It is diagnosed based on:  Physical exam and your symptoms. Your health care provider will ask you about your pain. You may be asked to use a pain scale to describe how bad your pain is.  Tests. These may be done to see if you have a high sensitivity to pain and to help find the cause and location of any sensory nerve damage. They include: ? Nerve conduction studies to test how well nerve signals travel through your sensory nerves (electrodiagnostic testing). ? Stimulating your sensory nerves through electrodes on your skin and measuring the response in your spinal cord and brain (somatosensory evoked potential).  Imaging studies, such as: ? X-rays. ? CT scan. ? MRI. How is this treated? Treatment for neuropathic pain may change over time. You may need to try different treatment options or a combination of treatments. Some options include:  Treating the underlying cause of the neuropathy, such as diabetes, kidney disease, or vitamin deficiencies.  Stopping medicines that can cause neuropathy, such as chemotherapy.  Medicine to relieve pain. Medicines may include: ? Prescription or over-the-counter pain medicine. ? Anti-seizure  medicine. ? Antidepressant medicines. ? Pain-relieving patches that are applied to painful areas of skin. ? A medicine to numb the area (local anesthetic), which can be injected as a nerve block.  Transcutaneous nerve stimulation. This uses electrical currents to block painful nerve signals. The treatment is painless.  Alternative treatments, such as: ? Acupuncture. ? Meditation. ? Massage. ? Physical therapy. ? Pain management programs. ? Counseling. Follow these instructions at home: Medicines  Take over-the-counter and prescription medicines only as told by your health care provider.  Do not drive or use heavy machinery while taking prescription pain medicine.  If you are taking prescription pain medicine, take actions to prevent or treat constipation. Your health care provider may recommend that you: ? Drink enough fluid to keep your urine pale yellow. ? Eat foods that are high in fiber, such as fresh fruits and vegetables, whole grains, and beans. ? Limit foods that are high in fat and processed sugars, such as fried or sweet foods. ? Take an over-the-counter or prescription medicine for constipation.   Lifestyle  Have a good support system at home.  Consider joining a chronic pain support group.  Do not use any products that contain nicotine or tobacco, such as cigarettes and e-cigarettes. If you need help quitting, ask your health care provider.  Do not drink alcohol.   General instructions  Learn  as much as you can about your condition.  Work closely with all your health care providers to find the treatment plan that works best for you.  Ask your health care provider what activities are safe for you.  Keep all follow-up visits as told by your health care provider. This is important. Contact a health care provider if:  Your pain treatments are not working.  You are having side effects from your medicines.  You are struggling with tiredness (fatigue), mood  changes, depression, or anxiety. Summary  Neuropathic pain is pain caused by damage to the nerves that are responsible for certain sensations in your body (sensory nerves).  Neuropathic pain may come and go as damaged nerves heal, or it may stay at the same level for years.  Neuropathic pain is usually a long-term condition that can be difficult to treat. Consider joining a chronic pain support group. This information is not intended to replace advice given to you by your health care provider. Make sure you discuss any questions you have with your health care provider. Document Revised: 12/05/2018 Document Reviewed: 08/31/2017 Elsevier Patient Education  2021 [employer].

## 2020-09-29 ENCOUNTER — Encounter: Payer: Self-pay | Admitting: Internal Medicine

## 2020-09-29 ENCOUNTER — Other Ambulatory Visit (INDEPENDENT_AMBULATORY_CARE_PROVIDER_SITE_OTHER): Payer: Medicare Other

## 2020-09-29 DIAGNOSIS — G629 Polyneuropathy, unspecified: Secondary | ICD-10-CM

## 2020-09-29 DIAGNOSIS — Z13220 Encounter for screening for lipoid disorders: Secondary | ICD-10-CM

## 2020-09-29 DIAGNOSIS — E039 Hypothyroidism, unspecified: Secondary | ICD-10-CM

## 2020-09-29 DIAGNOSIS — Z13818 Encounter for screening for other digestive system disorders: Secondary | ICD-10-CM | POA: Diagnosis not present

## 2020-09-29 DIAGNOSIS — E559 Vitamin D deficiency, unspecified: Secondary | ICD-10-CM | POA: Diagnosis not present

## 2020-09-29 DIAGNOSIS — N3000 Acute cystitis without hematuria: Secondary | ICD-10-CM

## 2020-09-29 DIAGNOSIS — N3281 Overactive bladder: Secondary | ICD-10-CM | POA: Diagnosis not present

## 2020-09-29 LAB — COMPREHENSIVE METABOLIC PANEL
ALT: 14 U/L (ref 0–35)
AST: 15 U/L (ref 0–37)
Albumin: 4.1 g/dL (ref 3.5–5.2)
Alkaline Phosphatase: 67 U/L (ref 39–117)
BUN: 23 mg/dL (ref 6–23)
CO2: 31 mEq/L (ref 19–32)
Calcium: 9.3 mg/dL (ref 8.4–10.5)
Chloride: 108 mEq/L (ref 96–112)
Creatinine, Ser: 0.72 mg/dL (ref 0.40–1.20)
GFR: 79.01 mL/min (ref >60.00)
Glucose, Bld: 87 mg/dL (ref 70–99)
Potassium: 4.7 mEq/L (ref 3.5–5.1)
Sodium: 143 mEq/L (ref 135–145)
Total Bilirubin: 0.6 mg/dL (ref 0.2–1.2)
Total Protein: 6.3 g/dL (ref 6.0–8.3)

## 2020-09-29 LAB — CBC WITH DIFFERENTIAL/PLATELET
Basophils Absolute: 0.1 10*3/uL (ref 0.0–0.1)
Basophils Relative: 1.5 % (ref 0.0–3.0)
Eosinophils Absolute: 0.2 10*3/uL (ref 0.0–0.7)
Eosinophils Relative: 3.9 % (ref 0.0–5.0)
HCT: 41.5 % (ref 36.0–46.0)
Hemoglobin: 13.8 g/dL (ref 12.0–15.0)
Lymphocytes Relative: 22.1 % (ref 12.0–46.0)
Lymphs Abs: 1.2 10*3/uL (ref 0.7–4.0)
MCHC: 33.1 g/dL (ref 30.0–36.0)
MCV: 93.7 fl (ref 78.0–100.0)
Monocytes Absolute: 0.5 10*3/uL (ref 0.1–1.0)
Monocytes Relative: 9.1 % (ref 3.0–12.0)
Neutro Abs: 3.6 10*3/uL (ref 1.4–7.7)
Neutrophils Relative %: 63.4 % (ref 43.0–77.0)
Platelets: 239 10*3/uL (ref 150.0–400.0)
RBC: 4.43 Mil/uL (ref 3.87–5.11)
RDW: 14.2 % (ref 11.5–15.5)
WBC: 5.7 10*3/uL (ref 4.0–10.5)

## 2020-09-29 LAB — LIPID PANEL
Cholesterol: 220 mg/dL — ABNORMAL HIGH (ref 0–200)
HDL: 57 mg/dL (ref >39.00)
LDL Cholesterol: 143 mg/dL — ABNORMAL HIGH (ref 0–99)
NonHDL: 162.66
Total CHOL/HDL Ratio: 4
Triglycerides: 96 mg/dL (ref 0.0–149.0)
VLDL: 19.2 mg/dL (ref 0.0–40.0)

## 2020-09-29 LAB — TSH: TSH: 1.57 u[IU]/mL (ref 0.35–4.50)

## 2020-09-29 LAB — VITAMIN D 25 HYDROXY (VIT D DEFICIENCY, FRACTURES): VITD: 27.25 ng/mL — ABNORMAL LOW (ref 30.00–100.00)

## 2020-09-30 ENCOUNTER — Other Ambulatory Visit: Payer: Self-pay | Admitting: Nurse Practitioner

## 2020-09-30 ENCOUNTER — Telehealth: Payer: Self-pay | Admitting: Internal Medicine

## 2020-09-30 DIAGNOSIS — R928 Other abnormal and inconclusive findings on diagnostic imaging of breast: Secondary | ICD-10-CM

## 2020-09-30 DIAGNOSIS — N6001 Solitary cyst of right breast: Secondary | ICD-10-CM

## 2020-09-30 DIAGNOSIS — Z1231 Encounter for screening mammogram for malignant neoplasm of breast: Secondary | ICD-10-CM

## 2020-09-30 LAB — HEPATITIS C ANTIBODY
Hepatitis C Ab: NONREACTIVE
SIGNAL TO CUT-OFF: 0.01 (ref <1.00)

## 2020-09-30 LAB — SPECIMEN STATUS REPORT

## 2020-09-30 LAB — URINALYSIS, ROUTINE W REFLEX MICROSCOPIC

## 2020-09-30 NOTE — Telephone Encounter (Signed)
Tiffany from [employer] in. They received an extra urine on the Patient but could only see the order for the urinalysis.  Gave verbal for Urine culture to be added. Verified our fax number so that they may send over an add on authorization form to be signed and faxed back.

## 2020-09-30 NOTE — Addendum Note (Signed)
Addended by: Leeanne Rio on: 09/30/2020 10:57 AM   Modules accepted: Orders

## 2020-10-01 ENCOUNTER — Encounter: Payer: Self-pay | Admitting: Internal Medicine

## 2020-10-01 ENCOUNTER — Telehealth: Payer: Self-pay | Admitting: Internal Medicine

## 2020-10-01 DIAGNOSIS — M160 Bilateral primary osteoarthritis of hip: Secondary | ICD-10-CM | POA: Insufficient documentation

## 2020-10-01 MED ORDER — CIPROFLOXACIN HCL 500 MG PO TABS
500.0000 mg | ORAL_TABLET | Freq: Two times a day (BID) | ORAL | 0 refills | Status: DC
Start: 2020-10-01 — End: 2021-01-05

## 2020-10-01 NOTE — Addendum Note (Signed)
Addended by: Orland Mustard on: 10/01/2020 05:54 PM   Modules accepted: Orders

## 2020-10-01 NOTE — Addendum Note (Signed)
Addended by: Orland Mustard on: 10/01/2020 06:05 PM   Modules accepted: Orders

## 2020-10-01 NOTE — Telephone Encounter (Signed)
All [facility] do not have the antibiotic Cipro so Ive sent this to [facility]   Please pick this up from here    Call her daughter if needed   Thank you

## 2020-10-02 LAB — URINE CULTURE

## 2020-10-04 NOTE — Telephone Encounter (Signed)
Patient informed and verbalized understanding

## 2020-10-13 ENCOUNTER — Telehealth: Payer: Self-pay | Admitting: Internal Medicine

## 2020-10-13 NOTE — Telephone Encounter (Signed)
Left message for patient to call back and schedule Medicare Annual Wellness Visit (AWV)   This should be a virtual visit only=30 minutes.  Last AWV 05/31/18; please schedule at anytime with Denisa O'Brien-Blaney at [facility]

## 2020-10-14 DIAGNOSIS — M25552 Pain in left hip: Secondary | ICD-10-CM | POA: Diagnosis not present

## 2020-10-14 DIAGNOSIS — M25551 Pain in right hip: Secondary | ICD-10-CM | POA: Diagnosis not present

## 2020-10-14 DIAGNOSIS — M160 Bilateral primary osteoarthritis of hip: Secondary | ICD-10-CM | POA: Diagnosis not present

## 2020-10-18 ENCOUNTER — Other Ambulatory Visit: Payer: Self-pay

## 2020-10-18 ENCOUNTER — Encounter (INDEPENDENT_AMBULATORY_CARE_PROVIDER_SITE_OTHER): Payer: Self-pay | Admitting: Vascular Surgery

## 2020-10-18 ENCOUNTER — Ambulatory Visit (INDEPENDENT_AMBULATORY_CARE_PROVIDER_SITE_OTHER): Payer: Medicare Other | Admitting: Vascular Surgery

## 2020-10-18 VITALS — BP 138/76 | HR 70 | Ht 66.0 in | Wt 187.0 lb

## 2020-10-18 DIAGNOSIS — M47816 Spondylosis without myelopathy or radiculopathy, lumbar region: Secondary | ICD-10-CM | POA: Diagnosis not present

## 2020-10-18 DIAGNOSIS — I890 Lymphedema, not elsewhere classified: Secondary | ICD-10-CM | POA: Diagnosis not present

## 2020-10-18 DIAGNOSIS — E785 Hyperlipidemia, unspecified: Secondary | ICD-10-CM

## 2020-10-18 NOTE — Progress Notes (Signed)
MRN : [id number]  JAIA ALONGE is a 80 y.o. (Oct 03, 1940) female who presents with chief complaint of No chief complaint on file. Marland Kitchen  History of Present Illness:   Patient is seen for evaluation of  leg swelling. The patient first noticed the swelling remotely. The swelling is associated with discoloration. The swelling worsens with prolonged dependency and improves with elevation. The patient notes that in the morning the legs are significantly improved but they steadily worsened throughout the course of the day.   The patient denies claudication symptoms.  The patient denies symptoms consistent with rest pain.  The patient denies and extensive history of DJD and LS spine disease.  The patient has not had any past angiography, interventions or vascular surgery.  Elevation makes the leg symptoms better, dependency makes them much worse. There is no history of ulcerations. The patient denies any recent changes in medications.  The patient has not been wearing graduated compression.  The patient denies a history of DVT or PE. There is no prior history of phlebitis. There is no history of primary lymphedema.  No history of malignancies. No history of trauma or groin or pelvic surgery. There is no history of radiation treatment to the groin or pelvis  The patient denies amaurosis fugax or recent TIA symptoms. There are no recent neurological changes noted. The patient denies recent episodes of angina or shortness of breath  No outpatient medications have been marked as taking for the 10/18/20 encounter (Appointment) with Delana Meyer, Dolores Lory, MD.    Past Medical History:  Diagnosis Date  . Arthritis    right shoulder  . 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 Guillain Barr syndrome ([place])    2/2 flu shot 05/2018,  intubated x 8 days  . Hyperlipidemia   . Overactive bladder   . Thyroid disease   . UTI (urinary tract infection)   .  Vitamin D deficiency     Past Surgical History:  Procedure Laterality Date  . ABDOMINAL HYSTERECTOMY     02/15/18   . CHOLECYSTECTOMY  2008  . LAPAROSCOPIC VAGINAL HYSTERECTOMY WITH SALPINGO OOPHORECTOMY  02/15/2018  . PORTACATH PLACEMENT Left 02/25/2018   Procedure: INSERTION PORT-A-CATH;  Surgeon: Robert Bellow, MD;  Location: [facility] ORS;  Service: General;  Laterality: Left;  . TOTAL ABDOMINAL HYSTERECTOMY W/ BILATERAL SALPINGOOPHORECTOMY  02/15/2018   lymph nodes removed    Social History Social History   Tobacco Use  . Smoking status: Former Smoker    Packs/day: 1.00    Years: 25.00    Pack years: 25.00    Types: Cigarettes    Quit date: 02/26/1984    Years since quitting: 36.6  . Smokeless tobacco: Never Used  . Tobacco comment: age 70 to 2 1 ppd   Vaping Use  . Vaping Use: Never used  Substance Use Topics  . Alcohol use: Never  . Drug use: Never    Family History Family History  Problem Relation Age of Onset  . Sudden death Mother 3       drowning in hurricane Katrina  . Pancreatic cancer Father        deceased early 6s; smoker  . Alcohol abuse Father   . COPD Father   . Coronary artery disease Brother   . Melanoma Sister        deceased at 31  . COPD Sister   . Prostate cancer Maternal Uncle  deceased 47  . Breast cancer Paternal Aunt        unsure of this dx  No family history of bleeding/clotting disorders, porphyria or autoimmune disease   Allergies  Allergen Reactions  . Crestor [Rosuvastatin]     Myopathy    . Epinephrine Other (See Comments)    Funny feeling, heart racing   . Influenza Vaccines     ?GBS 05/2018   . Lipitor [Atorvastatin Calcium]     Myalgias       REVIEW OF SYSTEMS (Negative unless checked)  Constitutional: [] Weight loss  [] Fever  [] Chills Cardiac: [] Chest pain   [] Chest pressure   [] Palpitations   [] Shortness of breath when laying flat   [] Shortness of breath with exertion. Vascular:  [] Pain in legs  with walking   [x] Pain in legs at rest  [] History of DVT   [] Phlebitis   [x] Swelling in legs   [] Varicose veins   [] Non-healing ulcers Pulmonary:   [] Uses home oxygen   [] Productive cough   [] Hemoptysis   [] Wheeze  [] COPD   [] Asthma Neurologic:  [] Dizziness   [] Seizures   [] History of stroke   [] History of TIA  [] Aphasia   [] Vissual changes   [] Weakness or numbness in arm   [] Weakness or numbness in leg Musculoskeletal:   [] Joint swelling   [x] Joint pain   [] Low back pain Hematologic:  [] Easy bruising  [] Easy bleeding   [] Hypercoagulable state   [] Anemic Gastrointestinal:  [] Diarrhea   [] Vomiting  [] Gastroesophageal reflux/heartburn   [] Difficulty swallowing. Genitourinary:  [] Chronic kidney disease   [] Difficult urination  [] Frequent urination   [] Blood in urine Skin:  [] Rashes   [] Ulcers  Psychological:  [] History of anxiety   []  History of major depression.  Physical Examination  There were no vitals filed for this visit. There is no height or weight on file to calculate BMI. Gen: WD/WN, NAD Head: [place]/AT, No temporalis wasting.  Ear/Nose/Throat: Hearing grossly intact, nares w/o erythema or drainage, poor dentition Eyes: PER, EOMI, sclera nonicteric.  Neck: Supple, no masses.  No bruit or JVD.  Pulmonary:  Good air movement, clear to auscultation bilaterally, no use of accessory muscles.  Cardiac: RRR, normal S1, S2, no Murmurs. Vascular: scattered varicosities present bilaterally.  Moderate venous stasis changes to the legs bilaterally.  3-4+ soft pitting edema. Vessel Right Left  Radial Palpable Palpable  Gastrointestinal: soft, non-distended. No guarding/no peritoneal signs.  Musculoskeletal: M/S 5/5 throughout.  No deformity or atrophy.  Neurologic: CN 2-12 intact. Pain and light touch intact in extremities.  Symmetrical.  Speech is fluent. Motor exam as listed above. Psychiatric: Judgment intact, Mood & affect appropriate for pt's clinical situation. Dermatologic: Moderate venous  rashes no ulcers noted.  No changes consistent with cellulitis. Lymph : + lichenification or skin changes of chronic lymphedema.  CBC Lab Results  Component Value Date   WBC 5.7 09/29/2020   HGB 13.8 09/29/2020   HCT 41.5 09/29/2020   MCV 93.7 09/29/2020   PLT 239.0 09/29/2020    BMET    Component Value Date/Time   NA 143 09/29/2020 0910   K 4.7 09/29/2020 0910   CL 108 09/29/2020 0910   CO2 31 09/29/2020 0910   GLUCOSE 87 09/29/2020 0910   BUN 23 09/29/2020 0910   CREATININE 0.72 09/29/2020 0910   CALCIUM 9.3 09/29/2020 0910   GFRNONAA >60 08/08/2018 0859   GFRAA >60 08/08/2018 0859   CrCl cannot be calculated (Unknown ideal weight.).  COAG No results found for: INR, PROTIME  Radiology DG HIPS BILAT WITH PELVIS 3-4 VIEWS  Result Date: 09/29/2020 CLINICAL DATA:  Bilateral hip pain EXAM: DG HIP (WITH OR WITHOUT PELVIS) 3-4V BILAT COMPARISON:  None. FINDINGS: Single view radiograph the pelvis and two view radiograph of the hips bilaterally demonstrates normal alignment. No acute fracture or dislocation. Severe right and mild left degenerative hip arthritis. Sacroiliac joint spaces are preserved. Soft tissues are unremarkable. 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 Electronically Signed   By: Fidela Salisbury MD   On: 09/29/2020 12:21     Assessment/Plan 1. Lymphedema Recommend:  No surgery or intervention at this point in time.    I have reviewed my previous discussion with the patient regarding swelling and why it causes symptoms.  Patient will continue wearing graduated compression stockings class 1 (20-30 mmHg) on a daily basis. The patient will  beginning wearing the stockings first thing in the morning and removing them in the evening. The patient is instructed specifically not to sleep in the stockings.    In addition, behavioral modification including several periods of elevation of the lower extremities during the day will be  continued.  This was reviewed with the patient during the initial visit.  The patient will also continue routine exercise, especially walking on a daily basis as was discussed during the initial visit.    Despite conservative treatments including graduated compression therapy class 1 and behavioral modification including exercise and elevation the patient  has not obtained adequate control of the lymphedema.  The patient still has stage 3 lymphedema and therefore, I believe that a lymph pump should be added to improve the control of the patient's lymphedema.  Additionally, a lymph pump is warranted because it will reduce the risk of cellulitis and ulceration in the future.  Patient should follow-up in six months    2. Hyperlipidemia, unspecified hyperlipidemia type Continue antihypertensive medications as already ordered, these medications have been reviewed and there are no changes at this time.   3. Arthritis, lumbar spine Continue NSAID medications as already ordered, these medications have been reviewed and there are no changes at this time.  Continued activity and therapy was stressed.    Hortencia Pilar, MD  10/18/2020 11:04 AM

## 2020-10-19 ENCOUNTER — Ambulatory Visit
Admission: RE | Admit: 2020-10-19 | Discharge: 2020-10-19 | Disposition: A | Discharge status: Home / Self Care | Payer: Medicare Other | Source: Ambulatory Visit | Attending: Nurse Practitioner | Admitting: Nurse Practitioner

## 2020-10-19 ENCOUNTER — Ambulatory Visit
Admission: RE | Admit: 2020-10-19 | Discharge: 2020-10-19 | Disposition: A | Discharge status: Home / Self Care | Payer: Medicare Other | Source: Ambulatory Visit | Attending: Internal Medicine | Admitting: Internal Medicine

## 2020-10-19 DIAGNOSIS — Z1231 Encounter for screening mammogram for malignant neoplasm of breast: Secondary | ICD-10-CM | POA: Insufficient documentation

## 2020-10-19 DIAGNOSIS — N6001 Solitary cyst of right breast: Secondary | ICD-10-CM | POA: Diagnosis not present

## 2020-10-19 DIAGNOSIS — E2839 Other primary ovarian failure: Secondary | ICD-10-CM | POA: Insufficient documentation

## 2020-10-19 DIAGNOSIS — R922 Inconclusive mammogram: Secondary | ICD-10-CM | POA: Diagnosis not present

## 2020-10-19 DIAGNOSIS — R928 Other abnormal and inconclusive findings on diagnostic imaging of breast: Secondary | ICD-10-CM | POA: Diagnosis not present

## 2020-10-19 DIAGNOSIS — M85852 Other specified disorders of bone density and structure, left thigh: Secondary | ICD-10-CM | POA: Diagnosis not present

## 2020-10-20 ENCOUNTER — Ambulatory Visit (INDEPENDENT_AMBULATORY_CARE_PROVIDER_SITE_OTHER): Payer: Medicare Other

## 2020-10-20 ENCOUNTER — Telehealth: Payer: Self-pay | Admitting: Nurse Practitioner

## 2020-10-20 VITALS — Ht 65.75 in | Wt 181.6 lb

## 2020-10-20 DIAGNOSIS — Z0000 Encounter for general adult medical examination without abnormal findings: Secondary | ICD-10-CM | POA: Diagnosis not present

## 2020-10-20 NOTE — Patient Instructions (Addendum)
Melissa Anderson , Thank you for taking time to come for your Medicare Wellness Visit. I appreciate your ongoing commitment to your health goals. Please review the following plan we discussed and let me know if I can assist you in the future.   These are the goals we discussed: Goals      Patient Stated   .  I would like to increase water intake (pt-stated)       This is a list of the screening recommended for you and due dates:  Health Maintenance  Topic Date Due  . COVID-19 Vaccine (3 - Pfizer risk 4-dose series) 10/26/2020*  . Flu Shot  11/25/2020*  . DEXA scan (bone density measurement)  Completed  . Tetanus Vaccine  Discontinued  . Pneumonia vaccines  Discontinued  *Topic was postponed. The date shown is not the original due date.    Immunizations Immunization History  Administered Date(s) Administered  . Influenza, High Dose Seasonal PF 05/31/2018  . Influenza-Unspecified 05/13/2018  . PFIZER(Purple Top)SARS-COV-2 Vaccination 02/09/2020, 03/02/2020   Keep all routine maintenance appointments.   Follow up 12/28/20 @ 8:00  Awaiting physical therapy feedback with [facility]  Advanced directives: on file  Conditions/risks identified: none new  Follow up in one year for your annual wellness visit    Preventive Care 65 Years and Older, Female Preventive care refers to lifestyle choices and visits with your health care provider that can promote health and wellness. What does preventive care include?  A yearly physical exam. This is also called an annual well check.  Dental exams once or twice a year.  Routine eye exams. Ask your health care provider how often you should have your eyes checked.  Personal lifestyle choices, including:  Daily care of your teeth and gums.  Regular physical activity.  Eating a healthy diet.  Avoiding tobacco and drug use.  Limiting alcohol use.  Practicing safe sex.  Taking low-dose aspirin every day.  Taking vitamin and  mineral supplements as recommended by your health care provider. What happens during an annual well check? The services and screenings done by your health care provider during your annual well check will depend on your age, overall health, lifestyle risk factors, and family history of disease. Counseling  Your health care provider may ask you questions about your:  Alcohol use.  Tobacco use.  Drug use.  Emotional well-being.  Home and relationship well-being.  Sexual activity.  Eating habits.  History of falls.  Memory and ability to understand (cognition).  Work and work Statistician.  Reproductive health. Screening  You may have the following tests or measurements:  Height, weight, and BMI.  Blood pressure.  Lipid and cholesterol levels. These may be checked every 5 years, or more frequently if you are over 81 years old.  Skin check.  Lung cancer screening. You may have this screening every year starting at age 69 if you have a 30-pack-year history of smoking and currently smoke or have quit within the past 15 years.  Fecal occult blood test (FOBT) of the stool. You may have this test every year starting at age 40.  Flexible sigmoidoscopy or colonoscopy. You may have a sigmoidoscopy every 5 years or a colonoscopy every 10 years starting at age 67.  Hepatitis C blood test.  Hepatitis B blood test.  Sexually transmitted disease (STD) testing.  Diabetes screening. This is done by checking your blood sugar (glucose) after you have not eaten for a while (fasting). You may have this  done every 1-3 years.  Bone density scan. This is done to screen for osteoporosis. You may have this done starting at age 68.  Mammogram. This may be done every 1-2 years. Talk to your health care provider about how often you should have regular mammograms. Talk with your health care provider about your test results, treatment options, and if necessary, the need for more tests. Vaccines   Your health care provider may recommend certain vaccines, such as:  Influenza vaccine. This is recommended every year.  Tetanus, diphtheria, and acellular pertussis (Tdap, Td) vaccine. You may need a Td booster every 10 years.  Zoster vaccine. You may need this after age 18.  Pneumococcal 13-valent conjugate (PCV13) vaccine. One dose is recommended after age 65.  Pneumococcal polysaccharide (PPSV23) vaccine. One dose is recommended after age 63. Talk to your health care provider about which screenings and vaccines you need and how often you need them. This information is not intended to replace advice given to you by your health care provider. Make sure you discuss any questions you have with your health care provider. Document Released: 09/10/2015 Document Revised: 05/03/2016 Document Reviewed: 06/15/2015 Elsevier Interactive Patient Education  2017 [place] Prevention in the Home Falls can cause injuries. They can happen to people of all ages. There are many things you can do to make your home safe and to help prevent falls. What can I do on the outside of my home?  Regularly fix the edges of walkways and driveways and fix any cracks.  Remove anything that might make you trip as you walk through a door, such as a raised step or threshold.  Trim any bushes or trees on the path to your home.  Use bright outdoor lighting.  Clear any walking paths of anything that might make someone trip, such as rocks or tools.  Regularly check to see if handrails are loose or broken. Make sure that both sides of any steps have handrails.  Any raised decks and porches should have guardrails on the edges.  Have any leaves, snow, or ice cleared regularly.  Use sand or salt on walking paths during winter.  Clean up any spills in your garage right away. This includes oil or grease spills. What can I do in the bathroom?  Use night lights.  Install grab bars by the toilet and in the  tub and shower. Do not use towel bars as grab bars.  Use non-skid mats or decals in the tub or shower.  If you need to sit down in the shower, use a plastic, non-slip stool.  Keep the floor dry. Clean up any water that spills on the floor as soon as it happens.  Remove soap buildup in the tub or shower regularly.  Attach bath mats securely with double-sided non-slip rug tape.  Do not have throw rugs and other things on the floor that can make you trip. What can I do in the bedroom?  Use night lights.  Make sure that you have a light by your bed that is easy to reach.  Do not use any sheets or blankets that are too big for your bed. They should not hang down onto the floor.  Have a firm chair that has side arms. You can use this for support while you get dressed.  Do not have throw rugs and other things on the floor that can make you trip. What can I do in the kitchen?  Clean up any spills  right away.  Avoid walking on wet floors.  Keep items that you use a lot in easy-to-reach places.  If you need to reach something above you, use a strong step stool that has a grab bar.  Keep electrical cords out of the way.  Do not use floor polish or wax that makes floors slippery. If you must use wax, use non-skid floor wax.  Do not have throw rugs and other things on the floor that can make you trip. What can I do with my stairs?  Do not leave any items on the stairs.  Make sure that there are handrails on both sides of the stairs and use them. Fix handrails that are broken or loose. Make sure that handrails are as long as the stairways.  Check any carpeting to make sure that it is firmly attached to the stairs. Fix any carpet that is loose or worn.  Avoid having throw rugs at the top or bottom of the stairs. If you do have throw rugs, attach them to the floor with carpet tape.  Make sure that you have a light switch at the top of the stairs and the bottom of the stairs. If you  do not have them, ask someone to add them for you. What else can I do to help prevent falls?  Wear shoes that:  Do not have high heels.  Have rubber bottoms.  Are comfortable and fit you well.  Are closed at the toe. Do not wear sandals.  If you use a stepladder:  Make sure that it is fully opened. Do not climb a closed stepladder.  Make sure that both sides of the stepladder are locked into place.  Ask someone to hold it for you, if possible.  Clearly mark and make sure that you can see:  Any grab bars or handrails.  First and last steps.  Where the edge of each step is.  Use tools that help you move around (mobility aids) if they are needed. These include:  Canes.  Walkers.  Scooters.  Crutches.  Turn on the lights when you go into a dark area. Replace any light bulbs as soon as they burn out.  Set up your furniture so you have a clear path. Avoid moving your furniture around.  If any of your floors are uneven, fix them.  If there are any pets around you, be aware of where they are.  Review your medicines with your doctor. Some medicines can make you feel dizzy. This can increase your chance of falling. Ask your doctor what other things that you can do to help prevent falls. This information is not intended to replace advice given to you by your health care provider. Make sure you discuss any questions you have with your health care provider. Document Released: 06/10/2009 Document Revised: 01/20/2016 Document Reviewed: 09/18/2014 Elsevier Interactive Patient Education  2017 [employer].

## 2020-10-20 NOTE — Telephone Encounter (Signed)
Called and left voicemail. Mammogram normal. Repeat in 1 year.

## 2020-10-20 NOTE — Progress Notes (Signed)
Subjective:   Melissa Anderson is a 80 y.o. female who presents for Medicare Annual (Subsequent) preventive examination.  Review of Systems    No ROS.  Medicare Wellness Virtual Visit.     Cardiac Risk Factors include: advanced age (>29men, >46 women)     Objective:    Today's Vitals   10/20/20 1109  Weight: 181 lb 9.6 oz (82.4 kg)  Height: 5' 5.75" (1.67 m)   Body mass index is 29.53 kg/m.  Advanced Directives 10/20/2020 06/02/2020 02/11/2020 10/09/2018 08/08/2018 05/31/2018 05/28/2018  Does Patient Have a Medical Advance Directive? Yes Yes Yes - Yes Yes No  Type of Advance Directive [place];Living will [place];Living will [place];Living will [place];Living will [place];Living will Living will;Healthcare Power of Attorney -  Does patient want to make changes to medical advance directive? No - Patient declined - - - No - Patient declined No - Patient declined No - Patient declined  Copy of [place] in Chart? Yes - validated most recent copy scanned in chart (See row information) - - No - copy requested Yes - validated most recent copy scanned in chart (See row information) No - copy requested No - copy requested  Would patient like information on creating a medical advance directive? - - - - No - Patient declined - No - Patient declined    Current Medications (verified) Outpatient Encounter Medications as of 10/20/2020  Medication Sig  . Cholecalciferol (VITAMIN D3 PO) Take 1,000 mg by mouth.  Marland Kitchen aspirin EC 81 MG tablet Take 81 mg by mouth daily.  . celecoxib (CELEBREX) 100 MG capsule Take by mouth.  . ciprofloxacin (CIPRO) 500 MG tablet Take 1 tablet (500 mg total) by mouth 2 (two) times daily. With food x 3-5 days  . oxybutynin (DITROPAN XL) 15 MG 24 hr tablet Take 15 mg by mouth daily at 12 noon.  Marland Kitchen SYNTHROID 175 MCG tablet Take 1 tablet (175 mcg total) by  mouth daily.   No facility-administered encounter medications on file as of 10/20/2020.    Allergies (verified) Crestor [rosuvastatin], Epinephrine, Influenza vaccines, and Lipitor [atorvastatin calcium]   History: Past Medical History:  Diagnosis Date  . Arthritis    right shoulder  . 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 Guillain Barr syndrome ([place])    2/2 flu shot 05/2018,  intubated x 8 days  . Hyperlipidemia   . Overactive bladder   . Thyroid disease   . UTI (urinary tract infection)   . Vitamin D deficiency    Past Surgical History:  Procedure Laterality Date  . ABDOMINAL HYSTERECTOMY     02/15/18   . CHOLECYSTECTOMY  2008  . LAPAROSCOPIC VAGINAL HYSTERECTOMY WITH SALPINGO OOPHORECTOMY  02/15/2018  . PORTACATH PLACEMENT Left 02/25/2018   Procedure: INSERTION PORT-A-CATH;  Surgeon: Robert Bellow, MD;  Location: [facility] ORS;  Service: General;  Laterality: Left;  . TOTAL ABDOMINAL HYSTERECTOMY W/ BILATERAL SALPINGOOPHORECTOMY  02/15/2018   lymph nodes removed   Family History  Problem Relation Age of Onset  . Sudden death Mother 65       drowning in hurricane Katrina  . Pancreatic cancer Father        deceased early 51s; smoker  . Alcohol abuse Father   . COPD Father   . Coronary artery disease Brother   . Melanoma Sister  deceased at 66  . COPD Sister   . Prostate cancer Maternal Uncle        deceased 39  . Breast cancer Paternal Aunt        unsure of this dx   Social History   Socioeconomic History  . Marital status: Widowed    Spouse name: Not on file  . Number of children: Not on file  . Years of education: Not on file  . Highest education level: Not on file  Occupational History  . Not on file  Tobacco Use  . Smoking status: Former Smoker    Packs/day: 1.00    Years: 25.00    Pack years: 25.00    Types: Cigarettes    Quit date: 02/26/1984    Years since quitting: 36.6  . Smokeless  tobacco: Never Used  . Tobacco comment: age 51 to 41 1 ppd   Vaping Use  . Vaping Use: Never used  Substance and Sexual Activity  . Alcohol use: Never  . Drug use: Never  . Sexual activity: Not Currently  Other Topics Concern  . Not on file  Social History Narrative   Engineer, maintenance (IT), bachelors degree    Lives in [place] here visiting daughter [name]    Former smoker quit age 20 y.o    Social Determinants of Health   Financial Resource Strain: Low Risk   . Difficulty of Paying Living Expenses: Not hard at all  Food Insecurity: No Food Insecurity  . Worried About Charity fundraiser in the Last Year: Never true  . Ran Out of Food in the Last Year: Never true  Transportation Needs: No Transportation Needs  . Lack of Transportation (Medical): No  . Lack of Transportation (Non-Medical): No  Physical Activity: Not on file  Stress: No Stress Concern Present  . Feeling of Stress : Not at all  Social Connections: Not on file    Tobacco Counseling Counseling given: Not Answered Comment: age 45 to 77 1 ppd    Clinical Intake:  Pre-visit preparation completed: Yes        Diabetes: No  How often do you need to have someone help you when you read instructions, pamphlets, or other written materials from your doctor or pharmacy?: 1 - Never   Interpreter Needed?: No      Activities of Daily Living In your present state of health, do you have any difficulty performing the following activities: 10/20/2020  Hearing? N  Vision? N  Difficulty concentrating or making decisions? N  Walking or climbing stairs? Y  Comment Cane for more stability as needed. Paces self.  Dressing or bathing? N  Doing errands, shopping? N  Preparing Food and eating ? N  Using the Toilet? N  In the past six months, have you accidently leaked urine? Y  Comment Managed with poise pad daily  Do you have problems with loss of bowel control? N  Managing your Medications? N  Managing your Finances? N   Housekeeping or managing your Housekeeping? N  Some recent data might be hidden    Patient Care Team: McLean-Scocuzza, Nino Glow, MD as PCP - General (Internal Medicine) Gillis Ends, MD as Referring Physician (Obstetrics and Gynecology) Bary Castilla Forest Gleason, MD as Surgeon (General Surgery) Sindy Guadeloupe, MD as Consulting Physician (Oncology) Clent Jacks, RN as Oncology Nurse Navigator  Indicate any recent Medical Services you may have received from other than [facility] providers in the past year (date may be approximate).  Assessment:   This is a routine wellness examination for [place].  I connected with Judy today by telephone and verified that I am speaking with the correct person using two identifiers. Location patient: home Location provider: work Persons participating in the virtual visit: patient, Marine scientist.    I discussed the limitations, risks, security and privacy concerns of performing an evaluation and management service by telephone and the availability of in person appointments. The patient expressed understanding and verbally consented to this telephonic visit.    Interactive audio and video telecommunications were attempted between this provider and patient, however failed, due to patient having technical difficulties OR patient did not have access to video capability.  We continued and completed visit with audio only.  Some vital signs may be absent or patient reported.   Hearing/Vision screen  Hearing Screening   125Hz  250Hz  500Hz  1000Hz  2000Hz  3000Hz  4000Hz  6000Hz  8000Hz   Right ear:           Left ear:           Comments: Patient is able to hear conversational tones without difficulty.  No issues reported.  Vision Screening Comments: Wears corrective lenses  Visual acuity not assessed per patient preference since they have regular follow up with the ophthalmologist  Dietary issues and exercise activities discussed: Current Exercise Habits: The  patient does not participate in regular exercise at present  Healthy diet Good water intake  Goals      Patient Stated   .  I would like to increase water intake (pt-stated)      Depression Screen PHQ 2/9 Scores 10/20/2020 03/09/2020 05/31/2018  PHQ - 2 Score 0 0 0  PHQ- 9 Score - 0 -    Fall Risk Fall Risk  10/20/2020 09/28/2020 03/09/2020 10/10/2018 05/31/2018  Falls in the past year? 0 0 0 0 No  Number falls in past yr: 0 0 0 0 -  Injury with Fall? 0 0 0 - -  Risk for fall due to : Impaired balance/gait Impaired balance/gait - - -  Follow up Falls evaluation completed Falls evaluation completed Falls evaluation completed Falls evaluation completed -    FALL RISK PREVENTION PERTAINING TO THE HOME: Handrails in use when climbing stairs? Yes Home free of loose throw rugs in walkways, pet beds, electrical cords, etc? Yes  Adequate lighting in your home to reduce risk of falls? Yes   ASSISTIVE DEVICES UTILIZED TO PREVENT FALLS: Life alert? No  Use of a cane, walker or w/c? Yes , as needed  TIMED UP AND GO: Was the test performed? No . Virtual visit.   Cognitive Function:  Patient is alert and oriented x3.  Denies difficulty focusing, making decisions, memory loss.  Enjoys working in Research officer, political party taxes.  MMSE/6CIT deferred. Normal by direct communication/observation.     6CIT Screen 05/31/2018  What Year? 0 points  What month? 0 points  What time? 0 points  Count back from 20 0 points  Months in reverse 0 points  Repeat phrase 0 points  Total Score 0    Immunizations Immunization History  Administered Date(s) Administered  . Influenza, High Dose Seasonal PF 05/31/2018  . Influenza-Unspecified 05/13/2018  . PFIZER(Purple Top)SARS-COV-2 Vaccination 02/09/2020, 03/02/2020   Health Maintenance Health Maintenance  Topic Date Due  . COVID-19 Vaccine (3 - Pfizer risk 4-dose series) 10/26/2020 (Originally 03/30/2020)  . INFLUENZA VACCINE  11/25/2020 (Originally  03/28/2020)  . DEXA SCAN  Completed  . TETANUS/TDAP  Discontinued  . PNA vac  Low Risk Adult  Discontinued   Colorectal cancer screening: Type of screening: Cologuard. Completed 02/06/19. Repeat every 3 years  Mammogram status: Completed 10/19/20. Repeat every year  Lung Cancer Screening: (Low Dose CT Chest recommended if Age 32-80 years, 30 pack-year currently smoking OR have quit w/in 15years.) does not qualify.   Hepatitis C Screening: does not qualify.  Vision Screening: Recommended annual ophthalmology exams for early detection of glaucoma and other disorders of the eye. Is the patient up to date with their annual eye exam?  Yes  Who is the provider or what is the name of the office in which the patient attends annual eye exams? Last visit one year ago in [place]. Plans to schedule with local ophthalmologist.   Dental Screening: Recommended annual dental exams for proper oral hygiene. Plans to schedule with local dentist.   Community Resource Referral / Chronic Care Management: CRR required this visit?  No   CCM required this visit?  No      Plan:   Keep all routine maintenance appointments.   Follow up 12/28/20 @ 8:00  Awaiting physical therapy feedback with [facility].  Patient is considering joining the gym x3 months for body strengthening exercises. Patient wants to defer to pcp for feedback if not okay.  Deferred to pcp for feedback if not okay.   I have personally reviewed and noted the following in the patient's chart:   . Medical and social history . Use of alcohol, tobacco or illicit drugs  . Current medications and supplements . Functional ability and status . Nutritional status . Physical activity . Advanced directives . List of other physicians . Hospitalizations, surgeries, and ER visits in previous 12 months . Vitals . Screenings to include cognitive, depression, and falls . Referrals and appointments  In addition, I have reviewed and discussed  with patient certain preventive protocols, quality metrics, and best practice recommendations. A written personalized care plan for preventive services as well as general preventive health recommendations were provided to patient via mychart.     Varney Biles, LPN   5/52/0802

## 2020-10-22 LAB — URINE CULTURE

## 2020-10-22 LAB — SPECIMEN STATUS REPORT

## 2020-10-25 ENCOUNTER — Other Ambulatory Visit: Payer: Self-pay

## 2020-10-25 ENCOUNTER — Other Ambulatory Visit: Payer: Medicare Other

## 2020-10-25 DIAGNOSIS — N3000 Acute cystitis without hematuria: Secondary | ICD-10-CM | POA: Diagnosis not present

## 2020-10-25 DIAGNOSIS — N3281 Overactive bladder: Secondary | ICD-10-CM

## 2020-10-27 ENCOUNTER — Telehealth: Payer: Self-pay | Admitting: Internal Medicine

## 2020-10-27 LAB — URINALYSIS, ROUTINE W REFLEX MICROSCOPIC
Bilirubin, UA: NEGATIVE
Glucose, UA: NEGATIVE
Ketones, UA: NEGATIVE
Nitrite, UA: NEGATIVE
Protein,UA: NEGATIVE
RBC, UA: NEGATIVE
Specific Gravity, UA: 1.011 (ref 1.005–1.030)
Urobilinogen, Ur: 0.2 mg/dL (ref 0.2–1.0)
pH, UA: 6.5 (ref 5.0–7.5)

## 2020-10-27 LAB — MICROSCOPIC EXAMINATION
Bacteria, UA: NONE SEEN
Casts: NONE SEEN /lpf
RBC, Urine: NONE SEEN /hpf (ref 0–2)
WBC, UA: NONE SEEN /hpf (ref 0–5)

## 2020-10-27 LAB — URINE CULTURE

## 2020-10-27 NOTE — Telephone Encounter (Signed)
[facility] test sensitive and urine cult

## 2020-10-29 NOTE — Telephone Encounter (Signed)
Thressa Sheller, CMA  10/29/2020  1:21 PM EST Back to Top     Left detailed message, mychart message sent    Delorise Jackson, MD  10/27/2020  9:36 AM EST      Uti resolved

## 2020-10-29 NOTE — Telephone Encounter (Signed)
Patient informed and verbalized understanding

## 2020-10-29 NOTE — Telephone Encounter (Signed)
Patient returned office phone call. 

## 2020-11-15 ENCOUNTER — Telehealth: Payer: Self-pay | Admitting: Internal Medicine

## 2020-11-15 ENCOUNTER — Ambulatory Visit: Payer: Medicare Other | Attending: Internal Medicine

## 2020-11-15 ENCOUNTER — Other Ambulatory Visit (HOSPITAL_COMMUNITY): Payer: Self-pay | Admitting: Internal Medicine

## 2020-11-15 DIAGNOSIS — Z23 Encounter for immunization: Secondary | ICD-10-CM

## 2020-11-15 DIAGNOSIS — E039 Hypothyroidism, unspecified: Secondary | ICD-10-CM

## 2020-11-15 MED ORDER — SYNTHROID 175 MCG PO TABS: 175.0000 ug | ORAL_TABLET | Freq: Every day | ORAL | 3 refills | Status: DC

## 2020-11-15 NOTE — Telephone Encounter (Signed)
Patient called in for refill on  SYNTHROID 175 MCG tablet would like it sent to [facility]

## 2020-11-15 NOTE — Progress Notes (Signed)
   Covid-19 Vaccination Clinic  Name:  RESHMA HOEY    MRN: [id number] DOB: 12-12-1940  11/15/2020  Ms. Arenz was observed post Covid-19 immunization for 15 minutes without incident. She was provided with Vaccine Information Sheet and instruction to access the V-Safe system.   Ms. Valdes was instructed to call 911 with any severe reactions post vaccine: Marland Kitchen Difficulty breathing  . Swelling of face and throat  . A fast heartbeat  . A bad rash all over body  . Dizziness and weakness   Immunizations Administered    Name Date Dose VIS Date Route   PFIZER Comrnaty(Gray TOP) Covid-19 Vaccine 11/15/2020 12:10 PM 0.3 mL 08/05/2020 Intramuscular   Manufacturer: [place]   Lot: [id number]   NDC: [phone]

## 2020-12-22 ENCOUNTER — Telehealth (INDEPENDENT_AMBULATORY_CARE_PROVIDER_SITE_OTHER): Payer: Self-pay

## 2020-12-22 DIAGNOSIS — G620 Drug-induced polyneuropathy: Secondary | ICD-10-CM | POA: Diagnosis not present

## 2020-12-22 DIAGNOSIS — M160 Bilateral primary osteoarthritis of hip: Secondary | ICD-10-CM | POA: Diagnosis not present

## 2020-12-22 DIAGNOSIS — T451X5A Adverse effect of antineoplastic and immunosuppressive drugs, initial encounter: Secondary | ICD-10-CM | POA: Diagnosis not present

## 2020-12-22 DIAGNOSIS — I700 Atherosclerosis of aorta: Secondary | ICD-10-CM | POA: Diagnosis not present

## 2020-12-22 NOTE — Telephone Encounter (Signed)
This was received from Kern [place] at [facility]:  Melissa Anderson, Happy Friday! I wanted to give you an update on patient MRN [id number]. We had an appointment with the patient this morning. When we showed up, she said she thought it was Wednesday and we had forgotten. Let her know we had scheduled for today and asked if she had time since we were there. She said "It'll have to be another day" and shut the door.  Will try to reschedule with her. Just wanted to make you aware.   [place]

## 2020-12-28 ENCOUNTER — Ambulatory Visit: Payer: Medicare Other | Admitting: Internal Medicine

## 2021-01-05 ENCOUNTER — Inpatient Hospital Stay: Payer: Medicare Other | Attending: Obstetrics and Gynecology

## 2021-01-05 ENCOUNTER — Encounter: Payer: Self-pay | Admitting: Internal Medicine

## 2021-01-05 ENCOUNTER — Ambulatory Visit (INDEPENDENT_AMBULATORY_CARE_PROVIDER_SITE_OTHER): Payer: Medicare Other | Admitting: Internal Medicine

## 2021-01-05 ENCOUNTER — Other Ambulatory Visit: Payer: Self-pay

## 2021-01-05 ENCOUNTER — Inpatient Hospital Stay (HOSPITAL_BASED_OUTPATIENT_CLINIC_OR_DEPARTMENT_OTHER): Payer: Medicare Other | Admitting: Obstetrics and Gynecology

## 2021-01-05 VITALS — BP 137/64 | HR 81 | Temp 97.8°F | Resp 20 | Wt 189.6 lb

## 2021-01-05 VITALS — BP 122/80 | HR 64 | Temp 97.5°F | Ht 65.75 in | Wt 189.4 lb

## 2021-01-05 DIAGNOSIS — Z87891 Personal history of nicotine dependence: Secondary | ICD-10-CM | POA: Insufficient documentation

## 2021-01-05 DIAGNOSIS — I700 Atherosclerosis of aorta: Secondary | ICD-10-CM | POA: Insufficient documentation

## 2021-01-05 DIAGNOSIS — Z9221 Personal history of antineoplastic chemotherapy: Secondary | ICD-10-CM | POA: Diagnosis not present

## 2021-01-05 DIAGNOSIS — R0602 Shortness of breath: Secondary | ICD-10-CM

## 2021-01-05 DIAGNOSIS — Z90710 Acquired absence of both cervix and uterus: Secondary | ICD-10-CM | POA: Insufficient documentation

## 2021-01-05 DIAGNOSIS — G620 Drug-induced polyneuropathy: Secondary | ICD-10-CM | POA: Insufficient documentation

## 2021-01-05 DIAGNOSIS — Z8543 Personal history of malignant neoplasm of ovary: Secondary | ICD-10-CM | POA: Insufficient documentation

## 2021-01-05 DIAGNOSIS — I890 Lymphedema, not elsewhere classified: Secondary | ICD-10-CM

## 2021-01-05 DIAGNOSIS — N3000 Acute cystitis without hematuria: Secondary | ICD-10-CM

## 2021-01-05 DIAGNOSIS — E559 Vitamin D deficiency, unspecified: Secondary | ICD-10-CM | POA: Insufficient documentation

## 2021-01-05 DIAGNOSIS — E039 Hypothyroidism, unspecified: Secondary | ICD-10-CM | POA: Diagnosis not present

## 2021-01-05 DIAGNOSIS — E785 Hyperlipidemia, unspecified: Secondary | ICD-10-CM | POA: Insufficient documentation

## 2021-01-05 DIAGNOSIS — Z1231 Encounter for screening mammogram for malignant neoplasm of breast: Secondary | ICD-10-CM | POA: Diagnosis not present

## 2021-01-05 DIAGNOSIS — Z90722 Acquired absence of ovaries, bilateral: Secondary | ICD-10-CM | POA: Diagnosis not present

## 2021-01-05 DIAGNOSIS — N3281 Overactive bladder: Secondary | ICD-10-CM | POA: Diagnosis not present

## 2021-01-05 DIAGNOSIS — C561 Malignant neoplasm of right ovary: Secondary | ICD-10-CM

## 2021-01-05 MED ORDER — OXYBUTYNIN CHLORIDE ER 15 MG PO TB24: 15.0000 mg | ORAL_TABLET | Freq: Every day | ORAL | 3 refills | Status: DC

## 2021-01-05 NOTE — Progress Notes (Signed)
[facility]  Telephone:([phone] [phone]  Patient Care Team: McLean-Scocuzza, Nino Glow, MD as PCP - General (Internal Medicine) Gillis Ends, MD as Referring Physician (Obstetrics and Gynecology) Bary Castilla Forest Gleason, MD as Surgeon (General Surgery) Sindy Guadeloupe, MD as Consulting Physician (Oncology) Clent Jacks, RN as Oncology Nurse Navigator   Name of the patient: Melissa Anderson  [id number]  April 23, 1941   Date of visit: 01/05/2021  Gynecologic Oncology Interval Visit   Referring Provider: Dr Mike Gip  Chief Complaint: stage II ovarian cancer  Subjective:  Melissa Anderson is a 80 y.o. 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   CA 125 (not elevated at time of diagnosis) 09/08/2020 16.8 06/02/20 18.6 02/11/20 16 10/09/2018 16.9 02/05/2018 18 (diagnosis)   Last imaging was 06/01/20 which was negative for metastatic disease.   She has a h/o prior breast mass and she requested we order her mammogram done on 10/19/2020  IMPRESSION: No evidence of malignancy in the bilateral breasts. RECOMMENDATION: Screening mammogram in one year.(Code:SM-B-01Y) BI-RADS CATEGORY  1: Negative.  She is due for her CT scan today.   Gynecologic Oncology History:  She was at her PCP office in March and was complaining of intermittent weakness and pain shooting down her right leg. Her PCP ordered an MRI of her lumbosacral spine which demonstrated this right adnexal mass as an incidental finding.   Radiologic Imaging: 11/12/2017 Lumbosacral spine MRI scan noted a multicystic 4 x 5.5 cm right adnexal mass with fluid blood levels. Prominent uterine  endometrial stripe.   12/20/2017 Pelvic Ultrasound: Uterus measures 9.7 x 4.1 x 5.9cm and is retroverted. Endometrial stripe measures 9 mm. Within it, as outlines by a small amount of fluid, there is a 1.4 x 0.7 x 1.4 cm polypoid echogenic structure with internal blood flow and small cystic components. A similar appearing but smaller 5 x 4 x 3 mm echogenic nodular density is noted within the endometrial cavity as well. The uterus is otherwise normal in appearance. There is a multiloculated complex cystic structure in the right adnexa measuring 6.3 x 6.1 x 4.7 cm. This has solid appearing components as well as anechoic portions, separated by thick septations. On MRI, many of these loculations demonstrate fluid-filled levels. No normal ovarian parenchyma is identified. The left ovary was not seen. There is no pelvic free fluid.  She subsequently underwent an EMB with her gynecologist and this was negative.   She apparently was not aware that she had a cyst this size until she was seen by her provider who recommended Gyn Oncology consultation for further evaluation. She was seen by Dr. Carlton Adam who recommended surgery and further imaging and tumor markers.   02/05/2018: CA 125 = 18, CA 19-9 = 8, CEA <0.5, OVA1 = 4 (low risk)  02/09/2018 CT Scan A/P: multi-septated complex cystic lesion noted in the right pelvis.adnexa immediately superior to the uterus which measures approximately 6.9 x 5.3 am in greatest axial dimensions and approximately 5.4 cm in greatest cranial caudal dimensions.  Abnormal thickening of the endometrial cavity which measures 1.3 cm in greatest width.  Multiple peripelvic cysts are identified in bilateral renal hilar regions measuring up to 3.6 cm on the right and 2.9 cm on the  left.  On 02/15/18, she underwent TLH, BSO, with bilateral pelvic LN dissection, peritoneal biopsies, and omental biopsy with Dr. Unknown Foley at [facility]. No spread beyond tube and ovaries.  She was discharged on POD1 w/o  complication.    Pathology A. 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  See the synoptic report. Note: The tumor is positive for PAX8, WT1, and p53 (strong, uniform). Based on the presence of tubal intraepithelial carcinoma, this is considered a tubal primary.  B.  Endometrium, curettage: Fragments of endometrial polyp.  C. Uterus, total hysterectomy:   Uterus:    Endometrium: Endometrial polyp with focal glandular crowding, see note.    Myometrium: Adenomyosis, leiomyomata (up to 1 cm).    Cervix: No pathologic diagnosis.     Serosa: No pathologic diagnosis.  Note: The areas of endometrial glandular crowding are not cytologically distinct from the background and therefore do not meet the criteria for endometrial intraepithelial neoplasia (EIN).  D. Right pelvic lymph nodes, lymphadenectomy: Three lymph nodes, negative for malignancy (0/3).   E. Left pelvic lymph nodes, lymphadenectomy: Three lymph nodes, negative for malignancy (0/3).   F. Omentum, omentectomy: Benign omentum.  G. Peritoneal biopsy #1: Benign fibromuscular tissue.  H. Peritoneal biopsy #2: Benign fibrous tissue.  I. Peritoneal biopsy #3: Benign fibroadipose tissue.  J. Peritoneal biopsy #4: Benign fibroadipose tissue.  A. Pelvic Washings: - Negative. No Evidence of Malignancy  03/21/18. She was referred to Dr. Janese Banks, medical-oncology at [facility] and initiated cycle 1 of adjuvant carbo-Taxol on 03/21/18.   05/28/2018 Patient received cycle 4 of chemotherapy on . After 4 cycles of chemotherapy patient developed progressive weakness in her bilateral lower extremities and was admitted to the hospital in [place] where she developed worsening respiratory failure and had to be intubated was airlifted to [place].  Patient was  eventually extubated.  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  She was given IVIG as well.  Of note she has received flu shot consistently in the past and has never had these complications ever. Patient was then sent to a neuro rehab unit and was eventually discharged home.   08/16/2018 - CT C/A/P IMPRESSION: 1. Status post hysterectomy and bilateral salpingo oophorectomy. No findings for metastatic ovarian cancer. No peritoneal surface or omental disease or abdominal/pelvic lymphadenopathy. 2. Mild chronic scarring changes in the lungs but no findings worrisome for pulmonary metastatic disease.  She has had her covid vaccines without complication.     Genetic Testing:  - Invitae 83 gene Multi-Cancer panel was negative for pathogenic mutation in any genes. No VUS detected.   Problem List: Patient Active Problem List   Diagnosis Date Noted  . 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  . Lymphedema 09/28/2020  . Abnormal gait 09/28/2020  . 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 Aortic atherosclerosis ([place]) 07/14/2020  . 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  . Chemotherapy-induced peripheral neuropathy ([place]) 02/11/2020  . History of ovarian cancer 02/11/2020  . Acute pain of right shoulder 08/15/2018  . Guillain-Barre syndrome ([place]) 06/17/2018  . Chemotherapy-induced neuropathy ([place]) 05/14/2018  . Vitamin D deficiency 05/08/2018  . Neuropathy 05/08/2018  . Overactive bladder 05/08/2018  . Bilateral leg edema 05/08/2018  . HLD (hyperlipidemia) 05/08/2018  . Goals of care, counseling/discussion  02/25/2018  . Ovarian cancer ([place]) 02/22/2018  . Graves' disease 02/13/2018    Past Medical History: Past Medical History:  Diagnosis Date  . Arthritis    right shoulder  . Cancer ([place]) 2019   b/l ovaries and left fallopian tube pelvic lymph nodes  negative   . Guillain Barr syndrome ([place])    2/2 flu shot 05/2018,  intubated x 8 days  . Hyperlipidemia   . Overactive bladder   . Thyroid disease   . UTI (urinary tract infection)   . Vitamin D deficiency     Past Surgical History: Past Surgical History:  Procedure Laterality Date  . ABDOMINAL HYSTERECTOMY     02/15/18   . CHOLECYSTECTOMY  2008  . LAPAROSCOPIC VAGINAL HYSTERECTOMY WITH SALPINGO OOPHORECTOMY  02/15/2018  . PORTACATH PLACEMENT Left 02/25/2018   Procedure: INSERTION PORT-A-CATH;  Surgeon: Robert Bellow, MD;  Location: [facility] ORS;  Service: General;  Laterality: Left;  . TOTAL ABDOMINAL HYSTERECTOMY W/ BILATERAL SALPINGOOPHORECTOMY  02/15/2018   lymph nodes removed    OB History:  OB History  Gravida Para Term Preterm AB Living  2         2  SAB IAB Ectopic Multiple Live Births               # Outcome Date GA Lbr Len/2nd Weight Sex Delivery Anes PTL Lv  2 Gravida           1 Gravida             Obstetric Comments  Menstrual age: 89    Age 1st Pregnancy: 30    Family History: Family History  Problem Relation Age of Onset  . Sudden death Mother 9       drowning in hurricane Katrina  . Pancreatic cancer Father        deceased early 35s; smoker  . Alcohol abuse Father   . COPD Father   . Coronary artery disease Brother   . Melanoma Sister        deceased at 21  . COPD Sister   . Prostate cancer Maternal Uncle        deceased 58  . Breast cancer Paternal Aunt        unsure of this dx    Social History: Social History   Socioeconomic History  . Marital status: Widowed    Spouse name: Not on file  . Number of children: Not on file  . Years of education: Not on file  . Highest education level: Not on file  Occupational History  . Not on file  Tobacco Use  . Smoking status: Former Smoker    Packs/day: 1.00    Years: 25.00    Pack years: 25.00    Types: Cigarettes    Quit date: 02/26/1984    Years since quitting: 36.8  . Smokeless  tobacco: Never Used  . Tobacco comment: age 67 to 65 1 ppd   Vaping Use  . Vaping Use: Never used  Substance and Sexual Activity  . Alcohol use: Never  . Drug use: Never  . Sexual activity: Not Currently  Other Topics Concern  . Not on file  Social History Narrative   Engineer, maintenance (IT), bachelors degree    Lives in [place] here visiting daughter [name]    Former smoker quit age 88 y.o    Social Determinants of Health   Financial Resource Strain: Low Risk   . Difficulty of Paying Living Expenses: Not hard at all  Food Insecurity: No Food Insecurity  . Worried About Charity fundraiser in the Last Year: Never true  . Ran Out of Food in the Last Year: Never true  Transportation Needs: No Transportation Needs  . Lack of Transportation (Medical): No  . Lack of Transportation (Non-Medical): No  Physical Activity: Not on file  Stress: No Stress Concern Present  . Feeling of Stress : Not at all  Social Connections: Not on file  Intimate Partner Violence: Not At Risk  . Fear of Current or Ex-Partner: No  . Emotionally Abused: No  . Physically Abused: No  . Sexually Abused: No   Immunization History  Administered Date(s) Administered  . Influenza, High Dose Seasonal PF 05/31/2018  . Influenza-Unspecified 05/13/2018  . PFIZER Comirnaty(Gray Top)Covid-19 Tri-Sucrose Vaccine 11/15/2020  . PFIZER(Purple Top)SARS-COV-2 Vaccination 02/09/2020, 03/02/2020   Allergies: Allergies  Allergen Reactions  . Crestor [Rosuvastatin]     Myopathy    . Epinephrine Other (See Comments)    Funny feeling, heart racing   . Influenza Vaccines     ?GBS 05/2018   . Lipitor [Atorvastatin Calcium]     Myalgias      Review of Systems General: no complaints  HEENT: no complaints  Lungs: no complaints  Cardiac: no complaints  GI: no complaints  GU: no complaints  Musculoskeletal: no complaints  Extremities: no complaints  Skin: no complaints  Neuro: no complaints  Endocrine: no complaints   Psych: no complaints        Objective:  Physical Examination: There were no vitals taken for this visit.   ECOG Performance Status: 1 - Symptomatic but completely ambulatory  GENERAL: Patient is a well appearing female in no acute distress HEENT:  PERRL, neck supple with midline trachea. Thyroid without masses.  NODES:  No cervical, supraclavicular, axillary, or inguinal lymphadenopathy palpated.  LUNGS:  Clear to auscultation bilaterally.  No wheezes or rhonchi. HEART:  Regular rate and rhythm. No murmur appreciated. ABDOMEN:  Soft, nontender, nondistended. No ascites or hernias.   EXTREMITIES:  2+ bilateral and symmetrical peripheral edema.   SKIN:  Clear with no obvious rashes or skin changes. No nail dyscrasia. NEURO:  Nonfocal. Well oriented.  Appropriate affect.  Pelvic: EGBUS: no lesions Cervix: no lesions, nontender, mobile Vagina: no lesions, no discharge or bleeding Uterus: normal size, nontender, mobile Adnexa: no palpable masses Rectovaginal: confirmatory        Assessment:  Melissa Anderson is a 80 y.o. 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 Unknown Foley. She has initiated chemotherapy 03/21/2018 with carbo-Taxol and is treated by Dr. Janese Banks at [facility]. Chemotherapy discontinued after 05/28/2018 due to GBS from flu shot. Clinically NED. Radiographically NED. CA 125 pending.   Peripheral neuropathy.   Symptomatic bilateral lymphedema, possibly secondary to surgery. Lymphedema clinic evaluated and recommended lymphedema therapy. She did not purchase the equipment for treatment  She has completed germline genetic testing which was negative for mutation.   Medical co-morbidities complicating care: BMI of 29.41 Plan:   1. Malignant neoplasm of right ovary (HCC)  CA125 pending today. CT scan ordered and plan for repeat in ~6-7  months.  Continue to plan for physical exam including pelvic exam every 4 months for next year then 6 months for until five years, then annually thereafter.   Somatic testing was not recommended at this time because she does not have a BRCA  mutation and is not a candidate for maintenance PARP inhibitor after completing primary chemotherapy.  Somatic testing for mutation and HRD can be considered should she have recurrence.   She requested re-referral to PT for lymphedema management and we will try to arrange.   I personally had a face to face interaction and evaluated the patient jointly with the NP, Ms. Beckey Rutter.  I have reviewed her history and available records and have performed the key portions of the physical exam including HEENT, extremity, abdominal exam, pelvic exam with my findings confirming those documented above by the APP.  I have discussed the case with the APP and the patient.  I agree with the above documentation, assessment and plan which was fully formulated by me.  Counseling was completed by me.   I personally saw the patient and performed a substantive portion of this encounter in conjunction with the listed APP as documented above.  Cartina Brousseau Gaetana Michaelis, MD

## 2021-01-05 NOTE — Progress Notes (Signed)
Chief Complaint  Patient presents with  . Follow-up   F/u  1. uti recurrent w/o sx's today will check urine for resolution she has chronic urinary incontinence and wears liners daily  2. H/o ovarian cancer CT chest/ab/pelvis neg 05/2020 appt today with Dr. Doralee Albino gyn/onc will continue to f/u    Review of Systems  Constitutional: Negative for weight loss.  HENT: Negative for hearing loss.   Eyes: Negative for blurred vision.  Respiratory: Negative for shortness of breath.   Cardiovascular: Negative for chest pain.  Gastrointestinal: Negative for abdominal pain.  Genitourinary:       +iincontinence  Musculoskeletal: Positive for joint pain.  Skin: Negative for rash.  Neurological: Negative for headaches.  Psychiatric/Behavioral: Negative for depression.   Past Medical History:  Diagnosis Date  . Arthritis    right shoulder  . Cancer ([place]) 2019   b/l ovaries and left fallopian tube pelvic lymph nodes negative   . Guillain Barr syndrome ([place])    2/2 flu shot 05/2018,  intubated x 8 days  . Hyperlipidemia   . Overactive bladder   . Thyroid disease   . UTI (urinary tract infection)   . Vitamin D deficiency    Past Surgical History:  Procedure Laterality Date  . ABDOMINAL HYSTERECTOMY     02/15/18   . CHOLECYSTECTOMY  2008  . LAPAROSCOPIC VAGINAL HYSTERECTOMY WITH SALPINGO OOPHORECTOMY  02/15/2018  . PORTACATH PLACEMENT Left 02/25/2018   Procedure: INSERTION PORT-A-CATH;  Surgeon: Robert Bellow, MD;  Location: [facility] ORS;  Service: General;  Laterality: Left;  . TOTAL ABDOMINAL HYSTERECTOMY W/ BILATERAL SALPINGOOPHORECTOMY  02/15/2018   lymph nodes removed   Family History  Problem Relation Age of Onset  . Sudden death Mother 72       drowning in hurricane Katrina  . Pancreatic cancer Father        deceased early 74s; smoker  . Alcohol abuse Father   . COPD Father   . Coronary artery disease Brother   . Melanoma Sister        deceased at 50  . COPD Sister   .  Prostate cancer Maternal Uncle        deceased 70  . Breast cancer Paternal Aunt        unsure of this dx   Social History   Socioeconomic History  . Marital status: Widowed    Spouse name: Not on file  . Number of children: Not on file  . Years of education: Not on file  . Highest education level: Not on file  Occupational History  . Not on file  Tobacco Use  . Smoking status: Former Smoker    Packs/day: 1.00    Years: 25.00    Pack years: 25.00    Types: Cigarettes    Quit date: 02/26/1984    Years since quitting: 36.8  . Smokeless tobacco: Never Used  . Tobacco comment: age 60 to 45 1 ppd   Vaping Use  . Vaping Use: Never used  Substance and Sexual Activity  . Alcohol use: Never  . Drug use: Never  . Sexual activity: Not Currently  Other Topics Concern  . Not on file  Social History Narrative   Engineer, maintenance (IT), bachelors degree    Lives in [place] here visiting daughter [name]    Former smoker quit age 63 y.o    Social Determinants of Health   Financial Resource Strain: Low Risk   . Difficulty of Paying Living Expenses: Not hard at  all  Food Insecurity: No Food Insecurity  . Worried About Charity fundraiser in the Last Year: Never true  . Ran Out of Food in the Last Year: Never true  Transportation Needs: No Transportation Needs  . Lack of Transportation (Medical): No  . Lack of Transportation (Non-Medical): No  Physical Activity: Not on file  Stress: No Stress Concern Present  . Feeling of Stress : Not at all  Social Connections: Not on file  Intimate Partner Violence: Not At Risk  . Fear of Current or Ex-Partner: No  . Emotionally Abused: No  . Physically Abused: No  . Sexually Abused: No   Current Meds  Medication Sig  . amoxicillin (AMOXIL) 500 MG capsule Take 500 mg by mouth 3 (three) times daily.  Marland Kitchen aspirin EC 81 MG tablet Take 81 mg by mouth daily.  . diclofenac (VOLTAREN) 75 MG EC tablet Take 75 mg by mouth 2 (two) times daily.  Marland Kitchen SYNTHROID 175 MCG  tablet Take 1 tablet (175 mcg total) by mouth daily.  . [DISCONTINUED] oxybutynin (DITROPAN XL) 15 MG 24 hr tablet Take 15 mg by mouth daily at 12 noon.   Allergies  Allergen Reactions  . Crestor [Rosuvastatin]     Myopathy    . Epinephrine Other (See Comments)    Funny feeling, heart racing   . Influenza Vaccines     ?GBS 05/2018   . Lipitor [Atorvastatin Calcium]     Myalgias     Recent Results (from the past 2160 hour(s))  Urinalysis, Routine w reflex microscopic     Status: Abnormal   Collection Time: 10/25/20 10:45 AM  Result Value Ref Range   Specific Gravity, UA 1.011 1.005 - 1.030   pH, UA 6.5 5.0 - 7.5   Color, UA Yellow Yellow   Appearance Ur Clear Clear   Leukocytes,UA Trace (A) Negative   Protein,UA Negative Negative/Trace   Glucose, UA Negative Negative   Ketones, UA Negative Negative   RBC, UA Negative Negative   Bilirubin, UA Negative Negative   Urobilinogen, Ur 0.2 0.2 - 1.0 mg/dL   Nitrite, UA Negative Negative   Microscopic Examination See below:     Comment: Microscopic was indicated and was performed.  Microscopic Examination     Status: None   Collection Time: 10/25/20 10:45 AM  Result Value Ref Range   WBC, UA None seen 0 - 5 /hpf   RBC None seen 0 - 2 /hpf   Epithelial Cells (non renal) 0-10 0 - 10 /hpf   Casts None seen None seen /lpf   Bacteria, UA None seen None seen/Few  Urine Culture     Status: None   Collection Time: 10/25/20 10:45 AM   Urine  Result Value Ref Range   Urine Culture, Routine Final report    Organism ID, Bacteria Comment     Comment: Mixed urogenital flora 25,000-50,000 colony forming units per mL    Objective  Body mass index is 30.8 kg/m. Wt Readings from Last 3 Encounters:  01/05/21 189 lb 6.4 oz (85.9 kg)  10/20/20 181 lb 9.6 oz (82.4 kg)  10/18/20 187 lb (84.8 kg)   Temp Readings from Last 3 Encounters:  01/05/21 (!) 97.5 F (36.4 C) (Oral)  09/28/20 98 F (36.7 C) (Oral)  03/09/20 98.4 F (36.9 C)  (Oral)   BP Readings from Last 3 Encounters:  01/05/21 122/80  10/18/20 138/76  09/28/20 130/80   Pulse Readings from Last 3 Encounters:  01/05/21 64  10/18/20 70  09/28/20 (!) 101    Physical Exam Vitals and nursing note reviewed.  Constitutional:      Appearance: Normal appearance. She is well-developed and well-groomed. She is obese.  HENT:     Head: Normocephalic and atraumatic.  Cardiovascular:     Rate and Rhythm: Normal rate and regular rhythm.     Heart sounds: Normal heart sounds. No murmur heard.   Pulmonary:     Effort: Pulmonary effort is normal.     Breath sounds: Normal breath sounds.  Abdominal:     Tenderness: There is no abdominal tenderness.  Skin:    General: Skin is warm and dry.  Neurological:     General: No focal deficit present.     Mental Status: She is alert and oriented to person, place, and time. Mental status is at baseline.     Gait: Gait normal.  Psychiatric:        Attention and Perception: Attention and perception normal.        Mood and Affect: Mood and affect normal.        Speech: Speech normal.        Behavior: Behavior normal. Behavior is cooperative.        Thought Content: Thought content normal.        Cognition and Memory: Cognition and memory normal.        Judgment: Judgment normal.     Assessment  Plan  Acute cystitis without hematuria - Plan: Urinalysis, Routine w reflex microscopic, Urine Culture Overactive bladder - Plan: oxybutynin (DITROPAN XL) 15 MG 24 hr tablet  Consider urogyn Dr. Sherlene Shams urology   HM No further vaccines I.e FLU (had GBS after flu vaccine) due to GBS flu shot had 05/31/18. She had had flu shot prev and no sx's. Tdap per pt had 5 years ago get record pt to get record  Prevnar, pna 23, shingles vaccines  -pt to track down records.  Pfizer 3/3 not had booster tolerated   Former smoker max 1 ppd from age 70 to 35 no FH lung cancer   Out of age window pap s/p total hysterectomy  02/15/18 for ovarian cancer with b/l ovaries and left fallopian tube effected negative b/l lymph nodes 3 LN bx'ed   02/06/2019 negativecologaurd -never had colonoscopy  Mammogram per pt had 6 months ago in [place] requested records today  -mammogram 10/19/20 negative  ordered  DEXA +osteopenia   rec healthy diet and exercise   Provider: Dr. Olivia Mackie McLean-Scocuzza-Internal Medicine

## 2021-01-05 NOTE — Patient Instructions (Addendum)
Vitamin D low rec D3 2000 IU daily otc max 4000 IU daily (total) otc with calcium 600 mg daily max 1200 mg calcium  This maybe in a multivitamin for women I.e nature made  Call [facility] they have physical therapy for knees and hips   If you keep getting UTI consider Dr. Sherlene Shams (urogynecology)  Hip Exercises Ask your health care provider which exercises are safe for you. Do exercises exactly as told by your health care provider and adjust them as directed. It is normal to feel mild stretching, pulling, tightness, or discomfort as you do these exercises. Stop right away if you feel sudden pain or your pain gets worse. Do not begin these exercises until told by your health care provider. Stretching and range-of-motion exercises These exercises warm up your muscles and joints and improve the movement and flexibility of your hip. These exercises also help to relieve pain, numbness, and tingling. You may be asked to limit your range of motion if you had a hip replacement. Talk to your health care provider about these restrictions. Hamstrings, supine 1. Lie on your back (supine position). 2. Loop a belt or towel over the ball of your left / right foot. The ball of your foot is on the walking surface, right under your toes. 3. Straighten your left / right knee and slowly pull on the belt or towel to raise your leg until you feel a gentle stretch behind your knee (hamstring). ? Do not let your knee bend while you do this. ? Keep your other leg flat on the floor. 4. Hold this position for __________ seconds. 5. Slowly return your leg to the starting position. Repeat __________ times. Complete this exercise __________ times a day.   Hip rotation 1. Lie on your back on a firm surface. 2. With your left / right hand, gently pull your left / right knee toward the shoulder that is on the same side of the body. Stop when your knee is pointing toward the ceiling. 3. Hold your left /  right ankle with your other hand. 4. Keeping your knee steady, gently pull your left / right ankle toward your other shoulder until you feel a stretch in your buttocks. ? Keep your hips and shoulders firmly planted while you do this stretch. 5. Hold this position for __________ seconds. Repeat __________ times. Complete this exercise __________ times a day.   Seated stretch This exercise is sometimes called hamstrings and adductors stretch. 1. Sit on the floor with your legs stretched wide. Keep your knees straight during this exercise. 2. Keeping your head and back in a straight line, bend at your waist to reach for your left foot (position A). You should feel a stretch in your right inner thigh (adductors). 3. Hold this position for __________ seconds. Then slowly return to the upright position. 4. Keeping your head and back in a straight line, bend at your waist to reach forward (position B). You should feel a stretch behind both of your thighs and knees (hamstrings). 5. Hold this position for __________ seconds. Then slowly return to the upright position. 6. Keeping your head and back in a straight line, bend at your waist to reach for your right foot (position C). You should feel a stretch in your left inner thigh (adductors). 7. Hold this position for __________ seconds. Then slowly return to the upright position. Repeat __________ times. Complete this exercise __________ times a day.   Lunge This exercise stretches the  muscles of the hip (hip flexors). 1. Place your left / right knee on the floor and bend your other knee so that is directly over your ankle. You should be half-kneeling. 2. Keep good posture with your head over your shoulders. 3. Tighten your buttocks to point your tailbone downward. This will prevent your back from arching too much. 4. You should feel a gentle stretch in the front of your left / right thigh and hip. If you do not feel a stretch, slide your other foot  forward slightly and then slowly lunge forward with your chest up until your knee once again lines up over your ankle. ? Make sure your tailbone continues to point downward. 5. Hold this position for __________ seconds. 6. Slowly return to the starting position. Repeat __________ times. Complete this exercise __________ times a day.   Strengthening exercises These exercises build strength and endurance in your hip. Endurance is the ability to use your muscles for a long time, even after they get tired. Bridge This exercise strengthens the muscles of your hip (hip extensors). 1. Lie on your back on a firm surface with your knees bent and your feet flat on the floor. 2. Tighten your buttocks muscles and lift your bottom off the floor until the trunk of your body and your hips are level with your thighs. ? Do not arch your back. ? You should feel the muscles working in your buttocks and the back of your thighs. If you do not feel these muscles, slide your feet 1-2 inches (2.5-5 cm) farther away from your buttocks. 3. Hold this position for __________ seconds. 4. Slowly lower your hips to the starting position. 5. Let your muscles relax completely between repetitions. Repeat __________ times. Complete this exercise __________ times a day.   Straight leg raises, side-lying This exercise strengthens the muscles that move the hip joint away from the center of the body (hip abductors). 1. Lie on your side with your left / right leg in the top position. Lie so your head, shoulder, hip, and knee line up. You may bend your bottom knee slightly to help you balance. 2. Roll your hips slightly forward, so your hips are stacked directly over each other and your left / right knee is facing forward. 3. Leading with your heel, lift your top leg 4-6 inches (10-15 cm). You should feel the muscles in your top hip lifting. ? Do not let your foot drift forward. ? Do not let your knee roll toward the  ceiling. 4. Hold this position for __________ seconds. 5. Slowly return to the starting position. 6. Let your muscles relax completely between repetitions. Repeat __________ times. Complete this exercise __________ times a day.   Straight leg raises, side-lying This exercise strengthens the muscles that move the hip joint toward the center of the body (hip adductors). 1. Lie on your side with your left / right leg in the bottom position. Lie so your head, shoulder, hip, and knee line up. You may place your upper foot in front to help you balance. 2. Roll your hips slightly forward, so your hips are stacked directly over each other and your left / right knee is facing forward. 3. Tense the muscles in your inner thigh and lift your bottom leg 4-6 inches (10-15 cm). 4. Hold this position for __________ seconds. 5. Slowly return to the starting position. 6. Let your muscles relax completely between repetitions. Repeat __________ times. Complete this exercise __________ times a day.  Straight leg raises, supine This exercise strengthens the muscles in the front of your thigh (quadriceps). 1. Lie on your back (supine position) with your left / right leg extended and your other knee bent. 2. Tense the muscles in the front of your left / right thigh. You should see your kneecap slide up or see increased dimpling just above your knee. 3. Keep these muscles tight as you raise your leg 4-6 inches (10-15 cm) off the floor. Do not let your knee bend. 4. Hold this position for __________ seconds. 5. Keep these muscles tense as you lower your leg. 6. Relax the muscles slowly and completely between repetitions. Repeat __________ times. Complete this exercise __________ times a day.   Hip abductors, standing This exercise strengthens the muscles that move the leg and hip joint away from the center of the body (hip abductors). 1. Tie one end of a rubber exercise band or tubing to a secure surface, such as a  chair, table, or pole. 2. Loop the other end of the band or tubing around your left / right ankle. 3. Keeping your ankle with the band or tubing directly opposite the secured end, step away until there is tension in the tubing or band. Hold on to a chair, table, or pole as needed for balance. 4. Lift your left / right leg out to your side. While you do this: ? Keep your back upright. ? Keep your shoulders over your hips. ? Keep your toes pointing forward. ? Make sure to use your hip muscles to slowly lift your leg. Do not tip your body or forcefully lift your leg. 5. Hold this position for __________ seconds. 6. Slowly return to the starting position. Repeat __________ times. Complete this exercise __________ times a day. Squats This exercise strengthens the muscles in the front of your thigh (quadriceps). 1. Stand in a door frame so your feet and knees are in line with the frame. You may place your hands on the frame for balance. 2. Slowly bend your knees and lower your hips like you are going to sit in a chair. ? Keep your lower legs in a straight-up-and-down position. ? Do not let your hips go lower than your knees. ? Do not bend your knees lower than told by your health care provider. ? If your hip pain increases, do not bend as low. 3. Hold this position for ___________ seconds. 4. Slowly push with your legs to return to standing. Do not use your hands to pull yourself to standing. Repeat __________ times. Complete this exercise __________ times a day. This information is not intended to replace advice given to you by your health care provider. Make sure you discuss any questions you have with your health care provider. Document Revised: 03/20/2019 Document Reviewed: 06/25/2018 Elsevier Patient Education  [employer].  Knee Exercises Ask your health care provider which exercises are safe for you. Do exercises exactly as told by your health care provider and adjust them as  directed. It is normal to feel mild stretching, pulling, tightness, or discomfort as you do these exercises. Stop right away if you feel sudden pain or your pain gets worse. Do not begin these exercises until told by your health care provider. Stretching and range-of-motion exercises These exercises warm up your muscles and joints and improve the movement and flexibility of your knee. These exercises also help to relieve pain and swelling. Knee extension, prone 1. Lie on your abdomen (prone position) on a bed.  2. Place your left / right knee just beyond the edge of the surface so your knee is not on the bed. You can put a towel under your left / right thigh just above your kneecap for comfort. 3. Relax your leg muscles and allow gravity to straighten your knee (extension). You should feel a stretch behind your left / right knee. 4. Hold this position for __________ seconds. 5. Scoot up so your knee is supported between repetitions. Repeat __________ times. Complete this exercise __________ times a day. Knee flexion, active 1. Lie on your back with both legs straight. If this causes back discomfort, bend your left / right knee so your foot is flat on the floor. 2. Slowly slide your left / right heel back toward your buttocks. Stop when you feel a gentle stretch in the front of your knee or thigh (flexion). 3. Hold this position for __________ seconds. 4. Slowly slide your left / right heel back to the starting position. Repeat __________ times. Complete this exercise __________ times a day.   Quadriceps stretch, prone 1. Lie on your abdomen on a firm surface, such as a bed or padded floor. 2. Bend your left / right knee and hold your ankle. If you cannot reach your ankle or pant leg, loop a belt around your foot and grab the belt instead. 3. Gently pull your heel toward your buttocks. Your knee should not slide out to the side. You should feel a stretch in the front of your thigh and knee  (quadriceps). 4. Hold this position for __________ seconds. Repeat __________ times. Complete this exercise __________ times a day.   Hamstring, supine 1. Lie on your back (supine position). 2. Loop a belt or towel over the ball of your left / right foot. The ball of your foot is on the walking surface, right under your toes. 3. Straighten your left / right knee and slowly pull on the belt to raise your leg until you feel a gentle stretch behind your knee (hamstring). ? Do not let your knee bend while you do this. ? Keep your other leg flat on the floor. 4. Hold this position for __________ seconds. Repeat __________ times. Complete this exercise __________ times a day. Strengthening exercises These exercises build strength and endurance in your knee. Endurance is the ability to use your muscles for a long time, even after they get tired. Quadriceps, isometric This exercise stretches the muscles in front of your thigh (quadriceps) without moving your knee joint (isometric). 1. Lie on your back with your left / right leg extended and your other knee bent. Put a rolled towel or small pillow under your knee if told by your health care provider. 2. Slowly tense the muscles in the front of your left / right thigh. You should see your kneecap slide up toward your hip or see increased dimpling just above the knee. This motion will push the back of the knee toward the floor. 3. For __________ seconds, hold the muscle as tight as you can without increasing your pain. 4. Relax the muscles slowly and completely. Repeat __________ times. Complete this exercise __________ times a day.   Straight leg raises This exercise stretches the muscles in front of your thigh (quadriceps) and the muscles that move your hips (hip flexors). 1. Lie on your back with your left / right leg extended and your other knee bent. 2. Tense the muscles in the front of your left / right thigh. You should see  your kneecap slide up  or see increased dimpling just above the knee. Your thigh may even shake a bit. 3. Keep these muscles tight as you raise your leg 4-6 inches (10-15 cm) off the floor. Do not let your knee bend. 4. Hold this position for __________ seconds. 5. Keep these muscles tense as you lower your leg. 6. Relax your muscles slowly and completely after each repetition. Repeat __________ times. Complete this exercise __________ times a day. Hamstring, isometric 1. Lie on your back on a firm surface. 2. Bend your left / right knee about __________ degrees. 3. Dig your left / right heel into the surface as if you are trying to pull it toward your buttocks. Tighten the muscles in the back of your thighs (hamstring) to "dig" as hard as you can without increasing any pain. 4. Hold this position for __________ seconds. 5. Release the tension gradually and allow your muscles to relax completely for __________ seconds after each repetition. Repeat __________ times. Complete this exercise __________ times a day. Hamstring curls If told by your health care provider, do this exercise while wearing ankle weights. Begin with __________ lb weights. Then increase the weight by 1 lb (0.5 kg) increments. Do not wear ankle weights that are more than __________ lb. 1. Lie on your abdomen with your legs straight. 2. Bend your left / right knee as far as you can without feeling pain. Keep your hips flat against the floor. 3. Hold this position for __________ seconds. 4. Slowly lower your leg to the starting position. Repeat __________ times. Complete this exercise __________ times a day.   Squats This exercise strengthens the muscles in front of your thigh and knee (quadriceps). 1. Stand in front of a table, with your feet and knees pointing straight ahead. You may rest your hands on the table for balance but not for support. 2. Slowly bend your knees and lower your hips like you are going to sit in a chair. ? Keep your weight  over your heels, not over your toes. ? Keep your lower legs upright so they are parallel with the table legs. ? Do not let your hips go lower than your knees. ? Do not bend lower than told by your health care provider. ? If your knee pain increases, do not bend as low. 3. Hold the squat position for __________ seconds. 4. Slowly push with your legs to return to standing. Do not use your hands to pull yourself to standing. Repeat __________ times. Complete this exercise __________ times a day. Wall slides This exercise strengthens the muscles in front of your thigh and knee (quadriceps). 1. Lean your back against a smooth wall or door, and walk your feet out 18-24 inches (46-61 cm) from it. 2. Place your feet hip-width apart. 3. Slowly slide down the wall or door until your knees bend __________ degrees. Keep your knees over your heels, not over your toes. Keep your knees in line with your hips. 4. Hold this position for __________ seconds. Repeat __________ times. Complete this exercise __________ times a day.   Straight leg raises This exercise strengthens the muscles that rotate the leg at the hip and move it away from your body (hip abductors). 1. Lie on your side with your left / right leg in the top position. Lie so your head, shoulder, knee, and hip line up. You may bend your bottom knee to help you keep your balance. 2. Roll your hips slightly forward so your  hips are stacked directly over each other and your left / right knee is facing forward. 3. Leading with your heel, lift your top leg 4-6 inches (10-15 cm). You should feel the muscles in your outer hip lifting. ? Do not let your foot drift forward. ? Do not let your knee roll toward the ceiling. 4. Hold this position for __________ seconds. 5. Slowly return your leg to the starting position. 6. Let your muscles relax completely after each repetition. Repeat __________ times. Complete this exercise __________ times a day.    Straight leg raises This exercise stretches the muscles that move your hips away from the front of the pelvis (hip extensors). 1. Lie on your abdomen on a firm surface. You can put a pillow under your hips if that is more comfortable. 2. Tense the muscles in your buttocks and lift your left / right leg about 4-6 inches (10-15 cm). Keep your knee straight as you lift your leg. 3. Hold this position for __________ seconds. 4. Slowly lower your leg to the starting position. 5. Let your leg relax completely after each repetition. Repeat __________ times. Complete this exercise __________ times a day. This information is not intended to replace advice given to you by your health care provider. Make sure you discuss any questions you have with your health care provider. Document Revised: 06/04/2018 Document Reviewed: 06/04/2018 Elsevier Patient Education  2021 [employer].

## 2021-01-06 LAB — CA 125: Cancer Antigen (CA) 125: 16.4 U/mL (ref 0.0–38.1)

## 2021-01-08 LAB — URINALYSIS, ROUTINE W REFLEX MICROSCOPIC
Bilirubin, UA: NEGATIVE
Glucose, UA: NEGATIVE
Ketones, UA: NEGATIVE
Leukocytes,UA: NEGATIVE
Nitrite, UA: NEGATIVE
Protein,UA: NEGATIVE
RBC, UA: NEGATIVE
Specific Gravity, UA: 1.019 (ref 1.005–1.030)
Urobilinogen, Ur: 0.2 mg/dL (ref 0.2–1.0)
pH, UA: 5.5 (ref 5.0–7.5)

## 2021-01-08 LAB — URINE CULTURE: Organism ID, Bacteria: NO GROWTH

## 2021-01-10 ENCOUNTER — Other Ambulatory Visit: Payer: Self-pay

## 2021-01-10 ENCOUNTER — Ambulatory Visit: Payer: Medicare Other | Attending: Nurse Practitioner

## 2021-02-10 ENCOUNTER — Telehealth: Payer: Self-pay | Admitting: Internal Medicine

## 2021-02-10 NOTE — Telephone Encounter (Signed)
Patient last seen 12/2020. If okay for referral please send back and I will place the order in the last visit.

## 2021-02-10 NOTE — Telephone Encounter (Signed)
Why is she requesting to see her?

## 2021-02-10 NOTE — Telephone Encounter (Signed)
Patient called and is requesting a referral to Dr. Rosine Beat, pulmonary.

## 2021-02-14 ENCOUNTER — Ambulatory Visit: Admission: RE | Admit: 2021-02-14 | Payer: Medicare Other | Source: Ambulatory Visit

## 2021-02-14 NOTE — Telephone Encounter (Signed)
Please advise 

## 2021-02-14 NOTE — Addendum Note (Signed)
Addended by: Orland Mustard on: 02/14/2021 03:04 PM   Modules accepted: Orders

## 2021-02-14 NOTE — Telephone Encounter (Signed)
Referrals placed just now but I needed a reason to attach to referral reason for delay  Also referred to cardiology as sob could be lung or cardiac  Also she missed her appt by Dr. Theora Gianotti for CT chest/abdomen/pelvis call Dr. Roxy Horseman office to get these rescheduled they were 01/05/21

## 2021-02-14 NOTE — Telephone Encounter (Signed)
PT called to followup on the referral to the [facility]. She advise she wanted to see this Dr because last Monday her Daughter had observed her on a trip and stated that she was having shortness of breath.

## 2021-02-14 NOTE — Telephone Encounter (Signed)
noted 

## 2021-02-15 ENCOUNTER — Telehealth: Payer: Self-pay | Admitting: Pulmonary Disease

## 2021-02-15 DIAGNOSIS — L648 Other androgenic alopecia: Secondary | ICD-10-CM | POA: Diagnosis not present

## 2021-02-15 DIAGNOSIS — Z7689 Persons encountering health services in other specified circumstances: Secondary | ICD-10-CM | POA: Diagnosis not present

## 2021-02-15 DIAGNOSIS — D485 Neoplasm of uncertain behavior of skin: Secondary | ICD-10-CM | POA: Diagnosis not present

## 2021-02-15 DIAGNOSIS — Z79899 Other long term (current) drug therapy: Secondary | ICD-10-CM | POA: Diagnosis not present

## 2021-02-15 NOTE — Telephone Encounter (Signed)
I have verbally made Melissa aware of this. Melissa will contact patient to schedule.

## 2021-02-15 NOTE — Telephone Encounter (Signed)
Should be ok after review of records.

## 2021-02-15 NOTE — Telephone Encounter (Signed)
Dr. Patsey Berthold, please review urgent referral.   Is august okay?

## 2021-03-04 ENCOUNTER — Other Ambulatory Visit: Payer: Self-pay

## 2021-03-04 ENCOUNTER — Ambulatory Visit
Admission: RE | Admit: 2021-03-04 | Discharge: 2021-03-04 | Disposition: A | Discharge status: Home / Self Care | Payer: Medicare Other | Source: Ambulatory Visit | Attending: Nurse Practitioner | Admitting: Nurse Practitioner

## 2021-03-04 DIAGNOSIS — C561 Malignant neoplasm of right ovary: Secondary | ICD-10-CM | POA: Insufficient documentation

## 2021-03-04 DIAGNOSIS — M160 Bilateral primary osteoarthritis of hip: Secondary | ICD-10-CM | POA: Diagnosis not present

## 2021-03-04 DIAGNOSIS — J8410 Pulmonary fibrosis, unspecified: Secondary | ICD-10-CM | POA: Diagnosis not present

## 2021-03-04 DIAGNOSIS — K449 Diaphragmatic hernia without obstruction or gangrene: Secondary | ICD-10-CM | POA: Diagnosis not present

## 2021-03-04 DIAGNOSIS — N281 Cyst of kidney, acquired: Secondary | ICD-10-CM | POA: Diagnosis not present

## 2021-03-04 DIAGNOSIS — Z8543 Personal history of malignant neoplasm of ovary: Secondary | ICD-10-CM | POA: Diagnosis not present

## 2021-03-04 DIAGNOSIS — J984 Other disorders of lung: Secondary | ICD-10-CM | POA: Diagnosis not present

## 2021-03-30 ENCOUNTER — Encounter: Payer: Self-pay | Admitting: Internal Medicine

## 2021-03-30 ENCOUNTER — Other Ambulatory Visit: Payer: Self-pay

## 2021-03-30 ENCOUNTER — Ambulatory Visit (INDEPENDENT_AMBULATORY_CARE_PROVIDER_SITE_OTHER): Payer: Medicare Other | Admitting: Internal Medicine

## 2021-03-30 VITALS — BP 118/70 | HR 91 | Temp 97.9°F | Ht 66.0 in | Wt 188.4 lb

## 2021-03-30 DIAGNOSIS — R0602 Shortness of breath: Secondary | ICD-10-CM | POA: Diagnosis not present

## 2021-03-30 NOTE — Progress Notes (Signed)
[facility] Consultation      Date: 03/30/2021,   MRN# [id number] Melissa Anderson March 17, 1941     Admission                  Current  Melissa Anderson is a 80 y.o. old female seen in consultation for SOB at the request of [facility].     CHIEF COMPLAINT:   SHORTNESS OF BREATH   HISTORY OF PRESENT ILLNESS  80 y.o. 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, bilateral pelvic lymphadenectomy and omental/peritoneal biopsies on 02/15/18 at [facility] followed by 4 cycles of carbo-taxol chemotherapy 03/22/18-06/03/2018  Sent to [facility] to assess SOB  CT of the chest October 2021 reviewed with patient No significant masses or lesions seen however does have bilateral bronchiectasis     In 2019 patient had received 1 dose of chemoradiation next day received flu shot and then progressively got worse with paralysis of the lower extremities and was diagnosed with Guillain-Barr syndrome and was treated at [facility] supposedly spent 10 days in the ICU on the ventilator Patient took several weeks to recover  Currently patient has some shortness of breath mostly with exertion She does not have any wheezing No signs of infection  No exacerbation at this time No evidence of heart failure at this time No evidence or signs of infection at this time No respiratory distress No fevers, chills, nausea, vomiting, diarrhea No evidence of lower extremity edema No evidence hemoptysis  Patient does have a history of uterine cancer treated at [facility] At this point she has no significant respiratory issues or shortness of breath Her daughter is International aid/development worker No cough no wheezing No allergies Smoked 30 years ago 1 pack a day for 30 years No history of vaping  Diagnosis of pneumonia when she was 80 years old No previous history of COVID-19 infection She has decreased exercise tolerance She is supposed to have a hip  replacement She takes about 15-20 steps before she gets short of breath  No signs of infection at this time She does not take any inhalers  PAST MEDICAL HISTORY   Past Medical History:  Diagnosis Date   Arthritis    right shoulder   Cancer ([place]) 2019   b/l ovaries and left fallopian tube pelvic lymph nodes negative    Guillain Barr syndrome ([place])    2/2 flu shot 05/2018,  intubated x 8 days   Hyperlipidemia    Overactive bladder    Thyroid disease    UTI (urinary tract infection)    Vitamin D deficiency      SURGICAL HISTORY   Past Surgical History:  Procedure Laterality Date   ABDOMINAL HYSTERECTOMY     02/15/18    CHOLECYSTECTOMY  2008   LAPAROSCOPIC VAGINAL HYSTERECTOMY WITH SALPINGO OOPHORECTOMY  02/15/2018   PORTACATH PLACEMENT Left 02/25/2018   Procedure: INSERTION PORT-A-CATH;  Surgeon: Robert Bellow, MD;  Location: [facility] ORS;  Service: General;  Laterality: Left;   TOTAL ABDOMINAL HYSTERECTOMY W/ BILATERAL SALPINGOOPHORECTOMY  02/15/2018   lymph nodes removed     FAMILY HISTORY   Family History  Problem Relation Age of Onset   Sudden death Mother 52       drowning in hurricane Katrina   Pancreatic cancer Father        deceased early 45s; smoker   Alcohol abuse Father    COPD Father    Coronary artery disease Brother  Melanoma Sister        deceased at 17   COPD Sister    Prostate cancer Maternal Uncle        deceased 34   Breast cancer Paternal Aunt        unsure of this dx     SOCIAL HISTORY   Social History   Tobacco Use   Smoking status: Former    Packs/day: 1.00    Years: 25.00    Pack years: 25.00    Types: Cigarettes    Quit date: 02/26/1984    Years since quitting: 37.1   Smokeless tobacco: Never   Tobacco comments:    age 60 to 44 1 ppd   Vaping Use   Vaping Use: Never used  Substance Use Topics   Alcohol use: Never   Drug use: Never     MEDICATIONS    Home Medication:  Current Outpatient Rx   Order #:  [id number] Class: Historical Med   Order #: [id number] Class: Historical Med   Order #: [id number] Class: Historical Med   Order #: [id number] Class: Historical Med   Order #: [id number] Class: Historical Med   Order #: [id number] Class: Normal   Order #: [id number] Class: Normal   Order #: [id number] Class: Historical Med    Current Medication:  Current Outpatient Medications:    amoxicillin (AMOXIL) 500 MG capsule, Take 500 mg by mouth 3 (three) times daily. (Patient not taking: Reported on 01/05/2021), Disp: , Rfl:    aspirin EC 81 MG tablet, Take 81 mg by mouth daily., Disp: , Rfl:    Cholecalciferol (VITAMIN D3 PO), Take 1,000 mg by mouth., Disp: , Rfl:    diclofenac (VOLTAREN) 75 MG EC tablet, Take 75 mg by mouth 2 (two) times daily., Disp: , Rfl:    finasteride (PROSCAR) 5 MG tablet, Take 5 mg by mouth daily., Disp: , Rfl:    oxybutynin (DITROPAN XL) 15 MG 24 hr tablet, Take 1 tablet (15 mg total) by mouth at bedtime., Disp: 90 tablet, Rfl: 3   SYNTHROID 175 MCG tablet, Take 1 tablet (175 mcg total) by mouth daily., Disp: 90 tablet, Rfl: 3   Vitamin D, Ergocalciferol, (DRISDOL) 1.25 MG (50000 UNIT) CAPS capsule, Take 50,000 Units by mouth once a week., Disp: , Rfl:     ALLERGIES   Crestor [rosuvastatin], Epinephrine, Influenza vaccines, and Lipitor [atorvastatin calcium]     REVIEW OF SYSTEMS    Review of Systems:  Gen:  Denies  fever, sweats, chills weigh loss  HEENT: Denies blurred vision, double vision, ear pain, eye pain, hearing loss, nose bleeds, sore throat Cardiac:  No dizziness, chest pain or heaviness, chest tightness,edema Resp:   Denies cough or sputum porduction, shortness of breath,wheezing, hemoptysis,  Gi: Denies swallowing difficulty, stomach pain, nausea or vomiting, diarrhea, constipation, bowel incontinence Gu:  Denies bladder incontinence, burning urine Ext:   Denies Joint pain, stiffness or swelling Skin: Denies  skin rash, easy bruising or bleeding or  hives Endoc:  Denies polyuria, polydipsia , polyphagia or weight change Psych:   Denies depression, insomnia or hallucinations   Other:  All other systems negative  BP 118/70 (BP Location: Left Arm, Patient Position: Sitting, Cuff Size: Normal)   Pulse 91   Temp 97.9 F (36.6 C) (Oral)   Ht '5\' 6"'$  (1.676 m)   Wt 188 lb 6.4 oz (85.5 kg)   BMI 30.41 kg/m    Physical Examination:   General Appearance: No distress  EYES PERRLA, EOM intact.   NECK Supple, No  JVD Pulmonary: normal breath sounds, No wheezing.  CardiovascularNormal S1,S2.  No m/r/g.   Abdomen: Benign, Soft, non-tender. Skin:   warm, no rashes, no ecchymosis  Extremities: normal, no cyanosis, clubbing. Neuro:without focal findings,  speech normal  PSYCHIATRIC: Mood, affect within normal limits.   ALL OTHER ROS ARE NEGATIVE      IMAGING    CT CHEST ABDOMEN PELVIS WO CONTRAST  Result Date: 03/04/2021 CLINICAL DATA:  History of ovarian cancer status post hysterectomy, mental biopsies and chemotherapy, discontinue 06/03/2018. Surveillance EXAM: CT CHEST, ABDOMEN AND PELVIS WITHOUT CONTRAST TECHNIQUE: Multidetector CT imaging of the chest, abdomen and pelvis was performed following the standard protocol without IV contrast. COMPARISON:  Prior CTs 06/01/2020 and 08/16/2018. FINDINGS: CT CHEST FINDINGS Cardiovascular: Minimal aortic atherosclerosis. The heart size is normal. There is no pericardial effusion. Mediastinum/Nodes: There are no enlarged mediastinal, hilar or axillary lymph nodes. No definite visualized thyroid tissue. Small hiatal hernia. Lungs/Pleura: There is no pleural effusion. Stable chronic central airway thickening with chronic linear scarring and volume loss in the right middle lobe. Chronic linear scarring in the left lower lobe. Punctate calcified granuloma in the left upper lobe. No suspicious pulmonary nodules. Musculoskeletal/Chest wall: No chest wall mass or suspicious osseous findings. CT ABDOMEN AND  PELVIS FINDINGS Hepatobiliary: No focal hepatic lesions are identified on noncontrast imaging. No significant biliary dilatation status post cholecystectomy. Pancreas: Unremarkable. No pancreatic ductal dilatation or surrounding inflammatory changes. Spleen: Normal in size without focal abnormality. Adrenals/Urinary Tract: Both adrenal glands appear normal. Bilateral renal sinus cysts are similar to previous study. No evidence of urinary tract calculus, hydronephrosis or perinephric soft tissue stranding. Mild pelvic floor laxity. The bladder otherwise appears unremarkable. Stomach/Bowel: Enteric contrast was administered and has passed into the distal small bowel. The stomach appears unremarkable for its degree of distension. No evidence of bowel wall thickening, distention or surrounding inflammatory change. The appendix appears normal. Moderate stool throughout the colon. Vascular/Lymphatic: There are no enlarged abdominal or pelvic lymph nodes. Minimal aortic and branch vessel atherosclerosis. Reproductive: Hysterectomy.  No adnexal mass. Other: No ascites or peritoneal nodularity.  Intact abdominal wall. Musculoskeletal: No acute or significant osseous findings. Mild lower lumbar spondylosis. Degenerative changes of both hips, worse on the right. IMPRESSION: 1. Stable CTs of the chest, abdomen and pelvis status post hysterectomy. No evidence of local recurrence or metastatic disease. 2. Stable pulmonary scarring and chronic central airway thickening. 3. Stable incidental findings including renal sinus parapelvic cysts, probable pelvic floor laxity and mild Aortic Atherosclerosis (ICD10-I70.0). Electronically Signed   By: Richardean Sale M.D.   On: 03/04/2021 16:51      ASSESSMENT/PLAN   80 year old pleasant white female seen today for assessment for shortness of breath Patient with a previous history of diagnosis of Guillain-Barr syndrome with ascending paralysis in the setting of a non-smoker and  bilateral bronchiectasis  At this time I would recommend obtaining pulmonary function testing with negative inspiratory force along with assessing for exertional hypoxia with 6-minute walk test and also to assess for nocturnal hypoxia with overnight pulse oximetry  I also would recommend incentive spirometry 10-15 times per day No indication for inhalers at this time No indication for antibiotics or prednisone at this time  I have advised patient to exercise as tolerated Increase activity as tolerated   MEDICATION ADJUSTMENTS/LABS AND TESTS ORDERED: OBTAIN PULMONARY FUNCTION TESTING OBTAIN 6 MWT OBTAIN Overnight pulse oximetry  INCENTIVE SPIROMETRY 10 times per day   CURRENT MEDICATIONS REVIEWED AT [place]  Patient  satisfied with Plan of action and management. All questions answered  Follow up 3 months  Total Time Spent  50 mins   Melissa Anderson, M.D.  [facility]  Medical Director [place] Director [facility] Cardio-Pulmonary Department

## 2021-03-30 NOTE — Patient Instructions (Addendum)
OBTAIN PULMONARY FUNCTION TESTING OBTAIN 6 MWT OBTAIN Overnight pulse oximetry  INCENTIVE SPIROMETRY 10 times per day

## 2021-04-01 DIAGNOSIS — C44311 Basal cell carcinoma of skin of nose: Secondary | ICD-10-CM | POA: Diagnosis not present

## 2021-04-04 ENCOUNTER — Telehealth: Payer: Self-pay | Admitting: Internal Medicine

## 2021-04-04 NOTE — Telephone Encounter (Signed)
Patient's PFT has been rescheduled to 04/12/21 '@3'$ :00pm and Covid Test rescheduled to 04/11/21

## 2021-04-05 ENCOUNTER — Other Ambulatory Visit: Payer: Medicare Other

## 2021-04-06 ENCOUNTER — Telehealth: Payer: Self-pay

## 2021-04-06 ENCOUNTER — Ambulatory Visit: Payer: Medicare Other

## 2021-04-06 NOTE — Telephone Encounter (Signed)
Called and spoke to patient about upcoming covid test on 8/15. Patient had a clear understanding nothing further needed.

## 2021-04-11 ENCOUNTER — Encounter: Payer: Self-pay | Admitting: Urgent Care

## 2021-04-11 ENCOUNTER — Other Ambulatory Visit: Payer: Self-pay

## 2021-04-11 ENCOUNTER — Other Ambulatory Visit
Admission: RE | Admit: 2021-04-11 | Discharge: 2021-04-11 | Disposition: A | Discharge status: Home / Self Care | Payer: Medicare Other | Source: Ambulatory Visit | Attending: Internal Medicine | Admitting: Internal Medicine

## 2021-04-11 DIAGNOSIS — Z01812 Encounter for preprocedural laboratory examination: Secondary | ICD-10-CM | POA: Insufficient documentation

## 2021-04-11 DIAGNOSIS — Z20822 Contact with and (suspected) exposure to covid-19: Secondary | ICD-10-CM | POA: Insufficient documentation

## 2021-04-11 HISTORY — DX: Atherosclerosis of aorta: I70.0

## 2021-04-12 ENCOUNTER — Ambulatory Visit: Payer: Medicare Other | Attending: Internal Medicine

## 2021-04-12 DIAGNOSIS — R0602 Shortness of breath: Secondary | ICD-10-CM | POA: Diagnosis not present

## 2021-04-12 DIAGNOSIS — J984 Other disorders of lung: Secondary | ICD-10-CM | POA: Diagnosis not present

## 2021-04-12 LAB — SARS CORONAVIRUS 2 (TAT 6-24 HRS): SARS Coronavirus 2: NEGATIVE

## 2021-04-13 ENCOUNTER — Encounter: Payer: Self-pay | Admitting: Internal Medicine

## 2021-04-13 ENCOUNTER — Ambulatory Visit: Payer: Medicare Other

## 2021-04-13 DIAGNOSIS — G4730 Sleep apnea, unspecified: Secondary | ICD-10-CM | POA: Diagnosis not present

## 2021-04-13 DIAGNOSIS — R0683 Snoring: Secondary | ICD-10-CM | POA: Diagnosis not present

## 2021-04-17 NOTE — Progress Notes (Signed)
MRN : [id number]  Melissa Anderson is a 80 y.o. (30-Oct-1940) female who presents with chief complaint of leg swelling.  History of Present Illness:   The patient returns to the office for followup evaluation regarding leg swelling.  The swelling has persisted but with the lymph pump is much, much better controlled. The pain associated with swelling is essentially eliminated. There have not been any interval development of a ulcerations or wounds.  The patient denies problems with the pump, noting it is working well and the leggings are in good condition.  However, she is concerned because her leggings only come to the mid thigh but a person that she is familiar with has a machine where the leggings go all the way to the groin.  She is disappointed that hers are not similar and feels that she should have been measured more accurately.  Since the previous visit the patient has been wearing graduated compression stockings and using the lymph pump on a routine basis and  has noted significant improvement in the lymphedema.   Patient stated that overall the lymph pump has been a very positive factor in her care.    No outpatient medications have been marked as taking for the 04/18/21 encounter (Appointment) with Delana Meyer, Dolores Lory, MD.    Past Medical History:  Diagnosis Date   Aortic atherosclerosis ([place])    Arthritis    right shoulder   Cancer ([place]) 2019   b/l ovaries and left fallopian tube pelvic lymph nodes negative    Guillain Barr syndrome ([place])    2/2 flu shot 05/2018,  intubated x 8 days   Hyperlipidemia    Overactive bladder    Thyroid disease    UTI (urinary tract infection)    Vitamin D deficiency     Past Surgical History:  Procedure Laterality Date   ABDOMINAL HYSTERECTOMY     02/15/18    CHOLECYSTECTOMY  2008   LAPAROSCOPIC VAGINAL HYSTERECTOMY WITH SALPINGO OOPHORECTOMY  02/15/2018   PORTACATH PLACEMENT Left 02/25/2018   Procedure: INSERTION PORT-A-CATH;  Surgeon:  Robert Bellow, MD;  Location: [facility] ORS;  Service: General;  Laterality: Left;   TOTAL ABDOMINAL HYSTERECTOMY W/ BILATERAL SALPINGOOPHORECTOMY  02/15/2018   lymph nodes removed    Social History Social History   Tobacco Use   Smoking status: Former    Packs/day: 1.00    Years: 25.00    Pack years: 25.00    Types: Cigarettes    Quit date: 02/26/1984    Years since quitting: 37.1   Smokeless tobacco: Never   Tobacco comments:    age 68 to 67 1 ppd   Vaping Use   Vaping Use: Never used  Substance Use Topics   Alcohol use: Never   Drug use: Never    Family History Family History  Problem Relation Age of Onset   Sudden death Mother 43       drowning in hurricane Katrina   Pancreatic cancer Father        deceased early 30s; smoker   Alcohol abuse Father    COPD Father    Coronary artery disease Brother    Melanoma Sister        deceased at 63   COPD Sister    Prostate cancer Maternal Uncle        deceased 44   Breast cancer Paternal Aunt        unsure of this dx    Allergies  Allergen Reactions  Crestor [Rosuvastatin]     Myopathy     Epinephrine Other (See Comments)    Funny feeling, heart racing    Influenza Vaccines     ?GBS 05/2018    Lipitor [Atorvastatin Calcium]     Myalgias       REVIEW OF SYSTEMS (Negative unless checked)  Constitutional: '[]'$ Weight loss  '[]'$ Fever  '[]'$ Chills Cardiac: '[]'$ Chest pain   '[]'$ Chest pressure   '[]'$ Palpitations   '[]'$ Shortness of breath when laying flat   '[]'$ Shortness of breath with exertion. Vascular:  '[]'$ Pain in legs with walking   '[]'$ Pain in legs at rest  '[]'$ History of DVT   '[]'$ Phlebitis   '[x]'$ Swelling in legs   '[]'$ Varicose veins   '[]'$ Non-healing ulcers Pulmonary:   '[]'$ Uses home oxygen   '[]'$ Productive cough   '[]'$ Hemoptysis   '[]'$ Wheeze  '[]'$ COPD   '[]'$ Asthma Neurologic:  '[]'$ Dizziness   '[]'$ Seizures   '[]'$ History of stroke   '[]'$ History of TIA  '[]'$ Aphasia   '[]'$ Vissual changes   '[]'$ Weakness or numbness in arm   '[]'$ Weakness or numbness in  leg Musculoskeletal:   '[]'$ Joint swelling   '[]'$ Joint pain   '[]'$ Low back pain Hematologic:  '[]'$ Easy bruising  '[]'$ Easy bleeding   '[]'$ Hypercoagulable state   '[]'$ Anemic Gastrointestinal:  '[]'$ Diarrhea   '[]'$ Vomiting  '[]'$ Gastroesophageal reflux/heartburn   '[]'$ Difficulty swallowing. Genitourinary:  '[]'$ Chronic kidney disease   '[]'$ Difficult urination  '[]'$ Frequent urination   '[]'$ Blood in urine Skin:  '[]'$ Rashes   '[]'$ Ulcers  Psychological:  '[]'$ History of anxiety   '[]'$  History of major depression.  Physical Examination  There were no vitals filed for this visit. There is no height or weight on file to calculate BMI. Gen: WD/WN, NAD Head: [place]/AT, No temporalis wasting.  Ear/Nose/Throat: Hearing grossly intact, nares w/o erythema or drainage, pinna without lesions Eyes: PER, EOMI, sclera nonicteric.  Neck: Supple, no gross masses.  No JVD.  Pulmonary:  Good air movement, no audible wheezing, no use of accessory muscles.  Cardiac: RRR, precordium not hyperdynamic. Vascular:  scattered varicosities present bilaterally.  Moderate venous stasis changes to the legs bilaterally.  2+ soft pitting edema  Vessel Right Left  Radial Palpable Palpable  Gastrointestinal: soft, non-distended. No guarding/no peritoneal signs.  Musculoskeletal: M/S 5/5 throughout.  No deformity.  Neurologic: CN 2-12 intact. Pain and light touch intact in extremities.  Symmetrical.  Speech is fluent. Motor exam as listed above. Psychiatric: Judgment intact, Mood & affect appropriate for pt's clinical situation. Dermatologic: Moderate venous rashes no ulcers noted.  No changes consistent with cellulitis. Lymph : No lichenification or skin changes of chronic lymphedema.  CBC Lab Results  Component Value Date   WBC 5.7 09/29/2020   HGB 13.8 09/29/2020   HCT 41.5 09/29/2020   MCV 93.7 09/29/2020   PLT 239.0 09/29/2020    BMET    Component Value Date/Time   NA 143 09/29/2020 0910   K 4.7 09/29/2020 0910   CL 108 09/29/2020 0910   CO2 31  09/29/2020 0910   GLUCOSE 87 09/29/2020 0910   BUN 23 09/29/2020 0910   CREATININE 0.72 09/29/2020 0910   CALCIUM 9.3 09/29/2020 0910   GFRNONAA >60 08/08/2018 0859   GFRAA >60 08/08/2018 0859   CrCl cannot be calculated (Patient's most recent lab result is older than the maximum 21 days allowed.).  COAG No results found for: INR, PROTIME  Radiology Pulmonary Function Test [facility] Only  Result Date: 04/13/2021 Patient had a difficult time performing test The patient was unable to maintain a pant frequency of 60 to 90 breaths  per minute during the airway resistance measurement. Spirometry Data Is NOT Acceptable and Reproducible Moderate Obstructive Airways Disease patient states that she is allergic to ALBUTEROL Moderate Restrictive Lung disease Consider outpatient Pulmonary Consultation if needed Clinical Correlation Advised     Assessment/Plan 1. Lymphedema  No surgery or intervention at this point in time.    I have reviewed my discussion with the patient regarding lymphedema and why it  causes symptoms.  Patient will continue wearing graduated compression stockings class 1 (20-30 mmHg) on a daily basis a prescription was given. The patient is reminded to put the stockings on first thing in the morning and removing them in the evening. The patient is instructed specifically not to sleep in the stockings.   In addition, behavioral modification throughout the day will be continued.  This will include frequent elevation (such as in a recliner), use of over the counter pain medications as needed and exercise such as walking.  I have reviewed systemic causes for chronic edema such as liver, kidney and cardiac etiologies and there does not appear to be any significant changes in these organ systems over the past year.  The patient is under the impression that these organ systems are all stable and unchanged.    The patient will continue aggressive use of the  lymph pump.  This will continue to  improve the edema control and prevent sequela such as ulcers and infections.    I will contact bio tab to see if they can rectify her concerns.  The patient will follow-up with me on an annual basis.    2. Hyperlipidemia, unspecified hyperlipidemia type Continue antihypertensive medications as already ordered, these medications have been reviewed and there are no changes at this time.   3. Aortic atherosclerosis (HCC) Recommend:  I do not find evidence of life style limiting vascular disease. The patient specifically denies life style limitation.  Previous noninvasive studies including ABI's of the legs do not identify critical vascular problems.  The patient should continue walking and begin a more formal exercise program. The patient should continue his antiplatelet therapy and aggressive treatment of the lipid abnormalities.  The patient should begin wearing graduated compression socks 15-20 mmHg strength to control her mild edema.    Hortencia Pilar, MD  04/17/2021 1:52 PM

## 2021-04-18 ENCOUNTER — Other Ambulatory Visit: Payer: Self-pay

## 2021-04-18 ENCOUNTER — Encounter (INDEPENDENT_AMBULATORY_CARE_PROVIDER_SITE_OTHER): Payer: Self-pay | Admitting: Vascular Surgery

## 2021-04-18 ENCOUNTER — Ambulatory Visit (INDEPENDENT_AMBULATORY_CARE_PROVIDER_SITE_OTHER): Payer: Medicare Other | Admitting: Vascular Surgery

## 2021-04-18 VITALS — BP 129/69 | HR 73 | Resp 15 | Wt 187.0 lb

## 2021-04-18 DIAGNOSIS — E785 Hyperlipidemia, unspecified: Secondary | ICD-10-CM | POA: Diagnosis not present

## 2021-04-18 DIAGNOSIS — I890 Lymphedema, not elsewhere classified: Secondary | ICD-10-CM | POA: Diagnosis not present

## 2021-04-18 DIAGNOSIS — I700 Atherosclerosis of aorta: Secondary | ICD-10-CM

## 2021-04-20 ENCOUNTER — Encounter (INDEPENDENT_AMBULATORY_CARE_PROVIDER_SITE_OTHER): Payer: Self-pay | Admitting: Vascular Surgery

## 2021-04-22 ENCOUNTER — Telehealth: Payer: Self-pay | Admitting: Internal Medicine

## 2021-04-22 NOTE — Telephone Encounter (Signed)
ONo reviewed by Dr. Boykin Peek oxygen needed.  Patient is aware of results and voiced her understanding.  She is questioning PFT results. She was told by RT that she would benefit from inhaler.  Dr. Mortimer Fries, please advise. Thanks

## 2021-04-25 NOTE — Telephone Encounter (Signed)
Lm for patient.  

## 2021-04-26 NOTE — Telephone Encounter (Signed)
Patient is aware of results and voiced her understanding. Nothing further needed at this time.

## 2021-05-05 ENCOUNTER — Telehealth: Payer: Self-pay | Admitting: Internal Medicine

## 2021-05-05 NOTE — Telephone Encounter (Signed)
Patient called in stating that her oxybutynin (DITROPAN XL) 15 MG 24 hr tablet for incontinence is not working and she would like to know if Dr.Tracy can switch her medicine.Please advise.

## 2021-05-05 NOTE — Telephone Encounter (Signed)
If persistent symptoms despite medication, would recommend reevaluation.  Also, if more incontinence issue rather than overactive bladder - recommend reevaluation.  Can place order for urology referral if desires.

## 2021-05-06 NOTE — Telephone Encounter (Signed)
Spoke with Dr.Scott and was informed that the patient does need to be evaluated for her bladder issues. The described symptoms are more aligned with a weakening of the bladder. The referral for Urology will be placed per patient request.

## 2021-05-06 NOTE — Telephone Encounter (Signed)
Called and spoke with Melissa Anderson. Melissa Anderson states that she has been on oxybutynin for a while and her urinary issues has gradually gotten worse. Pt was concerned over how bad her urgency was. She states that she can not hold her urine. She is fine sitting down, but once she stands up she immediately urinates with no warning. Pt states that it was not this bad before and she requests something stronger than what she has now. Pt declines an appointment due to going out of town tomorrow morning and she will be out of town until 05/13/21 late at night and then will go out of town again for 21 days in the following week. Pt is agreeable to seeing urology and states that she used to have a urologist but has not seen them in years. Pt asks for a stronger medication to avoid urinating on herself.

## 2021-05-06 NOTE — Telephone Encounter (Signed)
Noted. Sending to PCP to inform her that [facility] wants to find another doctor.

## 2021-05-06 NOTE — Telephone Encounter (Signed)
If this is occurring when she is standing up, etc - may be more of an issue with stress incontinence - weakening of bladder, etc.  The medication if more for overactive bladder.  I will place the order for urology.  Would need to be evaluated to confirm what medication would be needed - if change is needed.

## 2021-05-06 NOTE — Telephone Encounter (Signed)
Patient is returning your call,please advise.

## 2021-05-06 NOTE — Telephone Encounter (Signed)
Pt called and states that she is unhappy that she can not have another medication. She states that she is going to have to find another doctor and that Dr Brandon would have prescribed her something different. I reiterated that she was placing a referral to urology and she said that she will be out of town for about a month. She said that no one wants to help her at our office. She ended the call.

## 2021-05-06 NOTE — Telephone Encounter (Signed)
Left a message to call back.

## 2021-05-09 ENCOUNTER — Telehealth: Payer: Self-pay | Admitting: Internal Medicine

## 2021-05-09 NOTE — Telephone Encounter (Signed)
Patient is calling in to check on the status of the message below.Patient stated she is currently have urinary frequency and wants to see a urologist from before,patient transferred to Access Nurse for frequency.

## 2021-05-09 NOTE — Telephone Encounter (Signed)
Please see Telephone note from 05/05/21. Pt was upset that the doc of the day would not change the patients medication without an evaluation. Pt states that she would find another doctor to help her with her symptoms. Pt was referred to Dr Nicki Reaper MacDiarmid in 2019 and was advised to consider UROGYN- Dr. Sherlene Shams by Dr Olivia Mackie on 01/05/21. Called patient to inform her of the name, Call immediately went to voicemail. Left a message to call back.

## 2021-05-09 NOTE — Telephone Encounter (Signed)
Please see Telephone note from 05/05/21. Pt was upset that The covering doctors would not change the patients medication without an evaluation. Pt states that she would find another doctor to help her with her symptoms. Pt was referred to Dr Nicki Reaper MacDiarmid in 2019 and was advised to consider UROGYN- Dr. Sherlene Shams by Dr Olivia Mackie on 01/05/21. Called patient to inform her of the name, Call immediately went to voicemail. Left a message to call back.

## 2021-05-09 NOTE — Telephone Encounter (Signed)
Providing access nurse documentation.      

## 2021-05-09 NOTE — Telephone Encounter (Signed)
Patient informed, Due to the high volume of calls and your symptoms we have to forward your call to our Triage Nurse to expedient your call. Please hold for the transfer.  Patient transferred to Santiago Glad at [facility]. Due to having urinary frequency for some time and wanting to see a urologist.No openings in office or virtual.

## 2021-05-09 NOTE — Telephone Encounter (Signed)
Patient is calling in to get the name of the urologist that she was referred to by Dr.Tracy a couple of years ago and she has seen this provider in the past. She stated she wants the number as soon as possible because it is hard to get an appointment with them,please advise.

## 2021-05-10 ENCOUNTER — Other Ambulatory Visit: Payer: Self-pay

## 2021-05-10 ENCOUNTER — Ambulatory Visit (INDEPENDENT_AMBULATORY_CARE_PROVIDER_SITE_OTHER): Payer: Medicare Other | Admitting: Physician Assistant

## 2021-05-10 VITALS — BP 134/75 | HR 71 | Ht 66.0 in | Wt 188.0 lb

## 2021-05-10 DIAGNOSIS — N3281 Overactive bladder: Secondary | ICD-10-CM | POA: Diagnosis not present

## 2021-05-10 LAB — BLADDER SCAN AMB NON-IMAGING

## 2021-05-10 MED ORDER — GEMTESA 75 MG PO TABS: 75.0000 mg | ORAL_TABLET | Freq: Every day | ORAL | 0 refills | Status: DC

## 2021-05-10 NOTE — Telephone Encounter (Signed)
Left a message to call back.

## 2021-05-10 NOTE — Progress Notes (Signed)
05/10/2021 12:00 PM   Melissa Anderson 19-Oct-1940 [id number]  CC: Chief Complaint  Patient presents with   Medication Management    HPI: Melissa Anderson is a 80 y.o. female with PMH ovarian cancer s/p vaginal hysterectomy with salpingo-oophorectomy and OAB wet who presents today for evaluation of worsening urinary symptoms.  She was seen in our clinic most recently by Dr. Matilde Sprang 3 years ago for evaluation of OAB wet, at which point she was on Vesicare 5 mg.  She was subsequently lost to follow-up.  Today she reports worsened urinary urgency and nocturia x2-3 over the past 4 months.  She is no longer on Vesicare and is now taking oxybutynin XL 15 mg daily, prescribed by urologist in [place].  She is incredibly bothered by her urinary symptoms, which are notable for large volume leakage especially overnight associated with foot on the floor phenomenon.  She wears pads and supplements these with towels and is incredibly self-conscious about this.  Notably, she is planning to travel back to [place] next week for 3 to 4 weeks and does not want to have to use these products for urinary control while she is staying at a friend's home.  She denies dysuria, gross hematuria, and flank pain.  She does have BLE edema and is seeing a vascular surgeon for this.  She has been using SCDs at home for 1 hour twice daily around 6 AM and 6 PM.  She tends to go to bed between 9 and 11 PM.  She is not taking diuretics.  She drinks fluid after bedtime.  Per chart review, she has taken Myrbetriq, however patient cannot recall having taken this medication or if it was helpful for her.  In-office UA today positive for 2+ leukocyte esterase; urine microscopy with 11-30 WBCs/HPF and moderate bacteria. PVR 66m.  PMH: Past Medical History:  Diagnosis Date   Aortic atherosclerosis ([facility]    Arthritis    right shoulder   Cancer ([facility]   b/l ovaries and left fallopian tube pelvic lymph nodes  negative    Guillain Barr syndrome ([place]    2/2 flu shot 05/2018,  intubated x 8 days   Hyperlipidemia    Overactive bladder    Thyroid disease    UTI (urinary tract infection)    Vitamin D deficiency     Surgical History: Past Surgical History:  Procedure Laterality Date   ABDOMINAL HYSTERECTOMY     02/15/18    CHOLECYSTECTOMY  2008   LAPAROSCOPIC VAGINAL HYSTERECTOMY WITH SALPINGO OOPHORECTOMY  02/15/2018   PORTACATH PLACEMENT Left 02/25/2018   Procedure: INSERTION PORT-A-CATH;  Surgeon: BRobert Bellow MD;  Location: [facility] ORS;  Service: General;  Laterality: Left;   TOTAL ABDOMINAL HYSTERECTOMY W/ BILATERAL SALPINGOOPHORECTOMY  02/15/2018   lymph nodes removed    Home Medications:  Allergies as of 05/10/2021       Reactions   Crestor [rosuvastatin]    Myopathy    Epinephrine Other (See Comments)   Funny feeling, heart racing   Influenza Vaccines    ?GBS 05/2018   Lipitor [atorvastatin Calcium]    Myalgias         Medication List        Accurate as of May 10, 2021 12:00 PM. If you have any questions, ask your nurse or doctor.          amoxicillin 500 MG capsule Commonly known as: AMOXIL Take 500 mg by mouth 3 (three) times daily.  aspirin EC 81 MG tablet Take 81 mg by mouth daily.   diclofenac 75 MG EC tablet Commonly known as: VOLTAREN Take 75 mg by mouth 2 (two) times daily.   finasteride 5 MG tablet Commonly known as: PROSCAR Take 5 mg by mouth daily.   oxybutynin 15 MG 24 hr tablet Commonly known as: DITROPAN XL Take 1 tablet (15 mg total) by mouth at bedtime.   Synthroid 175 MCG tablet Generic drug: levothyroxine Take 1 tablet (175 mcg total) by mouth daily.   Vitamin D (Ergocalciferol) 1.25 MG (50000 UNIT) Caps capsule Commonly known as: DRISDOL Take 50,000 Units by mouth once a week.   VITAMIN D3 PO Take 1,000 mg by mouth.        Allergies:  Allergies  Allergen Reactions   Crestor [Rosuvastatin]     Myopathy      Epinephrine Other (See Comments)    Funny feeling, heart racing    Influenza Vaccines     ?GBS 05/2018    Lipitor [Atorvastatin Calcium]     Myalgias      Family History: Family History  Problem Relation Age of Onset   Sudden death Mother 12       drowning in hurricane Katrina   Pancreatic cancer Father        deceased early 30s; smoker   Alcohol abuse Father    COPD Father    Coronary artery disease Brother    Melanoma Sister        deceased at 38   COPD Sister    Prostate cancer Maternal Uncle        deceased 68   Breast cancer Paternal Aunt        unsure of this dx    Social History:   reports that she quit smoking about 37 years ago. Her smoking use included cigarettes. She has a 25.00 pack-year smoking history. She has never used smokeless tobacco. She reports that she does not drink alcohol and does not use drugs.  Physical Exam: BP 134/75   Pulse 71   Ht '5\' 6"'$  (1.676 m)   Wt 188 lb (85.3 kg)   BMI 30.34 kg/m   Constitutional:  Alert and oriented, no acute distress, nontoxic appearing HEENT: [place], AT Cardiovascular: No clubbing, cyanosis, or edema Respiratory: Normal respiratory effort, no increased work of breathing Skin: No rashes, bruises or suspicious lesions Neurologic: Grossly intact, no focal deficits, moving all 4 extremities Psychiatric: Normal mood and affect  Laboratory Data: Results for orders placed or performed in visit on 05/10/21  Microscopic Examination   Urine  Result Value Ref Range   WBC, UA 11-30 (A) 0 - 5 /hpf   RBC 0-2 0 - 2 /hpf   Epithelial Cells (non renal) 0-10 0 - 10 /hpf   Bacteria, UA Moderate (A) None seen/Few  Urinalysis, Complete  Result Value Ref Range   Specific Gravity, UA 1.020 1.005 - 1.030   pH, UA 5.0 5.0 - 7.5   Color, UA Yellow Yellow   Appearance Ur Hazy (A) Clear   Leukocytes,UA 2+ (A) Negative   Protein,UA Negative Negative/Trace   Glucose, UA Negative Negative   Ketones, UA Negative Negative   RBC, UA  Negative Negative   Bilirubin, UA Negative Negative   Urobilinogen, Ur 0.2 0.2 - 1.0 mg/dL   Nitrite, UA Negative Negative   Microscopic Examination See below:   Bladder Scan (Post Void Residual) in office  Result Value Ref Range   Scan Result 50m  Assessment & Plan:   1. Overactive bladder Worsened frequency and nocturia with large volume leakage refractory to anticholinergics.  UA today is notable for pyuria and bacteriuria, though she does not appear clinically infected.  Will send for culture for further evaluation and treat as indicated, though I suspect she may grew mixed urogenital flora.  We discussed that any pharmacotherapy I offer her will take time to improve her urinary symptoms and that I will likely not be able to resolve her leakage within the next week.  We discussed various treatment options today including an alternative anticholinergic versus beta 3 agonist.  I encouraged a trial of Myrbetriq to see if this medication may actually be beneficial for her, though patient was particularly eager to try Gemtesa.  We discussed that as a newer medication on the market, [place] can sometimes be a challenge in terms of insurance coverage and cost.  Despite this, she wishes to proceed.  Gemtesa samples provided today.  We will plan for symptom recheck and PVR when she returns to town.  We also discussed alternative therapeutic options today including PTNS, intravesical Botox, and InterStim.  May consider these in the future if she fails additional pharmacotherapy. - Urinalysis, Complete - Bladder Scan (Post Void Residual) in office - CULTURE, URINE COMPREHENSIVE - Vibegron (GEMTESA) 75 MG TABS; Take 75 mg by mouth daily.  Dispense: 35 tablet; Refill: 0   Return in about 4 weeks (around 06/07/2021) for Symptom recheck with PVR.  I spent 35 minutes on the day of the encounter to include pre-visit record review, face-to-face time with the patient, and post-visit ordering of tests.    Debroah Loop, PA-C  [facility] [address], [place], [place] [address] [phone]

## 2021-05-10 NOTE — Patient Instructions (Signed)
Stop oxybutynin. Start [place]. Continue your leg squeezer device. Reduce fluid intake after dinnertime.  I'll see you back after you return to town.

## 2021-05-11 ENCOUNTER — Inpatient Hospital Stay: Payer: Medicare Other | Attending: Oncology

## 2021-05-11 ENCOUNTER — Inpatient Hospital Stay: Payer: Medicare Other

## 2021-05-11 LAB — MICROSCOPIC EXAMINATION

## 2021-05-11 LAB — URINALYSIS, COMPLETE
Bilirubin, UA: NEGATIVE
Glucose, UA: NEGATIVE
Ketones, UA: NEGATIVE
Nitrite, UA: NEGATIVE
Protein,UA: NEGATIVE
RBC, UA: NEGATIVE
Specific Gravity, UA: 1.02 (ref 1.005–1.030)
Urobilinogen, Ur: 0.2 mg/dL (ref 0.2–1.0)
pH, UA: 5 (ref 5.0–7.5)

## 2021-05-11 NOTE — Telephone Encounter (Signed)
Called to speak with [facility]. Melissa Anderson states that she has already been contacted by our referral team and already has her old Doctors name and has an appointment scheduled. Melissa Anderson thanked me for my time and the call was disconnected.

## 2021-05-16 LAB — CULTURE, URINE COMPREHENSIVE

## 2021-05-18 ENCOUNTER — Telehealth: Payer: Self-pay

## 2021-05-18 NOTE — Telephone Encounter (Signed)
LMOM for patient to return call. Attempt 1.

## 2021-05-18 NOTE — Telephone Encounter (Signed)
-----   Message from Debroah Loop, [place] sent at 05/17/2021  4:50 PM EDT ----- Please call her and send in Bactrim DS BID x5 days if she's having infection symptoms: dysuria, worsened urgency/frequency over baseline, hematuria, lower abdominal pain, low back pain, fever, chills, nausea, or vomiting. ----- Message ----- From: Interface, [facility] Lab Results In Sent: 05/11/2021   1:36 PM EDT To: Debroah Loop, PA-C

## 2021-05-26 ENCOUNTER — Telehealth: Payer: Self-pay

## 2021-05-26 NOTE — Telephone Encounter (Signed)
Letter sent for missed 05/11/21 appointment with Dr. Theora Gianotti.

## 2021-06-09 ENCOUNTER — Other Ambulatory Visit: Payer: Self-pay

## 2021-06-09 ENCOUNTER — Ambulatory Visit (INDEPENDENT_AMBULATORY_CARE_PROVIDER_SITE_OTHER): Payer: Medicare Other | Admitting: Physician Assistant

## 2021-06-09 ENCOUNTER — Encounter: Payer: Self-pay | Admitting: Physician Assistant

## 2021-06-09 VITALS — BP 115/74 | HR 90 | Ht 66.0 in | Wt 189.0 lb

## 2021-06-09 DIAGNOSIS — R8271 Bacteriuria: Secondary | ICD-10-CM | POA: Diagnosis not present

## 2021-06-09 DIAGNOSIS — N3281 Overactive bladder: Secondary | ICD-10-CM | POA: Diagnosis not present

## 2021-06-09 LAB — BLADDER SCAN AMB NON-IMAGING

## 2021-06-09 MED ORDER — MIRABEGRON ER 50 MG PO TB24: 50.0000 mg | ORAL_TABLET | Freq: Every day | ORAL | 0 refills | Status: DC

## 2021-06-09 NOTE — Progress Notes (Signed)
06/09/2021 4:06 PM   Melissa Anderson Jun 05, 1941 [id number]  CC: Chief Complaint  Patient presents with   Over Active Bladder   HPI: Melissa Anderson is a 80 y.o. female with PMH ovarian cancer s/p vaginal hysterectomy with salpingo-oophorectomy and OAB wet who previously failed Vesicare and oxybutynin who presents today for symptom recheck on Gemtesa.   Today she reports no symptomatic improvement on Gemtesa.  She continues to report bothersome nocturia x2-3, urgency, and large volume leakage.  She states she was recently gifted an espresso machine and had nocturia every 2 hours last night after using it in the evening.  Notably, she denies bulging or pressure in the vaginal vault.  Notably, urine culture from her last clinic visit grew pansensitive E. coli.  Patient continues to deny acute infective symptoms today.  She states her PCP treated her earlier this year for UTI, at which point she was also asymptomatic.  She states she has had urinary tract infections in the past and is familiar with symptoms of these, but she is adamant that she did not have those symptoms at the time of her previous treatment or at this time.  PVR 0 mL  PMH: Past Medical History:  Diagnosis Date   Aortic atherosclerosis ([place])    Arthritis    right shoulder   Cancer ([place]) 2019   b/l ovaries and left fallopian tube pelvic lymph nodes negative    Guillain Barr syndrome ([place])    2/2 flu shot 05/2018,  intubated x 8 days   Hyperlipidemia    Overactive bladder    Thyroid disease    UTI (urinary tract infection)    Vitamin D deficiency     Surgical History: Past Surgical History:  Procedure Laterality Date   ABDOMINAL HYSTERECTOMY     02/15/18    CHOLECYSTECTOMY  2008   LAPAROSCOPIC VAGINAL HYSTERECTOMY WITH SALPINGO OOPHORECTOMY  02/15/2018   PORTACATH PLACEMENT Left 02/25/2018   Procedure: INSERTION PORT-A-CATH;  Surgeon: Robert Bellow, MD;  Location: [facility] ORS;  Service: General;   Laterality: Left;   TOTAL ABDOMINAL HYSTERECTOMY W/ BILATERAL SALPINGOOPHORECTOMY  02/15/2018   lymph nodes removed    Home Medications:  Allergies as of 06/09/2021       Reactions   Crestor [rosuvastatin]    Myopathy    Epinephrine Other (See Comments)   Funny feeling, heart racing   Influenza Vaccines    ?GBS 05/2018   Lipitor [atorvastatin Calcium]    Myalgias         Medication List        Accurate as of June 09, 2021  4:06 PM. If you have any questions, ask your nurse or doctor.          STOP taking these medications    Gemtesa 75 MG Tabs Generic drug: Vibegron Stopped by: Debroah Loop, PA-C       TAKE these medications    amoxicillin 500 MG capsule Commonly known as: AMOXIL Take 500 mg by mouth 3 (three) times daily.   aspirin EC 81 MG tablet Take 81 mg by mouth daily.   diclofenac 75 MG EC tablet Commonly known as: VOLTAREN Take 75 mg by mouth 2 (two) times daily.   finasteride 5 MG tablet Commonly known as: PROSCAR Take 5 mg by mouth daily.   mirabegron ER 50 MG Tb24 tablet Commonly known as: MYRBETRIQ Take 1 tablet (50 mg total) by mouth daily. Started by: Debroah Loop, PA-C   Synthroid 175 MCG tablet  Generic drug: levothyroxine Take 1 tablet (175 mcg total) by mouth daily.   Vitamin D (Ergocalciferol) 1.25 MG (50000 UNIT) Caps capsule Commonly known as: DRISDOL Take 50,000 Units by mouth once a week.   VITAMIN D3 PO Take 1,000 mg by mouth.        Allergies:  Allergies  Allergen Reactions   Crestor [Rosuvastatin]     Myopathy     Epinephrine Other (See Comments)    Funny feeling, heart racing    Influenza Vaccines     ?GBS 05/2018    Lipitor [Atorvastatin Calcium]     Myalgias      Family History: Family History  Problem Relation Age of Onset   Sudden death Mother 66       drowning in hurricane Katrina   Pancreatic cancer Father        deceased early 21s; smoker   Alcohol abuse Father     COPD Father    Coronary artery disease Brother    Melanoma Sister        deceased at 71   COPD Sister    Prostate cancer Maternal Uncle        deceased 65   Breast cancer Paternal Aunt        unsure of this dx    Social History:   reports that she quit smoking about 37 years ago. Her smoking use included cigarettes. She has a 25.00 pack-year smoking history. She has never used smokeless tobacco. She reports that she does not drink alcohol and does not use drugs.  Physical Exam: BP 115/74   Pulse 90   Ht 5\' 6"  (1.676 m)   Wt 189 lb (85.7 kg)   BMI 30.51 kg/m   Constitutional:  Alert and oriented, no acute distress, nontoxic appearing HEENT: [place], AT Cardiovascular: No clubbing, cyanosis, or edema Respiratory: Normal respiratory effort, no increased work of breathing Skin: No rashes, bruises or suspicious lesions Neurologic: Grossly intact, no focal deficits, moving all 4 extremities Psychiatric: Normal mood and affect  Laboratory Data: Results for orders placed or performed in visit on 06/09/21  Bladder Scan (Post Void Residual) in office  Result Value Ref Range   Scan Result 6mL    Assessment & Plan:   1. Overactive bladder She has failed several anticholinergics and Gemtesa.  We will switch her to a trial of Myrbetriq 50 mg daily and have her follow-up with Dr. Matilde Sprang in a month.  Patient is in agreement with this plan.  We discussed bladder irritants today and I counseled her to decrease fluid intake after dinner and avoid caffeine in the afternoon/evening. - Bladder Scan (Post Void Residual) in office - mirabegron ER (MYRBETRIQ) 50 MG TB24 tablet; Take 1 tablet (50 mg total) by mouth daily.  Dispense: 28 tablet; Refill: 0  2. Asymptomatic bacteriuria We discussed that in the absence of acute infective symptoms, treatment is not indicated.  I do not believe this is contributing significantly to her OAB as above.  Patient is in agreement with this plan.  Return in  about 4 weeks (around 07/07/2021) for Symptom recheck with PVR with Dr. Matilde Sprang.  Debroah Loop, PA-C  [facility] [address], [place], [place] [address] [phone]

## 2021-06-09 NOTE — Patient Instructions (Signed)
Try to avoid fluids after dinnertime and caffeinated beverages after the morning. These are especially likely to worsen your nighttime symptoms.

## 2021-06-15 ENCOUNTER — Telehealth: Payer: Self-pay | Admitting: Internal Medicine

## 2021-06-15 NOTE — Telephone Encounter (Signed)
She needs to call [facility] to coordinate PT for her give the # please

## 2021-06-15 NOTE — Telephone Encounter (Signed)
Patient called in stating that she has an upcoming appointment with Dr.Tracy in November but she is not getting better and would like to know how she can start physical therapy.Please call the patient at [phone].

## 2021-06-16 NOTE — Telephone Encounter (Signed)
Called to speak with Melissa Anderson and gave the number to [facility] to ask about PT. Dietrich voiced understanding and had no further questions.

## 2021-07-04 ENCOUNTER — Encounter: Payer: Self-pay | Admitting: Urology

## 2021-07-04 ENCOUNTER — Ambulatory Visit (INDEPENDENT_AMBULATORY_CARE_PROVIDER_SITE_OTHER): Payer: Medicare Other | Admitting: Urology

## 2021-07-04 ENCOUNTER — Other Ambulatory Visit: Payer: Self-pay

## 2021-07-04 VITALS — BP 142/90 | HR 70 | Ht 66.0 in | Wt 182.0 lb

## 2021-07-04 DIAGNOSIS — N3281 Overactive bladder: Secondary | ICD-10-CM

## 2021-07-04 DIAGNOSIS — N3946 Mixed incontinence: Secondary | ICD-10-CM

## 2021-07-04 LAB — BLADDER SCAN AMB NON-IMAGING: SCA Result: 2

## 2021-07-04 MED ORDER — MIRABEGRON ER 50 MG PO TB24: 50.0000 mg | ORAL_TABLET | Freq: Every day | ORAL | 11 refills | Status: DC

## 2021-07-04 MED ORDER — GEMTESA 75 MG PO TABS: 75.0000 mg | ORAL_TABLET | Freq: Every day | ORAL | 11 refills | Status: DC

## 2021-07-04 NOTE — Progress Notes (Addendum)
07/04/2021 10:52 AM   Melissa Anderson 07/07/1941 [id number]  Referring provider: McLean-Scocuzza, Nino Glow, MD [place],  [place] [address]  Chief Complaint  Patient presents with   Urinary Incontinence    HPI: Consulted to assess the patient's urinary incontinence worsening over many months.  She is living here locally with her daughter who is an oncologist to have chemotherapy for stage II ovarian cancer.  She lives in [place].   She describes mild urge incontinence during the day and wears 1 pad.  She describes bedwetting and foot on the floor syndrome.  She is currently on Vesicare 5 mg as the only medication tried for her overactive bladder.  She denies stress incontinence.  She can use 3 pads at night for higher volume leakage    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  The role of combination therapy with Myrbetriq 50 mg with the Vesicare described.  She did well I would stop the Vesicare.  She thinks the Vesicare 5 mg may be helping some and we spoke about increased dosing.  Reassess in 5 weeks on double therapy and stop Vesicare if patient doing well  Today Frequency stable.  I have not seen the patient since 2019.  Patient recently saw a nurse practitioner.  She had failed Vesicare and oxybutynin and had a recent positive urinary tract infection.  It appears she failed Gemtesa.  She was given Myrbetriq.  She has had 1 positive and one negative culture in the last several months  Patient down to 1 pad a day that is damp.  She does quite well with urge incontinence during the day and no stress incontinence.  She might have some bedwetting but she definitely has foot on the floor syndrome in the middle the night.  The new beta 3 agonist Logan Bores is helping her in improving her quality life.  She has failed Vesicare and oxybutynin and it appears she has failed Myrbetriq.  She would like to stay on the medication.  Review percutaneous tibial  nerve stimulation in detail and gave her handout        PMH: Past Medical History:  Diagnosis Date   Aortic atherosclerosis ([place])    Arthritis    right shoulder   Cancer ([place]) 2019   b/l ovaries and left fallopian tube pelvic lymph nodes negative    Guillain Barr syndrome ([place])    2/2 flu shot 05/2018,  intubated x 8 days   Hyperlipidemia    Overactive bladder    Thyroid disease    UTI (urinary tract infection)    Vitamin D deficiency     Surgical History: Past Surgical History:  Procedure Laterality Date   ABDOMINAL HYSTERECTOMY     02/15/18    CHOLECYSTECTOMY  2008   LAPAROSCOPIC VAGINAL HYSTERECTOMY WITH SALPINGO OOPHORECTOMY  02/15/2018   PORTACATH PLACEMENT Left 02/25/2018   Procedure: INSERTION PORT-A-CATH;  Surgeon: Robert Bellow, MD;  Location: [facility] ORS;  Service: General;  Laterality: Left;   TOTAL ABDOMINAL HYSTERECTOMY W/ BILATERAL SALPINGOOPHORECTOMY  02/15/2018   lymph nodes removed    Home Medications:  Allergies as of 07/04/2021       Reactions   Crestor [rosuvastatin]    Myopathy    Epinephrine Other (See Comments)   Funny feeling, heart racing   Influenza Vaccines    ?GBS 05/2018   Lipitor [atorvastatin Calcium]    Myalgias         Medication List  Accurate as of July 04, 2021 10:52 AM. If you have any questions, ask your nurse or doctor.          STOP taking these medications    amoxicillin 500 MG capsule Commonly known as: AMOXIL Stopped by: Reece Packer, MD   diclofenac 75 MG EC tablet Commonly known as: VOLTAREN Stopped by: Reece Packer, MD       TAKE these medications    aspirin EC 81 MG tablet Take 81 mg by mouth daily.   finasteride 5 MG tablet Commonly known as: PROSCAR Take 5 mg by mouth daily.   mirabegron ER 50 MG Tb24 tablet Commonly known as: MYRBETRIQ Take 1 tablet (50 mg total) by mouth daily.   Synthroid 175 MCG tablet Generic drug: levothyroxine Take 1 tablet (175 mcg  total) by mouth daily.   Vitamin D (Ergocalciferol) 1.25 MG (50000 UNIT) Caps capsule Commonly known as: DRISDOL Take 50,000 Units by mouth once a week.   VITAMIN D3 PO Take 1,000 mg by mouth.        Allergies:  Allergies  Allergen Reactions   Crestor [Rosuvastatin]     Myopathy     Epinephrine Other (See Comments)    Funny feeling, heart racing    Influenza Vaccines     ?GBS 05/2018    Lipitor [Atorvastatin Calcium]     Myalgias      Family History: Family History  Problem Relation Age of Onset   Sudden death Mother 72       drowning in hurricane Katrina   Pancreatic cancer Father        deceased early 39s; smoker   Alcohol abuse Father    COPD Father    Coronary artery disease Brother    Melanoma Sister        deceased at 15   COPD Sister    Prostate cancer Maternal Uncle        deceased 30   Breast cancer Paternal Aunt        unsure of this dx    Social History:  reports that she quit smoking about 37 years ago. Her smoking use included cigarettes. She has a 25.00 pack-year smoking history. She has never used smokeless tobacco. She reports that she does not drink alcohol and does not use drugs.  ROS:                                        Physical Exam: There were no vitals taken for this visit.  Constitutional:  Alert and oriented, No acute distress. HEENT: [place] AT, moist mucus membranes.  Trachea midline, no masses.  Laboratory Data: Lab Results  Component Value Date   WBC 5.7 09/29/2020   HGB 13.8 09/29/2020   HCT 41.5 09/29/2020   MCV 93.7 09/29/2020   PLT 239.0 09/29/2020    Lab Results  Component Value Date   CREATININE 0.72 09/29/2020    No results found for: PSA  No results found for: TESTOSTERONE  Lab Results  Component Value Date   HGBA1C 5.3 05/09/2018    Urinalysis    Component Value Date/Time   COLORURINE YELLOW (A) 04/16/2018 0941   APPEARANCEUR Hazy (A) 05/10/2021 1201   LABSPEC 1.020  04/16/2018 0941   PHURINE 5.0 04/16/2018 0941   GLUCOSEU Negative 05/10/2021 1201   HGBUR SMALL (A) 04/16/2018 0941   BILIRUBINUR Negative 05/10/2021 1201  KETONESUR NEGATIVE 04/16/2018 0941   PROTEINUR Negative 05/10/2021 [place] 04/16/2018 0941   NITRITE Negative 05/10/2021 1201   NITRITE NEGATIVE 04/16/2018 0941   LEUKOCYTESUR 2+ (A) 05/10/2021 1201    Pertinent Imaging:   Assessment & Plan: Continue with Myrbetriq with improved quality life and moderate effectiveness refractory other medications.  Handout given.  She will call if she wishes to proceed.  She might wait to January because of her travel to [place] to see her daughter.  Reassess in 1 year  To clarify patient is on Myrbetriq and she will stay on this.  1. Overactive bladder  - BLADDER SCAN AMB NON-IMAGING   No follow-ups on file.  Reece Packer, MD  [facility] [address] [place], [place] [address] [phone]

## 2021-07-04 NOTE — Addendum Note (Signed)
Addended by: Alvera Novel on: 07/04/2021 11:18 AM   Modules accepted: Orders

## 2021-07-12 ENCOUNTER — Telehealth: Payer: Self-pay | Admitting: Internal Medicine

## 2021-07-12 ENCOUNTER — Ambulatory Visit: Payer: Medicare Other | Admitting: Internal Medicine

## 2021-07-12 NOTE — Telephone Encounter (Signed)
Patient came into office, she missed her 1pm appointment. Pt thought it was at 2pm. She stated that she was never scheduled for PT. She is scheduled with Dr Olivia Mackie on 07/15/2021.

## 2021-07-12 NOTE — Telephone Encounter (Signed)
Noted, will discuss PATIENT orders at her appointment

## 2021-07-12 NOTE — Telephone Encounter (Signed)
Patient no-showed today's appointment; appointment was for 07/12/21, provider notified for review of record. Letter sent for patient to call in and re-schedule.

## 2021-07-13 ENCOUNTER — Inpatient Hospital Stay (HOSPITAL_BASED_OUTPATIENT_CLINIC_OR_DEPARTMENT_OTHER): Payer: Medicare Other | Admitting: Nurse Practitioner

## 2021-07-13 ENCOUNTER — Other Ambulatory Visit: Payer: Self-pay

## 2021-07-13 ENCOUNTER — Inpatient Hospital Stay: Payer: Medicare Other | Attending: Oncology

## 2021-07-13 VITALS — BP 134/71 | HR 79 | Temp 96.5°F | Resp 20 | Wt 186.3 lb

## 2021-07-13 DIAGNOSIS — Z8543 Personal history of malignant neoplasm of ovary: Secondary | ICD-10-CM

## 2021-07-13 DIAGNOSIS — C561 Malignant neoplasm of right ovary: Secondary | ICD-10-CM

## 2021-07-13 DIAGNOSIS — M25551 Pain in right hip: Secondary | ICD-10-CM | POA: Diagnosis not present

## 2021-07-13 DIAGNOSIS — Z08 Encounter for follow-up examination after completed treatment for malignant neoplasm: Secondary | ICD-10-CM

## 2021-07-13 NOTE — Progress Notes (Signed)
[facility]  Telephone:([phone] [phone]  Patient Care Team: McLean-Scocuzza, Nino Glow, MD as PCP - General (Internal Medicine) Gillis Ends, MD as Referring Physician (Obstetrics and Gynecology) Bary Castilla Forest Gleason, MD as Surgeon (General Surgery) Sindy Guadeloupe, MD as Consulting Physician (Oncology) Clent Jacks, RN as Oncology Nurse Navigator   Name of the patient: Melissa Anderson  [id number]  09-19-1940   Date of visit: 07/13/2021  Gynecologic Oncology Interval Visit   Referring Provider: Dr Mike Gip  Chief Complaint: stage II ovarian cancer  Subjective:  Melissa Anderson is a 80 y.o. 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 followed by 4 cycles of carbo-taxol chemotherapy 03/22/18-06/03/2018, discontinued due to GBS from flu shot, NED since, who returns to clinic for follow up.   CA 125 (not elevated at time of diagnosis) 07/13/2021 18.2 01/05/21 16.4 09/08/2020 16.8 06/02/20 18.6 02/11/20 16 10/09/2018 16.9 02/05/2018 18 (diagnosis)   Last imaging was in July. She continues to feel well today except for hip pain. She is interested in a possible hip replacement.        Gynecologic Oncology History:  She was at her PCP office in March and was complaining of intermittent weakness and pain shooting down her right leg. Her PCP ordered an MRI of her lumbosacral spine which demonstrated this right adnexal mass as an incidental finding.   Radiologic Imaging: 11/12/2017 Lumbosacral spine MRI scan noted a multicystic 4 x 5.5 cm right adnexal mass with fluid blood levels. Prominent uterine endometrial stripe.   12/20/2017 Pelvic Ultrasound: Uterus measures 9.7 x 4.1 x 5.9cm and is retroverted. Endometrial stripe measures 9 mm. Within it, as outlines by a small amount of fluid, there is  a 1.4 x 0.7 x 1.4 cm polypoid echogenic structure with internal blood flow and small cystic components. A similar appearing but smaller 5 x 4 x 3 mm echogenic nodular density is noted within the endometrial cavity as well. The uterus is otherwise normal in appearance. There is a multiloculated complex cystic structure in the right adnexa measuring 6.3 x 6.1 x 4.7 cm. This has solid appearing components as well as anechoic portions, separated by thick septations. On MRI, many of these loculations demonstrate fluid-filled levels. No normal ovarian parenchyma is identified. The left ovary was not seen. There is no pelvic free fluid.  She subsequently underwent an EMB with her gynecologist and this was negative.   She apparently was not aware that she had a cyst this size until she was seen by her provider who recommended Gyn Oncology consultation for further evaluation. She was seen by Dr. Carlton Adam who recommended surgery and further imaging and tumor markers.   02/05/2018: CA 125 = 18, CA 19-9 = 8, CEA <0.5, OVA1 = 4 (low risk)  02/09/2018 CT Scan A/P: multi-septated complex cystic lesion noted in the right pelvis.adnexa immediately superior to the uterus which measures approximately 6.9 x 5.3 am in greatest axial dimensions and approximately 5.4 cm in greatest cranial caudal dimensions.  Abnormal thickening of the endometrial cavity which measures 1.3 cm in greatest width.  Multiple peripelvic cysts are identified in bilateral renal hilar regions measuring up to 3.6 cm on the right and 2.9 cm on the left.  On 02/15/18, she underwent TLH, BSO, with bilateral pelvic LN dissection, peritoneal biopsies, and omental biopsy with Dr. Unknown Foley at [facility]. No spread beyond tube and  ovaries.  She was discharged on POD1 w/o complication.    Pathology A. 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  See the synoptic report. Note: The tumor is positive for PAX8, WT1, and p53 (strong, uniform). Based on the presence of tubal intraepithelial carcinoma, this is considered a tubal primary.  B.  Endometrium, curettage: Fragments of endometrial polyp.  C. Uterus, total hysterectomy:   Uterus:    Endometrium: Endometrial polyp with focal glandular crowding, see note.    Myometrium: Adenomyosis, leiomyomata (up to 1 cm).    Cervix: No pathologic diagnosis.     Serosa: No pathologic diagnosis.  Note: The areas of endometrial glandular crowding are not cytologically distinct from the background and therefore do not meet the criteria for endometrial intraepithelial neoplasia (EIN).  D. Right pelvic lymph nodes, lymphadenectomy: Three lymph nodes, negative for malignancy (0/3).   E. Left pelvic lymph nodes, lymphadenectomy: Three lymph nodes, negative for malignancy (0/3).   F. Omentum, omentectomy: Benign omentum.  G. Peritoneal biopsy #1: Benign fibromuscular tissue.  H. Peritoneal biopsy #2: Benign fibrous tissue.  I. Peritoneal biopsy #3: Benign fibroadipose tissue.  J. Peritoneal biopsy #4: Benign fibroadipose tissue.  A. Pelvic Washings: - Negative. No Evidence of Malignancy  03/21/18. She was referred to Dr. Janese Banks, medical-oncology at [facility] and initiated cycle 1 of adjuvant carbo-Taxol on 03/21/18.   05/28/2018 Patient received cycle 4 of chemotherapy on . After 4 cycles of chemotherapy patient developed progressive weakness in her bilateral lower extremities and was admitted to the hospital in [place] where she developed worsening respiratory failure and had to be intubated was airlifted to [place].  Patient was eventually extubated.  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  She was given IVIG as well.  Of note she has received flu shot consistently in the past and has never had these complications ever. Patient was then sent to a neuro rehab unit and was eventually discharged home.   08/16/2018 - CT C/A/P IMPRESSION: 1. Status post hysterectomy and bilateral salpingo oophorectomy. No findings for metastatic ovarian cancer. No peritoneal surface or omental disease or abdominal/pelvic lymphadenopathy. 2. Mild chronic scarring changes in the lungs but no findings worrisome for pulmonary metastatic disease.  She has had her covid vaccines without complication.   Imaging was 06/01/20 which was negative for metastatic disease.   She has a h/o prior breast mass and she requested we order her mammogram done on 10/19/2020  IMPRESSION: No evidence of malignancy in the bilateral breasts. RECOMMENDATION: Screening mammogram in one year.(Code:[id number])  BI-RADS CATEGORY  1: Negative.    Genetic Testing:  - Invitae 83 gene Multi-Cancer panel was negative for pathogenic mutation in any genes. No VUS detected.   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   Chemotherapy-induced neuropathy ([place]) 05/14/2018   Vitamin D deficiency 05/08/2018   Neuropathy  05/08/2018   Overactive bladder 05/08/2018   Bilateral leg edema 05/08/2018   HLD (hyperlipidemia) 05/08/2018   Goals of care, counseling/discussion 02/25/2018   Ovarian cancer ([place]) 02/22/2018   Graves' disease 02/13/2018    Past Medical History: Past Medical History:  Diagnosis Date   Aortic  atherosclerosis ([place])    Arthritis    right shoulder   Cancer ([place]) 2019   b/l ovaries and left fallopian tube pelvic lymph nodes negative    Guillain Barr syndrome ([place])    2/2 flu shot 05/2018,  intubated x 8 days   Hyperlipidemia    Overactive bladder    Thyroid disease    UTI (urinary tract infection)    Vitamin D deficiency     Past Surgical History: Past Surgical History:  Procedure Laterality Date   ABDOMINAL HYSTERECTOMY     02/15/18    CHOLECYSTECTOMY  2008   LAPAROSCOPIC VAGINAL HYSTERECTOMY WITH SALPINGO OOPHORECTOMY  02/15/2018   PORTACATH PLACEMENT Left 02/25/2018   Procedure: INSERTION PORT-A-CATH;  Surgeon: Robert Bellow, MD;  Location: [facility] ORS;  Service: General;  Laterality: Left;   TOTAL ABDOMINAL HYSTERECTOMY W/ BILATERAL SALPINGOOPHORECTOMY  02/15/2018   lymph nodes removed    OB History:  OB History  Gravida Para Term Preterm AB Living  2         2  SAB IAB Ectopic Multiple Live Births               # Outcome Date GA Lbr Len/2nd Weight Sex Delivery Anes PTL Lv  2 Gravida           1 Gravida             Obstetric Comments  Menstrual age: 60    Age 1st Pregnancy: 3    Family History: Family History  Problem Relation Age of Onset   Sudden death Mother 65       drowning in hurricane Katrina   Pancreatic cancer Father        deceased early 96s; smoker   Alcohol abuse Father    COPD Father    Coronary artery disease Brother    Melanoma Sister        deceased at 63   COPD Sister    Prostate cancer Maternal Uncle        deceased 23   Breast cancer Paternal Aunt        unsure of this dx    Social History: Social History   Socioeconomic History   Marital status: Widowed    Spouse name: Not on file   Number of children: Not on file   Years of education: Not on file   Highest education level: Not on file  Occupational History   Not on file  Tobacco Use   Smoking status: Former    Packs/day: 1.00    Years: 25.00    Pack years:  25.00    Types: Cigarettes    Quit date: 02/26/1984    Years since quitting: 37.4   Smokeless tobacco: Never   Tobacco comments:    age 35 to 64 1 ppd   Vaping Use   Vaping Use: Never used  Substance and Sexual Activity   Alcohol use: Never   Drug use: Never   Sexual activity: Not Currently  Other Topics Concern   Not on file  Social History Narrative   Engineer, maintenance (IT), bachelors degree    Lives in [place] here visiting daughter [name]    Former smoker  quit age 63 y.o    Social Determinants of Radio broadcast assistant Strain: Low Risk    Difficulty of Paying Living Expenses: Not hard at all  Food Insecurity: No Food Insecurity   Worried About Charity fundraiser in the Last Year: Never true   Arboriculturist in the Last Year: Never true  Transportation Needs: No Transportation Needs   Lack of Transportation (Medical): No   Lack of Transportation (Non-Medical): No  Physical Activity: Not on file  Stress: No Stress Concern Present   Feeling of Stress : Not at all  Social Connections: Not on file  Intimate Partner Violence: Not At Risk   Fear of Current or Ex-Partner: No   Emotionally Abused: No   Physically Abused: No   Sexually Abused: No   Immunization History  Administered Date(s) Administered   Influenza, High Dose Seasonal PF 05/31/2018   Influenza-Unspecified 05/13/2018   PFIZER Comirnaty(Gray Top)Covid-19 Tri-Sucrose Vaccine 11/15/2020   PFIZER(Purple Top)SARS-COV-2 Vaccination 02/09/2020, 03/02/2020   Allergies: Allergies  Allergen Reactions   Crestor [Rosuvastatin]     Myopathy     Epinephrine Other (See Comments)    Funny feeling, heart racing    Influenza Vaccines     ?GBS 05/2018    Lipitor [Atorvastatin Calcium]     Myalgias      Review of Systems General:  no complaints Skin: no complaints Eyes: no complaints HEENT: no complaints Breasts: no complaints Pulmonary: no complaints Cardiac: no complaints Gastrointestinal: no  complaints Genitourinary/Sexual: no complaints Ob/Gyn: no complaints Musculoskeletal: hip pain Hematology: no complaints Neurologic/Psych: no complaints    Objective:  Physical Examination: BP 134/71   Pulse 79   Temp (!) 96.5 F (35.8 C)   Resp 20   Wt 186 lb 4.8 oz (84.5 kg)   SpO2 100%   BMI 30.07 kg/m    ECOG Performance Status: 1 - Symptomatic but completely ambulatory  GENERAL: Patient is a well appearing female in no acute distress. Limping. Appears in pain. HEENT:  Sclera clear. Anicteric NODES:  Negative axillary, supraclavicular, inguinal lymph node survery LUNGS:  Clear to auscultation bilaterally.   HEART:  Regular rate and rhythm.  ABDOMEN:  Soft, nontender.  No hernias, incisions well healed. No masses or ascites EXTREMITIES:  No peripheral edema. Atraumatic. No cyanosis SKIN:  Clear with no obvious rashes or skin changes.  NEURO:  Nonfocal. Well oriented.  Appropriate affect.  Pelvic: Exam chaperoned by CMA.  EGBUS: no lesions, discharge. Vagina: no lesions, discharge, or bleeding. Vaginal cuff is well healed. Uterus, Cervix, Ovaries: surgically absent. BME: smooth, no nodularity palpated. Rectal: confirmatory.      Assessment:  Melissa Anderson is a 80 y.o. 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 Unknown Foley. She has initiated chemotherapy 03/21/2018 with carbo-Taxol and is treated by Dr. Janese Banks at [facility]. Chemotherapy discontinued after 05/28/2018 due to GBS from flu shot. Clinically NED. Radiographically NED in July 2022. CA 125, not elevated at diagnosis but monitored is pending at time of visit.   Peripheral neuropathy.   Symptomatic bilateral lymphedema, possibly secondary to surgery. Lymphedema clinic evaluated and recommended lymphedema therapy. She did not purchase the equipment for treatment  She has completed germline  genetic testing which was negative for mutation.   Hip Pain- secondary to severe degenerative changes  Medical co-morbidities complicating care: BMI of 29.41 Plan:  1. Malignant neoplasm of right ovary (HCC)  CA125 pending today but has resulted as normal around 18. Clinically she is asymptomatic. Consider imaging in 6-7 months or sooner if she is symptomatic.  Continue to plan for physical exam including pelvic exam every 4 months through June 2023 then every 6 months for until five years, then annually thereafter. Will check ca 125 every 2 months at her request.   Somatic testing was not recommended at this time because she does not have a BRCA mutation and is not a candidate for maintenance PARP inhibitor after completing primary chemotherapy.  Somatic testing for mutation and HRD can be considered should she have recurrence.   Lymphedema- has been seeing Dr. Delana Meyer for this using lymph pump, compression stockings. Unclear if she has seen PT and we can revisit at next appointment  Hip pain- follow up with ortho  Beckey Rutter, [place], AGNP-C [place] at [facility] [phone] (clinic)

## 2021-07-14 ENCOUNTER — Telehealth: Payer: Self-pay | Admitting: *Deleted

## 2021-07-14 LAB — CA 125: Cancer Antigen (CA) 125: 18.2 U/mL (ref 0.0–38.1)

## 2021-07-14 NOTE — Telephone Encounter (Signed)
RN called and spoke with patient, informed that Ca 125 was normal per Beckey Rutter, NP. Pt given name of orthopedic @ [facility],   Dr Glenetta Borg per pt request.

## 2021-07-15 ENCOUNTER — Encounter: Payer: Self-pay | Admitting: Internal Medicine

## 2021-07-15 ENCOUNTER — Other Ambulatory Visit: Payer: Self-pay

## 2021-07-15 ENCOUNTER — Ambulatory Visit (INDEPENDENT_AMBULATORY_CARE_PROVIDER_SITE_OTHER): Payer: Medicare Other | Admitting: Internal Medicine

## 2021-07-15 VITALS — BP 118/80 | HR 72 | Temp 97.0°F | Ht 66.0 in | Wt 187.6 lb

## 2021-07-15 DIAGNOSIS — E611 Iron deficiency: Secondary | ICD-10-CM | POA: Diagnosis not present

## 2021-07-15 DIAGNOSIS — M47816 Spondylosis without myelopathy or radiculopathy, lumbar region: Secondary | ICD-10-CM

## 2021-07-15 DIAGNOSIS — G8929 Other chronic pain: Secondary | ICD-10-CM

## 2021-07-15 DIAGNOSIS — N3000 Acute cystitis without hematuria: Secondary | ICD-10-CM | POA: Diagnosis not present

## 2021-07-15 DIAGNOSIS — G629 Polyneuropathy, unspecified: Secondary | ICD-10-CM | POA: Diagnosis not present

## 2021-07-15 DIAGNOSIS — M25552 Pain in left hip: Secondary | ICD-10-CM | POA: Diagnosis not present

## 2021-07-15 DIAGNOSIS — K9041 Non-celiac gluten sensitivity: Secondary | ICD-10-CM

## 2021-07-15 DIAGNOSIS — I700 Atherosclerosis of aorta: Secondary | ICD-10-CM

## 2021-07-15 DIAGNOSIS — M160 Bilateral primary osteoarthritis of hip: Secondary | ICD-10-CM

## 2021-07-15 DIAGNOSIS — R0602 Shortness of breath: Secondary | ICD-10-CM | POA: Diagnosis not present

## 2021-07-15 DIAGNOSIS — E0500 Thyrotoxicosis with diffuse goiter without thyrotoxic crisis or storm: Secondary | ICD-10-CM | POA: Diagnosis not present

## 2021-07-15 DIAGNOSIS — M25551 Pain in right hip: Secondary | ICD-10-CM | POA: Diagnosis not present

## 2021-07-15 DIAGNOSIS — E559 Vitamin D deficiency, unspecified: Secondary | ICD-10-CM | POA: Diagnosis not present

## 2021-07-15 DIAGNOSIS — C561 Malignant neoplasm of right ovary: Secondary | ICD-10-CM

## 2021-07-15 DIAGNOSIS — E785 Hyperlipidemia, unspecified: Secondary | ICD-10-CM | POA: Diagnosis not present

## 2021-07-15 DIAGNOSIS — E039 Hypothyroidism, unspecified: Secondary | ICD-10-CM | POA: Diagnosis not present

## 2021-07-15 DIAGNOSIS — R319 Hematuria, unspecified: Secondary | ICD-10-CM

## 2021-07-15 LAB — COMPREHENSIVE METABOLIC PANEL
ALT: 15 U/L (ref 0–35)
AST: 16 U/L (ref 0–37)
Albumin: 4.3 g/dL (ref 3.5–5.2)
Alkaline Phosphatase: 70 U/L (ref 39–117)
BUN: 20 mg/dL (ref 6–23)
CO2: 31 mEq/L (ref 19–32)
Calcium: 9.4 mg/dL (ref 8.4–10.5)
Chloride: 106 mEq/L (ref 96–112)
Creatinine, Ser: 0.72 mg/dL (ref 0.40–1.20)
GFR: 78.58 mL/min (ref >60.00)
Glucose, Bld: 84 mg/dL (ref 70–99)
Potassium: 4.2 mEq/L (ref 3.5–5.1)
Sodium: 143 mEq/L (ref 135–145)
Total Bilirubin: 0.6 mg/dL (ref 0.2–1.2)
Total Protein: 6.5 g/dL (ref 6.0–8.3)

## 2021-07-15 LAB — CBC WITH DIFFERENTIAL/PLATELET
Basophils Absolute: 0.1 10*3/uL (ref 0.0–0.1)
Basophils Relative: 1.4 % (ref 0.0–3.0)
Eosinophils Absolute: 0.1 10*3/uL (ref 0.0–0.7)
Eosinophils Relative: 2.5 % (ref 0.0–5.0)
HCT: 42.2 % (ref 36.0–46.0)
Hemoglobin: 13.7 g/dL (ref 12.0–15.0)
Lymphocytes Relative: 28.3 % (ref 12.0–46.0)
Lymphs Abs: 1.3 10*3/uL (ref 0.7–4.0)
MCHC: 32.4 g/dL (ref 30.0–36.0)
MCV: 95.2 fl (ref 78.0–100.0)
Monocytes Absolute: 0.4 10*3/uL (ref 0.1–1.0)
Monocytes Relative: 8.9 % (ref 3.0–12.0)
Neutro Abs: 2.7 10*3/uL (ref 1.4–7.7)
Neutrophils Relative %: 58.9 % (ref 43.0–77.0)
Platelets: 251 10*3/uL (ref 150.0–400.0)
RBC: 4.43 Mil/uL (ref 3.87–5.11)
RDW: 13.4 % (ref 11.5–15.5)
WBC: 4.6 10*3/uL (ref 4.0–10.5)

## 2021-07-15 LAB — TSH: TSH: 0.94 u[IU]/mL (ref 0.35–5.50)

## 2021-07-15 LAB — LIPID PANEL
Cholesterol: 204 mg/dL — ABNORMAL HIGH (ref 0–200)
HDL: 61.1 mg/dL (ref >39.00)
LDL Cholesterol: 123 mg/dL — ABNORMAL HIGH (ref 0–99)
NonHDL: 142.57
Total CHOL/HDL Ratio: 3
Triglycerides: 99 mg/dL (ref 0.0–149.0)
VLDL: 19.8 mg/dL (ref 0.0–40.0)

## 2021-07-15 LAB — IBC + FERRITIN
Ferritin: 46.8 ng/mL (ref 10.0–291.0)
Iron: 95 ug/dL (ref 42–145)
Saturation Ratios: 27.4 % (ref 20.0–50.0)
TIBC: 347.2 ug/dL (ref 250.0–450.0)
Transferrin: 248 mg/dL (ref 212.0–360.0)

## 2021-07-15 LAB — VITAMIN D 25 HYDROXY (VIT D DEFICIENCY, FRACTURES): VITD: 66.49 ng/mL (ref 30.00–100.00)

## 2021-07-15 MED ORDER — TRAMADOL HCL 50 MG PO TABS: 50.0000 mg | ORAL_TABLET | Freq: Two times a day (BID) | ORAL | 0 refills | Status: DC | PRN

## 2021-07-15 NOTE — Progress Notes (Signed)
Chief Complaint  Patient presents with   Follow-up   F/u 1. C/o b/l hip pain R>L 5/10 est [facility] she wants to be checked gluten intolerance friend told her this could worsening joint pain  2. C/o sob with exertion will refer [facility] cardiology further w/u  3. Vit d def on weekly D3  4. GBS not sure if will get 4th covid shot  5. Hypothyroidism on synthyroid 175 mcg qd    Review of Systems  Constitutional:  Negative for weight loss.  HENT:  Negative for hearing loss.   Eyes:  Negative for blurred vision.  Respiratory:  Negative for shortness of breath.   Cardiovascular:  Negative for chest pain.  Gastrointestinal:  Negative for abdominal pain and blood in stool.  Genitourinary:  Negative for dysuria.  Musculoskeletal:  Positive for falls. Negative for joint pain.       X2 unsteady due to neuropathy  Skin:  Negative for rash.  Neurological:  Negative for headaches.  Psychiatric/Behavioral:  Negative for depression.   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    04/2021 [facility]   Cancer ([place]) 2019   b/l ovaries and left fallopian tube pelvic lymph nodes negative    Guillain Barr syndrome ([place])    2/2 flu shot 05/2018,  intubated x 8 days   Hyperlipidemia    Lymphedema    Overactive bladder    Thyroid disease    UTI (urinary tract infection)    Vitamin D deficiency    Past Surgical History:  Procedure Laterality Date   ABDOMINAL HYSTERECTOMY     02/15/18    CHOLECYSTECTOMY  2008   LAPAROSCOPIC VAGINAL HYSTERECTOMY WITH SALPINGO OOPHORECTOMY  02/15/2018   PORTACATH PLACEMENT Left 02/25/2018   Procedure: INSERTION PORT-A-CATH;  Surgeon: Robert Bellow, MD;  Location: [facility] ORS;  Service: General;  Laterality: Left;   TOTAL ABDOMINAL HYSTERECTOMY W/ BILATERAL SALPINGOOPHORECTOMY  02/15/2018   lymph nodes removed   Family History  Problem Relation Age of Onset   Sudden  death Mother 41       drowning in hurricane [place]rina   Pancreatic cancer Father        deceased early 85s; smoker   Alcohol abuse Father    COPD Father    Coronary artery disease Brother    Melanoma Sister        deceased at 57   COPD Sister    Prostate cancer Maternal Uncle        deceased 32   Breast cancer Paternal Aunt        unsure of this dx   Social History   Socioeconomic History   Marital status: Widowed    Spouse name: Not on file   Number of children: Not on file   Years of education: Not on file   Highest education level: Not on file  Occupational History   Not on file  Tobacco Use   Smoking status: Former    Packs/day: 1.00    Years: 25.00    Pack years: 25.00    Types: Cigarettes    Quit date: 02/26/1984    Years since quitting: 37.4   Smokeless tobacco: Never   Tobacco comments:    age 35 to 81 1 ppd   Vaping Use   Vaping Use: Never used  Substance and Sexual Activity   Alcohol use:  Never   Drug use: Never   Sexual activity: Not Currently  Other Topics Concern   Not on file  Social History Narrative   Engineer, maintenance (IT), bachelors degree    Lives in [place] here visiting daughter [name]    Former smoker quit age 9 y.o    Social Determinants of Health   Financial Resource Strain: Low Risk    Difficulty of Paying Living Expenses: Not hard at all  Food Insecurity: No Food Insecurity   Worried About Charity fundraiser in the Last Year: Never true   Arboriculturist in the Last Year: Never true  Transportation Needs: No Transportation Needs   Lack of Transportation (Medical): No   Lack of Transportation (Non-Medical): No  Physical Activity: Not on file  Stress: No Stress Concern Present   Feeling of Stress : Not at all  Social Connections: Not on file  Intimate Partner Violence: Not At Risk   Fear of Current or Ex-Partner: No   Emotionally Abused: No   Physically Abused: No   Sexually Abused: No   Current Meds  Medication Sig   aspirin EC 81 MG  tablet Take 81 mg by mouth daily.   Cholecalciferol (VITAMIN D3 PO) Take 1,000 mg by mouth.   Cholecalciferol 1.25 MG (50000 UT) TABS Take by mouth.   finasteride (PROSCAR) 5 MG tablet Take 5 mg by mouth daily.   mirabegron ER (MYRBETRIQ) 50 MG TB24 tablet Take 1 tablet (50 mg total) by mouth daily.   SYNTHROID 175 MCG tablet Take 1 tablet (175 mcg total) by mouth daily.   traMADol (ULTRAM) 50 MG tablet Take 1 tablet (50 mg total) by mouth 2 (two) times daily as needed.   Vitamin D, Ergocalciferol, (DRISDOL) 1.25 MG (50000 UNIT) CAPS capsule Take 50,000 Units by mouth once a week.   Allergies  Allergen Reactions   Crestor [Rosuvastatin]     Myopathy     Epinephrine Other (See Comments)    Funny feeling, heart racing    Influenza Vaccines     ?GBS 05/2018    Lipitor [Atorvastatin Calcium]     Myalgias     Recent Results (from the past 2160 hour(s))  Bladder Scan (Post Void Residual) in office     Status: None   Collection Time: 05/10/21 11:58 AM  Result Value Ref Range   Scan Result 65mL   Urinalysis, Complete     Status: Abnormal   Collection Time: 05/10/21 12:01 PM  Result Value Ref Range   Specific Gravity, UA 1.020 1.005 - 1.030   pH, UA 5.0 5.0 - 7.5   Color, UA Yellow Yellow   Appearance Ur Hazy (A) Clear   Leukocytes,UA 2+ (A) Negative   Protein,UA Negative Negative/Trace   Glucose, UA Negative Negative   Ketones, UA Negative Negative   RBC, UA Negative Negative   Bilirubin, UA Negative Negative   Urobilinogen, Ur 0.2 0.2 - 1.0 mg/dL   Nitrite, UA Negative Negative   Microscopic Examination See below:   Microscopic Examination     Status: Abnormal   Collection Time: 05/10/21 12:01 PM   Urine  Result Value Ref Range   WBC, UA 11-30 (A) 0 - 5 /hpf   RBC 0-2 0 - 2 /hpf   Epithelial Cells (non renal) 0-10 0 - 10 /hpf   Bacteria, UA Moderate (A) None seen/Few  CULTURE, URINE COMPREHENSIVE     Status: Abnormal   Collection Time: 05/10/21  1:00 PM   Specimen:  Urine   UR  Result Value Ref Range   Urine Culture, Comprehensive Final report (A)    Organism ID, Bacteria Escherichia coli (A)     Comment: Cefazolin <=4 ug/mL Cefazolin with an MIC <=16 predicts susceptibility to the oral agents cefaclor, cefdinir, cefpodoxime, cefprozil, cefuroxime, cephalexin, and loracarbef when used for therapy of uncomplicated urinary tract infections due to E. coli, Klebsiella pneumoniae, and Proteus mirabilis. Greater than 100,000 colony forming units per mL    ANTIMICROBIAL SUSCEPTIBILITY Comment     Comment:       ** S = Susceptible; I = Intermediate; R = Resistant **                    P = Positive; N = Negative             MICS are expressed in micrograms per mL    Antibiotic                 RSLT#1    RSLT#2    RSLT#3    RSLT#4 Amoxicillin/Clavulanic Acid    S Ampicillin                     S Cefepime                       S Ceftriaxone                    S Cefuroxime                     S Ciprofloxacin                  S Ertapenem                      S Gentamicin                     S Imipenem                       S Levofloxacin                   S Meropenem                      S Nitrofurantoin                 S Piperacillin/Tazobactam        S Tetracycline                   S Tobramycin                     S Trimethoprim/Sulfa             S   Bladder Scan (Post Void Residual) in office     Status: None   Collection Time: 06/09/21 10:00 AM  Result Value Ref Range   Scan Result 39mL   BLADDER SCAN AMB NON-IMAGING     Status: None   Collection Time: 07/04/21 10:54 AM  Result Value Ref Range   SCA Result 2   CA 125     Status: None   Collection Time: 07/13/21  8:41 AM  Result Value Ref Range   Cancer Antigen (CA) 125 18.2 0.0 - 38.1 U/mL    Comment: (NOTE) Roche Diagnostics Electrochemiluminescence Immunoassay (ECLIA) Values obtained  with different assay methods or kits cannot be used interchangeably.  Results cannot be interpreted as  absolute evidence of the presence or absence of malignant disease. Performed At: [facility] [place], [place] [id number] Rush Farmer MD YP:[id number]    Objective  Body mass index is 30.28 kg/m. Wt Readings from Last 3 Encounters:  07/15/21 187 lb 9.6 oz (85.1 kg)  07/13/21 186 lb 4.8 oz (84.5 kg)  07/04/21 182 lb (82.6 kg)   Temp Readings from Last 3 Encounters:  07/15/21 (!) 97 F (36.1 C) (Temporal)  07/13/21 (!) 96.5 F (35.8 C)  03/30/21 97.9 F (36.6 C) (Oral)   BP Readings from Last 3 Encounters:  07/15/21 118/80  07/13/21 134/71  07/04/21 (!) 142/90   Pulse Readings from Last 3 Encounters:  07/15/21 72  07/13/21 79  07/04/21 70    Physical Exam Vitals and nursing note reviewed.  Constitutional:      Appearance: Normal appearance. She is well-developed and well-groomed.  HENT:     Head: Normocephalic and atraumatic.  Eyes:     Conjunctiva/sclera: Conjunctivae normal.     Pupils: Pupils are equal, round, and reactive to light.  Cardiovascular:     Rate and Rhythm: Normal rate and regular rhythm.     Heart sounds: Normal heart sounds. No murmur heard. Pulmonary:     Effort: Pulmonary effort is normal.     Breath sounds: Normal breath sounds.  Abdominal:     General: Abdomen is flat. Bowel sounds are normal.     Tenderness: There is no abdominal tenderness.  Musculoskeletal:        General: No tenderness.  Skin:    General: Skin is warm and dry.  Neurological:     General: No focal deficit present.     Mental Status: She is alert and oriented to person, place, and time. Mental status is at baseline.     Cranial Nerves: Cranial nerves 2-12 are intact.     Gait: Gait is intact.  Psychiatric:        Attention and Perception: Attention and perception normal.        Mood and Affect: Mood and affect normal.        Speech: Speech normal.        Behavior: Behavior normal. Behavior is cooperative.        Thought Content: Thought  content normal.        Cognition and Memory: Cognition and memory normal.        Judgment: Judgment normal.    Assessment  Plan  Hip pain, bilateral, chronic- Plan: traMADol (ULTRAM) 50 MG tablet bid prn If needed long term will need pain contract, Ambulatory referral to Physical Therapy, Comprehensive metabolic panel, CBC with Differential/Platelet Est [facility] they rec surgery R>L  Arthritis, lumbar spine - Plan: Ambulatory referral to Physical Therapy   Graves' disease - Plan: TSH Hypothyroidism On synthyroid 175 mcg qd   Vitamin D deficiency - Plan: Vitamin D (25 hydroxy)  Neuropathy F/u neurology [facility]   Acute cystitis without hematuria - Plan: Urinalysis, Routine w reflex microscopic, Urine Culture Est urology  Gluten intolerance - Plan: Celiac Disease Antibody Screen  SOB (shortness of breath) on exertion - Plan: Ambulatory referral to Cardiology Aortic atherosclerosis ([place]) - Plan: Ambulatory referral to Cardiology   HM No further vaccines I.e FLU (had GBS after flu vaccine) due to GBS flu shot had 05/31/18. She had had flu shot prev and no sx's.  Tdap  per pt had 5 years ago get record pt to get record  Prevnar, pna 23, shingles vaccines  -pt to track down records.  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 Olivia Mackie McLean-[facility]ocuz[facility]

## 2021-07-15 NOTE — Patient Instructions (Addendum)
Cardiology [facility] Dr. Phineas Real [phone] Not available [address]   [facility]   [place] [address]   Dr. Waldo Laine lung doctors   Phone Fax E-mail Address  [phone] Not available [place]   [address] [address]           [facility] in [place] with [employer]    Lidocaine pain patch 4%  Tylenol  Aspercream with lidocaine  Tumeric/black pepper(curcumin)and ginger supplements Marica Otter or Petra Kuba Made brand)  [employer] stones in [place] due 10/19/2021   Consider coppertone compression stockings Hip Exercises Ask your health care provider which exercises are safe for you. Do exercises exactly as told by your health care provider and adjust them as directed. It is normal to feel mild stretching, pulling, tightness, or discomfort as you do these exercises. Stop right away if you feel sudden pain or your pain gets worse. Do not begin these exercises until told by your health care provider. Stretching and range-of-motion exercises These exercises warm up your muscles and joints and improve the movement and flexibility of your hip. These exercises also help to relieve pain, numbness, and tingling. You may be asked to limit your range of motion if you had a hip replacement. Talk to your health care provider about these restrictions. Hamstrings, supine  Lie on your back (supine position). Loop a belt or towel over the ball of your left / right foot. The ball of your foot is on the walking surface, right under your toes. Straighten your left / right knee and slowly pull on the belt or towel to raise your leg until you feel a gentle stretch behind your knee (hamstring). Do not let your knee bend while you do this. Keep your other leg flat on the floor. Hold this position for __________ seconds. Slowly return your leg to the starting  position. Repeat __________ times. Complete this exercise __________ times a day. Hip rotation  Lie on your back on a firm surface. With your left / right hand, gently pull your left / right knee toward the shoulder that is on the same side of the body. Stop when your knee is pointing toward the ceiling. Hold your left / right ankle with your other hand. Keeping your knee steady, gently pull your left / right ankle toward your other shoulder until you feel a stretch in your buttocks. Keep your hips and shoulders firmly planted while you do this stretch. Hold this position for __________ seconds. Repeat __________ times. Complete this exercise __________ times a day. Seated stretch This exercise is sometimes called hamstrings and adductors stretch. Sit on the floor with your legs stretched wide. Keep your knees straight during this exercise. Keeping your head and back in a straight line, bend at your waist to reach for your left foot (position A). You should feel a stretch in your right inner thigh (adductors). Hold this position for __________ seconds. Then slowly return to the upright position. Keeping your head and back in a straight line, bend at your waist to reach forward (position B). You should feel a stretch behind both of your thighs and knees (hamstrings). Hold this position for __________ seconds. Then slowly return to the upright position. Keeping your head and back in a straight line, bend at your waist to reach for your right foot (position C). You should feel a stretch in your left inner thigh (adductors). Hold  this position for __________ seconds. Then slowly return to the upright position. Repeat __________ times. Complete this exercise __________ times a day. Lunge This exercise stretches the muscles of the hip (hip flexors). Place your left / right knee on the floor and bend your other knee so that is directly over your ankle. You should be half-kneeling. Keep good posture  with your head over your shoulders. Tighten your buttocks to point your tailbone downward. This will prevent your back from arching too much. You should feel a gentle stretch in the front of your left / right thigh and hip. If you do not feel a stretch, slide your other foot forward slightly and then slowly lunge forward with your chest up until your knee once again lines up over your ankle. Make sure your tailbone continues to point downward. Hold this position for __________ seconds. Slowly return to the starting position. Repeat __________ times. Complete this exercise __________ times a day. Strengthening exercises These exercises build strength and endurance in your hip. Endurance is the ability to use your muscles for a long time, even after they get tired. Bridge This exercise strengthens the muscles of your hip (hip extensors). Lie on your back on a firm surface with your knees bent and your feet flat on the floor. Tighten your buttocks muscles and lift your bottom off the floor until the trunk of your body and your hips are level with your thighs. Do not arch your back. You should feel the muscles working in your buttocks and the back of your thighs. If you do not feel these muscles, slide your feet 1-2 inches (2.5-5 cm) farther away from your buttocks. Hold this position for __________ seconds. Slowly lower your hips to the starting position. Let your muscles relax completely between repetitions. Repeat __________ times. Complete this exercise __________ times a day. Straight leg raises, side-lying This exercise strengthens the muscles that move the hip joint away from the center of the body (hip abductors). Lie on your side with your left / right leg in the top position. Lie so your head, shoulder, hip, and knee line up. You may bend your bottom knee slightly to help you balance. Roll your hips slightly forward, so your hips are stacked directly over each other and your left / right  knee is facing forward. Leading with your heel, lift your top leg 4-6 inches (10-15 cm). You should feel the muscles in your top hip lifting. Do not let your foot drift forward. Do not let your knee roll toward the ceiling. Hold this position for __________ seconds. Slowly return to the starting position. Let your muscles relax completely between repetitions. Repeat __________ times. Complete this exercise __________ times a day. Straight leg raises, side-lying This exercise strengthens the muscles that move the hip joint toward the center of the body (hip adductors). Lie on your side with your left / right leg in the bottom position. Lie so your head, shoulder, hip, and knee line up. You may place your upper foot in front to help you balance. Roll your hips slightly forward, so your hips are stacked directly over each other and your left / right knee is facing forward. Tense the muscles in your inner thigh and lift your bottom leg 4-6 inches (10-15 cm). Hold this position for __________ seconds. Slowly return to the starting position. Let your muscles relax completely between repetitions. Repeat __________ times. Complete this exercise __________ times a day. Straight leg raises, supine This exercise strengthens the  muscles in the front of your thigh (quadriceps). Lie on your back (supine position) with your left / right leg extended and your other knee bent. Tense the muscles in the front of your left / right thigh. You should see your kneecap slide up or see increased dimpling just above your knee. Keep these muscles tight as you raise your leg 4-6 inches (10-15 cm) off the floor. Do not let your knee bend. Hold this position for __________ seconds. Keep these muscles tense as you lower your leg. Relax the muscles slowly and completely between repetitions. Repeat __________ times. Complete this exercise __________ times a day. Hip abductors, standing This exercise strengthens the  muscles that move the leg and hip joint away from the center of the body (hip abductors). Tie one end of a rubber exercise band or tubing to a secure surface, such as a chair, table, or pole. Loop the other end of the band or tubing around your left / right ankle. Keeping your ankle with the band or tubing directly opposite the secured end, step away until there is tension in the tubing or band. Hold on to a chair, table, or pole as needed for balance. Lift your left / right leg out to your side. While you do this: Keep your back upright. Keep your shoulders over your hips. Keep your toes pointing forward. Make sure to use your hip muscles to slowly lift your leg. Do not tip your body or forcefully lift your leg. Hold this position for __________ seconds. Slowly return to the starting position. Repeat __________ times. Complete this exercise __________ times a day. Squats This exercise strengthens the muscles in the front of your thigh (quadriceps). Stand in a door frame so your feet and knees are in line with the frame. You may place your hands on the frame for balance. Slowly bend your knees and lower your hips like you are going to sit in a chair. Keep your lower legs in a straight-up-and-down position. Do not let your hips go lower than your knees. Do not bend your knees lower than told by your health care provider. If your hip pain increases, do not bend as low. Hold this position for ___________ seconds. Slowly push with your legs to return to standing. Do not use your hands to pull yourself to standing. Repeat __________ times. Complete this exercise __________ times a day. This information is not intended to replace advice given to you by your health care provider. Make sure you discuss any questions you have with your health care provider. Document Revised: 12/29/2020 Document Reviewed: 12/29/2020 Elsevier Patient Education  [place].

## 2021-07-16 LAB — URINE CULTURE
MICRO NUMBER:: 12657392
SPECIMEN QUALITY:: ADEQUATE

## 2021-07-16 LAB — URINALYSIS, ROUTINE W REFLEX MICROSCOPIC
Bacteria, UA: NONE SEEN /HPF
Bilirubin Urine: NEGATIVE
Glucose, UA: NEGATIVE
Hyaline Cast: NONE SEEN /LPF
Ketones, ur: NEGATIVE
Nitrite: NEGATIVE
Protein, ur: NEGATIVE
Specific Gravity, Urine: 1.02 (ref 1.001–1.035)
pH: 5.5 (ref 5.0–8.0)

## 2021-07-18 NOTE — Progress Notes (Signed)
Celiac labs pending Urine with blood culture negative UTI -Is she agreeable to CT ab/pelvis to work this up?  If negative rec urology consult  Vitamin D improved and normal continue daily vitamin D3  Thyroid lab normal low normal Is she agreeable to reduce the dose to 150 mcg daily?  Liver kidneys normal   Cholesterol improved but goal total <200 and LDL closer to 100   Iron normal  Blood cts normla

## 2021-07-19 LAB — CELIAC DISEASE ANTIBODY SCREEN
Antigliadin Abs, IgA: 2 units (ref 0–19)
IgA/Immunoglobulin A, Serum: 81 mg/dL (ref 64–422)
Transglutaminase IgA: <2 U/mL (ref 0–3)

## 2021-07-25 ENCOUNTER — Other Ambulatory Visit: Payer: Self-pay | Admitting: Internal Medicine

## 2021-07-25 DIAGNOSIS — E039 Hypothyroidism, unspecified: Secondary | ICD-10-CM

## 2021-07-25 DIAGNOSIS — M25561 Pain in right knee: Secondary | ICD-10-CM | POA: Diagnosis not present

## 2021-07-25 DIAGNOSIS — M25552 Pain in left hip: Secondary | ICD-10-CM | POA: Diagnosis not present

## 2021-07-25 DIAGNOSIS — M25551 Pain in right hip: Secondary | ICD-10-CM | POA: Diagnosis not present

## 2021-07-25 DIAGNOSIS — E559 Vitamin D deficiency, unspecified: Secondary | ICD-10-CM

## 2021-07-25 DIAGNOSIS — M25562 Pain in left knee: Secondary | ICD-10-CM | POA: Diagnosis not present

## 2021-07-25 MED ORDER — VITAMIN D (ERGOCALCIFEROL) 1.25 MG (50000 UNIT) PO CAPS: 50000.0000 [IU] | ORAL_CAPSULE | ORAL | 1 refills | Status: DC

## 2021-07-25 MED ORDER — SYNTHROID 150 MCG PO TABS: 150.0000 ug | ORAL_TABLET | Freq: Every day | ORAL | 3 refills | Status: DC

## 2021-07-25 NOTE — Addendum Note (Signed)
Addended by: Orland Mustard on: 07/25/2021 08:26 AM   Modules accepted: Orders

## 2021-07-28 ENCOUNTER — Telehealth: Payer: Self-pay | Admitting: Internal Medicine

## 2021-07-28 DIAGNOSIS — M25551 Pain in right hip: Secondary | ICD-10-CM | POA: Diagnosis not present

## 2021-07-28 DIAGNOSIS — M25561 Pain in right knee: Secondary | ICD-10-CM | POA: Diagnosis not present

## 2021-07-28 DIAGNOSIS — M25552 Pain in left hip: Secondary | ICD-10-CM | POA: Diagnosis not present

## 2021-07-28 DIAGNOSIS — M25562 Pain in left knee: Secondary | ICD-10-CM | POA: Diagnosis not present

## 2021-07-28 NOTE — Telephone Encounter (Signed)
Call pt advise cardiology is trying to reach for referral but referral denied they've tried 3 x to reach her  Does she still want cardiology referral

## 2021-07-28 NOTE — Telephone Encounter (Signed)
ejection Reason - Patient did not respond" Alfred Levins said on Jul 28, 2021 4:28 PM  "left 3rd message on voice mail to call for Cardiology appt. " Alfred Levins said on Jul 28, 2021 4:26 PM  "left 2nd message on voicemail to call [facility] Cardiology for appointment." Alfred Levins said on Jul 26, 2021 3:53 PM  "Left message on patient's voice mail to call us for appt with Cardiology." Alfred Levins said on Jul 19, 2021 11:42 AM  Msg from [facility] cardiovascular disease

## 2021-07-29 DIAGNOSIS — M25562 Pain in left knee: Secondary | ICD-10-CM | POA: Diagnosis not present

## 2021-07-29 DIAGNOSIS — M25561 Pain in right knee: Secondary | ICD-10-CM | POA: Diagnosis not present

## 2021-07-29 DIAGNOSIS — M25552 Pain in left hip: Secondary | ICD-10-CM | POA: Diagnosis not present

## 2021-07-29 DIAGNOSIS — M25551 Pain in right hip: Secondary | ICD-10-CM | POA: Diagnosis not present

## 2021-08-01 DIAGNOSIS — M25552 Pain in left hip: Secondary | ICD-10-CM | POA: Diagnosis not present

## 2021-08-01 DIAGNOSIS — M25561 Pain in right knee: Secondary | ICD-10-CM | POA: Diagnosis not present

## 2021-08-01 DIAGNOSIS — M25562 Pain in left knee: Secondary | ICD-10-CM | POA: Diagnosis not present

## 2021-08-01 DIAGNOSIS — M25551 Pain in right hip: Secondary | ICD-10-CM | POA: Diagnosis not present

## 2021-08-01 NOTE — Telephone Encounter (Signed)
Cardiology attempted to call Patient multiple times. Mychart message with information sent to the Patient

## 2021-08-03 DIAGNOSIS — M25551 Pain in right hip: Secondary | ICD-10-CM | POA: Diagnosis not present

## 2021-08-03 DIAGNOSIS — M25562 Pain in left knee: Secondary | ICD-10-CM | POA: Diagnosis not present

## 2021-08-03 DIAGNOSIS — M25561 Pain in right knee: Secondary | ICD-10-CM | POA: Diagnosis not present

## 2021-08-03 DIAGNOSIS — M25552 Pain in left hip: Secondary | ICD-10-CM | POA: Diagnosis not present

## 2021-08-15 DIAGNOSIS — M25562 Pain in left knee: Secondary | ICD-10-CM | POA: Diagnosis not present

## 2021-08-15 DIAGNOSIS — M25551 Pain in right hip: Secondary | ICD-10-CM | POA: Diagnosis not present

## 2021-08-15 DIAGNOSIS — M25552 Pain in left hip: Secondary | ICD-10-CM | POA: Diagnosis not present

## 2021-08-15 DIAGNOSIS — M25561 Pain in right knee: Secondary | ICD-10-CM | POA: Diagnosis not present

## 2021-08-16 ENCOUNTER — Ambulatory Visit
Admission: RE | Admit: 2021-08-16 | Discharge: 2021-08-16 | Disposition: A | Discharge status: Home / Self Care | Payer: Medicare Other | Source: Ambulatory Visit | Attending: Internal Medicine | Admitting: Internal Medicine

## 2021-08-16 ENCOUNTER — Other Ambulatory Visit: Payer: Self-pay

## 2021-08-16 DIAGNOSIS — Z8543 Personal history of malignant neoplasm of ovary: Secondary | ICD-10-CM | POA: Diagnosis not present

## 2021-08-16 DIAGNOSIS — M1611 Unilateral primary osteoarthritis, right hip: Secondary | ICD-10-CM | POA: Diagnosis not present

## 2021-08-16 DIAGNOSIS — C561 Malignant neoplasm of right ovary: Secondary | ICD-10-CM | POA: Diagnosis not present

## 2021-08-16 DIAGNOSIS — R319 Hematuria, unspecified: Secondary | ICD-10-CM | POA: Diagnosis not present

## 2021-08-16 DIAGNOSIS — N281 Cyst of kidney, acquired: Secondary | ICD-10-CM | POA: Diagnosis not present

## 2021-08-16 LAB — POCT I-STAT CREATININE: Creatinine, Ser: 0.7 mg/dL (ref 0.44–1.00)

## 2021-08-16 MED ORDER — IOHEXOL 300 MG/ML  SOLN
100.0000 mL | Freq: Once | INTRAMUSCULAR | Status: AC | PRN
  Administered 2021-08-16: 11:00:00 100 mL via INTRAVENOUS

## 2021-08-18 ENCOUNTER — Telehealth: Payer: Self-pay | Admitting: Internal Medicine

## 2021-08-18 DIAGNOSIS — M25552 Pain in left hip: Secondary | ICD-10-CM | POA: Diagnosis not present

## 2021-08-18 DIAGNOSIS — M25551 Pain in right hip: Secondary | ICD-10-CM | POA: Diagnosis not present

## 2021-08-18 DIAGNOSIS — M25561 Pain in right knee: Secondary | ICD-10-CM | POA: Diagnosis not present

## 2021-08-18 DIAGNOSIS — M25562 Pain in left knee: Secondary | ICD-10-CM | POA: Diagnosis not present

## 2021-08-18 NOTE — Telephone Encounter (Signed)
Left message to return call 

## 2021-08-18 NOTE — Telephone Encounter (Signed)
Patient returned office phone call for test results.

## 2021-08-24 DIAGNOSIS — M25552 Pain in left hip: Secondary | ICD-10-CM | POA: Diagnosis not present

## 2021-08-24 DIAGNOSIS — M25561 Pain in right knee: Secondary | ICD-10-CM | POA: Diagnosis not present

## 2021-08-24 DIAGNOSIS — M25562 Pain in left knee: Secondary | ICD-10-CM | POA: Diagnosis not present

## 2021-08-24 DIAGNOSIS — M25551 Pain in right hip: Secondary | ICD-10-CM | POA: Diagnosis not present

## 2021-08-26 DIAGNOSIS — M25551 Pain in right hip: Secondary | ICD-10-CM | POA: Diagnosis not present

## 2021-08-26 DIAGNOSIS — M25561 Pain in right knee: Secondary | ICD-10-CM | POA: Diagnosis not present

## 2021-08-26 DIAGNOSIS — M25562 Pain in left knee: Secondary | ICD-10-CM | POA: Diagnosis not present

## 2021-08-26 DIAGNOSIS — M25552 Pain in left hip: Secondary | ICD-10-CM | POA: Diagnosis not present

## 2021-08-31 DIAGNOSIS — M25562 Pain in left knee: Secondary | ICD-10-CM | POA: Diagnosis not present

## 2021-08-31 DIAGNOSIS — M25552 Pain in left hip: Secondary | ICD-10-CM | POA: Diagnosis not present

## 2021-08-31 DIAGNOSIS — M25561 Pain in right knee: Secondary | ICD-10-CM | POA: Diagnosis not present

## 2021-08-31 DIAGNOSIS — M25551 Pain in right hip: Secondary | ICD-10-CM | POA: Diagnosis not present

## 2021-09-02 DIAGNOSIS — M25551 Pain in right hip: Secondary | ICD-10-CM | POA: Diagnosis not present

## 2021-09-02 DIAGNOSIS — M25561 Pain in right knee: Secondary | ICD-10-CM | POA: Diagnosis not present

## 2021-09-02 DIAGNOSIS — M25552 Pain in left hip: Secondary | ICD-10-CM | POA: Diagnosis not present

## 2021-09-02 DIAGNOSIS — M25562 Pain in left knee: Secondary | ICD-10-CM | POA: Diagnosis not present

## 2021-09-05 DIAGNOSIS — M25561 Pain in right knee: Secondary | ICD-10-CM | POA: Diagnosis not present

## 2021-09-05 DIAGNOSIS — M25552 Pain in left hip: Secondary | ICD-10-CM | POA: Diagnosis not present

## 2021-09-05 DIAGNOSIS — M25562 Pain in left knee: Secondary | ICD-10-CM | POA: Diagnosis not present

## 2021-09-05 DIAGNOSIS — M25551 Pain in right hip: Secondary | ICD-10-CM | POA: Diagnosis not present

## 2021-09-09 DIAGNOSIS — M25561 Pain in right knee: Secondary | ICD-10-CM | POA: Diagnosis not present

## 2021-09-09 DIAGNOSIS — M25552 Pain in left hip: Secondary | ICD-10-CM | POA: Diagnosis not present

## 2021-09-09 DIAGNOSIS — M25562 Pain in left knee: Secondary | ICD-10-CM | POA: Diagnosis not present

## 2021-09-09 DIAGNOSIS — M25551 Pain in right hip: Secondary | ICD-10-CM | POA: Diagnosis not present

## 2021-09-12 ENCOUNTER — Inpatient Hospital Stay: Payer: Medicare Other | Attending: Obstetrics and Gynecology

## 2021-09-22 ENCOUNTER — Other Ambulatory Visit: Payer: Self-pay | Admitting: Obstetrics and Gynecology

## 2021-09-22 DIAGNOSIS — Z1231 Encounter for screening mammogram for malignant neoplasm of breast: Secondary | ICD-10-CM

## 2021-09-22 IMAGING — CT CT ABD-PELV W/ CM
2 of 6 series · 13 of 36 positions shown, 16 images · IV contrast (omnipaque)
Comparison: 08/16/2018

CLINICAL DATA: Ovarian cancer surveillance, status post
hysterectomy

EXAM:
CT CHEST, ABDOMEN, AND PELVIS WITH CONTRAST
TECHNIQUE: Multidetector CT imaging of the chest, abdomen and pelvis was
performed following the standard protocol during bolus
administration of intravenous contrast.
CONTRAST:  100mL OMNIPAQUE IOHEXOL 300 MG/ML SOLN, additional oral
enteric contrast

[Series 505: thins · axial · 0.71mm/px · z∈[-654,-72]mm · 10 of 925 slices shown, 13 images]
[im 47/925  mediastinal]
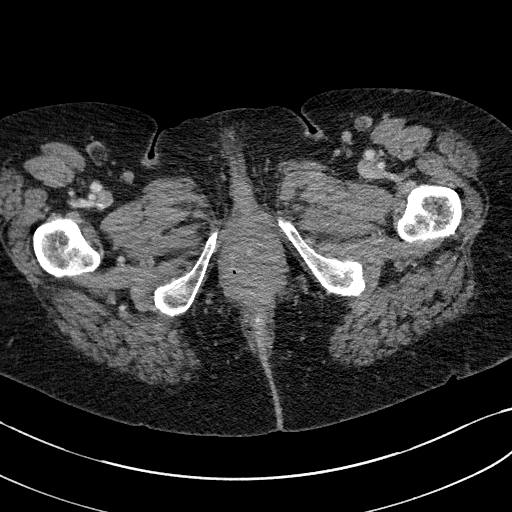
[im 47/925  lung]
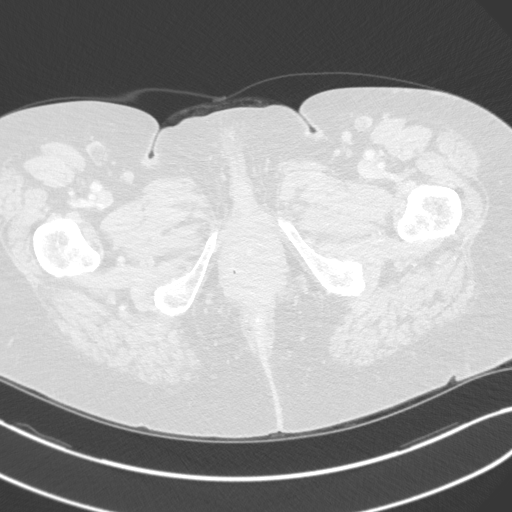
[im 139/925  lung]
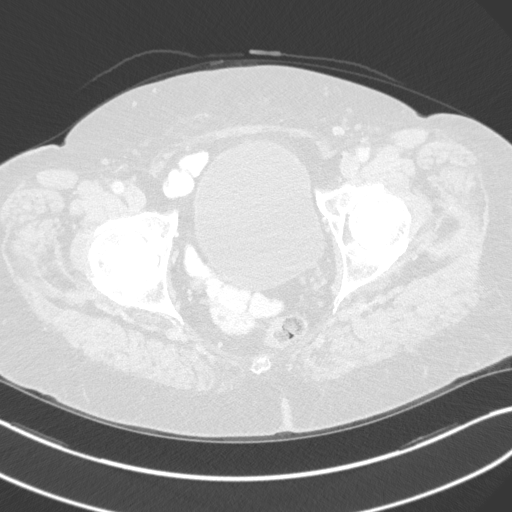
[im 232/925  lung]
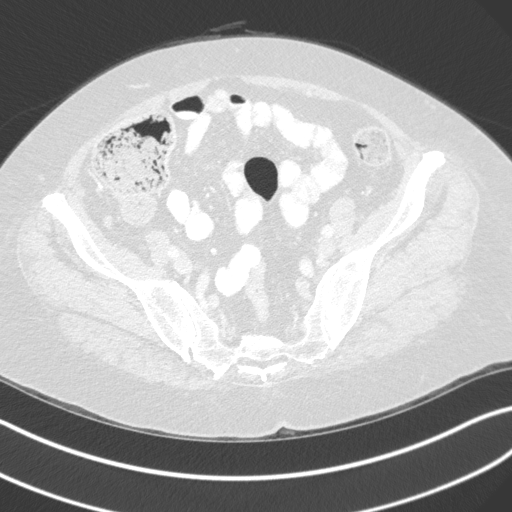
[im 324/925  lung]
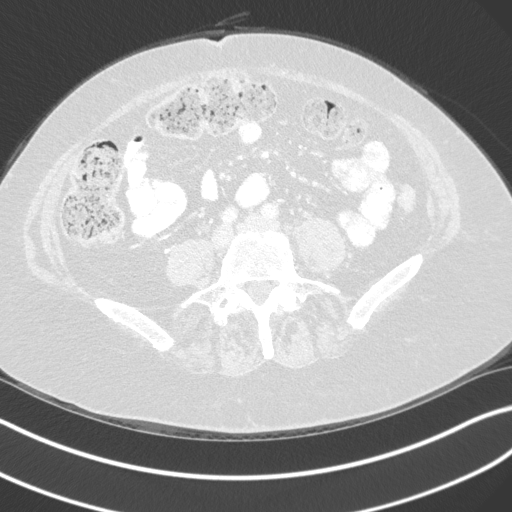
[im 416/925  mediastinal]
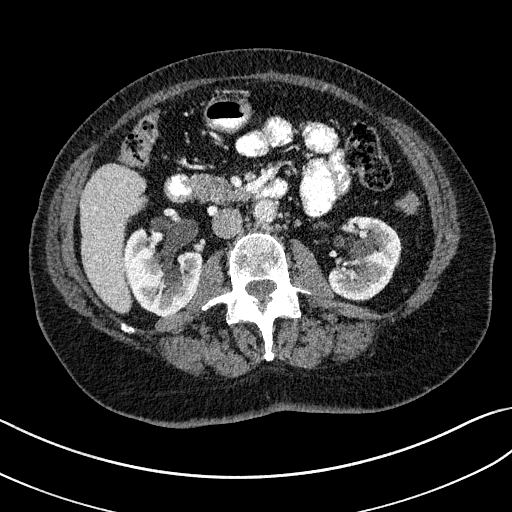
[im 416/925  lung]
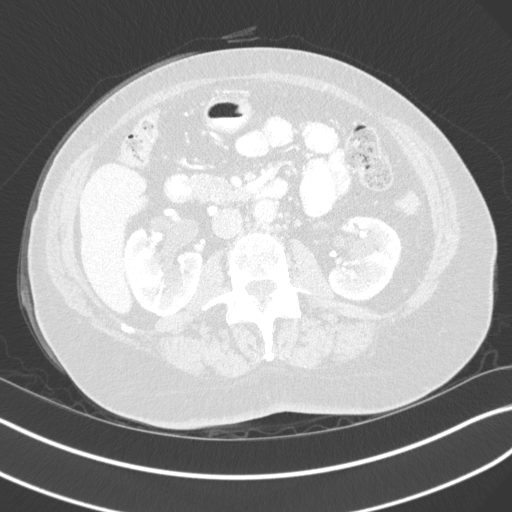
[im 509/925  lung]
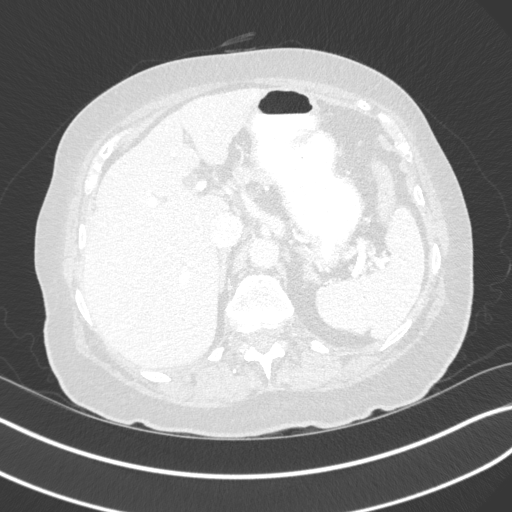
[im 601/925  lung]
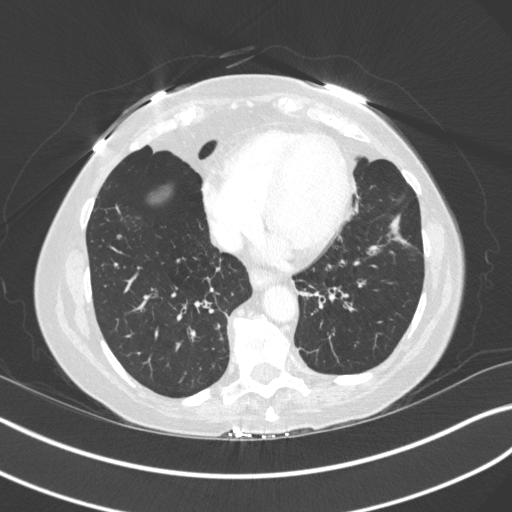
[im 694/925  lung]
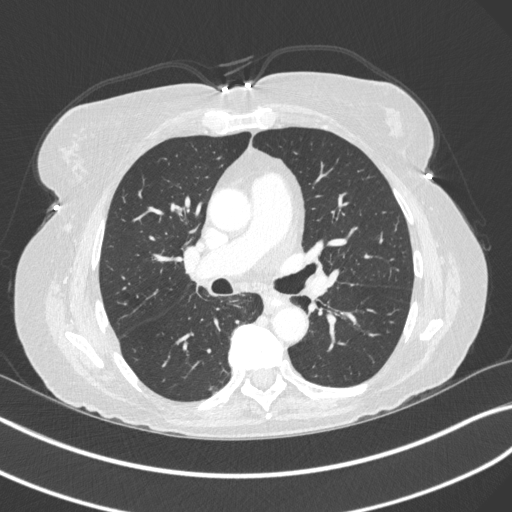
[im 786/925  mediastinal]
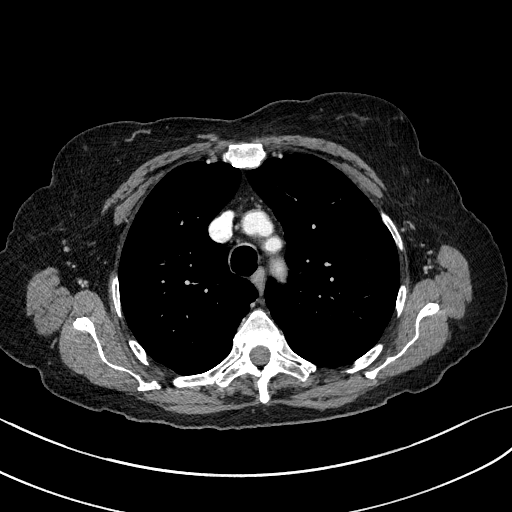
[im 786/925  lung]
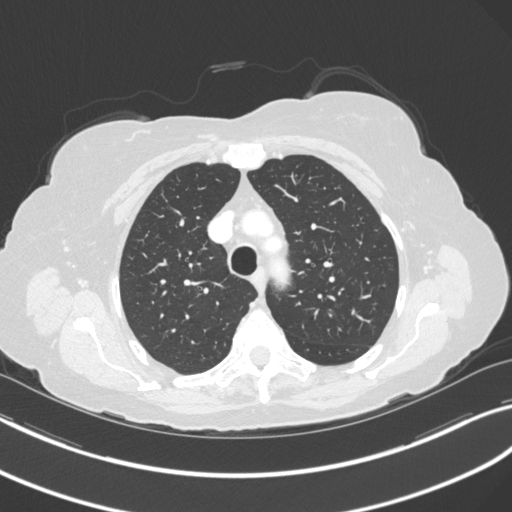
[im 878/925  lung]
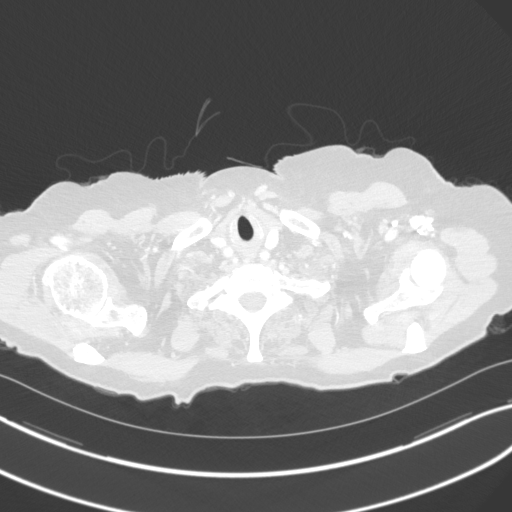

[Series 507: coronals · coronal · 0.71mm/px · 3 of 147 slices shown]
[im 30/147  lung]
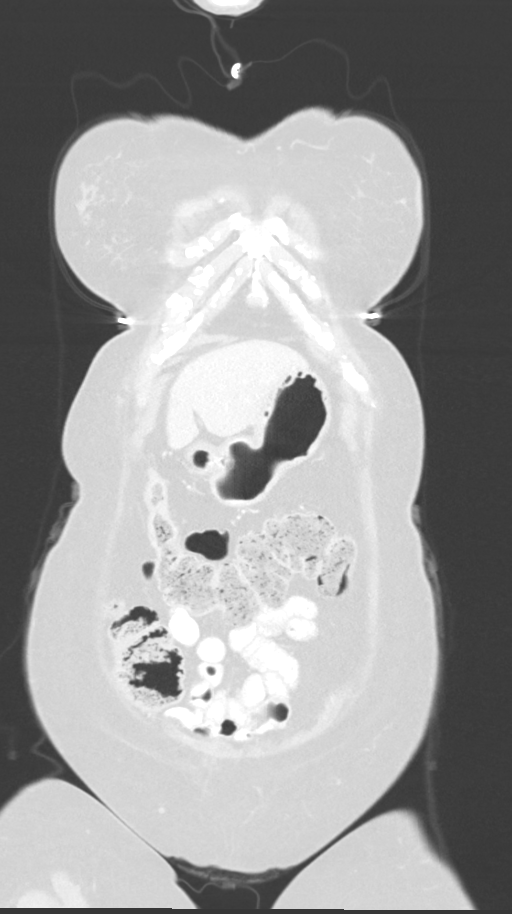
[im 59/147  lung]
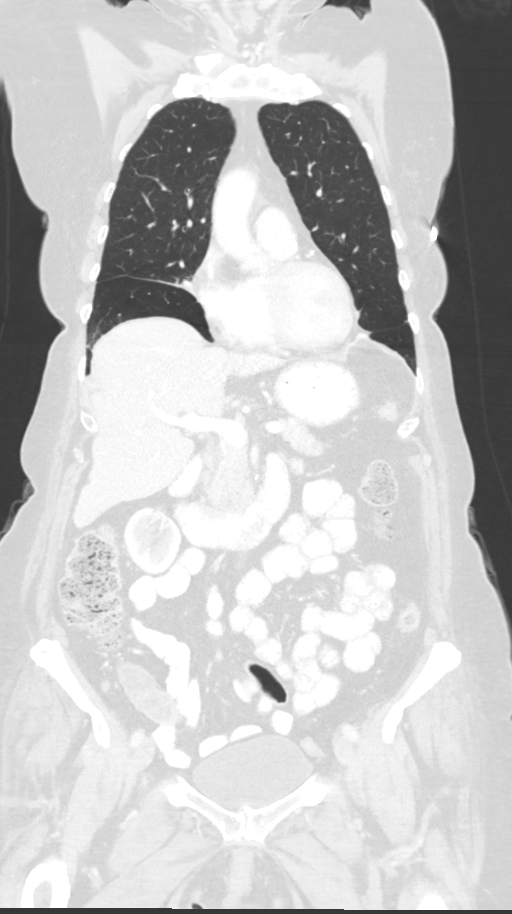
[im 88/147  lung]
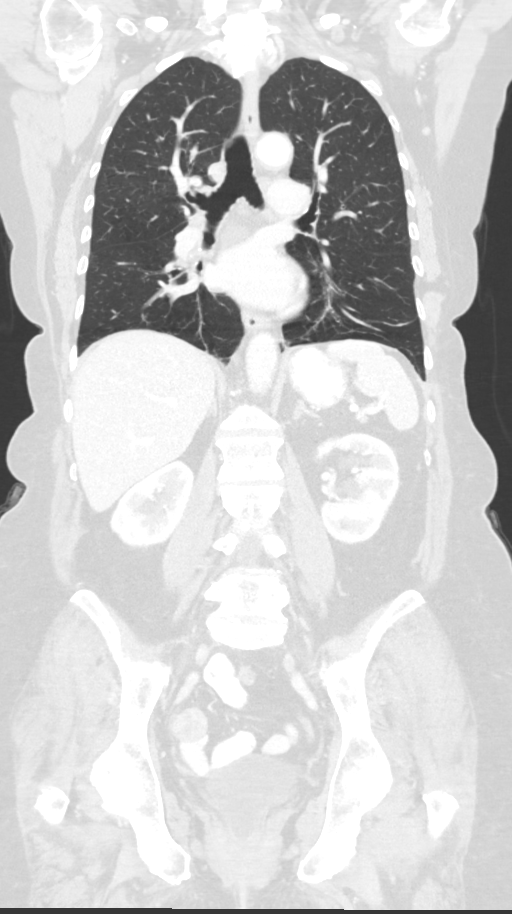

[13 of 36 positions shown; findings below may reference images not displayed]

FINDINGS: CT CHEST FINDINGS

Cardiovascular: Scattered aortic atherosclerosis. Normal heart size.
No pericardial effusion.

Mediastinum/Nodes: No enlarged mediastinal, hilar, or axillary lymph
nodes. Thyroid gland, trachea, and esophagus demonstrate no
significant findings.

Lungs/Pleura: Bandlike scarring and volume loss of the right middle
lobe unchanged compared to prior examination. No pleural effusion
or pneumothorax.

Musculoskeletal: No chest wall mass or suspicious bone lesions
identified.

CT ABDOMEN PELVIS FINDINGS

Hepatobiliary: No focal liver abnormality is seen. Status post
cholecystectomy. No biliary dilatation.

Pancreas: Unremarkable. No pancreatic ductal dilatation or
surrounding inflammatory changes.

Spleen: Normal in size without significant abnormality.

Adrenals/Urinary Tract: Adrenal glands are unremarkable. Kidneys are
normal, without renal calculi, solid lesion, or hydronephrosis.
Unchanged bilateral parapelvic cysts. Bladder is unremarkable.

Stomach/Bowel: Stomach is within normal limits. Appendix appears
normal. No evidence of bowel wall thickening, distention, or
inflammatory changes.

Vascular/Lymphatic: Aortic atherosclerosis. No enlarged abdominal or
pelvic lymph nodes.

Reproductive: Status post hysterectomy.

Other: No abdominal wall hernia or abnormality. No abdominopelvic
ascites.

Musculoskeletal: No acute or significant osseous findings.
IMPRESSION: 1. Status post hysterectomy. No evidence of recurrent or metastatic
disease in the chest, abdomen, or pelvis.
2. Scarring of the lower lungs, unchanged compared to prior
examination and in keeping with sequelae of prior infection or
inflammation
3. Aortic Atherosclerosis ([id number]-ZL5.5).

## 2021-09-23 ENCOUNTER — Telehealth: Payer: Self-pay

## 2021-09-23 DIAGNOSIS — M25562 Pain in left knee: Secondary | ICD-10-CM | POA: Diagnosis not present

## 2021-09-23 DIAGNOSIS — N3281 Overactive bladder: Secondary | ICD-10-CM

## 2021-09-23 DIAGNOSIS — C44311 Basal cell carcinoma of skin of nose: Secondary | ICD-10-CM | POA: Diagnosis not present

## 2021-09-23 DIAGNOSIS — M25552 Pain in left hip: Secondary | ICD-10-CM | POA: Diagnosis not present

## 2021-09-23 DIAGNOSIS — M25551 Pain in right hip: Secondary | ICD-10-CM | POA: Diagnosis not present

## 2021-09-23 DIAGNOSIS — N3946 Mixed incontinence: Secondary | ICD-10-CM

## 2021-09-23 DIAGNOSIS — M25561 Pain in right knee: Secondary | ICD-10-CM | POA: Diagnosis not present

## 2021-09-23 MED ORDER — GEMTESA 75 MG PO TABS: 75.0000 mg | ORAL_TABLET | Freq: Every day | ORAL | 11 refills | Status: DC

## 2021-09-23 NOTE — Telephone Encounter (Signed)
Incoming call from pt who states that she no longer wishes to take Myrbetriq and would like to go back to her previous medication. Per pt's chart she has failed several anticholingeric bladder medications including Gemtesa. Called pt to inquire about what medication she wishes to resume, no answer. LM for pt to call back.

## 2021-09-23 NOTE — Telephone Encounter (Signed)
Pt calls office back and states she would like to resume Gemtesa. Advised pt that she reported this medication ineffective in the past. Patient states it may or may not have been effective, she cannot remember and would like to attempt the medication again. RX sent to pharmacy.

## 2021-09-26 DIAGNOSIS — M25552 Pain in left hip: Secondary | ICD-10-CM | POA: Diagnosis not present

## 2021-09-26 DIAGNOSIS — M25561 Pain in right knee: Secondary | ICD-10-CM | POA: Diagnosis not present

## 2021-09-26 DIAGNOSIS — M25551 Pain in right hip: Secondary | ICD-10-CM | POA: Diagnosis not present

## 2021-09-26 DIAGNOSIS — M25562 Pain in left knee: Secondary | ICD-10-CM | POA: Diagnosis not present

## 2021-10-03 DIAGNOSIS — M25551 Pain in right hip: Secondary | ICD-10-CM | POA: Diagnosis not present

## 2021-10-03 DIAGNOSIS — M25561 Pain in right knee: Secondary | ICD-10-CM | POA: Diagnosis not present

## 2021-10-03 DIAGNOSIS — M25552 Pain in left hip: Secondary | ICD-10-CM | POA: Diagnosis not present

## 2021-10-03 DIAGNOSIS — M25562 Pain in left knee: Secondary | ICD-10-CM | POA: Diagnosis not present

## 2021-10-04 ENCOUNTER — Encounter: Payer: Self-pay | Admitting: Internal Medicine

## 2021-10-04 ENCOUNTER — Other Ambulatory Visit: Payer: Self-pay

## 2021-10-04 ENCOUNTER — Ambulatory Visit (INDEPENDENT_AMBULATORY_CARE_PROVIDER_SITE_OTHER): Payer: Medicare Other | Admitting: Internal Medicine

## 2021-10-04 VITALS — BP 118/68 | HR 82 | Temp 97.6°F | Ht 66.0 in | Wt 190.6 lb

## 2021-10-04 DIAGNOSIS — R0602 Shortness of breath: Secondary | ICD-10-CM

## 2021-10-04 DIAGNOSIS — N3281 Overactive bladder: Secondary | ICD-10-CM | POA: Diagnosis not present

## 2021-10-04 DIAGNOSIS — R319 Hematuria, unspecified: Secondary | ICD-10-CM | POA: Diagnosis not present

## 2021-10-04 DIAGNOSIS — N390 Urinary tract infection, site not specified: Secondary | ICD-10-CM | POA: Diagnosis not present

## 2021-10-04 NOTE — Progress Notes (Signed)
Chief Complaint  Patient presents with   bladder issues   Referral   F/u  1. Recurrent uti, overactive bladder and hematuria wants to see Dr. Erlene Quan  2. Sob with exertion worse with 20 steps echo 2019 nl ef and DD   Review of Systems  Constitutional:  Negative for weight loss.  HENT:  Negative for hearing loss.   Eyes:  Negative for blurred vision.  Respiratory:  Negative for shortness of breath.   Cardiovascular:  Negative for chest pain.  Gastrointestinal:  Negative for abdominal pain and blood in stool.  Genitourinary:  Positive for frequency. Negative for dysuria.  Musculoskeletal:  Negative for falls and joint pain.  Skin:  Negative for rash.  Neurological:  Negative for headaches.  Psychiatric/Behavioral:  Negative for depression.   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[place])    2/2 flu shot 05/2018,  intubated x 8 days   Hyperlipidemia    Lymphedema    Overactive bladder    Thyroid disease    UTI (urinary tract infection)    Vitamin D deficiency    Past Surgical History:  Procedure Laterality Date   ABDOMINAL HYSTERECTOMY     02/15/18    CHOLECYSTECTOMY  2008   LAPAROSCOPIC VAGINAL HYSTERECTOMY WITH SALPINGO OOPHORECTOMY  02/15/2018   PORTACATH PLACEMENT Left 02/25/2018   Procedure: INSERTION PORT-A-CATH;  Surgeon: Robert Bellow, MD;  Location: [facility] ORS;  Service: General;  Laterality: Left;   TOTAL ABDOMINAL HYSTERECTOMY W/ BILATERAL SALPINGOOPHORECTOMY  02/15/2018   lymph nodes removed   Family History  Problem Relation Age of Onset   Sudden death Mother 69       drowning in hurricane Katrina   Pancreatic cancer Father        deceased early 108s; smoker   Alcohol abuse Father    COPD Father    Coronary artery  disease Brother    Melanoma Sister        deceased at 20   COPD Sister    Prostate cancer Maternal Uncle        deceased 30   Breast cancer Paternal Aunt        unsure of this dx   Social History   Socioeconomic History   Marital status: Widowed    Spouse name: Not on file   Number of children: Not on file   Years of education: Not on file   Highest education level: Not on file  Occupational History   Not on file  Tobacco Use   Smoking status: Former    Packs/day: 1.00    Years: 25.00    Pack years: 25.00    Types: Cigarettes    Quit date: 02/26/1984    Years since quitting: 37.6   Smokeless tobacco: Never   Tobacco comments:    age 35 to 89 1 ppd   Vaping Use   Vaping Use: Never used  Substance and Sexual Activity   Alcohol use: Never   Drug use: Never   Sexual activity: Not Currently  Other Topics Concern   Not on file  Social History Stage manager, bachelors degree    Lives in [place] here visiting daughter [name]  Former smoker quit age 69 y.o    Social Determinants of Radio broadcast assistant Strain: Low Risk    Difficulty of Paying Living Expenses: Not hard at all  Food Insecurity: No Food Insecurity   Worried About Charity fundraiser in the Last Year: Never true   Arboriculturist in the Last Year: Never true  Transportation Needs: No Transportation Needs   Lack of Transportation (Medical): No   Lack of Transportation (Non-Medical): No  Physical Activity: Not on file  Stress: No Stress Concern Present   Feeling of Stress : Not at all  Social Connections: Not on file  Intimate Partner Violence: Not At Risk   Fear of Current or Ex-Partner: No   Emotionally Abused: No   Physically Abused: No   Sexually Abused: No   Current Meds  Medication Sig   aspirin EC 81 MG tablet Take 81 mg by mouth daily.   finasteride (PROSCAR) 5 MG tablet Take 5 mg by mouth daily.   SYNTHROID 150 MCG tablet Take 1 tablet (150 mcg total) by mouth daily before  breakfast. 30 min before food d/c 175 mcg   Vibegron (GEMTESA) 75 MG TABS Take 75 mg by mouth daily.   Vitamin D, Ergocalciferol, (DRISDOL) 1.25 MG (50000 UNIT) CAPS capsule Take 1 capsule (50,000 Units total) by mouth once a week. D3   Allergies  Allergen Reactions   Crestor [Rosuvastatin]     Myopathy     Epinephrine Other (See Comments)    Funny feeling, heart racing    Influenza Vaccines     ?GBS 05/2018    Lipitor [Atorvastatin Calcium]     Myalgias     Recent Results (from the past 2160 hour(s))  CA 125     Status: None   Collection Time: 07/13/21  8:41 AM  Result Value Ref Range   Cancer Antigen (CA) 125 18.2 0.0 - 38.1 U/mL    Comment: (NOTE) Roche Diagnostics Electrochemiluminescence Immunoassay (ECLIA) Values obtained with different assay methods or kits cannot be used interchangeably.  Results cannot be interpreted as absolute evidence of the presence or absence of malignant disease. Performed At: [address] [facility] [place], [place] [id number] Rush Farmer MD IT:[id number]   Comprehensive metabolic panel     Status: None   Collection Time: 07/15/21 11:33 AM  Result Value Ref Range   Sodium 143 135 - 145 mEq/L   Potassium 4.2 3.5 - 5.1 mEq/L   Chloride 106 96 - 112 mEq/L   CO2 31 19 - 32 mEq/L   Glucose, Bld 84 70 - 99 mg/dL   BUN 20 6 - 23 mg/dL   Creatinine, Ser 0.72 0.40 - 1.20 mg/dL   Total Bilirubin 0.6 0.2 - 1.2 mg/dL   Alkaline Phosphatase 70 39 - 117 U/L   AST 16 0 - 37 U/L   ALT 15 0 - 35 U/L   Total Protein 6.5 6.0 - 8.3 g/dL   Albumin 4.3 3.5 - 5.2 g/dL   GFR 78.58 >60.00 mL/min    Comment: Calculated using the CKD-EPI Creatinine Equation (2021)   Calcium 9.4 8.4 - 10.5 mg/dL  Lipid panel     Status: Abnormal   Collection Time: 07/15/21 11:33 AM  Result Value Ref Range   Cholesterol 204 (H) 0 - 200 mg/dL    Comment: ATP III Classification       Desirable:  < 200 mg/dL  Borderline High:  200 - 239 mg/dL           High:  > = 240 mg/dL   Triglycerides 99.0 0.0 - 149.0 mg/dL    Comment: Normal:  <150 mg/dLBorderline High:  150 - 199 mg/dL   HDL 61.10 >39.00 mg/dL   VLDL 19.8 0.0 - 40.0 mg/dL   LDL Cholesterol 123 (H) 0 - 99 mg/dL   Total CHOL/HDL Ratio 3     Comment:                Men          Women1/2 Average Risk     3.4          3.3Average Risk          5.0          4.42X Average Risk          9.6          7.13X Average Risk          15.0          11.0                       NonHDL 142.57     Comment: NOTE:  Non-HDL goal should be 30 mg/dL higher than patient's LDL goal (i.e. LDL goal of < 70 mg/dL, would have non-HDL goal of < 100 mg/dL)  CBC with Differential/Platelet     Status: None   Collection Time: 07/15/21 11:33 AM  Result Value Ref Range   WBC 4.6 4.0 - 10.5 K/uL   RBC 4.43 3.87 - 5.11 Mil/uL   Hemoglobin 13.7 12.0 - 15.0 g/dL   HCT 42.2 36.0 - 46.0 %   MCV 95.2 78.0 - 100.0 fl   MCHC 32.4 30.0 - 36.0 g/dL   RDW 13.4 11.5 - 15.5 %   Platelets 251.0 150.0 - 400.0 K/uL   Neutrophils Relative % 58.9 43.0 - 77.0 %   Lymphocytes Relative 28.3 12.0 - 46.0 %   Monocytes Relative 8.9 3.0 - 12.0 %   Eosinophils Relative 2.5 0.0 - 5.0 %   Basophils Relative 1.4 0.0 - 3.0 %   Neutro Abs 2.7 1.4 - 7.7 K/uL   Lymphs Abs 1.3 0.7 - 4.0 K/uL   Monocytes Absolute 0.4 0.1 - 1.0 K/uL   Eosinophils Absolute 0.1 0.0 - 0.7 K/uL   Basophils Absolute 0.1 0.0 - 0.1 K/uL  TSH     Status: None   Collection Time: 07/15/21 11:33 AM  Result Value Ref Range   TSH 0.94 0.35 - 5.50 uIU/mL  Urinalysis, Routine w reflex microscopic     Status: Abnormal   Collection Time: 07/15/21 11:33 AM  Result Value Ref Range   Color, Urine YELLOW YELLOW   APPearance CLEAR CLEAR   Specific Gravity, Urine 1.020 1.001 - 1.035   pH 5.5 5.0 - 8.0   Glucose, UA NEGATIVE NEGATIVE   Bilirubin Urine NEGATIVE NEGATIVE   Ketones, ur NEGATIVE NEGATIVE   Hgb urine dipstick 1+ (A) NEGATIVE   Protein, ur NEGATIVE NEGATIVE    Nitrite NEGATIVE NEGATIVE   Leukocytes,Ua 2+ (A) NEGATIVE   WBC, UA 0-5 0 - 5 /HPF   RBC / HPF 0-2 0 - 2 /HPF   Squamous Epithelial / LPF 0-5 < OR = 5 /HPF   Bacteria, UA NONE SEEN NONE SEEN /HPF   Hyaline Cast NONE SEEN NONE SEEN /LPF   Yeast MODERATE (A) NONE  SEEN /HPF  Urine Culture     Status: None   Collection Time: 07/15/21 11:33 AM   Specimen: Urine  Result Value Ref Range   MICRO NUMBER: [id number]    SPECIMEN QUALITY: Adequate    Sample Source NOT GIVEN    STATUS: FINAL    Result:      Mixed genital flora isolated. These superficial bacteria are not indicative of a urinary tract infection. No further organism identification is warranted on this specimen. If clinically indicated, recollect clean-catch, mid-stream urine and transfer  immediately to Urine Culture Transport Tube.   Vitamin D (25 hydroxy)     Status: None   Collection Time: 07/15/21 11:33 AM  Result Value Ref Range   VITD 66.49 30.00 - 100.00 ng/mL  Celiac Disease Antibody Screen     Status: None   Collection Time: 07/15/21 11:33 AM  Result Value Ref Range   IgA/Immunoglobulin A, Serum 81 64 - 422 mg/dL   Antigliadin Abs, IgA 2 0 - 19 units    Comment:                    Negative                   0 - 19                    Weak Positive             20 - 30                    Moderate to Strong Positive   >30    Transglutaminase IgA <2 0 - 3 U/mL    Comment:                               Negative        0 -  3                               Weak Positive   4 - 10                               Positive           >10  Tissue Transglutaminase (tTG) has been identified  as the endomysial antigen.  Studies have demonstr-  ated that endomysial IgA antibodies have over 99%  specificity for gluten sensitive enteropathy.   IBC + Ferritin     Status: None   Collection Time: 07/15/21 11:33 AM  Result Value Ref Range   Iron 95 42 - 145 ug/dL   Transferrin 248.0 212.0 - 360.0 mg/dL   Saturation Ratios 27.4 20.0 -  50.0 %   Ferritin 46.8 10.0 - 291.0 ng/mL   TIBC 347.2 250.0 - 450.0 mcg/dL  I-STAT creatinine     Status: None   Collection Time: 08/16/21 10:41 AM  Result Value Ref Range   Creatinine, Ser 0.70 0.44 - 1.00 mg/dL   Objective  Body mass index is 30.76 kg/m. Wt Readings from Last 3 Encounters:  10/04/21 190 lb 9.6 oz (86.5 kg)  07/15/21 187 lb 9.6 oz (85.1 kg)  07/13/21 186 lb 4.8 oz (84.5 kg)   Temp Readings from Last 3 Encounters:  10/04/21 97.6 F (36.4 C) (Oral)  07/15/21 Marland Kitchen)  97 F (36.1 C) (Temporal)  07/13/21 (!) 96.5 F (35.8 C)   BP Readings from Last 3 Encounters:  10/04/21 118/68  07/15/21 118/80  07/13/21 134/71   Pulse Readings from Last 3 Encounters:  10/04/21 82  07/15/21 72  07/13/21 79    Physical Exam Vitals and nursing note reviewed.  Constitutional:      Appearance: Normal appearance. She is well-developed and well-groomed.  HENT:     Head: Normocephalic and atraumatic.  Eyes:     Conjunctiva/sclera: Conjunctivae normal.     Pupils: Pupils are equal, round, and reactive to light.  Cardiovascular:     Rate and Rhythm: Normal rate and regular rhythm.     Heart sounds: Normal heart sounds. No murmur heard. Pulmonary:     Effort: Pulmonary effort is normal.     Breath sounds: Normal breath sounds.  Abdominal:     General: Abdomen is flat. Bowel sounds are normal.     Tenderness: There is no abdominal tenderness.  Musculoskeletal:        General: No tenderness.  Skin:    General: Skin is warm and dry.  Neurological:     General: No focal deficit present.     Mental Status: She is alert and oriented to person, place, and time. Mental status is at baseline.     Cranial Nerves: Cranial nerves 2-12 are intact.     Gait: Gait is intact.  Psychiatric:        Attention and Perception: Attention and perception normal.        Mood and Affect: Mood and affect normal.        Speech: Speech normal.        Behavior: Behavior normal. Behavior is  cooperative.        Thought Content: Thought content normal.        Cognition and Memory: Cognition and memory normal.        Judgment: Judgment normal.    Assessment  Plan  Hematuria, unspecified type - Plan: Ambulatory referral to Urology Overactive bladder - Plan: Ambulatory referral to Urology Recurrent UTI - Plan: Ambulatory referral to Urology, Urinalysis, Routine w reflex microscopic, Urine Culture ?urodynamic studies of bladder and cystoscopy Dr. Erlene Quan    SOB (shortness of breath) on exertion - Plan: Ambulatory referral to Pulmonology  May need pfts    HM No further vaccines I.e FLU (had GBS after flu vaccine) due to GBS flu shot had 05/31/18. She had had flu shot prev and no sx's.  Tdap per pt had 5 years ago get record pt to get record  Prevnar, pna 23, shingles vaccines  -pt to track down records.  Pfizer 3/3 not had booster tolerated she is hesistent due to GBS to get 4th dose   Former smoker max 1 ppd from age 60 to 64 no FH lung cancer    Out of age window pap s/p total hysterectomy 02/15/18 for ovarian cancer with b/l ovaries and left fallopian tube effected negative b/l lymph nodes 3 LN bx'ed    02/06/2019 negative cologaurd -never had colonoscopy   Mammogram per pt had 6 months ago in [place] requested records today  -mammogram 10/19/20 negative  Ordered 10/27/21 mammogram scheduled     DEXA +osteopenia    rec healthy diet and exercise Provider: Dr. Olivia Mackie McLean-[facility]oc[facility]

## 2021-10-04 NOTE — Patient Instructions (Addendum)
[facility] 5.0 (6)  Chiropractor [place]  [phone] Open ? Closes 12?PM Medicare accepted  ?urodynamic studies of bladder and cystoscopy Dr. Charolette Forward Oral Tablets What is this medication? VIBEGRON (vye BEG ron) is used to treat overactive bladder. This medicine reduces the amount of bathroom visits. It may also help to control wetting accidents. This medicine may be used for other purposes; ask your health care provider or pharmacist if you have questions. COMMON BRAND NAME(S): GEMTESA What should I tell my care team before I take this medication? They need to know if you have any of these conditions: kidney disease liver disease prostate disease trouble passing urine an unusual or allergic reaction to vibegron, other medicines, foods, dyes, or preservatives pregnant or trying to get pregnant breast-feeding How should I use this medication? Take this medicine by mouth with water. Take it as directed on the prescription label at the same time every day. Swallow the tablets whole. You can take it with or without food. If it upsets your stomach, take it with food. You may also crush the tablet and put the contents in 1 tablespoon (15 mL) of applesauce. Swallow the medicine and applesauce right away. Take your medicine at regular intervals. Do not take it more often than directed. Keep taking it unless your health care provider tells you to stop. Talk to your pediatrician regarding the use of this medicine in children. Special care may be needed. Overdosage: If you think you have taken too much of this medicine contact a poison control center or emergency room at once. NOTE: This medicine is only for you. Do not share this medicine with others. What if I miss a dose? If you miss a dose, take it as soon as you can. If it is almost time for your next dose, take only that dose. Do not take double or extra doses. What may interact with this  medication? digoxin This list may not describe all possible interactions. Give your health care provider a list of all the medicines, herbs, non-prescription drugs, or dietary supplements you use. Also tell them if you smoke, drink alcohol, or use illegal drugs. Some items may interact with your medicine. What should I watch for while using this medication? Visit your health care provider for regular checks on your progress. Tell your health care provider if your symptoms do not start to get better or if they get worse. You may need to limit your intake of tea, coffee, caffeinated sodas, or alcohol. These drinks may make your symptoms worse. What side effects may I notice from receiving this medication? Side effects that you should report to your doctor or health care professional as soon as possible: allergic reactions like skin rash, itching or hives, swelling of the face, lips, or tongue infection (fever, chills, cough, sore throat, pain or trouble passing urine) trouble passing urine or change in the amount of urine Side effects that usually do not require medical attention (report these to your doctor or health care professional if they continue or are bothersome): constipation diarrhea dry mouth headache nasal congestion (runny or stuffy nose) nausea sore throat This list may not describe all possible side effects. Call your doctor for medical advice about side effects. You may report side effects to FDA at 1-800-FDA-1088. Where should I keep my medication? Keep out of the reach of children and pets. Store at room temperature between 20 and 25 degrees C (68 and 77 degrees  F). NOTE: This sheet is a summary. It may not cover all possible information. If you have questions about this medicine, talk to your doctor, pharmacist, or health care provider.  2022 Elsevier/Gold Standard (2021-05-03 00:00:00)

## 2021-10-05 NOTE — Progress Notes (Signed)
Margie see if I can see her sometime soon.  4:30 appointment okay.

## 2021-10-06 ENCOUNTER — Ambulatory Visit (INDEPENDENT_AMBULATORY_CARE_PROVIDER_SITE_OTHER): Payer: Medicare Other | Admitting: Pulmonary Disease

## 2021-10-06 ENCOUNTER — Encounter: Payer: Self-pay | Admitting: Pulmonary Disease

## 2021-10-06 ENCOUNTER — Other Ambulatory Visit: Payer: Self-pay

## 2021-10-06 VITALS — BP 110/78 | HR 94 | Temp 97.8°F | Ht 66.0 in | Wt 189.0 lb

## 2021-10-06 DIAGNOSIS — R0602 Shortness of breath: Secondary | ICD-10-CM | POA: Diagnosis not present

## 2021-10-06 DIAGNOSIS — M25551 Pain in right hip: Secondary | ICD-10-CM | POA: Diagnosis not present

## 2021-10-06 DIAGNOSIS — I5189 Other ill-defined heart diseases: Secondary | ICD-10-CM

## 2021-10-06 DIAGNOSIS — M25561 Pain in right knee: Secondary | ICD-10-CM | POA: Diagnosis not present

## 2021-10-06 DIAGNOSIS — J449 Chronic obstructive pulmonary disease, unspecified: Secondary | ICD-10-CM

## 2021-10-06 DIAGNOSIS — M25552 Pain in left hip: Secondary | ICD-10-CM | POA: Diagnosis not present

## 2021-10-06 DIAGNOSIS — M25562 Pain in left knee: Secondary | ICD-10-CM | POA: Diagnosis not present

## 2021-10-06 MED ORDER — LEVALBUTEROL TARTRATE 45 MCG/ACT IN AERO: 2.0000 | INHALATION_SPRAY | Freq: Four times a day (QID) | RESPIRATORY_TRACT | 3 refills | Status: DC | PRN

## 2021-10-06 MED ORDER — TRELEGY ELLIPTA 100-62.5-25 MCG/ACT IN AEPB: 1.0000 | INHALATION_SPRAY | Freq: Every day | RESPIRATORY_TRACT | 11 refills | Status: DC

## 2021-10-06 MED ORDER — TRELEGY ELLIPTA 100-62.5-25 MCG/ACT IN AEPB: 1.0000 | INHALATION_SPRAY | Freq: Every day | RESPIRATORY_TRACT | 0 refills | Status: DC

## 2021-10-06 NOTE — Progress Notes (Signed)
Subjective:    Patient ID: Melissa Anderson, female    DOB: 1941-08-18, 81 y.o.   MRN: [id number] Chief Complaint  Patient presents with   Follow-up    HPI Melissa Anderson is an 81 year old former smoker with a 25-pack-year history of smoking and a history as noted below who presents for follow-up and second opinion on the issue of shortness of breath.  Recall that she was seen by Dr. Patricia Pesa on 30 March 2021 with a complaint of shortness of breath.  Portia has had a very complex medical history with prior Guillain-Barr syndrome and ovarian cancer status post TLH and BSO bilateral pelvic lymphadenectomy and omental peritoneal biopsies at [facility] in 2019 followed by 4 cycles of CarboTaxol chemotherapy between July 2019 and October 2019.  Since then she has been deemed to be cancer free.  A CT chest of October 2021 showed that she had some bronchiectasis.  Her shortness of breath is mostly on exertion.  This has decreased her exercise tolerance significantly.  She states that she takes about 15-20 steps before she gets very short of breath.  She notices wheezing.  She has a dry nonproductive cough without any hemoptysis.  Of note after her initial evaluation by Dr. Mortimer Fries in August, she had pulmonary function testing performed and this shows that she had moderate obstructive defect.  She is not on any inhalers as of yet.  The patient here lately has also noted some lower extremity edema.  No orthopnea or paroxysmal nocturnal dyspnea.  No chest pain.  She does not endorse any other symptomatology.  Of note prior echocardiogram in 6948 showed diastolic dysfunction grade 1.  Query  worsening.  DATA 04/12/2021 PFTs: FEV1 1.13 L or 52% predicted, FVC 1.94 L or 67% predicted, FEV1/FVC 58%, no bronchodilator study was performed, lung volumes were normal.  Flow volume loop was consistent with obstruction.  Consistent with moderate obstructive defect.  Review of Systems A 10 point review of systems was performed  and it is as noted above otherwise negative.  Past Medical History:  Diagnosis Date   Aortic atherosclerosis ([place])    Arthritis    right shoulder   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    04/2021 m[facility]s   Cancer ([place]) 2019   b/l ovaries and left fallopian tube pelvic lymph nodes negative    Guillain Barr syndrome ([place])    2/2 flu shot 05/2018,  intubated x 8 days   Hyperlipidemia    Lymphedema    Overactive bladder    Thyroid disease    UTI (urinary tract infection)    Vitamin D deficiency    Past Surgical History:  Procedure Laterality Date   ABDOMINAL HYSTERECTOMY     02/15/18    CHOLECYSTECTOMY  2008   LAPAROSCOPIC VAGINAL HYSTERECTOMY WITH SALPINGO OOPHORECTOMY  02/15/2018   PORTACATH PLACEMENT Left 02/25/2018   Procedure: INSERTION PORT-A-CATH;  Surgeon: Robert Bellow, MD;  Location: [facility] ORS;  Service: General;  Laterality: Left;   TOTAL ABDOMINAL HYSTERECTOMY W/ BILATERAL SALPINGOOPHORECTOMY  02/15/2018   lymph nodes removed   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   Aortic atherosclerosis ([place]) 07/14/2020   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   Chemotherapy-induced peripheral neuropathy ([place]) 02/11/2020   History of ovarian cancer 02/11/2020   Acute pain of right shoulder 08/15/2018   Guillain-Barre syndrome ([place]) 06/17/2018  Chemotherapy-induced neuropathy ([place]) 05/14/2018   Vitamin D deficiency 05/08/2018   Neuropathy 05/08/2018   Overactive bladder 05/08/2018   Bilateral leg edema 05/08/2018   HLD (hyperlipidemia) 05/08/2018   Goals of care, counseling/discussion 02/25/2018   Ovarian cancer ([place]) 02/22/2018   Graves' disease 02/13/2018   Family History  Problem Relation Age of Onset   Sudden death Mother 20       drowning in hurricane Kat[place]   Pancreatic cancer Father        deceased early 24s; smoker    Alcohol abuse Father    COPD Father    Coronary artery disease Brother    Melanoma Sister        deceased at 37   COPD Sister    Prostate cancer Maternal Uncle        deceased 61   Breast cancer Paternal Aunt        unsure of this dx   Social History   Tobacco Use   Smoking status: Former    Packs/day: 1.00    Years: 25.00    Pack years: 25.00    Types: Cigarettes    Quit date: 02/26/1984    Years since quitting: 37.6   Smokeless tobacco: Never   Tobacco comments:    age 31 to 59 1 ppd   Substance Use Topics   Alcohol use: Never   Allergies  Allergen Reactions   Crestor [Rosuvastatin]     Myopathy     Epinephrine Other (See Comments)    Funny feeling, heart racing    Influenza Vaccines     ?GBS 05/2018    Lipitor [Atorvastatin Calcium]     Myalgias     Current Meds  Medication Sig   aspirin EC 81 MG tablet Take 81 mg by mouth daily.   finasteride (PROSCAR) 5 MG tablet Take 5 mg by mouth daily.   SYNTHROID 150 MCG tablet Take 1 tablet (150 mcg total) by mouth daily before breakfast. 30 min before food d/c 175 mcg   Vibegron (GEMTESA) 75 MG TABS Take 75 mg by mouth daily.   Vitamin D, Ergocalciferol, (DRISDOL) 1.25 MG (50000 UNIT) CAPS capsule Take 1 capsule (50,000 Units total) by mouth once a week. D3   Immunization History  Administered Date(s) Administered   Influenza, High Dose Seasonal PF 05/31/2018   Influenza-Unspecified 05/13/2018   PFIZER Comirnaty(Gray Top)Covid-19 Tri-Sucrose Vaccine 11/15/2020   PFIZER(Purple Top)SARS-COV-2 Vaccination 02/09/2020, 03/02/2020       Objective:   Physical Exam BP 110/78 (BP Location: Left Arm, Patient Position: Sitting, Cuff Size: Normal)   Pulse 94   Temp 97.8 F (36.6 C) (Oral)   Ht 5\' 6"  (1.676 m)   Wt 189 lb (85.7 kg)   SpO2 96%   BMI 30.51 kg/m  GENERAL: Well-developed, well-nourished woman who looks age appropriate.  No acute distress, fully ambulatory somewhat anxious. HEAD: Normocephalic,  atraumatic.  EYES: Pupils equal, round, reactive to light.  No scleral icterus.  MOUTH: Nose/mouth/throat not examined due to institutional masking requirements for COVID-19.   NECK: Supple. No thyromegaly. Trachea midline. No JVD.  No adenopathy. PULMONARY: Good air entry bilaterally.  Coarse breath sounds with some wheezing throughout noted. CARDIOVASCULAR: S1 and S2. Regular rate and rhythm.  No rubs, murmurs or gallops heard. ABDOMEN: Benign. MUSCULOSKELETAL: No joint deformity, no clubbing, trace to 1+ edema of the lower extremities.  NEUROLOGIC: No overt focal deficit, no gait disturbance, speech is fluent. SKIN: Intact,warm,dry. PSYCH: Anxious.  Ambulatory oximetry was performed:  Lowest oxygen saturation was 90%, patient noted increased dyspnea during ambulation.     Assessment & Plan:     ICD-10-CM   1. Asthmatic bronchitis , chronic (HCC) - moderate  J44.9    Trial of Trelegy Ellipta Albuterol as needed Appears to be poorly compensated currently    2. SOB (shortness of breath)  R06.02 ECHOCARDIOGRAM COMPLETE    DISCONTINUED: Fluticasone-Umeclidin-Vilant (TRELEGY ELLIPTA) 100-62.5-25 MCG/ACT AEPB   Cannot exclude cardiac etiology Will optimize pulmonary function Symptoms out of proportion to PFTs    3. Grade I diastolic dysfunction  T03.54    Query worsening Echocardiogram     Orders Placed This Encounter  Procedures   ECHOCARDIOGRAM COMPLETE    Standing Status:   Future    Number of Occurrences:   1    Standing Expiration Date:   04/05/2022    Scheduling Instructions:     Next available.    Order Specific Question:   Where should this test be performed    Answer:   [facility]    Order Specific Question:   Perflutren DEFINITY (image enhancing agent) should be administered unless hypersensitivity or allergy exist    Answer:   Administer Perflutren    Order Specific Question:   Reason for exam-Echo    Answer:   Dyspnea  R06.00   Meds ordered this encounter   Medications   Fluticasone-Umeclidin-Vilant (TRELEGY ELLIPTA) 100-62.5-25 MCG/ACT AEPB    Sig: Inhale 1 puff into the lungs daily.    Dispense:  28 each    Refill:  0    Order Specific Question:   Lot Number?    Answer:   [id number]    Order Specific Question:   Expiration Date?    Answer:   04/29/2023    Order Specific Question:   Manufacturer?    Answer:   [employer] [[id number]]    Order Specific Question:   Quantity    Answer:   1   levalbuterol (XOPENEX HFA) 45 MCG/ACT inhaler    Sig: Inhale 2 puffs into the lungs every 6 (six) hours as needed for wheezing or shortness of breath.    Dispense:  1 each    Refill:  3   Fluticasone-Umeclidin-Vilant (TRELEGY ELLIPTA) 100-62.5-25 MCG/ACT AEPB    Sig: Inhale 1 puff into the lungs daily.    Dispense:  28 each    Refill:  11   We will see the patient in follow-up in 2 months time she is to contact us prior to that time should any new difficulties arise.  Renold Don, MD Advanced Bronchoscopy P[facility]    *This note was dictated using voice recognition software/Dragon.  Despite best efforts to proofread, errors can occur which can change the meaning. Any transcriptional errors that result from this process are unintentional and may not be fully corrected at the time of dictation.

## 2021-10-06 NOTE — Patient Instructions (Signed)
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  I think that this is the cause of your shortness of breath.  We are checking an echocardiogram to reevaluate your heart as issues with your heart can also affect shortness of breath.  We are giving you a trial of an inhaler called Trelegy you will be using 1 puff daily make sure you rinse your mouth well after you use it.  You have a sample that will last you 14 days and there will be a prescription already sent into your pharmacy.  You also will have an inhaler that you can use as "rescue" you can use this during the day if you get excessively short of breath.  You may use it up to 4 times a day.  We will see you in follow-up in 2 months time call sooner should any new problems arise.  We will call you with the results of your testing as they come available.

## 2021-10-07 ENCOUNTER — Other Ambulatory Visit: Payer: Self-pay | Admitting: Internal Medicine

## 2021-10-07 ENCOUNTER — Other Ambulatory Visit (HOSPITAL_COMMUNITY): Payer: Self-pay

## 2021-10-07 DIAGNOSIS — N3000 Acute cystitis without hematuria: Secondary | ICD-10-CM

## 2021-10-07 LAB — URINALYSIS, ROUTINE W REFLEX MICROSCOPIC
Bilirubin Urine: NEGATIVE
Glucose, UA: NEGATIVE
Hgb urine dipstick: NEGATIVE
Hyaline Cast: NONE SEEN /LPF
Ketones, ur: NEGATIVE
Nitrite: NEGATIVE
Protein, ur: NEGATIVE
Specific Gravity, Urine: 1.021 (ref 1.001–1.035)
pH: <=5 (ref 5.0–8.0)

## 2021-10-07 LAB — URINE CULTURE
MICRO NUMBER:: 12974755
SPECIMEN QUALITY:: ADEQUATE

## 2021-10-07 LAB — MICROSCOPIC MESSAGE

## 2021-10-07 MED ORDER — CIPROFLOXACIN HCL 500 MG PO TABS: 500.0000 mg | ORAL_TABLET | Freq: Two times a day (BID) | ORAL | 0 refills | Status: AC

## 2021-10-10 DIAGNOSIS — M25552 Pain in left hip: Secondary | ICD-10-CM | POA: Diagnosis not present

## 2021-10-10 DIAGNOSIS — M25562 Pain in left knee: Secondary | ICD-10-CM | POA: Diagnosis not present

## 2021-10-10 DIAGNOSIS — M25561 Pain in right knee: Secondary | ICD-10-CM | POA: Diagnosis not present

## 2021-10-10 DIAGNOSIS — M25551 Pain in right hip: Secondary | ICD-10-CM | POA: Diagnosis not present

## 2021-10-10 NOTE — Progress Notes (Incomplete)
10/10/21 10:51 AM   Melissa Anderson [birth year] [id number]  Referring provider:  McLean-Scocuzza, Nino Glow, MD [place],  [place] [id number] No chief complaint on file.    HPI: Melissa Anderson is a 81 y.o.female with a personal history of  ovarian cancer s/p vaginal hysterectomy with salpingo-oophorectomy and OAB wet who previously failed Vesicare and oxybutynin who presents today for follow-up on overactive bladder, hematuria, rUTIs, and history of bladder cancer.   She has failed several anticholinergics and Gemtesa. She is currently on Myrbetriq.         PMH: Past Medical History:  Diagnosis Date   Aortic atherosclerosis ([place])    Arthritis    right shoulder   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    04/2021 [facility]   Cancer ([place]) 2019   b/l ovaries and left fallopian tube pelvic lymph nodes negative    Guillain Barr syndrome ([place])    2/2 flu shot 05/2018,  intubated x 8 days   Hyperlipidemia    Lymphedema    Overactive bladder    Thyroid disease    UTI (urinary tract infection)    Vitamin D deficiency     Surgical History: Past Surgical History:  Procedure Laterality Date   ABDOMINAL HYSTERECTOMY     02/15/18    CHOLECYSTECTOMY  2008   LAPAROSCOPIC VAGINAL HYSTERECTOMY WITH SALPINGO OOPHORECTOMY  02/15/2018   PORTACATH PLACEMENT Left 02/25/2018   Procedure: INSERTION PORT-A-CATH;  Surgeon: Robert Bellow, MD;  Location: [facility] ORS;  Service: General;  Laterality: Left;   TOTAL ABDOMINAL HYSTERECTOMY W/ BILATERAL SALPINGOOPHORECTOMY  02/15/2018   lymph nodes removed    Home Medications:  Allergies as of 10/11/2021       Reactions   Novocain [procaine] Palpitations   Crestor [rosuvastatin]    Myopathy    Influenza Vaccines    ?GBS 05/2018   Lipitor [atorvastatin Calcium]    Myalgias         Medication List        Accurate as of October 10, 2021 10:51 AM. If you have any questions, ask your nurse or doctor.           aspirin EC 81 MG tablet Take 81 mg by mouth daily.   ciprofloxacin 500 MG tablet Commonly known as: Cipro Take 1 tablet (500 mg total) by mouth 2 (two) times daily for 5 days.   finasteride 5 MG tablet Commonly known as: PROSCAR Take 5 mg by mouth daily.   Gemtesa 75 MG Tabs Generic drug: Vibegron Take 75 mg by mouth daily.   levalbuterol 45 MCG/ACT inhaler Commonly known as: Xopenex HFA Inhale 2 puffs into the lungs every 6 (six) hours as needed for wheezing or shortness of breath.   Synthroid 150 MCG tablet Generic drug: levothyroxine Take 1 tablet (150 mcg total) by mouth daily before breakfast. 30 min before food d/c 175 mcg   Trelegy Ellipta 100-62.5-25 MCG/ACT Aepb Generic drug: Fluticasone-Umeclidin-Vilant Inhale 1 puff into the lungs daily.   Trelegy Ellipta 100-62.5-25 MCG/ACT Aepb Generic drug: Fluticasone-Umeclidin-Vilant Inhale 1 puff into the lungs daily. Start taking on: October 13, 2021   Vitamin D (Ergocalciferol) 1.25 MG (50000 UNIT) Caps capsule Commonly known as: DRISDOL Take 1 capsule (50,000 Units total) by mouth once a week. D3        Allergies:  Allergies  Allergen Reactions   Novocain [Procaine] Palpitations   Crestor [Rosuvastatin]     Myopathy     Influenza Vaccines     ?  GBS 05/2018    Lipitor [Atorvastatin Calcium]     Myalgias      Family History: Family History  Problem Relation Age of Onset   Sudden death Mother 30       drowning in hurricane Katrina   Pancreatic cancer Father        deceased early 57s; smoker   Alcohol abuse Father    COPD Father    Coronary artery disease Brother    Melanoma Sister        deceased at 61   COPD Sister    Prostate cancer Maternal Uncle        deceased 50   Breast cancer Paternal Aunt        unsure of this dx    Social History:  reports that she quit smoking about 37 years ago. Her smoking use included cigarettes. She has a 25.00 pack-year smoking history. She has never used  smokeless tobacco. She reports that she does not drink alcohol and does not use drugs.   Physical Exam: There were no vitals taken for this visit.  Constitutional:  Alert and oriented, No acute distress. HEENT: [place] AT, moist mucus membranes.  Trachea midline, no masses. Cardiovascular: No clubbing, cyanosis, or edema. Respiratory: Normal respiratory effort, no increased work of breathing. Skin: No rashes, bruises or suspicious lesions. Neurologic: Grossly intact, no focal deficits, moving all 4 extremities. Psychiatric: Normal mood and affect.  Laboratory Data: Lab Results  Component Value Date   CREATININE 0.70 08/16/2021   Lab Results  Component Value Date   HGBA1C 5.3 05/09/2018    Urinalysis   Pertinent Imaging:   Assessment & Plan:     No follow-ups on file.  I,Kailey Littlejohn,acting as a Education administrator for Hollice Espy, MD.,have documented all relevant documentation on the behalf of Hollice Espy, MD,as directed by  Hollice Espy, MD while in the presence of Hollice Espy, [place] [address] [place], [place] [address] [phone]

## 2021-10-11 ENCOUNTER — Ambulatory Visit: Payer: Medicare Other | Admitting: Urology

## 2021-10-12 ENCOUNTER — Other Ambulatory Visit: Payer: Self-pay

## 2021-10-12 ENCOUNTER — Inpatient Hospital Stay: Payer: Medicare Other | Attending: Obstetrics and Gynecology

## 2021-10-12 ENCOUNTER — Inpatient Hospital Stay (HOSPITAL_BASED_OUTPATIENT_CLINIC_OR_DEPARTMENT_OTHER): Payer: Medicare Other | Admitting: Obstetrics and Gynecology

## 2021-10-12 VITALS — BP 139/69 | HR 77 | Resp 18 | Wt 191.0 lb

## 2021-10-12 DIAGNOSIS — Z90710 Acquired absence of both cervix and uterus: Secondary | ICD-10-CM | POA: Diagnosis not present

## 2021-10-12 DIAGNOSIS — R32 Unspecified urinary incontinence: Secondary | ICD-10-CM

## 2021-10-12 DIAGNOSIS — Z9221 Personal history of antineoplastic chemotherapy: Secondary | ICD-10-CM | POA: Insufficient documentation

## 2021-10-12 DIAGNOSIS — E0500 Thyrotoxicosis with diffuse goiter without thyrotoxic crisis or storm: Secondary | ICD-10-CM | POA: Insufficient documentation

## 2021-10-12 DIAGNOSIS — C561 Malignant neoplasm of right ovary: Secondary | ICD-10-CM

## 2021-10-12 DIAGNOSIS — E559 Vitamin D deficiency, unspecified: Secondary | ICD-10-CM | POA: Diagnosis not present

## 2021-10-12 DIAGNOSIS — M25551 Pain in right hip: Secondary | ICD-10-CM | POA: Diagnosis not present

## 2021-10-12 DIAGNOSIS — G8929 Other chronic pain: Secondary | ICD-10-CM | POA: Diagnosis not present

## 2021-10-12 DIAGNOSIS — Z8543 Personal history of malignant neoplasm of ovary: Secondary | ICD-10-CM

## 2021-10-12 DIAGNOSIS — N3281 Overactive bladder: Secondary | ICD-10-CM | POA: Diagnosis not present

## 2021-10-12 DIAGNOSIS — Z87891 Personal history of nicotine dependence: Secondary | ICD-10-CM | POA: Diagnosis not present

## 2021-10-12 DIAGNOSIS — I890 Lymphedema, not elsewhere classified: Secondary | ICD-10-CM | POA: Insufficient documentation

## 2021-10-12 DIAGNOSIS — Z90722 Acquired absence of ovaries, bilateral: Secondary | ICD-10-CM | POA: Diagnosis not present

## 2021-10-12 DIAGNOSIS — E039 Hypothyroidism, unspecified: Secondary | ICD-10-CM | POA: Diagnosis not present

## 2021-10-12 DIAGNOSIS — E785 Hyperlipidemia, unspecified: Secondary | ICD-10-CM | POA: Insufficient documentation

## 2021-10-12 DIAGNOSIS — I700 Atherosclerosis of aorta: Secondary | ICD-10-CM | POA: Insufficient documentation

## 2021-10-12 DIAGNOSIS — M25559 Pain in unspecified hip: Secondary | ICD-10-CM

## 2021-10-12 NOTE — Progress Notes (Signed)
[place] at [facility]  Telephone:(336) [phone] Fax:(336) [phone]  Patient Care Team: McLean-Scocuzza, Nino Glow, MD as PCP - General (Internal Medicine) Gillis Ends, MD as Referring Physician (Obstetrics and Gynecology) Bary Castilla Forest Gleason, MD as Surgeon (General Surgery) Sindy Guadeloupe, MD as Consulting Physician (Oncology) Clent Jacks, RN as Oncology Nurse Navigator   Name of the patient: Melissa Anderson  [id number]  1941/08/24   Date of visit: 10/12/2021  Gynecologic Oncology Interval Visit   Referring Provider: Dr Mike Gip  Chief Complaint: stage II ovarian cancer  Subjective:  Melissa Anderson is a 81 y.o. 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 followed by 4 cycles of carbo-taxol chemotherapy 03/22/18-06/03/2018, discontinued due to GBS from flu shot, NED since, who returns to clinic for follow up.   CA 125 is pending at time of visit.   She is being followed by urology for blood in urine and recent changes in her incontinence medications. Complains of weight gain. She continues to have right hip pain which is chronic and unchanged. She has mammogram scheduled for March 2023.    Component Ref Range & Units 3 mo ago (07/13/21) 9 mo ago (01/05/21) 1 yr ago (09/08/20) 1 yr ago (06/02/20) 1 yr ago (02/11/20) 3 yr ago (10/09/18)  Cancer Antigen (CA) 125 0.0 - 38.1 U/mL 18.2  16.4 CM  16.8 CM  18.6 CM  16.0 CM  16.9 CM        Gynecologic Oncology History:  She was at her PCP office in March and was complaining of intermittent weakness and pain shooting down her right leg. Her PCP ordered an MRI of her lumbosacral spine which demonstrated this right adnexal mass as an incidental finding.   Radiologic Imaging: 11/12/2017 Lumbosacral spine MRI scan noted a multicystic 4 x 5.5 cm right  adnexal mass with fluid blood levels. Prominent uterine endometrial stripe.   12/20/2017 Pelvic Ultrasound: Uterus measures 9.7 x 4.1 x 5.9cm and is retroverted. Endometrial stripe measures 9 mm. Within it, as outlines by a small amount of fluid, there is a 1.4 x 0.7 x 1.4 cm polypoid echogenic structure with internal blood flow and small cystic components. A similar appearing but smaller 5 x 4 x 3 mm echogenic nodular density is noted within the endometrial cavity as well. The uterus is otherwise normal in appearance. There is a multiloculated complex cystic structure in the right adnexa measuring 6.3 x 6.1 x 4.7 cm. This has solid appearing components as well as anechoic portions, separated by thick septations. On MRI, many of these loculations demonstrate fluid-filled levels. No normal ovarian parenchyma is identified. The left ovary was not seen. There is no pelvic free fluid.  She subsequently underwent an EMB with her gynecologist and this was negative.   She apparently was not aware that she had a cyst this size until she was seen by her provider who recommended Gyn Oncology consultation for further evaluation. She was seen by Dr. Carlton Adam who recommended surgery and further imaging and tumor markers.   02/05/2018: CA 125 = 18, CA 19-9 = 8, CEA <0.5, OVA1 = 4 (low risk)  02/09/2018 CT Scan A/P: multi-septated complex cystic lesion noted in the right pelvis.adnexa immediately superior to the uterus which measures approximately 6.9 x 5.3 am in greatest axial dimensions and approximately 5.4 cm in greatest cranial caudal dimensions.  Abnormal thickening of  the endometrial cavity which measures 1.3 cm in greatest width.  Multiple peripelvic cysts are identified in bilateral renal hilar regions measuring up to 3.6 cm on the right and 2.9 cm on the left.  On 02/15/18, she underwent TLH, BSO, with bilateral pelvic LN dissection, peritoneal biopsies, and omental biopsy with Dr. Unknown Foley at [facility]. No spread  beyond tube and ovaries.  She was discharged on POD1 w/o complication.    Pathology A. 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  See the synoptic report. Note: The tumor is positive for PAX8, WT1, and p53 (strong, uniform). Based on the presence of tubal intraepithelial carcinoma, this is considered a tubal primary.  B.  Endometrium, curettage: Fragments of endometrial polyp.  C. Uterus, total hysterectomy:   Uterus:    Endometrium: Endometrial polyp with focal glandular crowding, see note.    Myometrium: Adenomyosis, leiomyomata (up to 1 cm).    Cervix: No pathologic diagnosis.     Serosa: No pathologic diagnosis.  Note: The areas of endometrial glandular crowding are not cytologically distinct from the background and therefore do not meet the criteria for endometrial intraepithelial neoplasia (EIN).  D. Right pelvic lymph nodes, lymphadenectomy: Three lymph nodes, negative for malignancy (0/3).   E. Left pelvic lymph nodes, lymphadenectomy: Three lymph nodes, negative for malignancy (0/3).   F. Omentum, omentectomy: Benign omentum.  G. Peritoneal biopsy #1: Benign fibromuscular tissue.  H. Peritoneal biopsy #2: Benign fibrous tissue.  I. Peritoneal biopsy #3: Benign fibroadipose tissue.  J. Peritoneal biopsy #4: Benign fibroadipose tissue.  A. Pelvic Washings: - Negative. No Evidence of Malignancy  03/21/18. She was referred to Dr. Janese Banks, medical-oncology at [facility] and initiated cycle 1 of adjuvant carbo-Taxol on 03/21/18.   05/28/2018 Patient received cycle 4 of chemotherapy on . After 4 cycles of chemotherapy patient developed progressive weakness in her bilateral lower extremities and was admitted to the hospital in [place] where she developed worsening respiratory failure and  had to be intubated was airlifted to [place].  Patient was eventually extubated.  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  She was given IVIG as well.  Of note she has received flu shot consistently in the past and has never had these complications ever. Patient was then sent to a neuro rehab unit and was eventually discharged home.   08/16/2018 - CT C/A/P IMPRESSION: 1. Status post hysterectomy and bilateral salpingo oophorectomy. No findings for metastatic ovarian cancer. No peritoneal surface or omental disease or abdominal/pelvic lymphadenopathy. 2. Mild chronic scarring changes in the lungs but no findings worrisome for pulmonary metastatic disease.  She has had her covid vaccines without complication.   Imaging was 06/01/20 which was negative for metastatic disease.   03/04/2021 CT C/A/P IMPRESSION: 1. Stable CTs of the chest, abdomen and pelvis status post hysterectomy. No evidence of local recurrence or metastatic disease. 2. Stable pulmonary scarring and chronic central airway thickening. 3. Stable incidental findings including renal sinus parapelvic cysts, probable pelvic floor laxity and mild Aortic Atherosclerosis (ICD10-I70.0).  08/16/2021 IMPRESSION: 1. Bilateral parapelvic renal cysts. 2. Severe right hip degenerative changes. 3.  Aortic Atherosclerosis (ICD10-I70.0).  10/19/2020 RECOMMENDATION: Screening mammogram in one year.(Code:SM-B-01Y)  BI-RADS CATEGORY  1: Negative.  CA 125 (not  elevated at time of diagnosis) 07/13/2021 18.2 01/05/21 16.4 09/08/2020 16.8 06/02/20 18.6 02/11/20 16 10/09/2018 16.9 02/05/2018 18 (diagnosis)    Other medical issues She has a h/o prior breast mass and she requested we order her mammogram done on 10/19/2020  IMPRESSION: No evidence of malignancy in the bilateral breasts. RECOMMENDATION:  Screening mammogram in one year.(Code:SM-B-01Y)  BI-RADS CATEGORY  1: Negative.  Genetic Testing:  - [employer] 83 gene Multi-Cancer panel was negative for pathogenic mutation in any genes. No VUS detected.   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   Chemotherapy-induced neuropathy ([place]) 05/14/2018   Vitamin D deficiency 05/08/2018   Neuropathy 05/08/2018   Overactive bladder 05/08/2018   Bilateral leg edema 05/08/2018   HLD (hyperlipidemia) 05/08/2018   Goals of care, counseling/discussion 02/25/2018   Ovarian cancer ([place]) 02/22/2018   Graves' disease 02/13/2018    Past Medical History: Past Medical History:  Diagnosis Date   Aortic atherosclerosis ([place])    Arthritis    right shoulder   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    04/2021 mohs   Cancer ([place]) 2019   b/l ovaries and left fallopian tube pelvic lymph nodes negative    Guillain Barr syndrome ([place])    2/2 flu shot 05/2018,  intubated x 8 days   Hyperlipidemia    Lymphedema    Overactive bladder    Thyroid disease    UTI (urinary tract infection)    Vitamin D deficiency     Past Surgical History: Past Surgical History:  Procedure Laterality Date   ABDOMINAL HYSTERECTOMY     02/15/18    CHOLECYSTECTOMY  2008   LAPAROSCOPIC VAGINAL HYSTERECTOMY WITH SALPINGO OOPHORECTOMY  02/15/2018   PORTACATH PLACEMENT Left 02/25/2018   Procedure: INSERTION PORT-A-CATH;  Surgeon: Robert Bellow, MD;  Location: [facility] ORS;  Service: General;  Laterality: Left;   TOTAL ABDOMINAL HYSTERECTOMY W/ BILATERAL  SALPINGOOPHORECTOMY  02/15/2018   lymph nodes removed    OB History:  OB History  Gravida Para Term Preterm AB Living  2         2  SAB IAB Ectopic Multiple Live Births               # Outcome Date GA Lbr Len/2nd Weight Sex Delivery Anes PTL Lv  2 Gravida           1 Gravida             Obstetric Comments  Menstrual age: 59    Age 1st Pregnancy: 24    Family History: Family History  Problem Relation Age of Onset   Sudden death Mother 48       drowning in hurricane Katrina   Pancreatic cancer Father        deceased early 18s; smoker   Alcohol abuse Father    COPD Father    Coronary artery disease Brother    Melanoma Sister        deceased at 21   COPD Sister    Prostate cancer Maternal Uncle        deceased 52   Breast cancer Paternal  Aunt        unsure of this dx    Social History: Social History   Socioeconomic History   Marital status: Widowed    Spouse name: Not on file   Number of children: Not on file   Years of education: Not on file   Highest education level: Not on file  Occupational History   Not on file  Tobacco Use   Smoking status: Former    Packs/day: 1.00    Years: 25.00    Pack years: 25.00    Types: Cigarettes    Quit date: 02/26/1984    Years since quitting: 37.6   Smokeless tobacco: Never   Tobacco comments:    age 10 to 34 1 ppd   Vaping Use   Vaping Use: Never used  Substance and Sexual Activity   Alcohol use: Never   Drug use: Never   Sexual activity: Not Currently  Other Topics Concern   Not on file  Social History Narrative   Engineer, maintenance (IT), bachelors degree    Lives in [place] here visiting daughter [name]    Former smoker quit age 29 y.o    Social Determinants of Health   Financial Resource Strain: Low Risk    Difficulty of Paying Living Expenses: Not hard at all  Food Insecurity: No Food Insecurity   Worried About Charity fundraiser in the Last Year: Never true   Arboriculturist in the Last Year: Never true   Transportation Needs: No Transportation Needs   Lack of Transportation (Medical): No   Lack of Transportation (Non-Medical): No  Physical Activity: Not on file  Stress: No Stress Concern Present   Feeling of Stress : Not at all  Social Connections: Not on file  Intimate Partner Violence: Not At Risk   Fear of Current or Ex-Partner: No   Emotionally Abused: No   Physically Abused: No   Sexually Abused: No   Immunization History  Administered Date(s) Administered   Influenza, High Dose Seasonal PF 05/31/2018   Influenza-Unspecified 05/13/2018   PFIZER Comirnaty(Gray Top)Covid-19 Tri-Sucrose Vaccine 11/15/2020   PFIZER(Purple Top)SARS-COV-2 Vaccination 02/09/2020, 03/02/2020   Allergies: Allergies  Allergen Reactions   Novocain [Procaine] Palpitations   Crestor [Rosuvastatin]     Myopathy     Influenza Vaccines     ?GBS 05/2018    Lipitor [Atorvastatin Calcium]     Myalgias      Review of Systems General: no complaints  HEENT: no complaints  Lungs: no complaints  Cardiac: no complaints  GI: no complaints  GU: no complaints  Musculoskeletal: no complaints  Extremities: no complaints  Skin: no complaints  Neuro: no complaints  Endocrine: no complaints  Psych: no complaints         Objective:  Physical Examination: BP 139/69 (BP Location: Right Arm, Patient Position: Sitting)    Pulse 77    Resp 18    Wt 191 lb (86.6 kg)    SpO2 98%    BMI 30.83 kg/m    ECOG Performance Status: 1 - Symptomatic but completely ambulatory  GENERAL: Patient is a well appearing female in no acute distress HEENT:  Atraumatic and normocephalic. PERRL, neck supple. NODES:  No cervical, supraclavicular, axillary, or inguinal lymphadenopathy palpated.  LUNGS:  Clear to auscultation bilaterally.  No wheezes. HEART:  Regular rate and rhythm.  ABDOMEN:  Soft, nontender. Nondistended. No masses/ascites/hernia/or hepatomegaly.  EXTREMITIES:  bilateral lymphedema.   SKIN:  Clear with no  obvious rashes or  skin changes. No nail dyscrasia. NEURO:  Nonfocal. Well oriented.  Appropriate affect.  Pelvic:chaperoned by CMA EGBUS: no lesions Cervix: surgically absent Vagina: no lesions, no discharge or bleeding Uterus: surgically absent BME: no palpable masses Rectovaginal: deferred      Assessment:  SALENE MOHAMUD is a 81 y.o. 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 Unknown Foley. She has initiated chemotherapy 03/21/2018 with carbo-Taxol and is treated by Dr. Janese Banks at [facility]. Chemotherapy discontinued after 05/28/2018 due to GBS from flu shot. Clinically NED. Radiographically NED in July 2022. CA 125, not elevated at diagnosis but monitored is pending at time of visit.  Peripheral neuropathy.   Symptomatic bilateral lymphedema, possibly secondary to surgery.   She has completed germline genetic testing which was negative for mutation.   Hip Pain- secondary to severe degenerative changes  Urinary incontinence  Medical co-morbidities complicating care: BMI of 29.41 Plan:   1. Malignant neoplasm of right ovary (HCC)  CA125 pending today. Clinically she is asymptomatic. We discussed surveillance and I offered imaging every 12 months given CA125 is not a marker for her versus imaging if she is symptomattic. She would like to have annual imaging.   Continue to plan for physical exam including pelvic exam in 4-6 months then every 6 months for until five years, then annually thereafter.  Somatic testing was not recommended at this time because she does not have a BRCA mutation and is not a candidate for maintenance PARP inhibitor after completing primary chemotherapy.  Somatic testing for mutation and HRD can be considered should she have recurrence.   Lymphedema- She had been seeing Dr. Delana Meyer for this using lymph pump, compression stockings.  But she has not been as compliant with therapy and her swelling is worse. We discussed referral to Dr. Shara Blazing and she is interested. I spoke with Dr. Shara Blazing and she will try to facilitate the appointment.   Hip pain- she requested advise on how to see for hip replacement. We provided Dr. Clydell Hakim information as he is local as well as Dr. Okey Dupre at [facility].   Urinary incontinence - Provided Dr. Tommas Olp information for Urogynecologic referral.   Beckey Rutter, [place], AGNP-C [place] at [facility] [phone] (clinic)  I personally had a face to face interaction and evaluated the patient jointly with the NP, Ms. Beckey Rutter.  I have reviewed her history and available records and have performed the key portions of the physical exam including lymph node survey, abdominal exam, pelvic exam with my findings confirming those documented above by the APP.  I have discussed the case with the APP and the patient.  I agree with the above documentation, assessment and plan which was fully formulated by me.  Counseling was completed by me.   I personally saw the patient and performed a substantive portion of this encounter in conjunction with the listed APP as documented above.  Najia Hurlbutt Gaetana Michaelis, MD

## 2021-10-12 NOTE — Patient Instructions (Signed)
Tyrone Apple, MD [place] at [facility] for [place], [place] [address] [phone]

## 2021-10-13 ENCOUNTER — Telehealth: Payer: Self-pay

## 2021-10-13 DIAGNOSIS — M25561 Pain in right knee: Secondary | ICD-10-CM | POA: Diagnosis not present

## 2021-10-13 DIAGNOSIS — M25552 Pain in left hip: Secondary | ICD-10-CM | POA: Diagnosis not present

## 2021-10-13 DIAGNOSIS — M25551 Pain in right hip: Secondary | ICD-10-CM | POA: Diagnosis not present

## 2021-10-13 DIAGNOSIS — M25562 Pain in left knee: Secondary | ICD-10-CM | POA: Diagnosis not present

## 2021-10-13 LAB — CA 125: Cancer Antigen (CA) 125: 16.9 U/mL (ref 0.0–38.1)

## 2021-10-13 NOTE — Telephone Encounter (Signed)
-----   Message from Verlon Au, NP sent at 10/13/2021  8:51 AM EST ----- Please let patient know her ca 125 is still nice and low. She does not regularly utilize her mychart I don't believe. Thanks!   ----- Message ----- From: Buel Ream, Lab In [place] Sent: 10/13/2021   5:37 AM EST To: Verlon Au, NP

## 2021-10-17 ENCOUNTER — Other Ambulatory Visit (HOSPITAL_COMMUNITY): Payer: Self-pay

## 2021-10-17 ENCOUNTER — Telehealth: Payer: Self-pay

## 2021-10-17 DIAGNOSIS — M25551 Pain in right hip: Secondary | ICD-10-CM | POA: Diagnosis not present

## 2021-10-17 DIAGNOSIS — M25552 Pain in left hip: Secondary | ICD-10-CM | POA: Diagnosis not present

## 2021-10-17 DIAGNOSIS — M25561 Pain in right knee: Secondary | ICD-10-CM | POA: Diagnosis not present

## 2021-10-17 DIAGNOSIS — M25562 Pain in left knee: Secondary | ICD-10-CM | POA: Diagnosis not present

## 2021-10-18 ENCOUNTER — Telehealth: Payer: Self-pay | Admitting: Internal Medicine

## 2021-10-18 NOTE — Telephone Encounter (Signed)
Jessica from [facility] called, they sent over last week orders for pt. They need the pt orders signed so the can continue physical therapy.

## 2021-10-19 NOTE — Telephone Encounter (Signed)
Orders have been received and signed. Will fax over first thing tomorrow morning.

## 2021-10-19 NOTE — Telephone Encounter (Signed)
Error

## 2021-10-21 ENCOUNTER — Ambulatory Visit (INDEPENDENT_AMBULATORY_CARE_PROVIDER_SITE_OTHER): Payer: Medicare Other

## 2021-10-21 VITALS — Ht 66.0 in | Wt 191.0 lb

## 2021-10-21 DIAGNOSIS — Z0000 Encounter for general adult medical examination without abnormal findings: Secondary | ICD-10-CM

## 2021-10-21 NOTE — Progress Notes (Signed)
Subjective:   Melissa Anderson is a 81 y.o. female who presents for Medicare Annual (Subsequent) preventive examination.  Review of Systems    No ROS.  Medicare Wellness Virtual Visit.  Visual/audio telehealth visit, UTA vital signs.   See social history for additional risk factors.   Cardiac Risk Factors include: advanced age (>13men, >21 women)     Objective:    Today's Vitals   10/21/21 1034  Weight: 191 lb (86.6 kg)  Height: 5\' 6"  (1.676 m)   Body mass index is 30.83 kg/m.  Advanced Directives 10/21/2021 10/12/2021 07/13/2021 01/05/2021 10/20/2020 06/02/2020 02/11/2020  Does Patient Have a Medical Advance Directive? Yes Yes Yes Yes Yes Yes Yes  Type of Advance Directive - [place];Living will - [place];Living will [place];Living will [place];Living will [place];Living will  Does patient want to make changes to medical advance directive? - No - Patient declined Yes (ED - Information included in AVS) Yes (ED - Information included in AVS) No - Patient declined - -  Copy of [place] in Chart? Yes - validated most recent copy scanned in chart (See row information) Yes - validated most recent copy scanned in chart (See row information) - - Yes - validated most recent copy scanned in chart (See row information) - -  Would patient like information on creating a medical advance directive? - No - Patient declined Yes (ED - Information included in AVS) Yes (ED - Information included in AVS) - - -    Current Medications (verified) Outpatient Encounter Medications as of 10/21/2021  Medication Sig   aspirin EC 81 MG tablet Take 81 mg by mouth daily.   finasteride (PROSCAR) 5 MG tablet Take 5 mg by mouth daily.   Fluticasone-Umeclidin-Vilant (TRELEGY ELLIPTA) 100-62.5-25 MCG/ACT AEPB Inhale 1 puff into the lungs daily.   levalbuterol (XOPENEX HFA) 45 MCG/ACT inhaler  Inhale 2 puffs into the lungs every 6 (six) hours as needed for wheezing or shortness of breath. (Patient not taking: Reported on 10/12/2021)   SYNTHROID 150 MCG tablet Take 1 tablet (150 mcg total) by mouth daily before breakfast. 30 min before food d/c 175 mcg   Vibegron (GEMTESA) 75 MG TABS Take 75 mg by mouth daily.   Vitamin D, Ergocalciferol, (DRISDOL) 1.25 MG (50000 UNIT) CAPS capsule Take 1 capsule (50,000 Units total) by mouth once a week. D3   No facility-administered encounter medications on file as of 10/21/2021.    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    Guillain Barr syndrome ([place])    2/2 flu shot 05/2018,  intubated x 8 days   Hyperlipidemia    Lymphedema    Overactive bladder    Thyroid disease    UTI (urinary tract infection)    Vitamin D deficiency    Past Surgical History:  Procedure Laterality Date   ABDOMINAL HYSTERECTOMY     02/15/18    CHOLECYSTECTOMY  2008   LAPAROSCOPIC VAGINAL HYSTERECTOMY WITH SALPINGO OOPHORECTOMY  02/15/2018   PORTACATH PLACEMENT Left 02/25/2018   Procedure: INSERTION PORT-A-CATH;  Surgeon: Hervey Ard  W, MD;  Location: [facility] ORS;  Service: General;  Laterality: Left;   TOTAL ABDOMINAL HYSTERECTOMY W/ BILATERAL SALPINGOOPHORECTOMY  02/15/2018   lymph nodes removed   Family History  Problem Relation Age of Onset   Sudden death Mother 36       drowning in hurricane [place]rina   Pancreatic cancer Father        deceased early 27s; smoker   Alcohol abuse Father    COPD Father    Coronary artery disease Brother    Melanoma Sister        deceased at 38   COPD Sister    Prostate cancer  Maternal Uncle        deceased 71   Breast cancer Paternal Aunt        unsure of this dx   Social History   Socioeconomic History   Marital status: Widowed    Spouse name: Not on file   Number of children: Not on file   Years of education: Not on file   Highest education level: Not on file  Occupational History   Not on file  Tobacco Use   Smoking status: Former    Packs/day: 1.00    Years: 25.00    Pack years: 25.00    Types: Cigarettes    Quit date: 02/26/1984    Years since quitting: 37.6   Smokeless tobacco: Never   Tobacco comments:    age 24 to 70 1 ppd   Vaping Use   Vaping Use: Never used  Substance and Sexual Activity   Alcohol use: Never   Drug use: Never   Sexual activity: Not Currently  Other Topics Concern   Not on file  Social History Narrative   Engineer, maintenance (IT), bachelors degree    Lives in [place] here visiting daughter [name]    Former smoker quit age 7 y.o    Social Determinants of Health   Financial Resource Strain: Low Risk    Difficulty of Paying Living Expenses: Not hard at all  Food Insecurity: No Food Insecurity   Worried About Charity fundraiser in the Last Year: Never true   Arboriculturist in the Last Year: Never true  Transportation Needs: No Transportation Needs   Lack of Transportation (Medical): No   Lack of Transportation (Non-Medical): No  Physical Activity: Insufficiently Active   Days of Exercise per Week: 2 days   Minutes of Exercise per Session: 60 min  Stress: No Stress Concern Present   Feeling of Stress : Not at all  Social Connections: Unknown   Frequency of Communication with Friends and Family: More than three times a week   Frequency of Social Gatherings with Friends and Family: Not on file   Attends Religious Services: Not on Electrical engineer or Organizations: Not on file   Attends Archivist Meetings: Not on file   Marital Status: Not on file    Tobacco Counseling Counseling given: Not  Answered Tobacco comments: age 69 to 89 1 ppd    Clinical Intake:  Pre-visit preparation completed: Yes        Diabetes: No  How often do you need to have someone help you when you read instructions, pamphlets, or other written materials from your doctor or pharmacy?: 1 - Never    Interpreter Needed?: No      Activities of Daily Living In your present state of health, do you have any difficulty performing the following activities:  10/21/2021  Hearing? Y  Comment Hearing aids  Vision? N  Difficulty concentrating or making decisions? N  Walking or climbing stairs? Y  Comment Paces self. Walker recommended.  Dressing or bathing? N  Doing errands, shopping? N  Preparing Food and eating ? N  Using the Toilet? N  In the past six months, have you accidently leaked urine? Y  Comment Followed by Urologist  Do you have problems with loss of bowel control? N  Managing your Medications? N  Managing your Finances? N  Housekeeping or managing your Housekeeping? N  Some recent data might be hidden    Patient Care Team: McLean-Scocuzza, Nino Glow, MD as PCP - General (Internal Medicine) Gillis Ends, MD as Referring Physician (Obstetrics and Gynecology) Bary Castilla Forest Gleason, MD as Surgeon (General Surgery) Sindy Guadeloupe, MD as Consulting Physician (Oncology) Clent Jacks, RN as Oncology Nurse Navigator  Indicate any recent Medical Services you may have received from other than [facility] providers in the past year (date may be approximate).     Assessment:   This is a routine wellness examination for [place].  Virtual Visit via Telephone Note  I connected with  Melissa Anderson on 10/21/21 at 10:30 AM EST by telephone and verified that I am speaking with the correct person using two identifiers.  Persons participating in the virtual visit: patient/Nurse Health Advisor   I discussed the limitations, risks, security and privacy concerns of performing an evaluation  and management service by telephone and the availability of in person appointments. The patient expressed understanding and agreed to proceed.  Interactive audio and video telecommunications were attempted between this nurse and patient, however failed, due to patient having technical difficulties OR patient did not have access to video capability.  We continued and completed visit with audio only.  Some vital signs may be absent or patient reported.   Hearing/Vision screen Hearing Screening - Comments:: Hearing aids  Does not wear as recommended.   Dietary issues and exercise activities discussed: Current Exercise Habits: Structured exercise class, Time (Minutes): 60 (Physical therapy), Frequency (Times/Week): 2, Weekly Exercise (Minutes/Week): 120, Intensity: Mild   Goals Addressed               This Visit's Progress     Patient Stated     I would like to increase water intake (pt-stated)   On track      Depression Screen PHQ 2/9 Scores 10/21/2021 10/20/2020 03/09/2020 05/31/2018  PHQ - 2 Score 0 0 0 0  PHQ- 9 Score - - 0 -    Fall Risk Fall Risk  10/21/2021 10/04/2021 01/05/2021 10/20/2020 09/28/2020  Falls in the past year? 0 0 0 0 0  Number falls in past yr: 0 0 0 0 0  Injury with Fall? - 0 0 0 0  Risk for fall due to : - No Fall Risks - Impaired balance/gait Impaired balance/gait  Follow up Falls evaluation completed Falls evaluation completed Falls evaluation completed Falls evaluation completed Falls evaluation completed    [place]: Home free of loose throw rugs in walkways, pet beds, electrical cords, etc? Yes  Adequate lighting in your home to reduce risk of falls? Yes   ASSISTIVE DEVICES UTILIZED TO PREVENT FALLS: Life alert? No  Use of a cane, walker or w/c? No . Walker recommended but does not use.    TIMED UP AND GO: Was the test performed? No .   Cognitive Function:  Patient  is alert and oriented x3.    6CIT Screen  05/31/2018  What Year? 0 points  What month? 0 points  What time? 0 points  Count back from 20 0 points  Months in reverse 0 points  Repeat phrase 0 points  Total Score 0    Immunizations Immunization History  Administered Date(s) Administered   Influenza, High Dose Seasonal PF 05/31/2018   Influenza-Unspecified 05/13/2018   PFIZER Comirnaty(Gray Top)Covid-19 Tri-Sucrose Vaccine 11/15/2020   PFIZER(Purple Top)SARS-COV-2 Vaccination 02/09/2020, 03/02/2020    Screening Tests Health Maintenance  Topic Date Due   INFLUENZA VACCINE  11/25/2021 (Originally 03/28/2021)   COVID-19 Vaccine (4 - Booster for Pfizer series) 07/28/2022 (Originally 01/10/2021)   Pneumonia Vaccine 79+ Years old (1 - PCV) 10/06/2022 (Originally 05/30/2005)   DEXA SCAN  Completed   HPV VACCINES  Aged Out   TETANUS/TDAP  Discontinued   Zoster Vaccines- Shingrix  Discontinued   Health Maintenance There are no preventive care reminders to display for this patient.  Mammogram- scheduled.   Lung Cancer Screening: (Low Dose CT Chest recommended if Age 48-80 years, 30 pack-year currently smoking OR have quit w/in 15years.) does not qualify.   Hepatitis C Screening: does not qualify.  Vision Screening: Recommended annual ophthalmology exams for early detection of glaucoma and other disorders of the eye.  Dental Screening: Recommended annual dental exams for proper oral hygiene  Community Resource Referral / Chronic Care Management: CRR required this visit?  No   CCM required this visit?  No      Plan:   Keep all routine maintenance appointments.   I have personally reviewed and noted the following in the patients chart:   Medical and social history Use of alcohol, tobacco or illicit drugs  Current medications and supplements including opioid prescriptions.  Functional ability and status Nutritional status Physical activity Advanced directives List of other physicians Hospitalizations, surgeries, and  ER visits in previous 12 months Vitals Screenings to include cognitive, depression, and falls Referrals and appointments  In addition, I have reviewed and discussed with patient certain preventive protocols, quality metrics, and best practice recommendations. A written personalized care plan for preventive services as well as general preventive health recommendations were provided to patient.     Varney Biles, LPN   [id number]

## 2021-10-21 NOTE — Patient Instructions (Addendum)
Melissa Anderson , Thank you for taking time to come for your Medicare Wellness Visit. I appreciate your ongoing commitment to your health goals. Please review the following plan we discussed and let me know if I can assist you in the future.   These are the goals we discussed:  Goals       Patient Stated     I would like to increase water intake (pt-stated)        This is a list of the screening recommended for you and due dates:  Health Maintenance  Topic Date Due   Flu Shot  11/25/2021*   COVID-19 Vaccine (4 - Booster for Pfizer series) 07/28/2022*   Pneumonia Vaccine (1 - PCV) 10/06/2022*   DEXA scan (bone density measurement)  Completed   HPV Vaccine  Aged Out   Tetanus Vaccine  Discontinued   Zoster (Shingles) Vaccine  Discontinued  *Topic was postponed. The date shown is not the original due date.    Advanced directives: on file  Conditions/risks identified: none new  Follow up in one year for your annual wellness visit    Preventive Care 65 Years and Older, Female Preventive care refers to lifestyle choices and visits with your health care provider that can promote health and wellness. What does preventive care include? A yearly physical exam. This is also called an annual well check. Dental exams once or twice a year. Routine eye exams. Ask your health care provider how often you should have your eyes checked. Personal lifestyle choices, including: Daily care of your teeth and gums. Regular physical activity. Eating a healthy diet. Avoiding tobacco and drug use. Limiting alcohol use. Practicing safe sex. Taking low-dose aspirin every day. Taking vitamin and mineral supplements as recommended by your health care provider. What happens during an annual well check? The services and screenings done by your health care provider during your annual well check will depend on your age, overall health, lifestyle risk factors, and family history of disease. Counseling   Your health care provider may ask you questions about your: Alcohol use. Tobacco use. Drug use. Emotional well-being. Home and relationship well-being. Sexual activity. Eating habits. History of falls. Memory and ability to understand (cognition). Work and work Statistician. Reproductive health. Screening  You may have the following tests or measurements: Height, weight, and BMI. Blood pressure. Lipid and cholesterol levels. These may be checked every 5 years, or more frequently if you are over 16 years old. Skin check. Lung cancer screening. You may have this screening every year starting at age 84 if you have a 30-pack-year history of smoking and currently smoke or have quit within the past 15 years. Fecal occult blood test (FOBT) of the stool. You may have this test every year starting at age 29. Flexible sigmoidoscopy or colonoscopy. You may have a sigmoidoscopy every 5 years or a colonoscopy every 10 years starting at age 32. Hepatitis C blood test. Hepatitis B blood test. Sexually transmitted disease (STD) testing. Diabetes screening. This is done by checking your blood sugar (glucose) after you have not eaten for a while (fasting). You may have this done every 1-3 years. Bone density scan. This is done to screen for osteoporosis. You may have this done starting at age 73. Mammogram. This may be done every 1-2 years. Talk to your health care provider about how often you should have regular mammograms. Talk with your health care provider about your test results, treatment options, and if necessary, the need for more  tests. Vaccines  Your health care provider may recommend certain vaccines, such as: Influenza vaccine. This is recommended every year. Tetanus, diphtheria, and acellular pertussis (Tdap, Td) vaccine. You may need a Td booster every 10 years. Zoster vaccine. You may need this after age 37. Pneumococcal 13-valent conjugate (PCV13) vaccine. One dose is recommended  after age 77. Pneumococcal polysaccharide (PPSV23) vaccine. One dose is recommended after age 46. Talk to your health care provider about which screenings and vaccines you need and how often you need them. This information is not intended to replace advice given to you by your health care provider. Make sure you discuss any questions you have with your health care provider. Document Released: 09/10/2015 Document Revised: 05/03/2016 Document Reviewed: 06/15/2015 Elsevier Interactive Patient Education  2017 [place] Prevention in the Home Falls can cause injuries. They can happen to people of all ages. There are many things you can do to make your home safe and to help prevent falls. What can I do on the outside of my home? Regularly fix the edges of walkways and driveways and fix any cracks. Remove anything that might make you trip as you walk through a door, such as a raised step or threshold. Trim any bushes or trees on the path to your home. Use bright outdoor lighting. Clear any walking paths of anything that might make someone trip, such as rocks or tools. Regularly check to see if handrails are loose or broken. Make sure that both sides of any steps have handrails. Any raised decks and porches should have guardrails on the edges. Have any leaves, snow, or ice cleared regularly. Use sand or salt on walking paths during winter. Clean up any spills in your garage right away. This includes oil or grease spills. What can I do in the bathroom? Use night lights. Install grab bars by the toilet and in the tub and shower. Do not use towel bars as grab bars. Use non-skid mats or decals in the tub or shower. If you need to sit down in the shower, use a plastic, non-slip stool. Keep the floor dry. Clean up any water that spills on the floor as soon as it happens. Remove soap buildup in the tub or shower regularly. Attach bath mats securely with double-sided non-slip rug tape. Do not  have throw rugs and other things on the floor that can make you trip. What can I do in the bedroom? Use night lights. Make sure that you have a light by your bed that is easy to reach. Do not use any sheets or blankets that are too big for your bed. They should not hang down onto the floor. Have a firm chair that has side arms. You can use this for support while you get dressed. Do not have throw rugs and other things on the floor that can make you trip. What can I do in the kitchen? Clean up any spills right away. Avoid walking on wet floors. Keep items that you use a lot in easy-to-reach places. If you need to reach something above you, use a strong step stool that has a grab bar. Keep electrical cords out of the way. Do not use floor polish or wax that makes floors slippery. If you must use wax, use non-skid floor wax. Do not have throw rugs and other things on the floor that can make you trip. What can I do with my stairs? Do not leave any items on the stairs. Make sure  that there are handrails on both sides of the stairs and use them. Fix handrails that are broken or loose. Make sure that handrails are as long as the stairways. Check any carpeting to make sure that it is firmly attached to the stairs. Fix any carpet that is loose or worn. Avoid having throw rugs at the top or bottom of the stairs. If you do have throw rugs, attach them to the floor with carpet tape. Make sure that you have a light switch at the top of the stairs and the bottom of the stairs. If you do not have them, ask someone to add them for you. What else can I do to help prevent falls? Wear shoes that: Do not have high heels. Have rubber bottoms. Are comfortable and fit you well. Are closed at the toe. Do not wear sandals. If you use a stepladder: Make sure that it is fully opened. Do not climb a closed stepladder. Make sure that both sides of the stepladder are locked into place. Ask someone to hold it for  you, if possible. Clearly mark and make sure that you can see: Any grab bars or handrails. First and last steps. Where the edge of each step is. Use tools that help you move around (mobility aids) if they are needed. These include: Canes. Walkers. Scooters. Crutches. Turn on the lights when you go into a dark area. Replace any light bulbs as soon as they burn out. Set up your furniture so you have a clear path. Avoid moving your furniture around. If any of your floors are uneven, fix them. If there are any pets around you, be aware of where they are. Review your medicines with your doctor. Some medicines can make you feel dizzy. This can increase your chance of falling. Ask your doctor what other things that you can do to help prevent falls. This information is not intended to replace advice given to you by your health care provider. Make sure you discuss any questions you have with your health care provider. Document Released: 06/10/2009 Document Revised: 01/20/2016 Document Reviewed: 09/18/2014 Elsevier Interactive Patient Education  2017 [employer].

## 2021-10-27 ENCOUNTER — Ambulatory Visit
Admission: RE | Admit: 2021-10-27 | Discharge: 2021-10-27 | Disposition: A | Discharge status: Home / Self Care | Payer: Medicare Other | Source: Ambulatory Visit | Attending: Obstetrics and Gynecology | Admitting: Obstetrics and Gynecology

## 2021-10-27 ENCOUNTER — Other Ambulatory Visit: Payer: Self-pay

## 2021-10-27 DIAGNOSIS — Z1231 Encounter for screening mammogram for malignant neoplasm of breast: Secondary | ICD-10-CM | POA: Insufficient documentation

## 2021-10-28 DIAGNOSIS — M25562 Pain in left knee: Secondary | ICD-10-CM | POA: Diagnosis not present

## 2021-10-28 DIAGNOSIS — M25551 Pain in right hip: Secondary | ICD-10-CM | POA: Diagnosis not present

## 2021-10-28 DIAGNOSIS — M25561 Pain in right knee: Secondary | ICD-10-CM | POA: Diagnosis not present

## 2021-10-28 DIAGNOSIS — M25552 Pain in left hip: Secondary | ICD-10-CM | POA: Diagnosis not present

## 2021-10-31 ENCOUNTER — Other Ambulatory Visit: Payer: Self-pay

## 2021-10-31 ENCOUNTER — Ambulatory Visit (INDEPENDENT_AMBULATORY_CARE_PROVIDER_SITE_OTHER): Payer: Medicare Other

## 2021-10-31 DIAGNOSIS — R0602 Shortness of breath: Secondary | ICD-10-CM | POA: Diagnosis not present

## 2021-10-31 LAB — ECHOCARDIOGRAM COMPLETE
AR max vel: 2.75 cm2
AV Area VTI: 3.08 cm2
AV Area mean vel: 2.69 cm2
AV Mean grad: 3 mmHg
AV Peak grad: 5.9 mmHg
Ao pk vel: 1.21 m/s
Area-P 1/2: 4.63 cm2
Calc EF: 69.8 %
S' Lateral: 3.1 cm
Single Plane A2C EF: 62.9 %
Single Plane A4C EF: 76 %

## 2021-11-01 ENCOUNTER — Ambulatory Visit: Payer: Medicare Other | Admitting: Urology

## 2021-11-02 ENCOUNTER — Telehealth: Payer: Self-pay | Admitting: Pulmonary Disease

## 2021-11-02 NOTE — Telephone Encounter (Signed)
Her echocardiogram shows that the right side of her heart is pumping well.  There is however indication that there is higher pressures on the right side of her heart and I would like to refer her to cardiology because she may need further evaluation for this.  This may explain some of her shortness of breath. ?  ?Recommend referral to cardiology for consideration of right heart catheterization to evaluate for potential pulmonary hypertension. ? ? ?Spoke to patient and relayed above results. She voiced her understanding.  ?She would like to call back with preferred cardiologist after speaking with her daughter.  ?Will wait call back.  ?

## 2021-11-06 NOTE — Progress Notes (Incomplete)
11/06/21 5:34 PM   Melissa Anderson 1941-02-29 [id number]  Referring provider:  McLean-Scocuzza, Nino Glow, MD [place],  [place] [address] No chief complaint on file.    HPI: Melissa Anderson is a 81 y.o.female with a personal history of OAB, hematuria, rUTIs, and bladder cancer, who presents today for a follow-up.   She was last seen in by Dr Matilde Sprang on 07/04/2021 for urinary incontinence. She has failed vesicare, Gemtesa, and oxybutynin.   She is being managed on Myrbetriq.       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)    2/2 flu shot 05/2018,  intubated x 8 days   Hyperlipidemia    Lymphedema    Overactive bladder    Thyroid disease    UTI (urinary tract infection)    Vitamin D deficiency     Surgical History: Past Surgical History:  Procedure Laterality Date   ABDOMINAL HYSTERECTOMY     02/15/18    CHOLECYSTECTOMY  2008   LAPAROSCOPIC VAGINAL HYSTERECTOMY WITH SALPINGO OOPHORECTOMY  02/15/2018   PORTACATH PLACEMENT Left 02/25/2018   Procedure: INSERTION PORT-A-CATH;  Surgeon: Robert Bellow, MD;  Location: [facility] ORS;  Service: General;  Laterality: Left;   TOTAL ABDOMINAL HYSTERECTOMY W/ BILATERAL SALPINGOOPHORECTOMY  02/15/2018   lymph nodes removed    Home Medications:  Allergies as of 11/08/2021       Reactions   Novocain [procaine] Palpitations   Crestor [rosuvastatin]    Myopathy    Influenza Vaccines    ?GBS 05/2018   Lipitor [atorvastatin Calcium]    Myalgias         Medication List        Accurate as of November 06, 2021  5:34 PM. If you have any questions, ask your nurse or doctor.          aspirin EC 81 MG tablet Take 81 mg by mouth daily.   finasteride 5 MG  tablet Commonly known as: PROSCAR Take 5 mg by mouth daily.   Gemtesa 75 MG Tabs Generic drug: Vibegron Take 75 mg by mouth daily.   levalbuterol 45 MCG/ACT inhaler Commonly known as: Xopenex HFA Inhale 2 puffs into the lungs every 6 (six) hours as needed for wheezing or shortness of breath.   Synthroid 150 MCG tablet Generic drug: levothyroxine Take 1 tablet (150 mcg total) by mouth daily before breakfast. 30 min before food d/c 175 mcg   Trelegy Ellipta 100-62.5-25 MCG/ACT Aepb Generic drug: Fluticasone-Umeclidin-Vilant Inhale 1 puff into the lungs daily.   Vitamin D (Ergocalciferol) 1.25 MG (50000 UNIT) Caps capsule Commonly known as: DRISDOL Take 1 capsule (50,000 Units total) by mouth once a week. D3        Allergies:  Allergies  Allergen Reactions   Novocain [Procaine] Palpitations   Crestor [Rosuvastatin]     Myopathy     Influenza Vaccines     ?GBS 05/2018    Lipitor [Atorvastatin Calcium]     Myalgias      Family History: Family History  Problem Relation Age of Onset   Sudden death Mother 34       drowning in hurricane Katrina   Pancreatic cancer Father  deceased early 53s; smoker   Alcohol abuse Father    COPD Father    Coronary artery disease Brother    Melanoma Sister        deceased at 31   COPD Sister    Prostate cancer Maternal Uncle        deceased 40   Breast cancer Paternal Aunt        unsure of this dx    Social History:  reports that she quit smoking about 37 years ago. Her smoking use included cigarettes. She has a 25.00 pack-year smoking history. She has never used smokeless tobacco. She reports that she does not drink alcohol and does not use drugs.   Physical Exam: There were no vitals taken for this visit.  Constitutional:  Alert and oriented, No acute distress. HEENT: [place] AT, moist mucus membranes.  Trachea midline, no masses. Cardiovascular: No clubbing, cyanosis, or edema. Respiratory: Normal respiratory effort, no  increased work of breathing. Skin: No rashes, bruises or suspicious lesions. Neurologic: Grossly intact, no focal deficits, moving all 4 extremities. Psychiatric: Normal mood and affect.  Laboratory Data: Lab Results  Component Value Date   CREATININE 0.70 08/16/2021   Lab Results  Component Value Date   HGBA1C 5.3 05/09/2018    Urinalysis   Pertinent Imaging:   Assessment & Plan:     No follow-ups on file.  I,Kailey Littlejohn,acting as a Education administrator for Hollice Espy, MD.,have documented all relevant documentation on the behalf of Hollice Espy, MD,as directed by  Hollice Espy, MD while in the presence of Hollice Espy, [place] [address], [place],  [address] [phone]

## 2021-11-08 ENCOUNTER — Other Ambulatory Visit: Payer: Self-pay

## 2021-11-08 ENCOUNTER — Encounter: Payer: Self-pay | Admitting: Urology

## 2021-11-08 ENCOUNTER — Ambulatory Visit (INDEPENDENT_AMBULATORY_CARE_PROVIDER_SITE_OTHER): Payer: Medicare Other | Admitting: Urology

## 2021-11-08 VITALS — BP 142/76 | HR 68 | Ht 66.0 in | Wt 191.0 lb

## 2021-11-08 DIAGNOSIS — N3281 Overactive bladder: Secondary | ICD-10-CM | POA: Diagnosis not present

## 2021-11-08 NOTE — Patient Instructions (Signed)
Cranberry tablets as directed. Probiotics as directed. D-Mannose as directed      Cystoscopy Cystoscopy is a procedure that is used to help diagnose and sometimes treat conditions that affect the lower urinary tract. The lower urinary tract includes the bladder and the urethra. The urethra is the tube that drains urine from the bladder. Cystoscopy is done using a thin, tube-shaped instrument with a light and camera at the end (cystoscope). The cystoscope may be hard or flexible, depending on the goal of the procedure. The cystoscope is inserted through the urethra, into the bladder. Cystoscopy may be recommended if you have: Urinary tract infections that keep coming back. Blood in the urine (hematuria). An inability to control when you urinate (urinary incontinence) or an overactive bladder. Unusual cells found in a urine sample. A blockage in the urethra, such as a urinary stone. Painful urination. An abnormality in the bladder found during an intravenous pyelogram (IVP) or CT scan. What are the risks? Generally, this is a safe procedure. However, problems may occur, including: Infection. Bleeding.  What happens during the procedure?  You will be given one or more of the following: A medicine to numb the area (local anesthetic). The area around the opening of your urethra will be cleaned. The cystoscope will be passed through your urethra into your bladder. Germ-free (sterile) fluid will flow through the cystoscope to fill your bladder. The fluid will stretch your bladder so that your health care provider can clearly examine your bladder walls. Your doctor will look at the urethra and bladder. The cystoscope will be removed The procedure may vary among health care providers  What can I expect after the procedure? After the procedure, it is common to have: Some soreness or pain in your urethra. Urinary symptoms. These include: Mild pain or burning when you urinate. Pain should stop  within a few minutes after you urinate. This may last for up to a few days after the procedure. A small amount of blood in your urine for several days. Feeling like you need to urinate but producing only a small amount of urine. Follow these instructions at home: General instructions Return to your normal activities as told by your health care provider.  Drink plenty of fluids after the procedure. Keep all follow-up visits as told by your health care provider. This is important. Contact a health care provider if you: Have pain that gets worse or does not get better with medicine, especially pain when you urinate lasting longer than 72 hours after the procedure. Have trouble urinating. Get help right away if you: Have blood clots in your urine. Have a fever or chills. Are unable to urinate. Summary Cystoscopy is a procedure that is used to help diagnose and sometimes treat conditions that affect the lower urinary tract. Cystoscopy is done using a thin, tube-shaped instrument with a light and camera at the end. After the procedure, it is common to have some soreness or pain in your urethra. It is normal to have blood in your urine after the procedure.  If you were prescribed an antibiotic medicine, take it as told by your health care provider.  This information is not intended to replace advice given to you by your health care provider. Make sure you discuss any questions you have with your health care provider. Document Revised: 08/06/2018 Document Reviewed: 08/06/2018 Elsevier Patient Education  2020 Elsevier Inc.  

## 2021-11-10 LAB — URINALYSIS, COMPLETE
Bilirubin, UA: NEGATIVE
Glucose, UA: NEGATIVE
Nitrite, UA: NEGATIVE
Protein,UA: NEGATIVE
Specific Gravity, UA: 1.025 (ref 1.005–1.030)
Urobilinogen, Ur: 1 mg/dL (ref 0.2–1.0)
pH, UA: 6.5 (ref 5.0–7.5)

## 2021-11-10 LAB — MICROSCOPIC EXAMINATION

## 2021-11-14 ENCOUNTER — Telehealth: Payer: Self-pay | Admitting: Pulmonary Disease

## 2021-11-14 DIAGNOSIS — R0602 Shortness of breath: Secondary | ICD-10-CM

## 2021-11-14 MED ORDER — LEVALBUTEROL TARTRATE 45 MCG/ACT IN AERO: 2.0000 | INHALATION_SPRAY | Freq: Four times a day (QID) | RESPIRATORY_TRACT | 3 refills | Status: DC | PRN

## 2021-11-14 NOTE — Telephone Encounter (Signed)
Patient called back and stated that she would like to be referred to Dr. Saunders Revel.  ?Order placed. ?Nothing further needed.  ? ?

## 2021-11-14 NOTE — Telephone Encounter (Signed)
Please refer to 11/02/2021 phone note.  ? ?Patient stated that she would like to discuss cardiology with daughter, she will call back with name. At that time referral will be placed. ?She also stated that Xopenex was not received by pharmacy. Rx sent.  ? ?

## 2021-11-14 NOTE — Addendum Note (Signed)
Addended by: Claudette Head A on: 11/14/2021 09:31 AM ? ? Modules accepted: Orders ? ?

## 2021-11-17 DIAGNOSIS — Z20822 Contact with and (suspected) exposure to covid-19: Secondary | ICD-10-CM | POA: Diagnosis not present

## 2021-11-18 ENCOUNTER — Ambulatory Visit (INDEPENDENT_AMBULATORY_CARE_PROVIDER_SITE_OTHER): Payer: Medicare Other | Admitting: Internal Medicine

## 2021-11-18 ENCOUNTER — Other Ambulatory Visit: Payer: Self-pay

## 2021-11-18 ENCOUNTER — Encounter: Payer: Self-pay | Admitting: Internal Medicine

## 2021-11-18 VITALS — BP 120/60 | HR 84 | Temp 98.2°F | Resp 14 | Ht 66.0 in | Wt 191.2 lb

## 2021-11-18 DIAGNOSIS — R0602 Shortness of breath: Secondary | ICD-10-CM | POA: Diagnosis not present

## 2021-11-18 DIAGNOSIS — E039 Hypothyroidism, unspecified: Secondary | ICD-10-CM | POA: Diagnosis not present

## 2021-11-18 DIAGNOSIS — R5383 Other fatigue: Secondary | ICD-10-CM | POA: Diagnosis not present

## 2021-11-18 DIAGNOSIS — Z1231 Encounter for screening mammogram for malignant neoplasm of breast: Secondary | ICD-10-CM | POA: Diagnosis not present

## 2021-11-18 DIAGNOSIS — M160 Bilateral primary osteoarthritis of hip: Secondary | ICD-10-CM | POA: Diagnosis not present

## 2021-11-18 DIAGNOSIS — R635 Abnormal weight gain: Secondary | ICD-10-CM

## 2021-11-18 NOTE — Progress Notes (Signed)
Chief Complaint  ?Patient presents with  ? Follow-up  ?  4 months, discuss about having a handicapped form filled out, also thyroid medications pt states dose is too low for her which is causing her to gain wt. PT did not help but has been doing acupuncture which has helped and is regaining feeling in her nerves.   ? ?F/u  ?1. Pt wants handicap plate has the placards for the car  ? ?2. Right Hip pain better had 3/9 accupuncture treatments which Dr. Raul Del which improved lymphedema, neuropathy s/p chemo and s/p GBS, urine incontinence, hip pain better though she still has leg pain  ?3. Hypothyroidism she was on levo 175 now 150 mcg qd since tsh was low normal 0.94 but she feels like she is gaining weight back since dose was changed ? ?4. Sob with exertion increase pressures right side of heart and pending referral cardiology Dr. Saunders Revel to consider cardiac catherization to further w/u per pulm Dr. Patsey Berthold  ? ? ?Review of Systems  ?Constitutional:  Negative for weight loss.  ?HENT:  Negative for hearing loss.   ?Eyes:  Negative for blurred vision.  ?Respiratory:  Positive for shortness of breath.   ?Cardiovascular:  Negative for chest pain.  ?Gastrointestinal:  Negative for abdominal pain and blood in stool.  ?Genitourinary:  Negative for dysuria.  ?Musculoskeletal:  Positive for joint pain. Negative for falls.  ?Skin:  Negative for rash.  ?Neurological:  Negative for headaches.  ?Psychiatric/Behavioral:  Negative for depression.   ?Past Medical History:  ?Diagnosis Date  ? Aortic atherosclerosis ([place])   ? Arthritis   ? right shoulder  ? 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 04/2021 mohs  ? Cancer ([place]) 2019  ? b/l ovaries and left fallopian tube pelvic lymph nodes negative   ? Guillain Barr? syndrome ([place])   ? 2/2 flu shot 05/2018,  intubated x 8 days  ? Hyperlipidemia   ? Lymphedema   ? Overactive bladder   ? Thyroid disease   ? UTI (urinary tract infection)   ? Vitamin D deficiency   ? ?Past Surgical  History:  ?Procedure Laterality Date  ? ABDOMINAL HYSTERECTOMY    ? 02/15/18   ? CHOLECYSTECTOMY  2008  ? LAPAROSCOPIC VAGINAL HYSTERECTOMY WITH SALPINGO OOPHORECTOMY  02/15/2018  ? PORTACATH PLACEMENT Left 02/25/2018  ? Procedure: INSERTION PORT-A-CATH;  Surgeon: Robert Bellow, MD;  Location: [facility] ORS;  Service: General;  Laterality: Left;  ? TOTAL ABDOMINAL HYSTERECTOMY W/ BILATERAL SALPINGOOPHORECTOMY  02/15/2018  ? lymph nodes removed  ? ?Family History  ?Problem Relation Age of Onset  ? Sudden death Mother 60  ?     drowning in hurricane Katrina  ? Pancreatic cancer Father   ?     deceased early 46s; smoker  ? Alcohol abuse Father   ? COPD Father   ? Coronary artery disease Brother   ? Melanoma Sister   ?     deceased at 38  ? COPD Sister   ? Prostate cancer Maternal Uncle   ?     deceased 59  ? Breast cancer Paternal Aunt   ?     unsure of this dx  ? ?Social History  ? ?Socioeconomic History  ? Marital status: Widowed  ?  Spouse name: Not on file  ? Number of children: Not on file  ? Years of education: Not on file  ? Highest education level: Not on file  ?Occupational History  ? Not  on file  ?Tobacco Use  ? Smoking status: Former  ?  Packs/day: 1.00  ?  Years: 25.00  ?  Pack years: 25.00  ?  Types: Cigarettes  ?  Quit date: 02/26/1984  ?  Years since quitting: 37.7  ? Smokeless tobacco: Never  ? Tobacco comments:  ?  age 88 to 61 1 ppd   ?Vaping Use  ? Vaping Use: Never used  ?Substance and Sexual Activity  ? Alcohol use: Never  ? Drug use: Never  ? Sexual activity: Not Currently  ?Other Topics Concern  ? Not on file  ?Social History Narrative  ? CPA, bachelors degree   ? Lives in [place] here visiting daughter [name]   ? Former smoker quit age 66 y.o   ? ?Social Determinants of Health  ? ?Financial Resource Strain: Low Risk   ? Difficulty of Paying Living Expenses: Not hard at all  ?Food Insecurity: No Food Insecurity  ? Worried About Charity fundraiser in the Last Year: Never true  ? Ran Out of Food  in the Last Year: Never true  ?Transportation Needs: No Transportation Needs  ? Lack of Transportation (Medical): No  ? Lack of Transportation (Non-Medical): No  ?Physical Activity: Insufficiently Active  ? Days of Exercise per Week: 2 days  ? Minutes of Exercise per Session: 60 min  ?Stress: No Stress Concern Present  ? Feeling of Stress : Not at all  ?Social Connections: Unknown  ? Frequency of Communication with Friends and Family: More than three times a week  ? Frequency of Social Gatherings with Friends and Family: Not on file  ? Attends Religious Services: Not on file  ? Active Member of Clubs or Organizations: Not on file  ? Attends Archivist Meetings: Not on file  ? Marital Status: Not on file  ?Intimate Partner Violence: Not At Risk  ? Fear of Current or Ex-Partner: No  ? Emotionally Abused: No  ? Physically Abused: No  ? Sexually Abused: No  ? ?Current Meds  ?Medication Sig  ? aspirin EC 81 MG tablet Take 81 mg by mouth daily.  ? finasteride (PROSCAR) 5 MG tablet Take 5 mg by mouth daily.  ? Fluticasone-Umeclidin-Vilant (TRELEGY ELLIPTA) 100-62.5-25 MCG/ACT AEPB Inhale 1 puff into the lungs daily.  ? SYNTHROID 150 MCG tablet Take 1 tablet (150 mcg total) by mouth daily before breakfast. 30 min before food d/c 175 mcg  ? Vibegron (GEMTESA) 75 MG TABS Take 75 mg by mouth daily.  ? Vitamin D, Ergocalciferol, (DRISDOL) 1.25 MG (50000 UNIT) CAPS capsule Take 1 capsule (50,000 Units total) by mouth once a week. D3  ? ?Allergies  ?Allergen Reactions  ? Novocain [Procaine] Palpitations  ? Crestor [Rosuvastatin]   ?  Myopathy  ?  ? Influenza Vaccines   ?  ?GBS 05/2018 ?  ? Lipitor [Atorvastatin Calcium]   ?  Myalgias  ?  ? ?Recent Results (from the past 2160 hour(s))  ?Urinalysis, Routine w reflex microscopic     Status: Abnormal  ? Collection Time: 10/04/21 12:39 PM  ?Result Value Ref Range  ? Color, Urine YELLOW YELLOW  ? APPearance CLOUDY (A) CLEAR  ? Specific Gravity, Urine 1.021 1.001 - 1.035  ? pH  < OR = 5.0 5.0 - 8.0  ? Glucose, UA NEGATIVE NEGATIVE  ? Bilirubin Urine NEGATIVE NEGATIVE  ? Ketones, ur NEGATIVE NEGATIVE  ? Hgb urine dipstick NEGATIVE NEGATIVE  ? Protein, ur NEGATIVE NEGATIVE  ? Nitrite NEGATIVE  NEGATIVE  ? Leukocytes,Ua 2+ (A) NEGATIVE  ? WBC, UA 20-40 (A) 0 - 5 /HPF  ? RBC / HPF 10-20 (A) 0 - 2 /HPF  ? Squamous Epithelial / LPF 10-20 (A) < OR = 5 /HPF  ? Bacteria, UA MANY (A) NONE SEEN /HPF  ? Calcium Oxalate Crystal MANY (A) NONE OR FEW /HPF  ? Hyaline Cast NONE SEEN NONE SEEN /LPF  ?Urine Culture     Status: Abnormal  ? Collection Time: 10/04/21 12:39 PM  ? Specimen: Urine  ?Result Value Ref Range  ? MICRO NUMBER: [id number]   ? SPECIMEN QUALITY: Adequate   ? Sample Source NOT GIVEN   ? STATUS: FINAL   ? ISOLATE 1: Escherichia coli (A)   ?  Comment: Greater than 100,000 CFU/mL of Escherichia coli  ?    Susceptibility  ? Escherichia coli - URINE CULTURE, REFLEX  ?  AMOX/CLAVULANIC <=2 Sensitive   ?  AMPICILLIN <=2 Sensitive   ?  AMPICILLIN/SULBACTAM <=2 Sensitive   ?  CEFAZOLIN* <=4 Not Reportable   ?   * For infections other than uncomplicated UTI ?caused by E. coli, K. pneumoniae or P. mirabilis: ?Cefazolin is resistant if MIC > or = 8 mcg/mL. ?(Distinguishing susceptible versus intermediate ?for isolates with MIC < or = 4 mcg/mL requires ?additional testing.) ?For uncomplicated UTI caused by E. coli, ?K. pneumoniae or P. mirabilis: Cefazolin is ?susceptible if MIC <32 mcg/mL and predicts ?susceptible to the oral agents cefaclor, cefdinir, ?cefpodoxime, cefprozil, cefuroxime, cephalexin ?and loracarbef. ?  ?  CEFTAZIDIME <=1 Sensitive   ?  CEFEPIME <=1 Sensitive   ?  CEFTRIAXONE <=1 Sensitive   ?  CIPROFLOXACIN <=0.25 Sensitive   ?  LEVOFLOXACIN <=0.12 Sensitive   ?  GENTAMICIN <=1 Sensitive   ?  IMIPENEM <=0.25 Sensitive   ?  NITROFURANTOIN 32 Sensitive   ?  PIP/TAZO <=4 Sensitive   ?  TOBRAMYCIN <=1 Sensitive   ?  TRIMETH/SULFA* <=20 Sensitive   ?   * For infections other than  uncomplicated UTI ?caused by E. coli, K. pneumoniae or P. mirabilis: ?Cefazolin is resistant if MIC > or = 8 mcg/mL. ?(Distinguishing susceptible versus intermediate ?for isolates with MIC < or = 4 mcg/mL requires ?add

## 2021-11-18 NOTE — Patient Instructions (Signed)
cranberry tablets, probiotics, yogurt that has active lactobacillus culture, and d-mannose for frequent UTI  ? ?Consider cystoscopy  ? ? ?

## 2021-11-19 LAB — CBC WITH DIFFERENTIAL/PLATELET
Absolute Monocytes: 607 cells/uL (ref 200–950)
Basophils Absolute: 73 cells/uL (ref 0–200)
Basophils Relative: 1.1 %
Eosinophils Absolute: 198 cells/uL (ref 15–500)
Eosinophils Relative: 3 %
HCT: 41.2 % (ref 35.0–45.0)
Hemoglobin: 13.3 g/dL (ref 11.7–15.5)
Lymphs Abs: 1307 cells/uL (ref 850–3900)
MCH: 30.4 pg (ref 27.0–33.0)
MCHC: 32.3 g/dL (ref 32.0–36.0)
MCV: 94.3 fL (ref 80.0–100.0)
MPV: 10.9 fL (ref 7.5–12.5)
Monocytes Relative: 9.2 %
Neutro Abs: 4415 cells/uL (ref 1500–7800)
Neutrophils Relative %: 66.9 %
Platelets: 311 10*3/uL (ref 140–400)
RBC: 4.37 10*6/uL (ref 3.80–5.10)
RDW: 12.7 % (ref 11.0–15.0)
Total Lymphocyte: 19.8 %
WBC: 6.6 10*3/uL (ref 3.8–10.8)

## 2021-11-19 LAB — COMPREHENSIVE METABOLIC PANEL
AG Ratio: 2 (calc) (ref 1.0–2.5)
ALT: 14 U/L (ref 6–29)
AST: 14 U/L (ref 10–35)
Albumin: 4.3 g/dL (ref 3.6–5.1)
Alkaline phosphatase (APISO): 76 U/L (ref 37–153)
BUN/Creatinine Ratio: 35 (calc) — ABNORMAL HIGH (ref 6–22)
BUN: 27 mg/dL — ABNORMAL HIGH (ref 7–25)
CO2: 27 mmol/L (ref 20–32)
Calcium: 9.9 mg/dL (ref 8.6–10.4)
Chloride: 106 mmol/L (ref 98–110)
Creat: 0.77 mg/dL (ref 0.60–0.95)
Globulin: 2.2 g/dL (calc) (ref 1.9–3.7)
Glucose, Bld: 98 mg/dL (ref 65–99)
Potassium: 4.6 mmol/L (ref 3.5–5.3)
Sodium: 144 mmol/L (ref 135–146)
Total Bilirubin: 0.5 mg/dL (ref 0.2–1.2)
Total Protein: 6.5 g/dL (ref 6.1–8.1)

## 2021-11-19 LAB — TSH: TSH: 2.46 mIU/L (ref 0.40–4.50)

## 2021-11-21 ENCOUNTER — Other Ambulatory Visit: Payer: Self-pay | Admitting: Internal Medicine

## 2021-11-21 ENCOUNTER — Other Ambulatory Visit: Payer: Self-pay | Admitting: Family

## 2021-11-21 ENCOUNTER — Other Ambulatory Visit (HOSPITAL_COMMUNITY): Payer: Self-pay

## 2021-11-21 ENCOUNTER — Telehealth: Payer: Self-pay | Admitting: Internal Medicine

## 2021-11-21 DIAGNOSIS — E559 Vitamin D deficiency, unspecified: Secondary | ICD-10-CM

## 2021-11-21 DIAGNOSIS — E039 Hypothyroidism, unspecified: Secondary | ICD-10-CM

## 2021-11-21 NOTE — Telephone Encounter (Signed)
Patient normally takes SYNTHROID 150 MCG tablet and she picked up at the pharmacy SYNTHROID 175 MCG tablet. She would like to know which one she is taking 150 MCG or 175 MCG. ?

## 2021-11-23 ENCOUNTER — Telehealth: Payer: Self-pay

## 2021-11-23 NOTE — Telephone Encounter (Signed)
error 

## 2021-11-23 NOTE — Telephone Encounter (Signed)
Left message to return call. ? ?Thyroid lab still in normal range on 150 mcg daily  ?If she feels she is gaining weight she can take 150 mcg qd (1 pill) daily except 1.5 mcg daily on Sunday ?-what will she do?  ?  ?Blood cts normal  ?  ?  ?BUN 1 of kidney function #s slightly elevated by 2 points  ?Avoid nsaids I.e advil/anti-inflammatory drugs  ?Want to make sure she drinks at least 50-55 ounces of water daily  ?Written by Delorise Jackson, MD on 11/21/2021  8:34 AM EDT ?

## 2021-11-28 ENCOUNTER — Other Ambulatory Visit (HOSPITAL_COMMUNITY): Payer: Self-pay

## 2021-11-28 NOTE — Telephone Encounter (Signed)
Patient Advocate Encounter ? ?Prior Authorization for Levalbuterol Tartrate has been approved.   ? ?PA# N/A ? ?Effective dates: 08/28/21 through 11/24/22 ? ?Per Test Claim Patients co-pay is $5.57.  ? ?Spoke with Pharmacy to Process. ? ?Patient Advocate ?Fax: [phone]  ?

## 2021-11-30 ENCOUNTER — Other Ambulatory Visit: Payer: Self-pay | Admitting: Internal Medicine

## 2021-11-30 DIAGNOSIS — E039 Hypothyroidism, unspecified: Secondary | ICD-10-CM

## 2021-11-30 MED ORDER — SYNTHROID 150 MCG PO TABS: 150.0000 ug | ORAL_TABLET | Freq: Every day | ORAL | 3 refills | Status: DC

## 2021-11-30 NOTE — Telephone Encounter (Signed)
Pt called in stating that she normally take medication (SYNTHROID 150 MCG tablet) and she went to pharmacy to pick up medication... Pt stated that she received medication (SYNTHROID 175 MCG tablet)... Pt would like to know what medication should she be taking... Pt requesting callback  ?

## 2021-11-30 NOTE — Telephone Encounter (Signed)
Patien tis aware to take 150 mcg daily except Sunday she can take1.5 tablets for weight gain. ?

## 2021-12-06 ENCOUNTER — Other Ambulatory Visit: Payer: 59 | Admitting: Urology

## 2021-12-09 ENCOUNTER — Ambulatory Visit (INDEPENDENT_AMBULATORY_CARE_PROVIDER_SITE_OTHER): Payer: Medicare Other | Admitting: Pulmonary Disease

## 2021-12-09 ENCOUNTER — Encounter: Payer: Self-pay | Admitting: Pulmonary Disease

## 2021-12-09 VITALS — BP 114/50 | HR 82 | Temp 97.7°F | Ht 66.0 in | Wt 188.0 lb

## 2021-12-09 DIAGNOSIS — J449 Chronic obstructive pulmonary disease, unspecified: Secondary | ICD-10-CM

## 2021-12-09 DIAGNOSIS — R0602 Shortness of breath: Secondary | ICD-10-CM

## 2021-12-09 DIAGNOSIS — I5189 Other ill-defined heart diseases: Secondary | ICD-10-CM

## 2021-12-09 NOTE — Progress Notes (Signed)
Subjective:    Patient ID: Melissa Anderson, female    DOB: 10/30/1940, 81 y.o.   MRN: [id number] Patient Care Team: McLean-Scocuzza, Pasty Spillers, MD as PCP - General (Internal Medicine) Artelia Laroche, MD as Referring Physician (Obstetrics and Gynecology) Lemar Livings Merrily Pew, MD as Surgeon (General Surgery) Creig Hines, MD as Consulting Physician (Oncology) Benita Gutter, RN as Oncology Nurse Navigator  Chief Complaint  Patient presents with   Follow-up    Wants her opinion on if she is physically fit to have hip surgery.  Getting acupuncture.  Incident recently with about 3 tablespoons of red pepper flakes.  The top came off while she was adding it to her food.   HPI Melissa Anderson is an 81 year old former smoker with a 25-pack-year history of smoking and a history as noted below who presents for follow-up on the issue of shortness of breath.  I last saw Melissa Anderson on 06 October 2021 at that time she was given trial of Trelegy Ellipta and instructed to use albuterol as needed.  She appeared to have issues with asthmatic bronchitis and was poorly compensated at that time.  She states that Trelegy has helped her dyspnea significantly.  He has not had any wheezing.  No cough or sputum production.  She has been investigating exercise programs to help her with increase stamina.  She has been having some issues with a hip that may require replacement and she inquires whether she is fit to have this done.   Recall that she was initially seen by Dr. Santiago Glad on 30 March 2021 with a complaint of shortness of breath.  Melissa Anderson has had a very complex medical history with prior Guillain-Barr syndrome and ovarian cancer status post TLH and BSO bilateral pelvic lymphadenectomy and omental peritoneal biopsies at [facility] in 2019 followed by 4 cycles of CarboTaxol chemotherapy between July 2019 and October 2019.  Since then she has been deemed to be cancer free.  A CT chest of October 2021 showed that she had  some bronchiectasis.   Of note she had an echocardiogram on 31 October 2021 that showed LVEF of 60 to 65%, no wall motion abnormalities, mild mitral regurgitation.  Increase in right atrial pressures.  She has been referred to cardiology.   DATA 04/12/2021 PFTs: FEV1 1.13 L or 52% predicted, FVC 1.94 L or 67% predicted, FEV1/FVC 58%, no bronchodilator study was performed, lung volumes were normal.  Flow volume loop was consistent with obstruction.  Consistent with moderate obstructive defect. 10/31/2021 echocardiogram: LVEF 60 to 65%, no wall motion abnormalities, mild mitral regurgitation, increase in right atrial pressures.  No overt diastolic dysfunction.   Review of Systems A 10 point review of systems was performed and it is as noted above otherwise negative.  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   History of ovarian cancer 02/11/2020   Acute pain of right shoulder 08/15/2018   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   Chemotherapy-induced neuropathy (HCC) 05/14/2018   Vitamin D deficiency 05/08/2018   Neuropathy 05/08/2018   Overactive bladder 05/08/2018   Bilateral leg edema 05/08/2018   HLD (hyperlipidemia) 05/08/2018   Goals of care, counseling/discussion 02/25/2018   Ovarian cancer (HCC) 02/22/2018   Graves' disease 02/13/2018   Social History  Tobacco Use   Smoking status: Former    Packs/day: 1.00    Years: 25.00    Pack years: 25.00    Types: Cigarettes    Quit date: 02/26/1984    Years since quitting: 37.8   Smokeless tobacco: Never   Tobacco comments:    age 78 to 59 1 ppd   Substance Use Topics   Alcohol use: Never   Allergies  Allergen Reactions   Novocain [Procaine]  Palpitations   Crestor [Rosuvastatin]     Myopathy     Influenza Vaccines     ?GBS 05/2018    Lipitor [Atorvastatin Calcium]     Myalgias     Current Meds  Medication Sig   aspirin EC 81 MG tablet Take 81 mg by mouth daily.   finasteride (PROSCAR) 5 MG tablet Take 5 mg by mouth daily.   Fluticasone-Umeclidin-Vilant (TRELEGY ELLIPTA) 100-62.5-25 MCG/ACT AEPB Inhale 1 puff into the lungs daily.   levalbuterol (XOPENEX HFA) 45 MCG/ACT inhaler Inhale 2 puffs into the lungs every 6 (six) hours as needed for wheezing or shortness of breath.   SYNTHROID 150 MCG tablet Take 1 tablet (150 mcg total) by mouth daily before breakfast. 30 min before food. You can take 150 mcg qd on Sundays 1.5 tablets d/c 175 mcg   Vibegron (GEMTESA) 75 MG TABS Take 75 mg by mouth daily.   Vitamin D, Ergocalciferol, (DRISDOL) 1.25 MG (50000 UNIT) CAPS capsule TAKE 1 CAPSULE (50,000 UNITS TOTAL) BY MOUTH ONCE A WEEK. D3   Immunization History  Administered Date(s) Administered   Influenza, High Dose Seasonal PF 05/31/2018   Influenza-Unspecified 05/13/2018   PFIZER Comirnaty(Gray Top)Covid-19 Tri-Sucrose Vaccine 11/15/2020   PFIZER(Purple Top)SARS-COV-2 Vaccination 02/09/2020, 03/02/2020       Objective:   Physical Exam BP (!) 114/50 (BP Location: Right Arm, Patient Position: Sitting, Cuff Size: Large)   Pulse 82   Temp 97.7 F (36.5 C) (Oral)   Ht  (1.676 m)   Wt 188 lb (85.3 kg)   SpO2 98%   BMI 30.34 kg/m   SpO2: 98 % O2 Device: None (Room air)  GENERAL: Well-developed, well-nourished woman who looks age appropriate.  No acute distress, fully ambulatory somewhat anxious. HEAD: Normocephalic, atraumatic.  EYES: Pupils equal, round, reactive to light.  No scleral icterus.  MOUTH: Dentition intact. NECK: Supple. No thyromegaly. Trachea midline. No JVD.  No adenopathy. PULMONARY: Good air entry bilaterally.  Coarse breath sounds otherwise, no adventitious sounds. CARDIOVASCULAR: S1 and S2.  Regular rate and rhythm.  No rubs, murmurs or gallops heard. ABDOMEN: Benign. MUSCULOSKELETAL: No joint deformity, no clubbing, trace to 1+ edema of the lower extremities.  NEUROLOGIC: No overt focal deficit, no gait disturbance, speech is fluent. SKIN: Intact,warm,dry. PSYCH: Mood and behavior normal      Assessment & Plan:     ICD-10-CM   1. Asthmatic bronchitis , chronic (HCC) - moderate  J44.9    Well compensated on Trelegy Continue Trelegy and as needed albuterol Follow-up 3 months time call sooner should any new problems arise    2. SOB (shortness of breath)  R06.02    Markedly improved on Trelegy Likely related to asthmatic bronchitis    3. Grade I diastolic dysfunction  I51.89    Most recent echocardiogram shows normal diastolic parameters     Patient will be seen in follow-up in 3 months time call sooner should any new problems arise.  She appears well compensated at present.  She is going to engage  in fitness program which I suspect will help her tremendously with her issues of deconditioning.  Gailen Shelter, MD Advanced Bronchoscopy PCCM [facility]    *This note was dictated using voice recognition software/Dragon.  Despite best efforts to proofread, errors can occur which can change the meaning. Any transcriptional errors that result from this process are unintentional and may not be fully corrected at the time of dictation.

## 2021-12-09 NOTE — Patient Instructions (Signed)
Continue Trelegy. ? ?Continue as needed albuterol. ? ?I am glad that you are pursuing an exercise program this will help you with your stamina and your breathing. ? ?We will see you in follow-up in 3 months time call sooner should any new problems arise. ?

## 2021-12-11 ENCOUNTER — Other Ambulatory Visit: Payer: Self-pay | Admitting: Internal Medicine

## 2021-12-11 DIAGNOSIS — E039 Hypothyroidism, unspecified: Secondary | ICD-10-CM

## 2021-12-11 MED ORDER — SYNTHROID 150 MCG PO TABS: 150.0000 ug | ORAL_TABLET | Freq: Every day | ORAL | 3 refills | Status: DC

## 2021-12-19 DIAGNOSIS — Z20822 Contact with and (suspected) exposure to covid-19: Secondary | ICD-10-CM | POA: Diagnosis not present

## 2021-12-29 ENCOUNTER — Ambulatory Visit (INDEPENDENT_AMBULATORY_CARE_PROVIDER_SITE_OTHER): Payer: Medicare Other | Admitting: Internal Medicine

## 2021-12-29 ENCOUNTER — Encounter: Payer: Self-pay | Admitting: Internal Medicine

## 2021-12-29 VITALS — BP 116/60 | HR 73 | Ht 66.0 in | Wt 192.0 lb

## 2021-12-29 DIAGNOSIS — R0609 Other forms of dyspnea: Secondary | ICD-10-CM | POA: Diagnosis not present

## 2021-12-29 DIAGNOSIS — I5032 Chronic diastolic (congestive) heart failure: Secondary | ICD-10-CM

## 2021-12-29 DIAGNOSIS — R0602 Shortness of breath: Secondary | ICD-10-CM

## 2021-12-29 MED ORDER — FUROSEMIDE 20 MG PO TABS: 20.0000 mg | ORAL_TABLET | Freq: Every day | ORAL | 1 refills | Status: DC

## 2021-12-29 NOTE — Patient Instructions (Signed)
Medication Instructions:  ? ?Your physician has recommended you make the following change in your medication:  ? ?START Lasix (furosemide) 20 mg daily  ? ?*If you need a refill on your cardiac medications before your next appointment, please call your pharmacy* ? ? ?Lab Work: ? ?None ordered ? ?Testing/Procedures: ? ?None ordered ? ? ?Follow-Up: ?At [facility], you and your health needs are our priority.  As part of our continuing mission to provide you with exceptional heart care, we have created designated Provider Care Teams.  These Care Teams include your primary Cardiologist (physician) and Advanced Practice Providers (APPs -  Physician Assistants and Nurse Practitioners) who all work together to provide you with the care you need, when you need it. ? ?We recommend signing up for the patient portal called "MyChart".  Sign up information is provided on this After Visit Summary.  MyChart is used to connect with patients for Virtual Visits (Telemedicine).  Patients are able to view lab/test results, encounter notes, upcoming appointments, etc.  Non-urgent messages can be sent to your provider as well.   ?To learn more about what you can do with MyChart, go to [email or web address].   ? ?Your next appointment:   ?1 month(s) ? ?The format for your next appointment:   ?In Person ? ?Provider:   ?You may see Dr. Harrell Gave End or one of the following Advanced Practice Providers on your designated Care Team:   ?Murray Hodgkins, NP ?Christell Faith, PA-C ?Cadence Kathlen Mody, PA-C{ ? ? ?Important Information About Sugar ? ? ? ? ? ? ?

## 2021-12-29 NOTE — Progress Notes (Signed)
? ?New Outpatient Visit ?Date: 12/29/2021 ? ?Referring Provider: ?Tyler Pita, MD ?[address] ?[place],  [place] [address] ? ?Chief Complaint: Shortness of breath ? ?HPI:  Melissa Anderson is a 81 y.o. female who is being seen today for the evaluation of shortness of breath at the request of Dr. Patsey Berthold. She has a history of aortic atherosclerosis, hyperlipidemia, asthmatic bronchitis, ovarian cancer, Guillain-Barr? syndrome, lymphedema, and hypothyroidism.  Today, Melissa Anderson reports that she is feeling fairly well though she notes shortness of breath when walking relatively short distances.  She attributes this to pain in her right hip that has limited her activity.  She is trying to avoid surgery if at all possible.  She plans to start with water therapy soon.  She did not notice any improvement with PT.  She has not had any chest pain, palpitations, lightheadedness, or orthopnea.  She has chronic bilateral pretibial edema that has been stable for years.  She does not believe that she has undergone any prior heart testing other than the echocardiogram ordered by Dr. Patsey Berthold in March.  This was interpreted as normal LVEF and diastolic function.  CVP was noted to be elevated. ? ?-------------------------------------------------------------------------------------------------- ? ?Cardiovascular History & Procedures: ?Cardiovascular Problems: ?Dyspnea on exertion ? ?Risk Factors: ?Aortic atherosclerosis, hyperlipidemia, obesity, and age greater than 74 ? ?Cath/PCI: ?None ? ?CV Surgery: ?None ? ?EP Procedures and Devices: ?None ? ?Non-Invasive Evaluation(s): ?TTE (10/31/2021): Normal LV size and wall thickness.  LVEF 60-65% with normal diastolic function.  Normal RV size and function.  Normal biatrial size.  Mild mitral regurgitation.  Trivial tricuspid regurgitation.  Elevated CVP (~15 mmHg). ? ?Recent CV Pertinent Labs: ?Lab Results  ?Component Value Date  ? CHOL 204 (H) 07/15/2021  ? HDL 61.10  07/15/2021  ? LDLCALC 123 (H) 07/15/2021  ? LDLCALC 109 (H) 05/09/2018  ? TRIG 99.0 07/15/2021  ? CHOLHDL 3 07/15/2021  ? K 4.6 11/18/2021  ? BUN 27 (H) 11/18/2021  ? CREATININE 0.77 11/18/2021  ? ? ?-------------------------------------------------------------------------------------------------- ? ?Past Medical History:  ?Diagnosis Date  ? Aortic atherosclerosis ([place])   ? Arthritis   ? right shoulder  ? 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 04/2021 [facility]  ? Cancer ([place]) 2019  ? b/l ovaries and left fallopian tube pelvic lymph nodes negative   ? Guillain Barr? syndrome ([place])   ? 2/2 flu shot 05/2018,  intubated x 8 days  ? Hyperlipidemia   ? Lymphedema   ? Overactive bladder   ? Thyroid disease   ? UTI (urinary tract infection)   ? Vitamin D deficiency   ? ? ?Past Surgical History:  ?Procedure Laterality Date  ? ABDOMINAL HYSTERECTOMY    ? 02/15/18   ? CHOLECYSTECTOMY  2008  ? LAPAROSCOPIC VAGINAL HYSTERECTOMY WITH SALPINGO OOPHORECTOMY  02/15/2018  ? PORTACATH PLACEMENT Left 02/25/2018  ? Procedure: INSERTION PORT-A-CATH;  Surgeon: Robert Bellow, MD;  Location: [facility] ORS;  Service: General;  Laterality: Left;  ? TOTAL ABDOMINAL HYSTERECTOMY W/ BILATERAL SALPINGOOPHORECTOMY  02/15/2018  ? lymph nodes removed  ? ? ?Current Meds  ?Medication Sig  ? aspirin EC 81 MG tablet Take 81 mg by mouth daily.  ? finasteride (PROSCAR) 5 MG tablet Take 5 mg by mouth daily.  ? Fluticasone-Umeclidin-Vilant (TRELEGY ELLIPTA) 100-62.5-25 MCG/ACT AEPB Inhale 1 puff into the lungs daily.  ? levalbuterol (XOPENEX HFA) 45 MCG/ACT inhaler Inhale 2 puffs into the lungs every 6 (six) hours as needed for wheezing or  shortness of breath.  ? SYNTHROID 150 MCG tablet Take 1 tablet (150 mcg total) by mouth daily before breakfast. 30 min before food. You can take 150 mcg qd on Sundays 1.5 tablets d/c 175 mcg  ? Vibegron (GEMTESA) 75 MG TABS Take 75 mg by mouth daily.  ? Vitamin D, Ergocalciferol, (DRISDOL) 1.25 MG (50000 UNIT) CAPS  capsule TAKE 1 CAPSULE (50,000 UNITS TOTAL) BY MOUTH ONCE A WEEK. D3  ? ? ?Allergies: Novocain [procaine], Crestor [rosuvastatin], Influenza vaccines, and Lipitor [atorvastatin calcium] ? ?Social History  ? ?Tobacco Use  ? Smoking status: Former  ?  Packs/day: 1.00  ?  Years: 23.00  ?  Pack years: 23.00  ?  Types: Cigarettes  ?  Quit date: 37  ?  Years since quitting: 36.3  ? Smokeless tobacco: Never  ?Vaping Use  ? Vaping Use: Never used  ?Substance Use Topics  ? Alcohol use: Never  ? Drug use: Never  ? ? ?Family History  ?Problem Relation Age of Onset  ? Sudden death Mother 13  ?     drowning in hurricane Katrina  ? Pancreatic cancer Father   ?     deceased early 90s; smoker  ? Alcohol abuse Father   ? COPD Father   ? Melanoma Sister   ?     deceased at 59  ? COPD Sister   ? Heart disease Brother   ?     Angina  ? Coronary artery disease Brother   ? Heart disease Brother   ?     Heart murmur  ? Prostate cancer Maternal Uncle   ?     deceased 79  ? Breast cancer Paternal Aunt   ?     unsure of this dx  ? ? ?Review of Systems: ?A 12-system review of systems was performed and was negative except as noted in the HPI. ? ?-------------------------------------------------------------------------------------------------- ? ?Physical Exam: ?BP 116/60 (BP Location: Right Arm, Patient Position: Sitting, Cuff Size: Large)   Pulse 73   Ht '5\' 6"'$  (1.676 m)   Wt 192 lb (87.1 kg)   SpO2 98%   BMI 30.99 kg/m?  ? ?General: NAD. ?HEENT: No conjunctival pallor or scleral icterus. Facemask in place. ?Neck: Supple without lymphadenopathy, thyromegaly.  JVP approximately 6-8 cm with positive HJR. ?Lungs: Normal work of breathing. Clear to auscultation bilaterally without wheezes or crackles. ?Heart: Regular rate and rhythm without murmurs, rubs, or gallops. Non-displaced PMI. ?Abd: Bowel sounds present. Soft, NT/ND without hepatosplenomegaly ?Ext: Trace pretibial edema bilaterally. Radial, PT, and DP pulses are 2+  bilaterally ?Skin: Warm and dry without rash. ?Neuro: CNIII-XII intact. Strength and fine-touch sensation intact in upper and lower extremities bilaterally. ?Psych: Normal mood and affect. ? ?EKG: Normal sinus rhythm without abnormality. ? ?Lab Results  ?Component Value Date  ? WBC 6.6 11/18/2021  ? HGB 13.3 11/18/2021  ? HCT 41.2 11/18/2021  ? MCV 94.3 11/18/2021  ? PLT 311 11/18/2021  ? ? ?Lab Results  ?Component Value Date  ? NA 144 11/18/2021  ? K 4.6 11/18/2021  ? CL 106 11/18/2021  ? CO2 27 11/18/2021  ? BUN 27 (H) 11/18/2021  ? CREATININE 0.77 11/18/2021  ? GLUCOSE 98 11/18/2021  ? ALT 14 11/18/2021  ? ? ?Lab Results  ?Component Value Date  ? CHOL 204 (H) 07/15/2021  ? HDL 61.10 07/15/2021  ? LDLCALC 123 (H) 07/15/2021  ? TRIG 99.0 07/15/2021  ? CHOLHDL 3 07/15/2021  ? ? ? ?-------------------------------------------------------------------------------------------------- ? ?ASSESSMENT AND  PLAN: ?Dyspnea on exertion and chronic HFpEF: ?Melissa Anderson reports progressive exertional dyspnea over months to years that she attributes to inactivity related to chronic left hip pain.  Echocardiogram in March was interpreted as normal left ventricular systolic and diastolic parameters with elevated CVP.  On my personal review of the echocardiogram, e' velocity is abnormal, suggestive of pseudonormalization.  Notably, there is no significant LVH or left atrial enlargement.  However, given symptoms, TDI parameters, and elevated CVP, I suspect that Melissa Anderson has an element of diastolic heart failure contributing to her exertional dyspnea.  Concurrent asthmatic bronchitis and deconditioning may be playing a role.  She appears mildly volume overloaded on examination today with trace pretibial edema (reportedly chronic) as well as borderline JVD.  We have agreed to add furosemide 20 mg daily and reassess her volume status and symptoms in about a month.  BMP will be drawn at that time as well.  I would have a low threshold  for noninvasive ischemia testing, particularly if symptoms do not improve. ? ?Follow-up: Return to clinic in 1 month with BMP at that time. ? ?Nelva Bush, MD ?12/29/2021 ?3:23 PM ? ?

## 2021-12-31 ENCOUNTER — Encounter: Payer: Self-pay | Admitting: Internal Medicine

## 2021-12-31 DIAGNOSIS — I5032 Chronic diastolic (congestive) heart failure: Secondary | ICD-10-CM | POA: Insufficient documentation

## 2021-12-31 DIAGNOSIS — R0609 Other forms of dyspnea: Secondary | ICD-10-CM | POA: Insufficient documentation

## 2021-12-31 DIAGNOSIS — Z20822 Contact with and (suspected) exposure to covid-19: Secondary | ICD-10-CM | POA: Diagnosis not present

## 2022-01-02 DIAGNOSIS — Z20822 Contact with and (suspected) exposure to covid-19: Secondary | ICD-10-CM | POA: Diagnosis not present

## 2022-01-11 ENCOUNTER — Ambulatory Visit: Payer: Medicare Other

## 2022-01-11 ENCOUNTER — Other Ambulatory Visit: Payer: Medicare Other

## 2022-01-21 ENCOUNTER — Other Ambulatory Visit: Payer: Self-pay | Admitting: Internal Medicine

## 2022-02-01 ENCOUNTER — Ambulatory Visit: Payer: Medicare Other | Admitting: Medical

## 2022-02-14 NOTE — Progress Notes (Unsigned)
Cardiology Office Note    Date:  02/15/2022   ID:  ROANN MERK, DOB December 03, 1940, MRN [id number]  PCP:  Melissa Anderson  Cardiologist:  Melissa Anderson  Electrophysiologist:  None   Chief Complaint: Follow-up  History of Present Illness:   Melissa Anderson is a 81 y.o. female with history of HFpEF, aortic atherosclerosis, asthmatic bronchitis, ovarian cancer, Guillain-Barr syndrome, lymphedema, hypothyroidism, and HLD who presents for follow-up of HFpEF.  Echo from 04/2018 demonstrated an EF of 55 to 60%, normal wall motion, grade 1 diastolic dysfunction, and no significant valvular abnormalities.  PFTs in 03/2021 were concerning for moderate restrictive lung disease, though was a difficult study overall spirometry noted to be not acceptable or reproducible.  Most recent echo from 10/31/2021 demonstrated an EF of 60 to 65%, no regional wall motion abnormalities, normal LV diastolic function parameters, normal RV systolic function and ventricular cavity size, mild mitral regurgitation, and an estimated right atrial pressure of 15 mmHg.  Given these findings, pulmonology recommended evaluation by cardiology.  She was evaluated as a new patient by Melissa Anderson on 12/29/2021 at the request of Melissa Anderson for shortness of breath.  At that time, she noted shortness of breath when walking relatively short distances and attributed this to pain in her right hip that has limited her activity level.  Symptoms have been progressive over months to years.  She was without symptoms of angina.  She has chronic bilateral pretibial edema that has been stable for years.  He was suspected she had a component of diastolic CHF contributing to her exertional dyspnea along with concurrent asthmatic bronchitis and deconditioning.  She was started on furosemide 20 mg daily.  She comes in today feeling largely the same as she did at her last visit from a cardiac perspective.  She does feel like her hip  discomfort is worse.  She has not noted significant urine output following the addition of furosemide until the evening hours.  She takes furosemide early in the morning.  She does wonder if some of her nocturnal urine output is related to Melissa Anderson.  Her weight is down 3 pounds today by our scale when compared to her last clinic visit.  Lower extremity swelling is unchanged.  She reports having lymphedema pumps at home though does not use these.  She does not elevate her legs when sitting.  She has compression stockings at home, though does not wear them.  She does not monitor her salt intake.  She reports only drinking 16 ounces of Perrier water and 1 cup of coffee daily.  She is without symptoms of chest pain, palpitations, dizziness, presyncope, or syncope.  No orthopnea.   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    04/2021 [facility]   Cancer ([place]) 2019   b/l ovaries and left fallopian tube pelvic lymph nodes negative    Guillain Barr syndrome ([place])    2/2 flu shot 05/2018,  intubated x 8 days   Hyperlipidemia    Lymphedema    Overactive bladder    Thyroid disease    UTI (urinary tract infection)    Vitamin D deficiency  Past Surgical History:  Procedure Laterality Date   ABDOMINAL HYSTERECTOMY     02/15/18    CHOLECYSTECTOMY  2008   LAPAROSCOPIC VAGINAL HYSTERECTOMY WITH SALPINGO OOPHORECTOMY  02/15/2018   PORTACATH PLACEMENT Left 02/25/2018   Procedure: INSERTION PORT-A-CATH;  Surgeon: Melissa Anderson;  Location: [facility] ORS;  Service: General;  Laterality: Left;   TOTAL ABDOMINAL HYSTERECTOMY W/ BILATERAL SALPINGOOPHORECTOMY  02/15/2018   lymph nodes removed    Current  Medications: Current Meds  Medication Sig   aspirin EC 81 MG tablet Take 81 mg by mouth daily.   finasteride (PROSCAR) 5 MG tablet Take 5 mg by mouth daily.   Fluticasone-Umeclidin-Vilant (TRELEGY ELLIPTA) 100-62.5-25 MCG/ACT AEPB Inhale 1 puff into the lungs daily.   furosemide (LASIX) 20 MG tablet Take 1 tablet (20 mg total) by mouth daily.   levalbuterol (XOPENEX HFA) 45 MCG/ACT inhaler Inhale 2 puffs into the lungs every 6 (six) hours as needed for wheezing or shortness of breath.   SYNTHROID 150 MCG tablet Take 1 tablet (150 mcg total) by mouth daily before breakfast. 30 min before food. You can take 150 mcg qd on Sundays 1.5 tablets d/c 175 mcg   Vibegron (GEMTESA) 75 MG TABS Take 75 mg by mouth daily.   Vitamin D, Ergocalciferol, (DRISDOL) 1.25 MG (50000 UNIT) CAPS capsule TAKE 1 CAPSULE (50,000 UNITS TOTAL) BY MOUTH ONCE A WEEK. D3    Allergies:   Novocain [procaine], Crestor [rosuvastatin], Influenza vaccines, and Lipitor [atorvastatin calcium]   Social History   Socioeconomic History   Marital status: Widowed    Spouse name: Not on file   Number of children: Not on file   Years of education: Not on file   Highest education level: Not on file  Occupational History   Not on file  Tobacco Use   Smoking status: Former    Packs/day: 1.00    Years: 23.00    Total pack years: 23.00    Types: Cigarettes    Quit date: 41    Years since quitting: 36.4   Smokeless tobacco: Never  Vaping Use   Vaping Use: Never used  Substance and Sexual Activity   Alcohol use: Never   Drug use: Never   Sexual activity: Not Currently  Other Topics Concern   Not on file  Social History Stage manager, bachelors degree    Lives in [place] here visiting daughter Melissa Anderson    Former smoker quit age 65 y.o    Social Determinants of Health   Financial Resource Strain: [place]  (10/21/2021)   Overall Financial Resource Strain (CARDIA)    Difficulty of Paying Living Expenses: Not hard at  all  Food Insecurity: No Food Insecurity (10/21/2021)   Hunger Vital Sign    Worried About Running Out of Food in the Last Year: Never true    [place] in the Last Year: Never true  Transportation Needs: No Transportation Needs (10/21/2021)   PRAPARE - Hydrologist (Medical): No    Lack of Transportation (Non-Medical): No  Physical Activity: Insufficiently Active (10/21/2021)   Exercise Vital Sign    Days of Exercise per Week: 2 days    Minutes of Exercise per Session: 60 min  Stress: No Stress Concern Present (10/21/2021)   [place]    Feeling of Stress : Not at all  Social Connections: Unknown (10/21/2021)   Social Connection and Isolation Panel [NHANES]  Frequency of Communication with Friends and Family: More than three times a week    Frequency of Social Gatherings with Friends and Family: Not on file    Attends Religious Services: Not on file    Active Member of Clubs or Organizations: Not on file    Attends Archivist Meetings: Not on file    Marital Status: Not on file     Family History:  The patient's family history includes Alcohol abuse in her father; Breast cancer in her paternal aunt; COPD in her father and sister; Coronary artery disease in her brother; Heart disease in her brother and brother; Melanoma in her sister; Pancreatic cancer in her father; Prostate cancer in her maternal uncle; Sudden death (age of onset: 48) in her mother.  ROS:   12-point review of systems is negative unless otherwise noted in the HPI.   EKGs/Labs/Other Studies Reviewed:    Studies reviewed were summarized above. The additional studies were reviewed today:  2D echo 10/31/2021: 1. Left ventricular ejection fraction, by estimation, is 60 to 65%. The  left ventricle has normal function. The left ventricle has no regional  wall motion abnormalities. Left ventricular diastolic  parameters were  normal.   2. Right ventricular systolic function is normal. The right ventricular  size is normal.   3. The mitral valve is normal in structure. Mild mitral valve  regurgitation.   4. The aortic valve is tricuspid. Aortic valve regurgitation is not  visualized.   5. The inferior vena cava is dilated in size with <50% respiratory  variability, suggesting right atrial pressure of 15 mmHg.   Comparison(s): LVEF 55-60%. __________  2D echo 05/16/2018: - Left ventricle: The cavity size was normal. There was mild    concentric hypertrophy. Systolic function was normal. The    estimated ejection fraction was in the range of 55% to 60%. Wall    motion was normal; there were no regional wall motion    abnormalities. Doppler parameters are consistent with abnormal    left ventricular relaxation (grade 1 diastolic dysfunction).  - Pulmonary arteries: Systolic pressure could not be accurately    estimated.    EKG:  EKG is not ordered today.  Recent Labs: 11/18/2021: ALT 14; BUN 27; Creat 0.77; Hemoglobin 13.3; Platelets 311; Potassium 4.6; Sodium 144; TSH 2.46  Recent Lipid Panel    Component Value Date/Time   CHOL 204 (H) 07/15/2021 1133   TRIG 99.0 07/15/2021 1133   HDL 61.10 07/15/2021 1133   CHOLHDL 3 07/15/2021 1133   VLDL 19.8 07/15/2021 1133   LDLCALC 123 (H) 07/15/2021 1133   LDLCALC 109 (H) 05/09/2018 0867    PHYSICAL EXAM:    VS:  BP 120/60 (BP Location: Left Arm, Patient Position: Sitting, Cuff Size: Large)   Pulse 83   Ht '5\' 6"'$  (1.676 m)   Wt 189 lb (85.7 kg)   SpO2 96%   BMI 30.51 kg/m   BMI: Body mass index is 30.51 kg/m.  Physical Exam Vitals reviewed.  Constitutional:      Appearance: She is well-developed.  HENT:     Head: Normocephalic and atraumatic.  Eyes:     General:        Right eye: No discharge.        Left eye: No discharge.  Neck:     Vascular: No JVD.  Cardiovascular:     Rate and Rhythm: Normal rate and regular rhythm.      Pulses:  Posterior tibial pulses are 2+ on the right side and 2+ on the left side.     Heart sounds: Normal heart sounds, S1 normal and S2 normal. Heart sounds not distant. No midsystolic click and no opening snap. No murmur heard.    No friction rub.  Pulmonary:     Effort: Pulmonary effort is normal. No respiratory distress.     Breath sounds: Normal breath sounds. No decreased breath sounds, wheezing or rales.  Chest:     Chest wall: No tenderness.  Abdominal:     General: There is no distension.     Palpations: Abdomen is soft.     Tenderness: There is no abdominal tenderness.  Musculoskeletal:     Cervical back: Normal range of motion.     Right lower leg: Edema present.     Left lower leg: Edema present.     Comments: Trace bilateral lower extremity swelling with mild venous stasis.  Skin:    General: Skin is warm and dry.     Nails: There is no clubbing.  Neurological:     Mental Status: She is alert and oriented to person, place, and time.  Psychiatric:        Speech: Speech normal.        Behavior: Behavior normal.        Thought Content: Thought content normal.        Judgment: Judgment normal.     Wt Readings from Last 3 Encounters:  02/15/22 189 lb (85.7 kg)  12/29/21 192 lb (87.1 kg)  12/09/21 188 lb (85.3 kg)     ASSESSMENT & PLAN:   HFpEF/lymphedema: Overall, her dyspnea is largely unchanged.  Dyspnea is felt to be multifactorial including diastolic heart failure, asthmatic bronchitis, obesity, and physical deconditioning that is exacerbated by progression of hip pain.  Given ongoing dyspnea, we will pursue Lexiscan MPI.  I have encouraged her to wear compression socks, use her lymphedema pump, and elevate legs when sitting.  Reduce salt intake.  For now, we will continue her on furosemide 20 mg daily.  Check BMP.  Aortic atherosclerosis/HLD: LDL 123 in 06/2021 with target LDL at least less than 70.  Consider addition of statin and follow-up.  This  was not discussed in detail at today's visit.   Visit was interrupted by fire drill.   Shared Decision Making/Informed Consent{  The risks [chest pain, shortness of breath, cardiac arrhythmias, dizziness, blood pressure fluctuations, myocardial infarction, stroke/transient ischemic attack, nausea, vomiting, allergic reaction, radiation exposure, metallic taste sensation and life-threatening complications (estimated to be 1 in 10,000)], benefits (risk stratification, diagnosing coronary artery disease, treatment guidance) and alternatives of a nuclear stress test were discussed in detail with Melissa Anderson and she agrees to proceed.     Disposition: F/u with Melissa Anderson or an APP in 1 month.   Medication Adjustments/Labs and Tests Ordered: Current medicines are reviewed at length with the patient today.  Concerns regarding medicines are outlined above. Medication changes, Labs and Tests ordered today are summarized above and listed in the Patient Instructions accessible in Encounters.   Signed, Christell Faith, PA-C 02/15/2022 9:31 AM     [facility] [address] [place] [address] [place], [place] [address] [phone]

## 2022-02-15 ENCOUNTER — Other Ambulatory Visit
Admission: RE | Admit: 2022-02-15 | Discharge: 2022-02-15 | Disposition: A | Discharge status: Home / Self Care | Payer: Medicare Other | Source: Ambulatory Visit | Attending: Physician Assistant | Admitting: Physician Assistant

## 2022-02-15 ENCOUNTER — Encounter: Payer: Self-pay | Admitting: Physician Assistant

## 2022-02-15 ENCOUNTER — Ambulatory Visit (INDEPENDENT_AMBULATORY_CARE_PROVIDER_SITE_OTHER): Payer: Medicare Other | Admitting: Physician Assistant

## 2022-02-15 VITALS — BP 120/60 | HR 83 | Ht 66.0 in | Wt 189.0 lb

## 2022-02-15 DIAGNOSIS — R0609 Other forms of dyspnea: Secondary | ICD-10-CM | POA: Insufficient documentation

## 2022-02-15 DIAGNOSIS — I890 Lymphedema, not elsewhere classified: Secondary | ICD-10-CM

## 2022-02-15 DIAGNOSIS — I700 Atherosclerosis of aorta: Secondary | ICD-10-CM | POA: Diagnosis not present

## 2022-02-15 DIAGNOSIS — I5032 Chronic diastolic (congestive) heart failure: Secondary | ICD-10-CM | POA: Diagnosis not present

## 2022-02-15 DIAGNOSIS — E785 Hyperlipidemia, unspecified: Secondary | ICD-10-CM | POA: Diagnosis not present

## 2022-02-15 LAB — BASIC METABOLIC PANEL
Anion gap: 6 (ref 5–15)
BUN: 29 mg/dL — ABNORMAL HIGH (ref 8–23)
CO2: 29 mmol/L (ref 22–32)
Calcium: 9.3 mg/dL (ref 8.9–10.3)
Chloride: 106 mmol/L (ref 98–111)
Creatinine, Ser: 0.74 mg/dL (ref 0.44–1.00)
GFR, Estimated: >60 mL/min (ref >60)
Glucose, Bld: 105 mg/dL — ABNORMAL HIGH (ref 70–99)
Potassium: 4.4 mmol/L (ref 3.5–5.1)
Sodium: 141 mmol/L (ref 135–145)

## 2022-02-15 NOTE — Patient Instructions (Addendum)
Medication Instructions:  - Your physician recommends that you continue on your current medications as directed. Please refer to the Current Medication list given to you today.  *If you need a refill on your cardiac medications before your next appointment, please call your pharmacy*   Lab Work: - Your physician recommends that you have lab work today: [place] Entrance at [facility] 1st desk on the right to check in (REGISTRATION)  Lab hours: Monday- Friday (7:30 am- 5:30 pm)   If you have labs (blood work) drawn today and your tests are completely normal, you will receive your results only by: MyChart Message (if you have MyChart) OR A paper copy in the mail If you have any lab test that is abnormal or we need to change your treatment, we will call you to review the results.   Testing/Procedures:  1) Lexiscan Myoview: (CHemical stress test) - Your physician has requested that you have a lexiscan myoview.   [place]  Your caregiver has ordered a Stress Test with nuclear imaging. The purpose of this test is to evaluate the blood supply to your heart muscle. This procedure is referred to as a "Non-Invasive Stress Test." This is because other than having an IV started in your vein, nothing is inserted or "invades" your body. Cardiac stress tests are done to find areas of poor blood flow to the heart by determining the extent of coronary artery disease (CAD). Some patients exercise on a treadmill, which naturally increases the blood flow to your heart, while others who are  unable to walk on a treadmill due to physical limitations have a pharmacologic/chemical stress agent called Lexiscan . This medicine will mimic walking on a treadmill by temporarily increasing your coronary blood flow.   Please note: these test may take anywhere between 2-4 hours to complete  PLEASE REPORT TO [place] AT THE FIRST DESK WILL DIRECT YOU WHERE TO GO  Date of  Procedure:_____________________________________  Arrival Time for Procedure:______________________________  Instructions regarding medication:   __xx__ : Hold LASIX (furosemide) the morning of your test  _xx___:  You may take all of your other regular morning medications not listed above   PLEASE NOTIFY THE OFFICE AT LEAST 24 HOURS IN ADVANCE IF YOU ARE UNABLE TO [place].  [phone] AND  PLEASE NOTIFY NUCLEAR MEDICINE AT [facility] AT LEAST 24 HOURS IN ADVANCE IF YOU ARE UNABLE TO KEEP YOUR APPOINTMENT. [phone]  How to prepare for your Myoview test:  Do not eat or drink after midnight No caffeine for 24 hours prior to test No smoking 24 hours prior to test. Your medication may be taken with water.  If your doctor stopped a medication because of this test, do not take that medication. Ladies, please do not wear dresses.  Skirts or pants are appropriate. Please wear a short sleeve shirt. No perfume, cologne or lotion. Wear comfortable walking shoes. No heels!   Follow-Up: At [facility], you and your health needs are our priority.  As part of our continuing mission to provide you with exceptional heart care, we have created designated Provider Care Teams.  These Care Teams include your primary Cardiologist (physician) and Advanced Practice Providers (APPs -  Physician Assistants and Nurse Practitioners) who all work together to provide you with the care you need, when you need it.  We recommend signing up for the patient portal called "MyChart".  Sign up information is provided on this After Visit Summary.  MyChart is used to connect with patients for Virtual Visits (Telemedicine).  Patients are able to view lab/test results, encounter notes, upcoming appointments, etc.  Non-urgent messages can be sent to your provider as well.   To learn more about what you can do with MyChart, go to [email or web address].    Your next appointment:   1 month(s)  The format for  your next appointment:   In Person  Provider:   You may see Nelva Bush, MD or one of the following Advanced Practice Providers on your designated Care Team:    Christell Faith, PA-C    Other Instructions  Cardiac Nuclear Scan A cardiac nuclear scan is a test that is done to check the flow of blood to your heart. It is done when you are resting and when you are exercising. The test looks for problems such as: Not enough blood reaching a portion of the heart. The heart muscle not working as it should. You may need this test if: You have heart disease. You have had lab results that are not normal. You have had heart surgery or a balloon procedure to open up blocked arteries (angioplasty). You have chest pain. You have shortness of breath. In this test, a special dye (tracer) is put into your bloodstream. The tracer will travel to your heart. A camera will then take pictures of your heart to see how the tracer moves through your heart. This test is usually done at a hospital and takes 2-4 hours. Tell a doctor about: Any allergies you have. All medicines you are taking, including vitamins, herbs, eye drops, creams, and over-the-counter medicines. Any problems you or family members have had with anesthetic medicines. Any blood disorders you have. Any surgeries you have had. Any medical conditions you have. Whether you are pregnant or may be pregnant. What are the risks? Generally, this is a safe test. However, problems may occur, such as: Serious chest pain and heart attack. This is only a risk if the stress portion of the test is done. Rapid heartbeat. A feeling of warmth in your chest. This feeling usually does not last long. Allergic reaction to the tracer. What happens before the test? Ask your doctor about changing or stopping your normal medicines. This is important. Follow instructions from your doctor about what you cannot eat or drink. Remove your jewelry on the day of the  test. What happens during the test? An IV tube will be inserted into one of your veins. Your doctor will give you a small amount of tracer through the IV tube. You will wait for 20-40 minutes while the tracer moves through your bloodstream. Your heart will be monitored with an electrocardiogram (ECG). You will lie down on an exam table. Pictures of your heart will be taken for about 15-20 minutes. You may also have a stress test. For this test, one of these things may be done: You will be asked to exercise on a treadmill or a stationary bike. You will be given medicines that will make your heart work harder. This is done if you are unable to exercise. When blood flow to your heart has peaked, a tracer will again be given through the IV tube. After 20-40 minutes, you will get back on the exam table. More pictures will be taken of your heart. Depending on the tracer that is used, more pictures may need to be taken 3-4 hours later. Your IV tube will be removed when the test is over. The test may  vary among doctors and hospitals. What happens after the test? Ask your doctor: Whether you can return to your normal schedule, including diet, activities, and medicines. Whether you should drink more fluids. This will help to remove the tracer from your body. Drink enough fluid to keep your pee (urine) pale yellow. Ask your doctor, or the department that is doing the test: When will my results be ready? How will I get my results? Summary A cardiac nuclear scan is a test that is done to check the flow of blood to your heart. Tell your doctor whether you are pregnant or may be pregnant. Before the test, ask your doctor about changing or stopping your normal medicines. This is important. Ask your doctor whether you can return to your normal activities. You may be asked to drink more fluids. This information is not intended to replace advice given to you by your health care provider. Make sure you  discuss any questions you have with your health care provider. Document Revised: 04/27/2021 Document Reviewed: 01/26/2021 Elsevier Patient Education  [place]

## 2022-02-23 ENCOUNTER — Encounter: Payer: Self-pay | Admitting: Internal Medicine

## 2022-02-23 ENCOUNTER — Telehealth: Payer: Self-pay | Admitting: *Deleted

## 2022-02-23 ENCOUNTER — Ambulatory Visit (INDEPENDENT_AMBULATORY_CARE_PROVIDER_SITE_OTHER): Payer: Medicare Other | Admitting: Internal Medicine

## 2022-02-23 VITALS — BP 120/70 | HR 74 | Temp 97.6°F | Resp 14 | Ht 66.0 in | Wt 189.6 lb

## 2022-02-23 DIAGNOSIS — E039 Hypothyroidism, unspecified: Secondary | ICD-10-CM

## 2022-02-23 DIAGNOSIS — M25551 Pain in right hip: Secondary | ICD-10-CM | POA: Diagnosis not present

## 2022-02-23 DIAGNOSIS — M25552 Pain in left hip: Secondary | ICD-10-CM

## 2022-02-23 NOTE — Telephone Encounter (Signed)
-----   Message from Rise Mu, PA-C sent at 02/23/2022  9:36 AM EDT ----- Jeannene Patella,  Please reach out to the patient to get Lexiscan MPI rescheduled for preoperative cardiac risk stratification and dyspnea. Thanks. ----- Message ----- From: McLean-Scocuzza, Nino Glow, MD Sent: 02/23/2022   9:25 AM EDT To: Nelva Bush, MD; Rise Mu, PA-C  Pt cancelled stress test wants Dr. Saunders Revel to order it please. She may need this before and clearance before hip surgery nothing scheduled yet  And I guess review of PE side effects

## 2022-02-23 NOTE — Patient Instructions (Addendum)
  Dr. Volanda Napoleon will be your new PCP starting 02/2022    [facility]   [place], [place] [address]   [phone]   Lamar Benes., MD   [place], [place] [address]   [phone] (Work)   [phone] (Fax)  Call for them to burn CDs for the hip     [address] [employer] reviews Department of motor vehicles in [place], [place] in: [place] Address: [address], [place],  [address] Hours:  Open ? 9-5?PM Phone: [phone]

## 2022-02-23 NOTE — Telephone Encounter (Signed)
-----   Message from Rise Mu, PA-C sent at 02/23/2022  9:57 AM EDT ----- Hold off on rescheduling the nuc. We will assess in follow up

## 2022-02-23 NOTE — Telephone Encounter (Signed)
Spoke with patient and she did not want to reschedule lexiscan at this time. Patient states she does not want to have a heart attack doing the test. She was sorry but stated "excuse me but he was not a damn doctor". She wanted this test explained to her in more detail and why she needs to have it done. I reviewed reasons it was ordered and she continued to go through her reasons and questions. She was difficult to follow because she was talking about so many things. I tried to redirect her to exactly what she wants to do and she was agreeable to come in and see provider. Moved up her appointment and she then started again reviewing what she was to discuss. Encouraged her to address all of this at the upcoming appointment. She then continued to go over many different things which were not cardiac in nature. Redirected her again and confirmed appointment, and her request to wait on scheduling this test until she comes in to be seen. She was appreciative for the call with no further questions.

## 2022-02-23 NOTE — Telephone Encounter (Signed)
Left voicemail message to call back in order for [place] to reschedule her stress test for cardiac clearance.

## 2022-02-23 NOTE — Progress Notes (Signed)
Chief Complaint  Patient presents with   Follow-up    3 mon, disc about stress test that was sch for tomorrow but pt has cancelled stating she didn't qualify & side effect could be heart attack.    Form Completion    Fill out disability license form   F/u  1. Wants handicap plate filled out form 2. Hip pain wants surgery with ortho [facility] in [place] declined stress test tomorrow wants Dr. Saunders Revel to order and was c/w side effects  3. Hypothyroidism on syn 150 qd and 1.5 tablets on Sundays     Review of Systems  Constitutional:  Negative for weight loss.  HENT:  Negative for hearing loss.   Eyes:  Negative for blurred vision.  Respiratory:  Negative for shortness of breath.   Cardiovascular:  Negative for chest pain.  Gastrointestinal:  Negative for abdominal pain and blood in stool.  Genitourinary:  Negative for dysuria.  Musculoskeletal:  Positive for joint pain. Negative for falls.  Skin:  Negative for rash.  Neurological:  Negative for headaches.  Psychiatric/Behavioral:  Negative for depression.    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    04/2021 mohs   Cancer ([place]) 2019   b/l ovaries and left fallopian tube pelvic lymph nodes negative    Guillain Barr syndrome ([place])    2/2 flu shot 05/2018,  intubated x 8 days   Hyperlipidemia    Lymphedema    Overactive bladder    Thyroid disease    UTI (urinary tract infection)    Vitamin D deficiency    Past Surgical History:  Procedure Laterality Date   ABDOMINAL HYSTERECTOMY     02/15/18    CHOLECYSTECTOMY  2008   LAPAROSCOPIC VAGINAL HYSTERECTOMY WITH SALPINGO OOPHORECTOMY  02/15/2018   PORTACATH PLACEMENT Left 02/25/2018   Procedure: INSERTION PORT-A-CATH;  Surgeon: Robert Bellow, MD;  Location: [facility] ORS;  Service: General;  Laterality: Left;   TOTAL ABDOMINAL HYSTERECTOMY W/ BILATERAL SALPINGOOPHORECTOMY  02/15/2018    lymph nodes removed   Family History  Problem Relation Age of Onset   Sudden death Mother 90       drowning in hurricane Katrina   Pancreatic cancer Father        deceased early 86s; smoker   Alcohol abuse Father    COPD Father    Melanoma Sister        deceased at 79   COPD Sister    Heart disease Brother        Angina   Coronary artery disease Brother    Heart disease Brother        Heart murmur   Prostate cancer Maternal Uncle        deceased 17   Breast cancer Paternal Aunt        unsure of this dx   Social History   Socioeconomic History   Marital status: Widowed    Spouse name: Not on file   Number of children: Not on file   Years of education: Not on file   Highest education level: Not on file  Occupational History   Not on file  Tobacco Use   Smoking status: Former    Packs/day: 1.00    Years: 23.00    Total pack years: 23.00    Types: Cigarettes    Quit date: 80  Years since quitting: 36.5   Smokeless tobacco: Never  Vaping Use   Vaping Use: Never used  Substance and Sexual Activity   Alcohol use: Never   Drug use: Never   Sexual activity: Not Currently  Other Topics Concern   Not on file  Social History Narrative   Engineer, maintenance (IT), bachelors degree    Lives in [place] here visiting daughter [name]    Former smoker quit age 65 y.o    Social Determinants of Health   Financial Resource Strain: Low Risk  (10/21/2021)   Overall Financial Resource Strain (CARDIA)    Difficulty of Paying Living Expenses: Not hard at all  Food Insecurity: No Food Insecurity (10/21/2021)   Hunger Vital Sign    Worried About Running Out of Food in the Last Year: Never true    [place] in the Last Year: Never true  Transportation Needs: No Transportation Needs (10/21/2021)   PRAPARE - Hydrologist (Medical): No    Lack of Transportation (Non-Medical): No  Physical Activity: Insufficiently Active (10/21/2021)   Exercise Vital Sign     Days of Exercise per Week: 2 days    Minutes of Exercise per Session: 60 min  Stress: No Stress Concern Present (10/21/2021)   [place]    Feeling of Stress : Not at all  Social Connections: Unknown (10/21/2021)   Social Connection and Isolation Panel [NHANES]    Frequency of Communication with Friends and Family: More than three times a week    Frequency of Social Gatherings with Friends and Family: Not on file    Attends Religious Services: Not on file    Active Member of Clubs or Organizations: Not on file    Attends Archivist Meetings: Not on file    Marital Status: Not on file  Intimate Partner Violence: Not At Risk (10/21/2021)   Humiliation, Afraid, Rape, and Kick questionnaire    Fear of Current or Ex-Partner: No    Emotionally Abused: No    Physically Abused: No    Sexually Abused: No   Current Meds  Medication Sig   aspirin EC 81 MG tablet Take 81 mg by mouth daily.   finasteride (PROSCAR) 5 MG tablet Take 5 mg by mouth daily.   Fluticasone-Umeclidin-Vilant (TRELEGY ELLIPTA) 100-62.5-25 MCG/ACT AEPB Inhale 1 puff into the lungs daily.   furosemide (LASIX) 20 MG tablet Take 1 tablet (20 mg total) by mouth daily.   SYNTHROID 150 MCG tablet Take 1 tablet (150 mcg total) by mouth daily before breakfast. 30 min before food. You can take 150 mcg qd on Sundays 1.5 tablets d/c 175 mcg   Vibegron (GEMTESA) 75 MG TABS Take 75 mg by mouth daily.   Vitamin D, Ergocalciferol, (DRISDOL) 1.25 MG (50000 UNIT) CAPS capsule TAKE 1 CAPSULE (50,000 UNITS TOTAL) BY MOUTH ONCE A WEEK. D3   Allergies  Allergen Reactions   Novocain [Procaine] Palpitations   Crestor [Rosuvastatin]     Myopathy     Influenza Vaccines     ?GBS 05/2018    Lipitor [Atorvastatin Calcium]     Myalgias     Recent Results (from the past 2160 hour(s))  Basic metabolic panel     Status: Abnormal   Collection Time: 02/15/22 10:19 AM   Result Value Ref Range   Sodium 141 135 - 145 mmol/L   Potassium 4.4 3.5 - 5.1 mmol/L   Chloride 106 98 -  111 mmol/L   CO2 29 22 - 32 mmol/L   Glucose, Bld 105 (H) 70 - 99 mg/dL    Comment: Glucose reference range applies only to samples taken after fasting for at least 8 hours.   BUN 29 (H) 8 - 23 mg/dL   Creatinine, Ser 0.74 0.44 - 1.00 mg/dL   Calcium 9.3 8.9 - 10.3 mg/dL   GFR, Estimated >60 >60 mL/min    Comment: (NOTE) Calculated using the CKD-EPI Creatinine Equation (2021)    Anion gap 6 5 - 15    Comment: Performed at [facility], [address] [place], [place] [address]   Objective  Body mass index is 30.6 kg/m. Wt Readings from Last 3 Encounters:  02/23/22 189 lb 9.6 oz (86 kg)  02/15/22 189 lb (85.7 kg)  12/29/21 192 lb (87.1 kg)   Temp Readings from Last 3 Encounters:  02/23/22 97.6 F (36.4 C) (Oral)  12/09/21 97.7 F (36.5 C) (Oral)  11/18/21 98.2 F (36.8 C) (Oral)   BP Readings from Last 3 Encounters:  02/23/22 120/70  02/15/22 120/60  12/29/21 116/60   Pulse Readings from Last 3 Encounters:  02/23/22 74  02/15/22 83  12/29/21 73    Physical Exam Vitals and nursing note reviewed.  Constitutional:      Appearance: Normal appearance. She is well-developed and well-groomed.  HENT:     Head: Normocephalic and atraumatic.  Eyes:     Conjunctiva/sclera: Conjunctivae normal.     Pupils: Pupils are equal, round, and reactive to light.  Cardiovascular:     Rate and Rhythm: Normal rate and regular rhythm.     Heart sounds: Normal heart sounds. No murmur heard. Pulmonary:     Effort: Pulmonary effort is normal.     Breath sounds: Normal breath sounds.  Abdominal:     General: Abdomen is flat. Bowel sounds are normal.     Tenderness: There is no abdominal tenderness.  Musculoskeletal:        General: No tenderness.  Skin:    General: Skin is warm and dry.  Neurological:     General: No focal deficit present.     Mental Status: She  is alert and oriented to person, place, and time. Mental status is at baseline.     Cranial Nerves: Cranial nerves 2-12 are intact.     Motor: Motor function is intact.     Coordination: Coordination is intact.     Gait: Gait is intact.  Psychiatric:        Attention and Perception: Attention and perception normal.        Mood and Affect: Mood and affect normal.        Speech: Speech normal.        Behavior: Behavior normal. Behavior is cooperative.        Thought Content: Thought content normal.        Cognition and Memory: Cognition and memory normal.        Judgment: Judgment normal.     Assessment  Plan  Bilateral hip pain Will have surgery [facility] in [place] tbd   Hypothyroidism, on synthryoid 150 mg qd and 1.5 tablet ssundays  HM No further vaccines I.e FLU (had GBS after flu vaccine) due to GBS flu shot had 05/31/18. She had had flu shot prev and no sx's.  Tdap per pt had 5 years ago get record pt to get record  Prevnar, pna 23, shingles vaccines  -pt to track down records.  Pfizer 3/3 not had booster tolerated she is hesistent due to GBS to get 4th dose   Former smoker max 1 ppd from age 60 to 44 no FH lung cancer    Out of age window pap s/p total hysterectomy 02/15/18 for ovarian cancer with b/l ovaries and left fallopian tube effected negative b/l lymph nodes 3 LN bx'ed    02/06/2019 negative cologaurd -never had colonoscopy   Mammogram  10/27/21 mammogram scheduled negative     DEXA +osteopenia    rec healthy diet and exercise Provider: Dr. Olivia Mackie McLean-Scocuzza-Internal Medicine

## 2022-02-23 NOTE — Telephone Encounter (Signed)
Pt returning a call.

## 2022-02-26 ENCOUNTER — Other Ambulatory Visit: Payer: Self-pay | Admitting: Internal Medicine

## 2022-02-28 NOTE — Progress Notes (Unsigned)
Follow-up Outpatient Visit Date: 03/01/2022  Primary Care Provider: McLean-Scocuzza, Nino Glow, MD [place] [id number]  Chief Complaint: Follow-up dyspnea on exertion  HPI:  Melissa Anderson is a 81 y.o. female with history of aortic atherosclerosis, hyperlipidemia, asthmatic bronchitis, ovarian cancer, Guillain-Barr syndrome, lymphedema, and hypothyroidism, who presents for follow-up of shortness of breath.  She was last seen in our office ~2 weeks ago by Christell Faith, PA, at which time she did not feel any better than at our initial visit in early May when we agreed to start furosemide 20 mg daily for possible component of diastolic heart failure contributing to her shortness of breath.  Pharmacologic MPI was recommended to evaluate for underlying ischemic heart disease.  She cancelled the stress test and presents today for reevaluation.  Today, Melissa Anderson reports that she is feeling better with less shortness of breath since starting furosemide.  She continues to have leg edema but notes that she has not been using her lymphedema pumps regularly.  She plans to start wearing compression stocking.  She denies chest pain, palpitations, and lightheadedness.  She is concerned about moving forward with pharmacologic stress test due to potential risks of regadenoson.  --------------------------------------------------------------------------------------------------  Cardiovascular History & Procedures: Cardiovascular Problems: Dyspnea on exertion   Risk Factors: Aortic atherosclerosis, hyperlipidemia, obesity, and age greater than 31   Cath/PCI: None   CV Surgery: None   EP Procedures and Devices: None   Non-Invasive Evaluation(s): TTE (10/31/2021): Normal LV size and wall thickness.  LVEF 60-65% with normal diastolic function.  Normal RV size and function.  Normal biatrial size.  Mild mitral regurgitation.  Trivial tricuspid regurgitation.  Elevated CVP (~15  mmHg).  Recent CV Pertinent Labs: Lab Results  Component Value Date   CHOL 204 (H) 07/15/2021   HDL 61.10 07/15/2021   LDLCALC 123 (H) 07/15/2021   LDLCALC 109 (H) 05/09/2018   TRIG 99.0 07/15/2021   CHOLHDL 3 07/15/2021   K 4.4 02/15/2022   BUN 29 (H) 02/15/2022   CREATININE 0.74 02/15/2022   CREATININE 0.77 11/18/2021    Past medical and surgical history were reviewed and updated in EPIC.  Current Meds  Medication Sig   aspirin EC 81 MG tablet Take 81 mg by mouth daily.   finasteride (PROSCAR) 5 MG tablet Take 5 mg by mouth daily.   Fluticasone-Umeclidin-Vilant (TRELEGY ELLIPTA) 100-62.5-25 MCG/ACT AEPB Inhale 1 puff into the lungs daily.   furosemide (LASIX) 20 MG tablet TAKE 1 TABLET BY MOUTH EVERY DAY   levalbuterol (XOPENEX HFA) 45 MCG/ACT inhaler Inhale 2 puffs into the lungs every 6 (six) hours as needed for wheezing or shortness of breath.   SYNTHROID 150 MCG tablet Take 1 tablet (150 mcg total) by mouth daily before breakfast. 30 min before food. You can take 150 mcg qd on Sundays 1.5 tablets d/c 175 mcg   Vibegron (GEMTESA) 75 MG TABS Take 75 mg by mouth daily.   Vitamin D, Ergocalciferol, (DRISDOL) 1.25 MG (50000 UNIT) CAPS capsule TAKE 1 CAPSULE (50,000 UNITS TOTAL) BY MOUTH ONCE A WEEK. D3    Allergies: Novocain [procaine], Crestor [rosuvastatin], Influenza vaccines, and Lipitor [atorvastatin calcium]  Social History   Tobacco Use   Smoking status: Former    Packs/day: 1.00    Years: 23.00    Total pack years: 23.00    Types: Cigarettes    Quit date: 1987    Years since quitting: 36.5   Smokeless tobacco: Never  Vaping Use   Vaping  Use: Never used  Substance Use Topics   Alcohol use: Never   Drug use: Never    Family History  Problem Relation Age of Onset   Sudden death Mother 75       drowning in hurricane Katrina   Pancreatic cancer Father        deceased early 55s; smoker   Alcohol abuse Father    COPD Father    Melanoma Sister         deceased at 54   COPD Sister    Heart disease Brother        Angina   Coronary artery disease Brother    Heart disease Brother        Heart murmur   Prostate cancer Maternal Uncle        deceased 35   Breast cancer Paternal Aunt        unsure of this dx    Review of Systems: A 12-system review of systems was performed and was negative except as noted in the HPI.  --------------------------------------------------------------------------------------------------  Physical Exam: BP 120/76 (BP Location: Left Arm, Patient Position: Sitting, Cuff Size: Large)   Pulse 83   Ht '5\' 6"'$  (1.676 m)   Wt 191 lb (86.6 kg)   SpO2 96%   BMI 30.83 kg/m   General:  NAD. Neck: No JVD or HJR. Lungs: Clear to auscultation bilaterally without wheezes or crackles. Heart: Regular rate and rhythm without murmurs, rubs, or gallops. Abdomen: Soft, nontender, nondistended. Extremities: 1+ non-pitting edema in both calves.  Lab Results  Component Value Date   WBC 6.6 11/18/2021   HGB 13.3 11/18/2021   HCT 41.2 11/18/2021   MCV 94.3 11/18/2021   PLT 311 11/18/2021    Lab Results  Component Value Date   NA 141 02/15/2022   K 4.4 02/15/2022   CL 106 02/15/2022   CO2 29 02/15/2022   BUN 29 (H) 02/15/2022   CREATININE 0.74 02/15/2022   GLUCOSE 105 (H) 02/15/2022   ALT 14 11/18/2021    Lab Results  Component Value Date   CHOL 204 (H) 07/15/2021   HDL 61.10 07/15/2021   LDLCALC 123 (H) 07/15/2021   TRIG 99.0 07/15/2021   CHOLHDL 3 07/15/2021    --------------------------------------------------------------------------------------------------  ASSESSMENT AND PLAN: Dyspnea on exertion and chronic diastolic heart failure: Dyspnea has improved with low-dose furosemide, consistent with NYHA class 1-2.  Chronic leg edema is likely multifactorial, including an element of HFpEF, lymphedema, and venous insufficiency.  I recommend continuation of furosemide 20 mg daily.  We will cancel previously  arranged MPI and instead proceed with coronary CTA to evaluate for ischemic heart disease underlying exertional dyspnea and diastolic dysfunction.  Continue low-dose aspirin.  Hyperlipidemia: LDL mildly elevated on last check.  Melissa Anderson is intolerant of atorvastatin.  If coronary CTA shows significant ASCVD, we will need to consider rechallenging her with an alternate statin versus non-statin therapy.  Follow-up: Return to clinic in 6 months.  Nelva Bush, MD 03/01/2022 9:29 AM

## 2022-03-01 ENCOUNTER — Encounter: Payer: Self-pay | Admitting: Internal Medicine

## 2022-03-01 ENCOUNTER — Ambulatory Visit (INDEPENDENT_AMBULATORY_CARE_PROVIDER_SITE_OTHER): Payer: Medicare Other | Admitting: Internal Medicine

## 2022-03-01 VITALS — BP 120/76 | HR 83 | Ht 66.0 in | Wt 191.0 lb

## 2022-03-01 DIAGNOSIS — I5032 Chronic diastolic (congestive) heart failure: Secondary | ICD-10-CM

## 2022-03-01 DIAGNOSIS — E785 Hyperlipidemia, unspecified: Secondary | ICD-10-CM | POA: Diagnosis not present

## 2022-03-01 DIAGNOSIS — R0609 Other forms of dyspnea: Secondary | ICD-10-CM | POA: Diagnosis not present

## 2022-03-01 MED ORDER — METOPROLOL TARTRATE 100 MG PO TABS: 100.0000 mg | ORAL_TABLET | Freq: Once | ORAL | 0 refills | Status: DC

## 2022-03-01 NOTE — Patient Instructions (Addendum)
Medication Instructions:   Your physician recommends that you continue on your current medications as directed. Please refer to the Current Medication list given to you today.  *If you need a refill on your cardiac medications before your next appointment, please call your pharmacy*   Lab Work:  None ordered  Testing/Procedures:  Your cardiac CT is scheduled Monday 03/13/22 at 8:00 AM at the below location:  [facility] [place], [place] [address]  Please check in at the Registration Desk in the [place] (1st desk on the right)   Please arrive 15 mins early for check-in and test prep.  Please follow these instructions carefully (unless otherwise directed):  On the Night Before the Test:  Be sure to Drink plenty of water. Do not consume any caffeinated/decaffeinated beverages or chocolate 12 hours prior to your test. Do not take any antihistamines 12 hours prior to your test.   On the Day of the Test:  Drink plenty of water until 1 hour prior to the test. Do not eat any food 4 hours prior to the test. HOLD Furosemide morning of the test. You may take your remaining medications prior to the test.  Take metoprolol (Lopressor) two hours prior to test. FEMALES- please wear underwire-free bra if available, avoid dresses & tight clothing       After the Test: Drink plenty of water. After receiving IV contrast, you may experience a mild flushed feeling. This is normal. On occasion, you may experience a mild rash up to 24 hours after the test. This is not dangerous. If this occurs, you can take Benadryl 25 mg and increase your fluid intake. If you experience trouble breathing, this can be serious. If it is severe call 911 IMMEDIATELY. If it is mild, please call our office. If you take any of these medications: Glipizide/Metformin, Avandament, Glucavance, please do not take 48 hours after completing test unless otherwise  instructed.  We will call to schedule your test 2-4 weeks out understanding that some insurance companies will need an authorization prior to the service being performed.   For non-scheduling related questions, please contact the cardiac imaging nurse navigator should you have any questions/concerns: Marchia Bond, Cardiac Imaging Nurse Navigator Gordy Clement, Cardiac Imaging Nurse Navigator [facility] Direct Office Dial: [phone]   For scheduling needs, including cancellations and rescheduling, please call [place], [phone].    Follow-Up: At [facility], you and your health needs are our priority.  As part of our continuing mission to provide you with exceptional heart care, we have created designated Provider Care Teams.  These Care Teams include your primary Cardiologist (physician) and Advanced Practice Providers (APPs -  Physician Assistants and Nurse Practitioners) who all work together to provide you with the care you need, when you need it.  We recommend signing up for the patient portal called "MyChart".  Sign up information is provided on this After Visit Summary.  MyChart is used to connect with patients for Virtual Visits (Telemedicine).  Patients are able to view lab/test results, encounter notes, upcoming appointments, etc.  Non-urgent messages can be sent to your provider as well.   To learn more about what you can do with MyChart, go to [email or web address].    Your next appointment:   6 month(s)  The format for your next appointment:   In Person  Provider:   You may see Nelva Bush, MD or one of the following Advanced Practice Providers on  your designated Care Team:   Murray Hodgkins, NP Christell Faith, PA-C Cadence Kathlen Mody, PA-C{   Important Information About Sugar

## 2022-03-01 NOTE — Addendum Note (Signed)
Addended by: Darlyne Russian on: 03/01/2022 09:36 AM   Modules accepted: Orders

## 2022-03-01 NOTE — Telephone Encounter (Signed)
Patient here today to see provider here in the office.

## 2022-03-02 ENCOUNTER — Telehealth: Payer: Self-pay | Admitting: Internal Medicine

## 2022-03-02 MED ORDER — METOPROLOL TARTRATE 100 MG PO TABS: 100.0000 mg | ORAL_TABLET | Freq: Once | ORAL | 0 refills | Status: DC

## 2022-03-02 NOTE — Telephone Encounter (Signed)
*  STAT* If patient is at the pharmacy, call can be transferred to refill team.   1. Which medications need to be refilled? (please list name of each medication and dose if known)   metoprolol tartrate (LOPRESSOR) 100 MG tablet    2. Which pharmacy/location (including street and city if local pharmacy) is medication to be sent to? [id number] [place] [address]. Do they need a 30 day or 90 day supply? 1   Pt is supposed to take this pill 2 hours before her CT on 03/13/22. Pt states that she accidentally dropped it in her recycling bin and needs another one. Please advise.

## 2022-03-02 NOTE — Telephone Encounter (Signed)
Requested Prescriptions   Signed Prescriptions Disp Refills   metoprolol tartrate (LOPRESSOR) 100 MG tablet 1 tablet 0    Sig: Take 1 tablet (100 mg total) by mouth once for 1 dose. Take TWO hours prior to CT procedure    Authorizing Provider: END, CHRISTOPHER    Ordering User: Raelene Bott, Nozomi Mettler L

## 2022-03-06 ENCOUNTER — Other Ambulatory Visit: Payer: Medicare Other

## 2022-03-10 ENCOUNTER — Telehealth (HOSPITAL_COMMUNITY): Payer: Self-pay | Admitting: Emergency Medicine

## 2022-03-10 ENCOUNTER — Telehealth: Payer: Self-pay | Admitting: Nurse Practitioner

## 2022-03-10 ENCOUNTER — Ambulatory Visit (INDEPENDENT_AMBULATORY_CARE_PROVIDER_SITE_OTHER): Payer: Medicare Other | Admitting: Pulmonary Disease

## 2022-03-10 ENCOUNTER — Encounter: Payer: Self-pay | Admitting: Pulmonary Disease

## 2022-03-10 VITALS — BP 120/78 | HR 81 | Temp 98.0°F | Ht 66.0 in | Wt 192.0 lb

## 2022-03-10 DIAGNOSIS — J449 Chronic obstructive pulmonary disease, unspecified: Secondary | ICD-10-CM

## 2022-03-10 DIAGNOSIS — J4489 Other specified chronic obstructive pulmonary disease: Secondary | ICD-10-CM

## 2022-03-10 DIAGNOSIS — R0602 Shortness of breath: Secondary | ICD-10-CM | POA: Diagnosis not present

## 2022-03-10 DIAGNOSIS — I5032 Chronic diastolic (congestive) heart failure: Secondary | ICD-10-CM | POA: Diagnosis not present

## 2022-03-10 NOTE — Telephone Encounter (Signed)
Returned patient's call. No answer. Left vm.

## 2022-03-10 NOTE — Patient Instructions (Signed)
Your lungs sounded very good today.  Continue using the Trelegy.  We will see him in follow-up in 3 months time.  To call sooner than that should any new breathing difficulties arise.

## 2022-03-10 NOTE — Telephone Encounter (Signed)
Reaching out to patient to offer assistance regarding upcoming cardiac imaging study; pt verbalizes understanding of appt date/time, parking situation and where to check in, pre-test NPO status and medications ordered, and verified current allergies; name and call back number provided for further questions should they arise Marchia Bond RN Navigator Cardiac Imaging [facility] [phone] office [phone] cell  Arrival 730 '100mg'$  metoprolol tartrate

## 2022-03-10 NOTE — Progress Notes (Signed)
Subjective:    Patient ID: Melissa Anderson, female    DOB: 28-Mar-1941, 81 y.o.   MRN: [id number] Patient Care Team: McLean-Scocuzza, Nino Glow, MD as PCP - General (Internal Medicine) End, Harrell Gave, MD as PCP - Cardiology (Cardiology) Gillis Ends, MD as Referring Physician (Obstetrics and Gynecology) Bary Castilla Forest Gleason, MD as Surgeon (General Surgery) Sindy Guadeloupe, MD as Consulting Physician (Oncology) Clent Jacks, RN as Oncology Nurse Navigator  Chief Complaint  Patient presents with   Follow-up    Occ SOB with activity.    HPI Melissa Anderson is an 81 year old remote former smoker with a history as noted below, who follows here for the issue of shortness of breath and chronic asthmatic bronchitis.  This is a scheduled visit.  We last saw Tiauna on 09 December 2021, at that time we saw her being well compensated on Trelegy and was instructed to continue Trelegy and as needed levo albuterol.  Since her prior visit she has not had any new issues or concerns.  Her shortness of breath which was her presenting symptom is markedly improved on the Trelegy and also using diuretic which has been prescribed by cardiology for chronic diastolic heart failure.  She has started to be more active and notes that this has also helped her stamina.  Today she does not have any significant complaint.  Overall she feels well and looks well.   DATA 04/12/2021 PFTs: FEV1 1.13 L or 52% predicted, FVC 1.94 L or 67% predicted, FEV1/FVC 58%, no bronchodilator study was performed, lung volumes were normal.  Flow volume loop was consistent with obstruction.  Consistent with moderate obstructive defect.  Review of Systems A 10 point review of systems was performed and it is as noted above otherwise negative.  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[place]) 06/17/2018   Chemotherapy-induced neuropathy ([place]) 05/14/2018   Vitamin D deficiency 05/08/2018   Neuropathy 05/08/2018   Overactive bladder 05/08/2018   Bilateral leg edema 05/08/2018   HLD (hyperlipidemia) 05/08/2018   Goals of care, counseling/discussion 02/25/2018   Ovarian cancer ([place]) 02/22/2018   Graves' disease 02/13/2018   Social History   Tobacco Use   Smoking status: Former    Packs/day: 1.00    Years: 23.00    Total pack years: 23.00    Types: Cigarettes    Quit date: 1987    Years since quitting: 37.0   Smokeless tobacco: Never  Substance Use Topics   Alcohol use: Never   Allergies  Allergen Reactions   Novocain [Procaine] Palpitations   Crestor [Rosuvastatin]     Myopathy     Influenza Vaccines     ?GBS 05/2018    Lipitor [Atorvastatin Calcium]     Myalgias     Medications were reviewed.  She is currently on Trelegy Ellipta 100 daily and albuterol for rescue with regards to her asthmatic bronchitis.  Immunization History  Administered Date(s) Administered   Influenza, High Dose Seasonal PF 05/31/2018   Influenza-Unspecified 05/13/2018   PFIZER Comirnaty(Gray Top)Covid-19 Tri-Sucrose  Vaccine 11/15/2020   PFIZER(Purple Top)SARS-COV-2 Vaccination 02/09/2020, 03/02/2020       Objective:   Physical Exam BP 120/78 (BP Location: Left Arm, Cuff Size: Normal)   Pulse 81   Temp 98 F (36.7 C) (Temporal)   Ht '5\' 6"'$  (1.676 m)   Wt 192 lb (87.1 kg)   SpO2 96%   BMI 30.99 kg/m   GENERAL: Well-developed, well-nourished woman who looks age appropriate, no acute distress.  Fully ambulatory.  No  conversational dyspnea. HEAD: Normocephalic, atraumatic.  EYES: Pupils equal, round, reactive to light.  No scleral icterus.  MOUTH: Dentition intact, oral mucosa moist.  No thrush. NECK: Supple. No thyromegaly. Trachea midline. No JVD.  No adenopathy. PULMONARY: Good air entry bilaterally.  No adventitious sounds. CARDIOVASCULAR: S1 and S2. Regular rate and rhythm.  No rubs, murmurs or gallops heard. ABDOMEN: Benign. MUSCULOSKELETAL: No joint deformity, no clubbing, no edema.  NEUROLOGIC: No overt focal deficit, no gait disturbance, speech is fluent. SKIN: Intact,warm,dry. PSYCH: Behavior normal      Assessment & Plan:     ICD-10-CM   1. Asthmatic bronchitis , chronic (HCC) - moderate  J44.9    Continue Trelegy 100 Continue as needed levo albuterol    2. SOB (shortness of breath)  R06.02    Markedly improved with Trelegy and Lasix daily Component of asthmatic bronchitis and diastolic heart failure    3. Chronic diastolic heart failure (HCC)  I50.32    This issue adds complexity to her management Followed by cardiology     The patient appears to be doing well today.  We will see her in follow-up in 3 months time she is to call sooner should any new difficulties arise.  Renold Don, MD Advanced Bronchoscopy PCCM [facility]    *This note was dictated using voice recognition software/Dragon.  Despite best efforts to proofread, errors can occur which can change the meaning. Any transcriptional errors that result from this process are unintentional and may not be fully corrected at the time of dictation.

## 2022-03-13 ENCOUNTER — Ambulatory Visit
Admission: RE | Admit: 2022-03-13 | Discharge: 2022-03-13 | Disposition: A | Discharge status: Home / Self Care | Payer: Medicare Other | Source: Ambulatory Visit | Attending: Internal Medicine | Admitting: Internal Medicine

## 2022-03-13 DIAGNOSIS — I5032 Chronic diastolic (congestive) heart failure: Secondary | ICD-10-CM | POA: Diagnosis not present

## 2022-03-13 DIAGNOSIS — R0609 Other forms of dyspnea: Secondary | ICD-10-CM | POA: Insufficient documentation

## 2022-03-13 MED ORDER — NITROGLYCERIN 0.4 MG SL SUBL
SUBLINGUAL_TABLET | SUBLINGUAL | Status: AC
  Filled 2022-03-13: qty 2

## 2022-03-13 MED ORDER — IOHEXOL 350 MG/ML SOLN
100.0000 mL | Freq: Once | INTRAVENOUS | Status: AC | PRN
  Administered 2022-03-13: 100 mL via INTRAVENOUS

## 2022-03-13 MED ORDER — NITROGLYCERIN 0.4 MG SL SUBL
0.8000 mg | SUBLINGUAL_TABLET | Freq: Once | SUBLINGUAL | Status: AC
  Administered 2022-03-13: 0.8 mg via SUBLINGUAL

## 2022-03-13 NOTE — Progress Notes (Signed)
Patient tolerated procedure well. Ambulate w/o difficulty. Denies any lightheadedness or being dizzy. Pt denies any pain at this time. Sitting in chair, drinking water provided. Pt is encouraged to drink additional water throughout the day and reason explained to patient. Patient verbalized understanding and all questions answered. ABC intact. No further needs at this time. Discharge from procedure area w/o issues.  

## 2022-03-15 ENCOUNTER — Telehealth: Payer: Self-pay | Admitting: *Deleted

## 2022-03-15 NOTE — Telephone Encounter (Signed)
Attempted to call pt. No answer. Lmtcb.  

## 2022-03-15 NOTE — Telephone Encounter (Signed)
-----   Message from Nelva Bush, MD sent at 03/14/2022  4:50 PM EDT ----- Please let Melissa Anderson know that her coronary CTA shows that her heart arteries are normal without evidence of narrowing/blockage.  Chronic scarring was noted in the lungs.  I recommend that she continue her current medications and follow-up with [place] and Dr. Patsey Berthold as previously scheduled.

## 2022-03-16 ENCOUNTER — Ambulatory Visit: Payer: Medicare Other | Admitting: Physician Assistant

## 2022-03-16 NOTE — Telephone Encounter (Signed)
Patient made aware of Cardiac CTA results and Dr. Marya Landry recommendation with verbalized understanding.

## 2022-03-16 NOTE — Telephone Encounter (Signed)
Patient returned RN's call. 

## 2022-03-16 NOTE — Telephone Encounter (Signed)
Returned the pt call. Lmtcb.

## 2022-03-17 NOTE — Telephone Encounter (Signed)
Patient called asking to speak with Lauren. I informed her that she is out of the office until Monday and she requests a call when she gets back

## 2022-03-23 ENCOUNTER — Other Ambulatory Visit: Payer: Self-pay | Admitting: Internal Medicine

## 2022-03-23 DIAGNOSIS — C44311 Basal cell carcinoma of skin of nose: Secondary | ICD-10-CM | POA: Diagnosis not present

## 2022-04-08 ENCOUNTER — Other Ambulatory Visit: Payer: Self-pay | Admitting: Internal Medicine

## 2022-04-12 ENCOUNTER — Encounter: Payer: Self-pay | Admitting: Podiatry

## 2022-04-12 ENCOUNTER — Inpatient Hospital Stay: Payer: Medicare Other

## 2022-04-12 ENCOUNTER — Ambulatory Visit (INDEPENDENT_AMBULATORY_CARE_PROVIDER_SITE_OTHER): Payer: Medicare Other | Admitting: Podiatry

## 2022-04-12 ENCOUNTER — Inpatient Hospital Stay: Payer: Medicare Other | Attending: Obstetrics and Gynecology | Admitting: Obstetrics and Gynecology

## 2022-04-12 VITALS — BP 129/62 | HR 77 | Temp 98.7°F | Resp 20 | Wt 193.1 lb

## 2022-04-12 DIAGNOSIS — Z8543 Personal history of malignant neoplasm of ovary: Secondary | ICD-10-CM

## 2022-04-12 DIAGNOSIS — Z90722 Acquired absence of ovaries, bilateral: Secondary | ICD-10-CM | POA: Insufficient documentation

## 2022-04-12 DIAGNOSIS — M79676 Pain in unspecified toe(s): Secondary | ICD-10-CM

## 2022-04-12 DIAGNOSIS — I890 Lymphedema, not elsewhere classified: Secondary | ICD-10-CM

## 2022-04-12 DIAGNOSIS — C561 Malignant neoplasm of right ovary: Secondary | ICD-10-CM

## 2022-04-12 DIAGNOSIS — Z90710 Acquired absence of both cervix and uterus: Secondary | ICD-10-CM | POA: Insufficient documentation

## 2022-04-12 DIAGNOSIS — B351 Tinea unguium: Secondary | ICD-10-CM | POA: Diagnosis not present

## 2022-04-12 NOTE — Progress Notes (Signed)
Subjective:  Patient ID: Melissa Anderson, female    DOB: Jun 21, 1941,  MRN: [id number] HPI Chief Complaint  Patient presents with   Nail Problem    Hallux left - thick and discolored nail x months, tender with shoes sometimes   New Patient (Initial Visit)    Est pt 2019    81 y.o. female presents with the above complaint.   ROS: Denies fever chills nausea vomit muscle aches pains calf pain back pain chest pain shortness of breath.  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    04/2021 [facility]   Cancer ([place]) 2019   b/l ovaries and left fallopian tube pelvic lymph nodes negative    Guillain Barr syndrome ([place])    2/2 flu shot 05/2018,  intubated x 8 days   Hyperlipidemia    Lymphedema    Overactive bladder    Thyroid disease    UTI (urinary tract infection)    Vitamin D deficiency    Past Surgical History:  Procedure Laterality Date   ABDOMINAL HYSTERECTOMY     02/15/18    CHOLECYSTECTOMY  2008   LAPAROSCOPIC VAGINAL HYSTERECTOMY WITH SALPINGO OOPHORECTOMY  02/15/2018   PORTACATH PLACEMENT Left 02/25/2018   Procedure: INSERTION PORT-A-CATH;  Surgeon: Robert Bellow, MD;  Location: [facility] ORS;  Service: General;  Laterality: Left;   TOTAL ABDOMINAL HYSTERECTOMY W/ BILATERAL SALPINGOOPHORECTOMY  02/15/2018   lymph nodes removed    Current Outpatient Medications:    aspirin EC 81 MG tablet, Take 81 mg by mouth daily., Disp: , Rfl:    finasteride (PROSCAR) 5 MG tablet, Take 5 mg by mouth daily., Disp: , Rfl:    Fluticasone-Umeclidin-Vilant (TRELEGY ELLIPTA) 100-62.5-25 MCG/ACT AEPB, Inhale 1 puff into the lungs daily., Disp: 28 each, Rfl: 11   furosemide (LASIX) 20 MG tablet, TAKE 1 TABLET BY MOUTH EVERY DAY, Disp: 30 tablet, Rfl: 3   levalbuterol (XOPENEX HFA) 45 MCG/ACT inhaler, Inhale 2 puffs into the lungs every 6 (six) hours as needed for wheezing or shortness of  breath., Disp: 1 each, Rfl: 3   metoprolol tartrate (LOPRESSOR) 100 MG tablet, Take 1 tablet (100 mg total) by mouth once for 1 dose. Take TWO hours prior to CT procedure, Disp: 1 tablet, Rfl: 0   SYNTHROID 150 MCG tablet, Take 1 tablet (150 mcg total) by mouth daily before breakfast. 30 min before food. You can take 150 mcg qd on Sundays 1.5 tablets d/c 175 mcg, Disp: 140 tablet, Rfl: 3   Vibegron (GEMTESA) 75 MG TABS, Take 75 mg by mouth daily., Disp: 30 tablet, Rfl: 11   Vitamin D, Ergocalciferol, (DRISDOL) 1.25 MG (50000 UNIT) CAPS capsule, TAKE 1 CAPSULE (50,000 UNITS TOTAL) BY MOUTH ONCE A WEEK. D3, Disp: 12 capsule, Rfl: 1  Allergies  Allergen Reactions   Novocain [Procaine] Palpitations   Crestor [Rosuvastatin]     Myopathy     Influenza Vaccines     ?GBS 05/2018    Lipitor [Atorvastatin Calcium]     Myalgias     Review of Systems Objective:  There were no vitals filed for this visit.  General: Well developed, nourished, in no acute distress, alert and oriented x3   Dermatological: Skin is warm, dry and supple bilateral. Nails x 10 are thick yellow dystrophic clinically mycotic hallux left is severely thickened with subungual debris.  Remaining integument appears unremarkable at  this time. There are no open sores, no preulcerative lesions, no rash or signs of infection present.  Vascular: Dorsalis Pedis artery and Posterior Tibial artery pedal pulses are 2/4 bilateral with immedate capillary fill time. Pedal hair growth present. No varicosities and no lower extremity edema present bilateral.   Neruologic: Grossly intact via light touch bilateral. Vibratory intact via tuning fork bilateral. Protective threshold with Semmes Wienstein monofilament intact to all pedal sites bilateral. Patellar and Achilles deep tendon reflexes 2+ bilateral. No Babinski or clonus noted bilateral.   Musculoskeletal: No gross boney pedal deformities bilateral. No pain, crepitus, or limitation noted  with foot and ankle range of motion bilateral. Muscular strength 5/5 in all groups tested bilateral.  Gait: Unassisted, Nonantalgic.    Radiographs:  None taken  Assessment & Plan:   Assessment: Nail dystrophy pain in limb secondary to onychomycosis  Plan: Debrided toenails 1 through 5 bilateral today follow-up with her in 3 months     Tuere Nwosu T. [place], [place]

## 2022-04-12 NOTE — Progress Notes (Signed)
[place] at [facility]  [phone] [phone]  Patient Care Team: McLean-Scocuzza, Nino Glow, MD as PCP - General (Internal Medicine) End, Harrell Gave, MD as PCP - Cardiology (Cardiology) Gillis Ends, MD as Referring Physician (Obstetrics and Gynecology) Bary Castilla Forest Gleason, MD as Surgeon (General Surgery) Sindy Guadeloupe, MD as Consulting Physician (Oncology) Clent Jacks, RN as Oncology Nurse Navigator   Name of the patient: Melissa Anderson  [id number]  1941/04/19   Date of visit: 04/12/2022  Gynecologic Oncology Interval Visit   Referring Provider: Dr Mike Gip  Chief Complaint: stage II ovarian cancer  Subjective:  Melissa Anderson is a 81 y.o. 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    Her overall condition is still the same. She continues to have issues with incontinence, hip pain, and lymphedema.  CA 125 is pending at time of visit.       Component Ref Range & Units 6 mo ago (10/12/21) 9 mo ago (07/13/21) 1 yr ago (01/05/21) 1 yr ago (09/08/20) 1 yr ago (06/02/20) 2 yr ago (02/11/20) 3 yr ago (10/09/18)  Cancer Antigen (CA) 125 0.0 - 38.1 U/mL 16.9  18.2 CM  16.4 CM  16.8 CM  18.6 CM  16.0 CM  16.9 CM      Gynecologic Oncology History:  She was at her PCP office in March and was complaining of intermittent weakness and pain shooting down her right leg. Her PCP ordered an MRI of her lumbosacral spine which demonstrated this right adnexal mass as an incidental finding.   Radiologic Imaging: 11/12/2017 Lumbosacral spine MRI scan noted a multicystic 4 x 5.5 cm right adnexal mass with fluid blood levels.  Prominent uterine endometrial stripe.   12/20/2017 Pelvic Ultrasound: Uterus measures 9.7 x 4.1 x 5.9cm and is retroverted. Endometrial stripe measures 9 mm. Within it, as outlines by a small amount of fluid, there is a 1.4 x 0.7 x 1.4 cm polypoid echogenic structure with internal blood flow and small cystic components. A similar appearing but smaller 5 x 4 x 3 mm echogenic nodular density is noted within the endometrial cavity as well. The uterus is otherwise normal in appearance. There is a multiloculated complex cystic structure in the right adnexa measuring 6.3 x 6.1 x 4.7 cm. This has solid appearing components as well as anechoic portions, separated by thick septations. On MRI, many of these loculations demonstrate fluid-filled levels. No normal ovarian parenchyma is identified. The left ovary was not seen. There is no pelvic free fluid.  She subsequently underwent an EMB with her gynecologist and this was negative.   She apparently was not aware that she had a cyst this size until she was seen by her provider who recommended Gyn Oncology consultation for further evaluation. She was seen by Dr. Carlton Adam who recommended surgery and further imaging and tumor markers.   02/05/2018: CA 125 = 18, CA 19-9 = 8, CEA <0.5, OVA1 = 4 (low risk)  02/09/2018 CT Scan A/P: multi-septated complex cystic lesion noted in the right pelvis.adnexa immediately superior to the uterus which measures approximately 6.9 x 5.3 am in greatest axial dimensions and approximately 5.4 cm in greatest cranial caudal dimensions.  Abnormal thickening of the endometrial cavity which measures 1.3  cm in greatest width.  Multiple peripelvic cysts are identified in bilateral renal hilar regions measuring up to 3.6 cm on the right and 2.9 cm on the left.  On 02/15/18, she underwent TLH, BSO, with bilateral pelvic LN dissection, peritoneal biopsies, and omental biopsy with Dr. Unknown Foley at [facility]. No spread beyond tube and ovaries.  She was  discharged on POD1 w/o complication.    Pathology A. 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  See the synoptic report. Note: The tumor is positive for PAX8, WT1, and p53 (strong, uniform). Based on the presence of tubal intraepithelial carcinoma, this is considered a tubal primary.  B.  Endometrium, curettage: Fragments of endometrial polyp.  C. Uterus, total hysterectomy:   Uterus:    Endometrium: Endometrial polyp with focal glandular crowding, see note.    Myometrium: Adenomyosis, leiomyomata (up to 1 cm).    Cervix: No pathologic diagnosis.     Serosa: No pathologic diagnosis.  Note: The areas of endometrial glandular crowding are not cytologically distinct from the background and therefore do not meet the criteria for endometrial intraepithelial neoplasia (EIN).  D. Right pelvic lymph nodes, lymphadenectomy: Three lymph nodes, negative for malignancy (0/3).   E. Left pelvic lymph nodes, lymphadenectomy: Three lymph nodes, negative for malignancy (0/3).   F. Omentum, omentectomy: Benign omentum.  G. Peritoneal biopsy #1: Benign fibromuscular tissue.  H. Peritoneal biopsy #2: Benign fibrous tissue.  I. Peritoneal biopsy #3: Benign fibroadipose tissue.  J. Peritoneal biopsy #4: Benign fibroadipose tissue.  A. Pelvic Washings: - Negative. No Evidence of Malignancy  03/21/18. She was referred to Dr. Janese Banks, medical-oncology at [facility] and initiated cycle 1 of adjuvant carbo-Taxol on 03/21/18.   05/28/2018 Patient received cycle 4 of chemotherapy on . After 4 cycles of chemotherapy patient developed progressive weakness in her bilateral lower extremities and was admitted to the hospital in [place] where she developed worsening respiratory failure and had to be intubated was airlifted  to [place].  Patient was eventually extubated.  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  She was given IVIG as well.  Of note she has received flu shot consistently in the past and has never had these complications ever. Patient was then sent to a neuro rehab unit and was eventually discharged home.   08/16/2018 - CT C/A/P IMPRESSION: 1. Status post hysterectomy and bilateral salpingo oophorectomy. No findings for metastatic ovarian cancer. No peritoneal surface or omental disease or abdominal/pelvic lymphadenopathy. 2. Mild chronic scarring changes in the lungs but no findings worrisome for pulmonary metastatic disease.  She has had her covid vaccines without complication.   Imaging was 06/01/20 which was negative for metastatic disease.   03/04/2021 CT C/A/P IMPRESSION: 1. Stable CTs of the chest, abdomen and pelvis status post hysterectomy. No evidence of local recurrence or metastatic disease. 2. Stable pulmonary scarring and chronic central airway thickening. 3. Stable incidental findings including renal sinus parapelvic cysts, probable pelvic floor laxity and mild Aortic Atherosclerosis (ICD10-I70.0).  08/16/2021 IMPRESSION: 1. Bilateral parapelvic renal cysts. 2. Severe right hip degenerative changes. 3.  Aortic Atherosclerosis (ICD10-I70.0).  10/19/2020 RECOMMENDATION: Screening mammogram in one year.(Code:[id number])  BI-RADS CATEGORY  1: Negative.  CA 125 (not elevated at time of diagnosis) 10/12/2021  16.9 07/13/2021 18.2 01/05/21 16.4 09/08/2020 16.8 06/02/20 18.6 02/11/20 16 10/09/2018 16.9 02/05/2018 18 (diagnosis)    Other medical issues She has a h/o prior breast mass and she requested we order her mammogram done on 10/27/2021 IMPRESSION: BI-RADS CATEGORY  1: Negative. RECOMMENDATION: Screening mammogram in one year  Genetic  Testing:  - [employer] 83 gene Multi-Cancer panel was negative for pathogenic mutation in any genes. No VUS detected.   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/17/2018   Chemotherapy-induced neuropathy ([place]) 05/14/2018   Vitamin D deficiency 05/08/2018   Neuropathy 05/08/2018   Overactive bladder 05/08/2018   Bilateral leg edema 05/08/2018   HLD (hyperlipidemia) 05/08/2018   Goals of care, counseling/discussion 02/25/2018   Ovarian cancer ([place]) 02/22/2018   Graves' disease 02/13/2018    Past Medical History: Past Medical History:  Diagnosis Date   Aortic atherosclerosis ([place])    Arthritis    right shoulder   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    04/2021 m[facility]s   Cancer ([place]) 2019   b/l ovaries and left fallopian tube pelvic lymph nodes negative    Guillain Barr syndrome ([place])    2/2 flu shot 05/2018,  intubated x 8 days   Hyperlipidemia    Lymphedema    Overactive bladder    Thyroid disease    UTI (urinary tract infection)    Vitamin D deficiency     Past Surgical History: Past Surgical History:  Procedure Laterality Date   ABDOMINAL HYSTERECTOMY     02/15/18    CHOLECYSTECTOMY  2008   LAPAROSCOPIC VAGINAL HYSTERECTOMY WITH SALPINGO OOPHORECTOMY  02/15/2018   PORTACATH PLACEMENT Left 02/25/2018   Procedure: INSERTION PORT-A-CATH;  Surgeon: Robert Bellow, MD;  Location: [facility] ORS;  Service: General;  Laterality: Left;   TOTAL ABDOMINAL  HYSTERECTOMY W/ BILATERAL SALPINGOOPHORECTOMY  02/15/2018   lymph nodes removed    OB History:  OB History  Gravida Para Term Preterm AB Living  2         2  SAB IAB Ectopic Multiple Live Births               # Outcome Date GA Lbr Len/2nd Weight Sex Delivery Anes PTL Lv  2 Gravida           1 Gravida             Obstetric Comments  Menstrual age: 58    Age 1st Pregnancy: 27    Family History: Family History  Problem Relation Age of Onset   Sudden death Mother 42       drowning in hurricane Katrina   Pancreatic cancer Father        deceased early 33s; smoker   Alcohol abuse Father    COPD Father    Melanoma Sister        deceased at 22   COPD Sister    Heart disease Brother        Angina   Coronary artery disease Brother  Heart disease Brother        Heart murmur   Prostate cancer Maternal Uncle        deceased 22   Breast cancer Paternal Aunt        unsure of this dx    Social History: Social History   Socioeconomic History   Marital status: Widowed    Spouse name: Not on file   Number of children: Not on file   Years of education: Not on file   Highest education level: Not on file  Occupational History   Not on file  Tobacco Use   Smoking status: Former    Packs/day: 1.00    Years: 23.00    Total pack years: 23.00    Types: Cigarettes    Quit date: 8    Years since quitting: 36.6   Smokeless tobacco: Never  Vaping Use   Vaping Use: Never used  Substance and Sexual Activity   Alcohol use: Never   Drug use: Never   Sexual activity: Not Currently  Other Topics Concern   Not on file  Social History Stage manager, bachelors degree    Lives in [place] here visiting daughter [name]    Former smoker quit age 76 y.o    Social Determinants of Health   Financial Resource Strain: Low Risk  (10/21/2021)   Overall Financial Resource Strain (CARDIA)    Difficulty of Paying Living Expenses: Not hard at all  Food Insecurity: No Food  Insecurity (10/21/2021)   Hunger Vital Sign    Worried About Running Out of Food in the Last Year: Never true    [place] in the Last Year: Never true  Transportation Needs: No Transportation Needs (10/21/2021)   PRAPARE - Hydrologist (Medical): No    Lack of Transportation (Non-Medical): No  Physical Activity: Insufficiently Active (10/21/2021)   Exercise Vital Sign    Days of Exercise per Week: 2 days    Minutes of Exercise per Session: 60 min  Stress: No Stress Concern Present (10/21/2021)   [place]    Feeling of Stress : Not at all  Social Connections: Unknown (10/21/2021)   Social Connection and Isolation Panel [NHANES]    Frequency of Communication with Friends and Family: More than three times a week    Frequency of Social Gatherings with Friends and Family: Not on file    Attends Religious Services: Not on file    Active Member of [place] or Organizations: Not on file    Attends Archivist Meetings: Not on file    Marital Status: Not on file  Intimate Partner Violence: Not At Risk (10/21/2021)   Humiliation, Afraid, Rape, and Kick questionnaire    Fear of Current or Ex-Partner: No    Emotionally Abused: No    Physically Abused: No    Sexually Abused: No   Immunization History  Administered Date(s) Administered   Influenza, High Dose Seasonal PF 05/31/2018   Influenza-Unspecified 05/13/2018   PFIZER Comirnaty(Gray Top)Covid-19 Tri-Sucrose Vaccine 11/15/2020   PFIZER(Purple Top)SARS-COV-2 Vaccination 02/09/2020, 03/02/2020   Allergies: Allergies  Allergen Reactions   Novocain [Procaine] Palpitations   Crestor [Rosuvastatin]     Myopathy     Influenza Vaccines     ?GBS 05/2018    Lipitor [Atorvastatin Calcium]     Myalgias      Review of Systems General: no complaints  HEENT: no complaints  Lungs: no complaints  Cardiac: no complaints  GI:  Incontinence  GU: no complaints  Musculoskeletal: Hip pain and lymphedema  Extremities: no complaints  Skin: no complaints  Neuro: no complaints  Endocrine: no complaints  Psych: no complaints          Objective:  Physical Examination: BP 129/62   Pulse 77   Temp 98.7 F (37.1 C)   Resp 20   Wt 193 lb 1.6 oz (87.6 kg)   SpO2 95%   BMI 31.17 kg/m    ECOG Performance Status: 1 - Symptomatic but completely ambulatory  GENERAL: Patient is a well appearing female given her stated age in no acute distress HEENT:  Atraumatic and normocephalic.  NODES:  No cervical, supraclavicular, axillary, or inguinal lymphadenopathy palpated.  LUNGS:  Clear to auscultation bilaterally.  No wheezes. HEART:  Regular rate and rhythm.  ABDOMEN:  Soft, nontender. Nondistended. No masses/ascites/hernia/or hepatomegaly.  EXTREMITIES:  No peripheral edema.   SKIN:  Clear with no obvious rashes or skin changes. No nail dyscrasia. NEURO:  Nonfocal. Well oriented.  Appropriate affect.  Pelvic: She had a really hard time tolerating pelvic exam so we did a bimanual exam only EGBUS: no lesions Cervix: surgically absent Vagina: no lesions on palpation, no discharge or bleeding Uterus: surgically absent BME: no palpable masses Rectovaginal: deferred      Assessment:  FAHMIDA JURICH is a 81 y.o. 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 Unknown Foley. She has initiated chemotherapy 03/21/2018 with carbo-Taxol and is treated by Dr. Janese Banks at [facility]. Chemotherapy discontinued after 05/28/2018 due to GBS from flu shot. Clinically NED. Radiographically NED in July 2022. CA 125, not elevated at diagnosis but monitored is pending at time of visit.  Overall reassuring exam and visit.  Peripheral neuropathy.   Symptomatic bilateral lymphedema, possibly secondary to surgery.   She  has completed germline genetic testing which was negative for mutation.   Hip Pain- secondary to severe degenerative changes.  Orthopedic visit pending  Urinary incontinence  Medical co-morbidities complicating care: BMI of 29.41 Plan:    CA125 pending today. Clinically she is asymptomatic. We discussed surveillance and I offered imaging every 12 months given CA125 is not a marker for her versus imaging if she is symptomattic. She would like to have annual imaging.  We ordered her neck scan for her return visit in 6 months.  Plan for CT scan chest abdomen and pelvis  Continue to plan for physical exam including pelvic exam in 6 months then every 6 months for until five years, then annually thereafter.  Somatic testing was not recommended at this time because she does not have a BRCA mutation and is not a candidate for maintenance PARP inhibitor after completing primary chemotherapy.  Somatic testing for mutation and HRD can be considered should she have recurrence.   Lymphedema-continue to follow-up with prior providers  Hip pain- she requested advise on how to see for hip replacement.  Orthopedic appointment pending  Urinary incontinence -continue to follow with urogynecology  Christana Angelica Gaetana Michaelis, MD

## 2022-04-13 LAB — CA 125: Cancer Antigen (CA) 125: 18.4 U/mL (ref 0.0–38.1)

## 2022-04-24 ENCOUNTER — Ambulatory Visit (INDEPENDENT_AMBULATORY_CARE_PROVIDER_SITE_OTHER): Payer: Medicare Other | Admitting: Vascular Surgery

## 2022-05-15 ENCOUNTER — Encounter (INDEPENDENT_AMBULATORY_CARE_PROVIDER_SITE_OTHER): Payer: Self-pay | Admitting: Vascular Surgery

## 2022-05-15 ENCOUNTER — Ambulatory Visit (INDEPENDENT_AMBULATORY_CARE_PROVIDER_SITE_OTHER): Payer: Medicare Other | Admitting: Vascular Surgery

## 2022-05-15 VITALS — BP 145/76 | HR 76 | Resp 16 | Wt 191.4 lb

## 2022-05-15 DIAGNOSIS — I890 Lymphedema, not elsewhere classified: Secondary | ICD-10-CM | POA: Diagnosis not present

## 2022-05-15 DIAGNOSIS — I5032 Chronic diastolic (congestive) heart failure: Secondary | ICD-10-CM | POA: Diagnosis not present

## 2022-05-15 DIAGNOSIS — E785 Hyperlipidemia, unspecified: Secondary | ICD-10-CM | POA: Diagnosis not present

## 2022-05-15 DIAGNOSIS — M47816 Spondylosis without myelopathy or radiculopathy, lumbar region: Secondary | ICD-10-CM

## 2022-05-15 NOTE — Progress Notes (Signed)
MRN : [id number]  Melissa Anderson is a 81 y.o. ([birth year]) female who presents with chief complaint of legs swell.  History of Present Illness:   The patient returns to the office for followup evaluation regarding leg swelling.  The swelling has persisted but with the lymph pump is much, much better controlled.  She notes that the pump is essential for her wellbeing that is how effective it is and how crucial it is to her treatment plan.  The pain associated with swelling is essentially eliminated. There have not been any interval development of a ulcerations or wounds.   The patient states that recently she began having problems with the pump.  She states something is going wrong with the timer and she is not able to use the pump.  She also notes that without the pump when she wears her compression stockings she develops a crease at the level of the ankle.     Patient stated that overall the lymph pump has been a very positive factor in her care and she feels very strongly she needs to get it fixed..    Current Meds  Medication Sig   aspirin EC 81 MG tablet Take 81 mg by mouth daily.   finasteride (PROSCAR) 5 MG tablet Take 5 mg by mouth daily.   Fluticasone-Umeclidin-Vilant (TRELEGY ELLIPTA) 100-62.5-25 MCG/ACT AEPB Inhale 1 puff into the lungs daily.   furosemide (LASIX) 20 MG tablet TAKE 1 TABLET BY MOUTH EVERY DAY   levalbuterol (XOPENEX HFA) 45 MCG/ACT inhaler Inhale 2 puffs into the lungs every 6 (six) hours as needed for wheezing or shortness of breath.   metoprolol tartrate (LOPRESSOR) 100 MG tablet Take 1 tablet (100 mg total) by mouth once for 1 dose. Take TWO hours prior to CT procedure   SYNTHROID 150 MCG tablet Take 1 tablet (150 mcg total) by mouth daily before breakfast. 30 min before food. You can take 150 mcg qd on Sundays 1.5 tablets d/c 175 mcg   Vibegron (GEMTESA) 75 MG TABS Take 75 mg by mouth daily.   Vitamin D, Ergocalciferol, (DRISDOL) 1.25 MG (50000 UNIT)  CAPS capsule TAKE 1 CAPSULE (50,000 UNITS TOTAL) BY MOUTH ONCE A WEEK. 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    04/2021 [facility]   Cancer ([place]) 2019   b/l ovaries and left fallopian tube pelvic lymph nodes negative    Guillain Barr syndrome ([place])    2/2 flu shot 05/2018,  intubated x 8 days   Hyperlipidemia    Lymphedema    Overactive bladder    Thyroid disease    UTI (urinary tract infection)    Vitamin D deficiency     Past Surgical History:  Procedure Laterality Date   ABDOMINAL HYSTERECTOMY     02/15/18    CHOLECYSTECTOMY  2008   LAPAROSCOPIC VAGINAL HYSTERECTOMY WITH SALPINGO OOPHORECTOMY  02/15/2018   PORTACATH PLACEMENT Left 02/25/2018   Procedure: INSERTION PORT-A-CATH;  Surgeon: Robert Bellow, MD;  Location: [facility] ORS;  Service: General;  Laterality: Left;   TOTAL ABDOMINAL HYSTERECTOMY W/ BILATERAL SALPINGOOPHORECTOMY  02/15/2018   lymph nodes removed    Social History Social History   Tobacco Use   Smoking status: Former    Packs/day: 1.00    Years: 23.00    Total pack years: 23.00  Types: Cigarettes    Quit date: 10    Years since quitting: 36.7   Smokeless tobacco: Never  Vaping Use   Vaping Use: Never used  Substance Use Topics   Alcohol use: Never   Drug use: Never    Family History Family History  Problem Relation Age of Onset   Sudden death Mother 39       drowning in hurricane Katrina   Pancreatic cancer Father        deceased early 27s; smoker   Alcohol abuse Father    COPD Father    Melanoma Sister        deceased at 28   COPD Sister    Heart disease Brother        Angina   Coronary artery disease Brother    Heart disease Brother        Heart murmur   Prostate cancer Maternal Uncle        deceased 74   Breast cancer Paternal Aunt        unsure of this dx    Allergies  Allergen Reactions   Novocain  [Procaine] Palpitations   Crestor [Rosuvastatin]     Myopathy     Influenza Vaccines     ?GBS 05/2018    Lipitor [Atorvastatin Calcium]     Myalgias       REVIEW OF SYSTEMS (Negative unless checked)  Constitutional: '[]'$ Weight loss  '[]'$ Fever  '[]'$ Chills Cardiac: '[]'$ Chest pain   '[]'$ Chest pressure   '[]'$ Palpitations   '[]'$ Shortness of breath when laying flat   '[]'$ Shortness of breath with exertion. Vascular:  '[]'$ Pain in legs with walking   '[x]'$ Pain in legs with standing  '[]'$ History of DVT   '[]'$ Phlebitis   '[x]'$ Swelling in legs   '[]'$ Varicose veins   '[]'$ Non-healing ulcers Pulmonary:   '[]'$ Uses home oxygen   '[]'$ Productive cough   '[]'$ Hemoptysis   '[]'$ Wheeze  '[]'$ COPD   '[]'$ Asthma Neurologic:  '[]'$ Dizziness   '[]'$ Seizures   '[]'$ History of stroke   '[]'$ History of TIA  '[]'$ Aphasia   '[]'$ Vissual changes   '[]'$ Weakness or numbness in arm   '[]'$ Weakness or numbness in leg Musculoskeletal:   '[]'$ Joint swelling   '[]'$ Joint pain   '[]'$ Low back pain Hematologic:  '[]'$ Easy bruising  '[]'$ Easy bleeding   '[]'$ Hypercoagulable state   '[]'$ Anemic Gastrointestinal:  '[]'$ Diarrhea   '[]'$ Vomiting  '[]'$ Gastroesophageal reflux/heartburn   '[]'$ Difficulty swallowing. Genitourinary:  '[]'$ Chronic kidney disease   '[]'$ Difficult urination  '[]'$ Frequent urination   '[]'$ Blood in urine Skin:  '[]'$ Rashes   '[]'$ Ulcers  Psychological:  '[]'$ History of anxiety   '[]'$  History of major depression.  Physical Examination  There were no vitals filed for this visit. There is no height or weight on file to calculate BMI. Gen: WD/WN, NAD Head: [place]/AT, No temporalis wasting.  Ear/Nose/Throat: Hearing grossly intact, nares w/o erythema or drainage, pinna without lesions Eyes: PER, EOMI, sclera nonicteric.  Neck: Supple, no gross masses.  No JVD.  Pulmonary:  Good air movement, no audible wheezing, no use of accessory muscles.  Cardiac: RRR, precordium not hyperdynamic. Vascular:  scattered varicosities present bilaterally.  Moderate venous stasis changes to the legs bilaterally.  3+ soft pitting edema  Vessel  Right Left  Radial Palpable Palpable  Gastrointestinal: soft, non-distended. No guarding/no peritoneal signs.  Musculoskeletal: M/S 5/5 throughout.  No deformity.  Neurologic: CN 2-12 intact. Pain and light touch intact in extremities.  Symmetrical.  Speech is fluent. Motor exam as listed above. Psychiatric: Judgment intact, Mood & affect appropriate for pt's clinical situation. Dermatologic:  Venous rashes no ulcers noted.  No changes consistent with cellulitis. Lymph : No lichenification or skin changes of chronic lymphedema.  CBC Lab Results  Component Value Date   WBC 6.6 11/18/2021   HGB 13.3 11/18/2021   HCT 41.2 11/18/2021   MCV 94.3 11/18/2021   PLT 311 11/18/2021    BMET    Component Value Date/Time   NA 141 02/15/2022 1019   K 4.4 02/15/2022 1019   CL 106 02/15/2022 1019   CO2 29 02/15/2022 1019   GLUCOSE 105 (H) 02/15/2022 1019   BUN 29 (H) 02/15/2022 1019   CREATININE 0.74 02/15/2022 1019   CREATININE 0.77 11/18/2021 1510   CALCIUM 9.3 02/15/2022 1019   GFRNONAA >60 02/15/2022 1019   GFRAA >60 08/08/2018 0859   CrCl cannot be calculated (Patient's most recent lab result is older than the maximum 21 days allowed.).  COAG No results found for: "INR", "PROTIME"  Radiology No results found.   Assessment/Plan 1. Lymphedema Recommend:  No surgery or intervention at this point in time.    I have reviewed my discussion with the patient regarding lymphedema and why it  causes symptoms.  Patient will continue wearing graduated compression on a daily basis. The patient should put the compression on first thing in the morning and removing them in the evening. The patient should not sleep in the compression.   In addition, behavioral modification throughout the day will be continued.  This will include frequent elevation (such as in a recliner), use of over the counter pain medications as needed and exercise such as walking.  The systemic causes for chronic edema  such as liver, kidney and cardiac etiologies does not appear to have significant changed over the past year.    The patient will continue aggressive use of the  lymph pump.  Because this is important we will contact [employer] [facility] to see if we can have her machine evaluated and repaired.  This will continue to improve the edema control and prevent sequela such as ulcers and infections.   The patient will follow-up with me on an annual basis.    2. Arthritis, lumbar spine Continue NSAID medications as already ordered, these medications have been reviewed and there are no changes at this time.  Continued activity and therapy was stressed.   3. Hyperlipidemia, unspecified hyperlipidemia type Continue statin as ordered and reviewed, no changes at this time   4. Chronic diastolic heart failure (HCC) Continue cardiac and antihypertensive medications as already ordered and reviewed, no changes at this time.  Continue statin as ordered and reviewed, no changes at this time  Nitrates PRN for chest pain     Hortencia Pilar, MD  05/15/2022 11:24 AM

## 2022-05-16 ENCOUNTER — Telehealth (INDEPENDENT_AMBULATORY_CARE_PROVIDER_SITE_OTHER): Payer: Self-pay

## 2022-05-16 NOTE — Telephone Encounter (Signed)
[place] at [facility] was contacted due to the patient stating her compression pump is not working. This was received from [facility]:  M. C. [birth year]  HI Mickel Baas,  I received your voicemail for this patient and I am taking are of fixing her pump.  Just wanted to make you aware. Thanks!   [place]

## 2022-05-26 ENCOUNTER — Telehealth (INDEPENDENT_AMBULATORY_CARE_PROVIDER_SITE_OTHER): Payer: Self-pay | Admitting: Vascular Surgery

## 2022-05-26 NOTE — Telephone Encounter (Signed)
Patient called in stating that her lymp pump was sent back to [facility] and was told they would fix the machine and send it back ASAP. She still hasn't gotten the pump back and states she is in terrible pain. I gave patient number to [employer] and told her to reach back out to them to see what the ETA might be for the pump. Also talked with nurse and she advised patient to wear compressions and eluviate legs as much as possible. If pain doesn't go away please go to the ER or ED, we do not have any providers here in office and wouldn't be able to see her today. Patient stated she understood

## 2022-05-29 DIAGNOSIS — M1612 Unilateral primary osteoarthritis, left hip: Secondary | ICD-10-CM | POA: Diagnosis not present

## 2022-05-29 DIAGNOSIS — M160 Bilateral primary osteoarthritis of hip: Secondary | ICD-10-CM | POA: Diagnosis not present

## 2022-05-29 DIAGNOSIS — G8929 Other chronic pain: Secondary | ICD-10-CM | POA: Diagnosis not present

## 2022-05-29 DIAGNOSIS — M47816 Spondylosis without myelopathy or radiculopathy, lumbar region: Secondary | ICD-10-CM | POA: Diagnosis not present

## 2022-05-29 DIAGNOSIS — M25551 Pain in right hip: Secondary | ICD-10-CM | POA: Diagnosis not present

## 2022-05-29 DIAGNOSIS — R269 Unspecified abnormalities of gait and mobility: Secondary | ICD-10-CM | POA: Diagnosis not present

## 2022-05-29 DIAGNOSIS — T451X5A Adverse effect of antineoplastic and immunosuppressive drugs, initial encounter: Secondary | ICD-10-CM | POA: Diagnosis not present

## 2022-05-29 DIAGNOSIS — G620 Drug-induced polyneuropathy: Secondary | ICD-10-CM | POA: Diagnosis not present

## 2022-05-30 DIAGNOSIS — J060 Acute laryngopharyngitis: Secondary | ICD-10-CM | POA: Diagnosis not present

## 2022-05-30 DIAGNOSIS — R490 Dysphonia: Secondary | ICD-10-CM | POA: Diagnosis not present

## 2022-06-01 ENCOUNTER — Telehealth: Payer: Self-pay | Admitting: Urology

## 2022-06-01 NOTE — Telephone Encounter (Signed)
Pt called and is ready to have the PTNS scheduled.  Please call [place] at [phone]  Thank you

## 2022-06-02 NOTE — Telephone Encounter (Signed)
No PA required per [facility]. LMOM for pt to return call.

## 2022-06-05 NOTE — Telephone Encounter (Signed)
Patient returned the call and left a message on the triage line to call her back to schedule PTNS,

## 2022-06-07 NOTE — Telephone Encounter (Signed)
06/07/22 - Patient called and would like a call back to schedule PTNS - lmr.

## 2022-06-07 NOTE — Telephone Encounter (Signed)
Pt scheduled for 12 PTNS visits, pt confirmed.

## 2022-06-13 ENCOUNTER — Ambulatory Visit (INDEPENDENT_AMBULATORY_CARE_PROVIDER_SITE_OTHER): Payer: Medicare Other | Admitting: Pulmonary Disease

## 2022-06-13 ENCOUNTER — Encounter: Payer: Self-pay | Admitting: Pulmonary Disease

## 2022-06-13 VITALS — BP 130/80 | HR 77 | Temp 97.8°F | Ht 66.0 in | Wt 193.6 lb

## 2022-06-13 DIAGNOSIS — J4489 Other specified chronic obstructive pulmonary disease: Secondary | ICD-10-CM

## 2022-06-13 DIAGNOSIS — Z8543 Personal history of malignant neoplasm of ovary: Secondary | ICD-10-CM

## 2022-06-13 DIAGNOSIS — R0602 Shortness of breath: Secondary | ICD-10-CM

## 2022-06-13 DIAGNOSIS — I5032 Chronic diastolic (congestive) heart failure: Secondary | ICD-10-CM | POA: Diagnosis not present

## 2022-06-13 NOTE — Progress Notes (Unsigned)
Subjective:    Patient ID: Melissa Anderson, female    DOB: 1941/08/14, 81 y.o.   MRN: [id number] Patient Care Team: McLean-Scocuzza, Nino Glow, MD as PCP - General (Internal Medicine) End, Harrell Gave, MD as PCP - Cardiology (Cardiology) Gillis Ends, MD as Referring Physician (Obstetrics and Gynecology) Bary Castilla [place] Gleason, MD as Surgeon (General Surgery) Sindy Guadeloupe, MD as Consulting Physician (Oncology) Clent Jacks, RN as Oncology Nurse Navigator  Chief Complaint  Patient presents with   Follow-up    Asthma. No SOB or wheezing. Just treated for constant cough by ENT. Cough is gone now.     HPI Melissa Anderson is an 81 year old former smoker (39 PY) who presents for follow-up on the issue of asthmatic bronchitis and shortness of breath.  She was last evaluated here on 10 March 2022.  She has been doing well on Trelegy Ellipta and as needed levo albuterol.  She has not required use of the levo albuterol at all since her last visit.  She is being managed by cardiology for chronic diastolic heart failure, she is on furosemide and this has helped with her sensation of shortness of breath as well.  Her only complaint today is that she has lost her primary care physician as the physician left the practice.  She does not wish to have an NP or PA caring for her.  She has had no orthopnea or paroxysmal nocturnal dyspnea.  No increased lower extremity edema, this has actually been better since she started furosemide.  No chest pain, no calf tenderness.  No wheezing noted.  She did develop a cough over the last few weeks due to exacerbation of rhinosinusitis and was treated by ENT and her cough has now resolved.  Overall she feels well and looks well.   Review of Systems A 10 point review of systems was performed and it is as noted above otherwise negative.  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/11/2020   History of ovarian cancer 02/11/2020   Acute pain of right shoulder 08/15/2018   Guillain-Barre syndrome ([place]) 06/17/2018   Chemotherapy-induced neuropathy ([place]) 05/14/2018   Vitamin D deficiency 05/08/2018   Neuropathy 05/08/2018   Overactive bladder 05/08/2018   Bilateral leg edema 05/08/2018   HLD (hyperlipidemia) 05/08/2018   Goals of care, counseling/discussion 02/25/2018   Ovarian cancer ([place]) 02/22/2018   Graves' disease 02/13/2018   Social History   Tobacco Use   Smoking status: Former    Packs/day: 1.00    Years: 23.00    Total pack years: 23.00    Types: Cigarettes    Quit date: 1987    Years since quitting: 36.8   Smokeless tobacco: Never  Substance Use Topics   Alcohol use: Never   Allergies  Allergen Reactions   Novocain [Procaine] Palpitations   Crestor [Rosuvastatin]     Myopathy     Influenza Vaccines     ?GBS 05/2018    Lipitor [Atorvastatin Calcium]     Myalgias     Current Meds  Medication Sig   aspirin EC 81 MG tablet Take 81 mg by mouth daily.   finasteride (  PROSCAR) 5 MG tablet Take 5 mg by mouth daily.   Fluticasone-Umeclidin-Vilant (TRELEGY ELLIPTA) 100-62.5-25 MCG/ACT AEPB Inhale 1 puff into the lungs daily.   furosemide (LASIX) 20 MG tablet TAKE 1 TABLET BY MOUTH EVERY DAY   levalbuterol (XOPENEX HFA) 45 MCG/ACT inhaler Inhale 2 puffs into the lungs every 6 (six) hours as needed for wheezing or shortness of breath.   SYNTHROID 150 MCG tablet Take 1 tablet (150 mcg total) by mouth daily before breakfast. 30 min before food. You can take 150 mcg qd on Sundays 1.5 tablets d/c 175 mcg   Vibegron (GEMTESA) 75 MG TABS Take 75 mg by  mouth daily.   Vitamin D, Ergocalciferol, (DRISDOL) 1.25 MG (50000 UNIT) CAPS capsule TAKE 1 CAPSULE (50,000 UNITS TOTAL) BY MOUTH ONCE A WEEK. D3   Immunization History  Administered Date(s) Administered   Influenza, High Dose Seasonal PF 05/31/2018   Influenza-Unspecified 05/13/2018   PFIZER Comirnaty(Gray Top)Covid-19 Tri-[place] Vaccine 11/15/2020   PFIZER(Purple Top)SARS-COV-2 Vaccination 02/09/2020, 03/02/2020      Objective:   Physical Exam BP 130/80 (BP Location: Left Arm, Cuff Size: Normal)   Pulse 77   Temp 97.8 F (36.6 C)   Ht '5\' 6"'$  (1.676 m)   Wt 193 lb 9.6 oz (87.8 kg)   SpO2 94%   BMI 31.25 kg/m  GENERAL: Well-developed, well-nourished, looks younger than stated age.  Fully ambulatory.  Conversational dyspnea. HEAD: Normocephalic, atraumatic.  EYES: Pupils equal, round, reactive to light.  No scleral icterus.  MOUTH: Oral mucosa moist.  No thrush. NECK: Supple. No thyromegaly. Trachea midline. No JVD.  No adenopathy. PULMONARY: Good air entry bilaterally.  No adventitious sounds. CARDIOVASCULAR: S1 and S2. Regular rate and rhythm.  No rubs, murmurs or gallops heard. ABDOMEN: Benign. MUSCULOSKELETAL: No joint deformity, no clubbing, +1 edema .  Prominent varicose veins of the lower extremities. NEUROLOGIC: No overt focal deficit, no gait disturbance, speech is fluent. SKIN: Intact,warm,dry. PSYCH: Mood and behavior normal.     Assessment & Plan:     ICD-10-CM   1. Asthmatic bronchitis , chronic (HCC) - moderate  J44.89    Continue Trelegy Ellipta Continue as needed albuterol Follow-up 6 months    2. Chronic diastolic heart failure (HCC)  I50.32    NYHA class I-II Has noted improvements on furosemide Appears compensated Follows with cardiology This issue adds complexity to her management    3. SOB (shortness of breath)  R06.02    Markedly improved on Trelegy and furosemide Well compensated at present    4. History of ovarian cancer  Z85.43    She  is followed by oncology, Dr. Janese Banks No evidence of recurrence on last imaging     The patient has been referred to Dr. Deborra Medina who will set up the patient for appointment with regards to general internal medicine care.  She is to continue Trelegy and as needed albuterol.  We will see her in follow-up in 6 months time she is to contact us prior to that time should any new difficulties arise.  Renold Don, MD [facility]    *This note was dictated using voice recognition software/Dragon.  Despite best efforts to proofread, errors can occur which can change the meaning. Any transcriptional errors that result from this process are unintentional and may not be fully corrected at the time of dictation.

## 2022-06-13 NOTE — Patient Instructions (Signed)
Continue taking your Trelegy.  The office of Dr. Deborra Medina will be reaching out to you for an appointment.  She is at the same office Dr. Aundra Dubin was.  We will see you in follow-up in 6 months time.

## 2022-06-14 ENCOUNTER — Encounter: Payer: Self-pay | Admitting: Pulmonary Disease

## 2022-06-17 ENCOUNTER — Other Ambulatory Visit: Payer: Self-pay | Admitting: Internal Medicine

## 2022-06-26 ENCOUNTER — Encounter: Payer: Self-pay | Admitting: Physician Assistant

## 2022-06-26 ENCOUNTER — Encounter (INDEPENDENT_AMBULATORY_CARE_PROVIDER_SITE_OTHER): Payer: Self-pay

## 2022-06-26 ENCOUNTER — Ambulatory Visit (INDEPENDENT_AMBULATORY_CARE_PROVIDER_SITE_OTHER): Payer: Medicare Other | Admitting: Physician Assistant

## 2022-06-26 ENCOUNTER — Ambulatory Visit: Payer: 59 | Admitting: Physician Assistant

## 2022-06-26 VITALS — BP 115/62 | HR 80 | Ht 66.0 in | Wt 190.0 lb

## 2022-06-26 DIAGNOSIS — N3281 Overactive bladder: Secondary | ICD-10-CM | POA: Diagnosis not present

## 2022-06-26 NOTE — Patient Instructions (Signed)
Tracking Your Bladder Symptoms   Patient Name:___________________________________________________  Example: Day   Daytime Voids  Nighttime Voids Urgency for the Day (none, mild, strong, severe) Number of Accidents Beverage Comments  Monday IIII II Strong I Water IIII Coffee  I     Week Starting:____________________________________  Day Daytime  Voids Nighttime  Voids Urgency for the Day (none, mild, strong, severe) Number of Accidents Beverages Comments                                                           This week my symptoms were:  O much better O better O the same O worse

## 2022-06-26 NOTE — Progress Notes (Signed)
PTNS  Session # 1 of 12  Health & Social Factors: no change Caffeine: 1 Alcohol: 0 Daytime voids #per day: 3-4 Night-time voids #per night: 6 Urgency: strong Incontinence Episodes #per day: 12 Ankle used: right Treatment Setting: 7 Feeling/ Response: sensory Comments: Consent signed today. Significant bilateral LE pitting edema; I asked her to wear compressive socks or use compressive devices prior to treatments. She is mostly bothered by her nocturia and I think her BLE edema is playing a large role in this. Placed the needle a bit more posteriorly to produce sensory response.  Performed By: Debroah Loop, PA-C   Follow Up: 1 week for PTNS #2 of 12

## 2022-06-29 ENCOUNTER — Ambulatory Visit: Payer: Medicare Other | Admitting: Physician Assistant

## 2022-07-03 ENCOUNTER — Ambulatory Visit (INDEPENDENT_AMBULATORY_CARE_PROVIDER_SITE_OTHER): Payer: Medicare Other | Admitting: Physician Assistant

## 2022-07-03 VITALS — Ht 66.0 in | Wt 190.0 lb

## 2022-07-03 DIAGNOSIS — N3281 Overactive bladder: Secondary | ICD-10-CM | POA: Diagnosis not present

## 2022-07-03 NOTE — Progress Notes (Signed)
PTNS  Session # 2  Health & Social Factors: no change Caffeine: 4 Alcohol: 0 Daytime voids #per day: 4 Night-time voids #per night: 3 Urgency: strong  Incontinence Episodes #per day: continuous Ankle used: right Treatment Setting: 14 Feeling/ Response: sensory Comments: BLE edema slightly improved over prior. I recommended she use compression socks on the day of treatments; I had to compress the tissue around her ankle to reduce her edema and allow for sufficient needle depth.  Performed By: Debroah Loop, PA-C   Follow Up: 1 week follow up 3 of 12

## 2022-07-03 NOTE — Patient Instructions (Signed)
Tracking Your Bladder Symptoms   Patient Name:___________________________________________________  Example: Day   Daytime Voids  Nighttime Voids Urgency for the Day (none, mild, strong, severe) Number of Accidents Beverage Comments  Monday IIII II Strong I Water IIII Coffee  I     Week Starting:____________________________________  Day Daytime  Voids Nighttime  Voids Urgency for the Day (none, mild, strong, severe) Number of Accidents Beverages Comments                                                           This week my symptoms were:  O much better O better O the same O worse

## 2022-07-05 ENCOUNTER — Telehealth: Payer: Self-pay | Admitting: Family

## 2022-07-05 DIAGNOSIS — E559 Vitamin D deficiency, unspecified: Secondary | ICD-10-CM

## 2022-07-10 ENCOUNTER — Encounter: Payer: Self-pay | Admitting: Urology

## 2022-07-10 ENCOUNTER — Ambulatory Visit: Payer: 59 | Admitting: Physician Assistant

## 2022-07-10 ENCOUNTER — Ambulatory Visit (INDEPENDENT_AMBULATORY_CARE_PROVIDER_SITE_OTHER): Payer: Medicare Other | Admitting: Urology

## 2022-07-10 VITALS — BP 135/72 | HR 86 | Ht 66.0 in | Wt 194.0 lb

## 2022-07-10 DIAGNOSIS — N3281 Overactive bladder: Secondary | ICD-10-CM

## 2022-07-10 NOTE — Progress Notes (Signed)
07/10/2022 9:32 AM   Melissa Anderson 1940-10-17 [id number]  Referring provider: McLean-Scocuzza, Nino Glow, MD No address on file  No chief complaint on file.   HPI: Consulted to assess the patient's urinary incontinence worsening over many months.  She is living here locally with her daughter who is an oncologist to have chemotherapy for stage II ovarian cancer.  She lives in [place].   She describes mild urge incontinence during the day and wears 1 pad.  She describes bedwetting and foot on the floor syndrome.  She is currently on Vesicare 5 mg as the only medication tried for her overactive bladder.  She denies stress incontinence.  She can use 3 pads at night for higher volume leakage     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  The role of combination therapy with Myrbetriq 50 mg with the Vesicare described.  She did well I would stop the Vesicare.  She thinks the Vesicare 5 mg may be helping some and we spoke about increased dosing.  Reassess in 5 weeks on double therapy and stop Vesicare if patient doing well   I have not seen the patient since 2019.  Patient recently saw a nurse practitioner.  She had failed Vesicare and oxybutynin and had a recent positive urinary tract infection.  It appears she failed Gemtesa.  She was given Myrbetriq.  She has had 1 positive and one negative culture in the last several months   Patient down to 1 pad a day that is damp.  She does quite well with urge incontinence during the day and no stress incontinence.  She might have some bedwetting but she definitely has foot on the floor syndrome in the middle the night.  The new beta 3 agonist Logan Bores is helping her in improving her quality life.  She has failed Vesicare and oxybutynin and it appears she has failed Myrbetriq.  She would like to stay on the medication.  Review percutaneous tibial nerve stimulation in detail and gave her handout    Continue with Myrbetriq with  improved quality life and moderate effectiveness refractory other medications.  Handout given.  She will call if she wishes to proceed.  She might wait to January because of her travel to [place] to see her daughter.  Reassess in 1 year   To clarify patient is on Myrbetriq and she will stay on this.  Today  Patient saw my partner March 2023.  It looks like she was on Gemtesa but it failed many medications noted.  Botox has been discussed as well as percutaneous tibial nerve stimulation in the past.  It appears the patient started percutaneous tibial nerve stimulation with the last treatment July 03, 2022 which was her second treatment.  Patient is here to have her third treatment.  She continues to leak a lot when she goes from a sitting to standing position especially at night.  She has been trying a pure wick device.  She is using compression hose and Lasix for leg edema.  She failed Myrbetriq and Gemtesa and other medications noted.  Again she does not want Botox.  We talked about the pathophysiology of her incontinence and why she does not need a bladder suspension.  We spent a few minutes discussing this.     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[place])    2/2 flu shot 05/2018,  intubated x 8 days   Hyperlipidemia    Lymphedema    Overactive bladder    Thyroid disease    UTI (urinary tract infection)    Vitamin D deficiency     Surgical History: Past Surgical History:  Procedure Laterality Date   ABDOMINAL HYSTERECTOMY     02/15/18    CHOLECYSTECTOMY  2008   LAPAROSCOPIC VAGINAL HYSTERECTOMY WITH SALPINGO OOPHORECTOMY  02/15/2018   PORTACATH PLACEMENT Left 02/25/2018   Procedure: INSERTION PORT-A-CATH;  Surgeon: Robert Bellow, MD;  Location: [facility] ORS;  Service: General;  Laterality: Left;   TOTAL ABDOMINAL HYSTERECTOMY W/ BILATERAL SALPINGOOPHORECTOMY  02/15/2018   lymph nodes removed    Home Medications:  Allergies as of 07/10/2022       Reactions   Novocain [procaine] Palpitations   Crestor [rosuvastatin]    Myopathy    Influenza Vaccines    ?GBS 05/2018   Lipitor [atorvastatin Calcium]    Myalgias         Medication List        Accurate as of July 10, 2022  9:32 AM. If you have any questions, ask your nurse or doctor.          aspirin EC 81 MG tablet Take 81 mg by mouth daily.   finasteride 5 MG tablet Commonly known as: PROSCAR Take 5 mg by mouth daily.   furosemide 20 MG tablet Commonly known as: LASIX TAKE 1 TABLET BY MOUTH EVERY DAY   Gemtesa 75 MG Tabs Generic drug: Vibegron Take 75 mg by mouth daily.   levalbuterol 45 MCG/ACT inhaler Commonly known as: Xopenex HFA Inhale 2 puffs into the lungs every 6 (six) hours as needed for wheezing or shortness of breath.   metoprolol tartrate 100 MG tablet Commonly known as: Lopressor Take 1 tablet (100 mg total) by mouth once for 1 dose. Take TWO hours prior to CT procedure   Synthroid 150 MCG tablet Generic drug: levothyroxine Take 1 tablet (150 mcg total) by mouth daily before breakfast. 30 min before food. You can take 150 mcg qd on Sundays 1.5 tablets d/c 175 mcg   Trelegy Ellipta 100-62.5-25 MCG/ACT Aepb Generic drug: Fluticasone-Umeclidin-Vilant Inhale 1 puff into the lungs daily.   Vitamin D (Ergocalciferol) 1.25 MG (50000 UNIT) Caps capsule Commonly known as: DRISDOL TAKE 1 CAPSULE (50,000 UNITS TOTAL) BY MOUTH ONCE A WEEK. D3        Allergies:  Allergies  Allergen Reactions   Novocain [Procaine] Palpitations   Crestor [Rosuvastatin]     Myopathy     Influenza Vaccines     ?GBS 05/2018    Lipitor [Atorvastatin Calcium]     Myalgias      Family History: Family History  Problem Relation Age of  Onset   Sudden death Mother 53       drowning in hurricane Katrina   Pancreatic cancer Father        deceased early 35s; smoker   Alcohol abuse Father    COPD Father    Melanoma Sister        deceased at 61   COPD Sister    Heart disease Brother        Angina   Coronary artery disease Brother    Heart disease Brother  Heart murmur   Prostate cancer Maternal Uncle        deceased 2   Breast cancer Paternal Aunt        unsure of this dx    Social History:  reports that she quit smoking about 36 years ago. Her smoking use included cigarettes. She has a 23.00 pack-year smoking history. She has never used smokeless tobacco. She reports that she does not drink alcohol and does not use drugs.  ROS:                                        Physical Exam: There were no vitals taken for this visit.  Constitutional:  Alert and oriented, No acute distress. HEENT: [place] AT, moist mucus membranes.  Trachea midline, no masses.   Laboratory Data: Lab Results  Component Value Date   WBC 6.6 11/18/2021   HGB 13.3 11/18/2021   HCT 41.2 11/18/2021   MCV 94.3 11/18/2021   PLT 311 11/18/2021    Lab Results  Component Value Date   CREATININE 0.74 02/15/2022    No results found for: "PSA"  No results found for: "TESTOSTERONE"  Lab Results  Component Value Date   HGBA1C 5.3 05/09/2018    Urinalysis    Component Value Date/Time   COLORURINE YELLOW 10/04/2021 1239   APPEARANCEUR Cloudy (A) 11/08/2021 1337   LABSPEC 1.021 10/04/2021 1239   PHURINE < OR = 5.0 10/04/2021 1239   GLUCOSEU Negative 11/08/2021 1337   HGBUR NEGATIVE 10/04/2021 1239   BILIRUBINUR Negative 11/08/2021 1337   KETONESUR NEGATIVE 10/04/2021 1239   PROTEINUR Negative 11/08/2021 1337   PROTEINUR NEGATIVE 10/04/2021 1239   NITRITE Negative 11/08/2021 1337   NITRITE NEGATIVE 10/04/2021 1239   LEUKOCYTESUR 2+ (A) 11/08/2021 1337   LEUKOCYTESUR 2+ (A) 10/04/2021 1239    Pertinent  Imaging:   Assessment & Plan: Patient primarily has an overactive bladder.  Continue with percutaneous tibial nerve stimulation.  Her incontinence is worsening and hopefully this will greatly improve her symptoms.  Nighttime symptoms likely worse from nocturnal diuresis.  There are no diagnoses linked to this encounter.  No follow-ups on file.  Reece Packer, MD  [facility] [address], [place], [place] [address] [phone]

## 2022-07-10 NOTE — Progress Notes (Signed)
Patient ID: Melissa Anderson, female   DOB: 1941-01-29, 81 y.o.   MRN: [id number] PTNS  Session # 3 of 12  Health & Social Factors: no chnge Caffeine: 3 Alcohol: 0 Daytime voids #per day: 4 Night-time voids #per night: 3 Urgency: mild Incontinence Episodes #per day: freq Ankle used: right Treatment Setting: 15 Feeling/ Response: both Comments: pt tolerated well  Performed By: Edwin Dada, CMA  Follow Up: 1 week for #4 of 12 .

## 2022-07-10 NOTE — Addendum Note (Signed)
Addended by: Despina Hidden on: 07/10/2022 10:23 AM   Modules accepted: Orders

## 2022-07-12 ENCOUNTER — Ambulatory Visit: Payer: Medicare Other | Admitting: Podiatry

## 2022-07-17 ENCOUNTER — Other Ambulatory Visit: Payer: Self-pay | Admitting: Family

## 2022-07-17 ENCOUNTER — Ambulatory Visit (INDEPENDENT_AMBULATORY_CARE_PROVIDER_SITE_OTHER): Payer: Medicare Other | Admitting: Physician Assistant

## 2022-07-17 DIAGNOSIS — N3281 Overactive bladder: Secondary | ICD-10-CM

## 2022-07-17 DIAGNOSIS — E559 Vitamin D deficiency, unspecified: Secondary | ICD-10-CM

## 2022-07-17 MED ORDER — VITAMIN D (ERGOCALCIFEROL) 1.25 MG (50000 UNIT) PO CAPS: 50000.0000 [IU] | ORAL_CAPSULE | ORAL | 1 refills | Status: DC

## 2022-07-17 NOTE — Patient Instructions (Signed)
Tracking Your Bladder Symptoms   Patient Name:___________________________________________________  Example: Day   Daytime Voids  Nighttime Voids Urgency for the Day (none, mild, strong, severe) Number of Accidents Beverage Comments  Monday IIII II Strong I Water IIII Coffee  I     Week Starting:____________________________________  Day Daytime  Voids Nighttime  Voids Urgency for the Day (none, mild, strong, severe) Number of Accidents Beverages Comments                                                           This week my symptoms were:  O much better O better O the same O worse

## 2022-07-17 NOTE — Progress Notes (Signed)
PTNS  Session # 4 of 12  Health & Social Factors: no change Caffeine: 3-4 Alcohol: 0 Daytime voids #per day: 4 Night-time voids #per night: 3 Urgency: mild Incontinence Episodes #per day: continuous Ankle used: right Treatment Setting: 11 Feeling/ Response: sensory (sole and needle site) Comments: Patient tolerated well, wearing compression socks today.  Performed By: Debroah Loop, PA-C   Follow Up: 1 week for PTNS #5

## 2022-07-24 ENCOUNTER — Ambulatory Visit: Payer: 59 | Admitting: Physician Assistant

## 2022-07-28 ENCOUNTER — Ambulatory Visit: Payer: 59 | Admitting: Physician Assistant

## 2022-07-31 ENCOUNTER — Ambulatory Visit (INDEPENDENT_AMBULATORY_CARE_PROVIDER_SITE_OTHER): Payer: Medicare Other | Admitting: Physician Assistant

## 2022-07-31 DIAGNOSIS — N3281 Overactive bladder: Secondary | ICD-10-CM

## 2022-07-31 NOTE — Patient Instructions (Signed)
Tracking Your Bladder Symptoms   Patient Name:___________________________________________________  Example: Day   Daytime Voids  Nighttime Voids Urgency for the Day (none, mild, strong, severe) Number of Accidents Beverage Comments  Monday IIII II Strong I Water IIII Coffee  I     Week Starting:____________________________________  Day Daytime  Voids Nighttime  Voids Urgency for the Day (none, mild, strong, severe) Number of Accidents Beverages Comments                                                           This week my symptoms were:  O much better O better O the same O worse

## 2022-07-31 NOTE — Progress Notes (Signed)
PTNS  Session # 5 of 12  Health & Social Factors: She ran out of [place] and it is on backorder at her pharmacy; she has been off it 4 days and has noticed worsening symptoms. She also travelled recently and skipped her diuretics during that time; she has significantly increased BLE edema on exam today. Caffeine: 3 Alcohol: 0 Daytime voids #per day: 4 Night-time voids #per night: unknown, uses PureWick Urgency: mild Incontinence Episodes #per day: continuous Ankle used: right Treatment Setting: 7 Feeling/ Response: sensory Comments: Patient tolerated well. We had a lengthy conversation regarding her treatment options. I reminded her that it can take up to 12 PTNS treatments to notice a difference but I think her Gemtesa and BLE edema are playing a large role here. We discussed maintaining realistic treatment goals and that we are unlikely to get her completely dry. I'm giving her 2 weeks of Gemtesa samples today to last her until her pharmacy restocks it.  Performed By: Debroah Loop, PA-C   Follow Up: 1 week for PTNS #6

## 2022-08-01 DIAGNOSIS — C44311 Basal cell carcinoma of skin of nose: Secondary | ICD-10-CM | POA: Diagnosis not present

## 2022-08-07 ENCOUNTER — Ambulatory Visit (INDEPENDENT_AMBULATORY_CARE_PROVIDER_SITE_OTHER): Payer: Medicare Other | Admitting: Physician Assistant

## 2022-08-07 DIAGNOSIS — N3281 Overactive bladder: Secondary | ICD-10-CM

## 2022-08-07 NOTE — Progress Notes (Signed)
PTNS  Session # 6 of 12  Health & Social Factors: No change, back on Gemtesa Caffeine: 2 Alcohol: 0 Daytime voids #per day: 5 Night-time voids #per night: 3 Urgency: mild Incontinence Episodes #per day: continuous Ankle used: right Treatment Setting: 10 Feeling/ Response: sensory Comments: Patient tolerated well.  Performed By: Debroah Loop, PA-C   Follow Up: 1 week for PTNS #7 of 12

## 2022-08-09 ENCOUNTER — Ambulatory Visit: Payer: Medicare Other | Admitting: Podiatry

## 2022-08-14 ENCOUNTER — Ambulatory Visit (INDEPENDENT_AMBULATORY_CARE_PROVIDER_SITE_OTHER): Payer: Medicare Other | Admitting: Physician Assistant

## 2022-08-14 DIAGNOSIS — N3281 Overactive bladder: Secondary | ICD-10-CM | POA: Diagnosis not present

## 2022-08-14 NOTE — Progress Notes (Signed)
PTNS  Session # 7 of 12  Health & Social Factors: no change Caffeine: 4 Alcohol: 0 Daytime voids #per day: 6 Night-time voids #per night: 6 Urgency: mild Incontinence Episodes #per day: continuous Ankle used: right Treatment Setting: 19 Feeling/ Response: sensory Comments: Patient tolerated well. Persistent, marked BLE edema.  Performed By: Debroah Loop, PA-C   Follow Up: 1 week for PTNS #8

## 2022-08-16 ENCOUNTER — Ambulatory Visit: Payer: Medicare Other | Admitting: Podiatry

## 2022-08-23 ENCOUNTER — Ambulatory Visit: Payer: Medicare Other | Admitting: Physician Assistant

## 2022-08-29 ENCOUNTER — Ambulatory Visit (INDEPENDENT_AMBULATORY_CARE_PROVIDER_SITE_OTHER): Payer: Medicare Other | Admitting: Physician Assistant

## 2022-08-29 DIAGNOSIS — N3281 Overactive bladder: Secondary | ICD-10-CM

## 2022-08-29 NOTE — Progress Notes (Signed)
PTNS   Session # 8 of 12   Health & Social Factors: no change Caffeine: 4 Alcohol: 0 Daytime voids #per day: 6 Night-time voids #per night: 6 Urgency: mild Incontinence Episodes #per day: continuous Ankle used: right Treatment Setting: 19 Feeling/ Response: sensory Comments: Patient tolerated well. Persistent, marked BLE edema.   Performed By:   Follow Up: 1 week for PTNS #9

## 2022-08-29 NOTE — Progress Notes (Signed)
PTNS  Session # 8 of 12  Health & Social Factors: no change Caffeine: 3 Alcohol: 0 Daytime voids #per day: 5 Night-time voids #per night: 2 Urgency: none Incontinence Episodes #per day: 0 Ankle used: right Treatment Setting: 12 Feeling/ Response: sensory Comments: Patient reports she is starting to see improvement in her symptoms. She has significantly less BLE edema this week, she denies any recent changes to account for this.  Performed By: Debroah Loop, PA-C   Follow Up: 1 week for PTNS #9

## 2022-08-29 NOTE — Patient Instructions (Signed)
Tracking Your Bladder Symptoms   Patient Name:___________________________________________________  Example: Day   Daytime Voids  Nighttime Voids Urgency for the Day (none, mild, strong, severe) Number of Accidents/ Leaks Beverage Comments  Monday IIII II Strong I Water IIII Coffee  I     Week Starting:____________________________________  Day Daytime  Voids Nighttime  Voids Urgency for the Day (none, mild, strong, severe) Number of Accidents/ Leaks Beverages Comments                                                           This week my symptoms were:  O much better O better O the same O worse   

## 2022-09-04 ENCOUNTER — Ambulatory Visit (INDEPENDENT_AMBULATORY_CARE_PROVIDER_SITE_OTHER): Payer: Medicare Other | Admitting: Physician Assistant

## 2022-09-04 DIAGNOSIS — N3281 Overactive bladder: Secondary | ICD-10-CM

## 2022-09-04 NOTE — Patient Instructions (Addendum)
Tracking Your Bladder Symptoms   Patient Name:___________________________________________________  Example: Day   Daytime Voids  Nighttime Voids Urgency for the Day (none, mild, strong, severe) Number of Accidents/ Leaks Beverage Comments  Monday IIII II Strong I Water IIII Coffee  I     Week Starting:____________________________________  Day Daytime  Voids Nighttime  Voids Urgency for the Day (none, mild, strong, severe) Number of Accidents/ Leaks Beverages Comments                                                           This week my symptoms were:  O much better O better O the same O worse   

## 2022-09-04 NOTE — Progress Notes (Signed)
PTNS  Session # 9 of 12  Health & Social Factors: skipped meds x2 days, noticed symptom worsening esp with nocturia Caffeine: 3 Alcohol: 0 Daytime voids #per day: 4 Night-time voids #per night: 6-7 Urgency: mild Incontinence Episodes #per day: no longer continuous, sometimes none Ankle used: right Treatment Setting: 12 Feeling/ Response: sensory Comments: Patient tolerated well. Continued stable improvement in BLE edema.  Performed By: Debroah Loop, PA-C   Follow Up: 1 week for PTNS #10 of 12

## 2022-09-11 ENCOUNTER — Ambulatory Visit: Payer: 59 | Admitting: Physician Assistant

## 2022-09-11 ENCOUNTER — Encounter: Payer: Self-pay | Admitting: Physician Assistant

## 2022-09-12 ENCOUNTER — Ambulatory Visit (INDEPENDENT_AMBULATORY_CARE_PROVIDER_SITE_OTHER): Payer: Medicare Other | Admitting: Physician Assistant

## 2022-09-12 DIAGNOSIS — N3281 Overactive bladder: Secondary | ICD-10-CM

## 2022-09-12 NOTE — Progress Notes (Signed)
PTNS  Session # 10 of 12  Health & Social Factors: no change Caffeine: 3 Alcohol: 0 Daytime voids #per day: 4 Night-time voids #per night: 1-2 Urgency: mild Incontinence Episodes #per day: only in the AM Ankle used: right Treatment Setting: 19 Feeling/ Response: sensory Comments: Patient tolerated well. Significant improvement in nocturia this week.  Performed By: Debroah Loop, PA-C  Follow Up: 1 week for PTNS #11 of 12

## 2022-09-15 ENCOUNTER — Ambulatory Visit: Payer: Medicare Other | Admitting: Nurse Practitioner

## 2022-09-15 DIAGNOSIS — G620 Drug-induced polyneuropathy: Secondary | ICD-10-CM | POA: Diagnosis not present

## 2022-09-15 DIAGNOSIS — T24211A Burn of second degree of right thigh, initial encounter: Secondary | ICD-10-CM | POA: Diagnosis not present

## 2022-09-15 DIAGNOSIS — T451X5A Adverse effect of antineoplastic and immunosuppressive drugs, initial encounter: Secondary | ICD-10-CM | POA: Diagnosis not present

## 2022-09-18 ENCOUNTER — Ambulatory Visit (INDEPENDENT_AMBULATORY_CARE_PROVIDER_SITE_OTHER): Payer: Medicare Other | Admitting: Urology

## 2022-09-18 ENCOUNTER — Ambulatory Visit: Payer: 59 | Admitting: Physician Assistant

## 2022-09-18 DIAGNOSIS — N3946 Mixed incontinence: Secondary | ICD-10-CM

## 2022-09-18 NOTE — Patient Instructions (Signed)
Tracking Your Bladder Symptoms   Patient Name:___________________________________________________  Example: Day   Daytime Voids  Nighttime Voids Urgency for the Day (none, mild, strong, severe) Number of Accidents/ Leaks Beverage Comments  Monday IIII II Strong I Water IIII Coffee  I     Week Starting:____________________________________  Day Daytime  Voids Nighttime  Voids Urgency for the Day (none, mild, strong, severe) Number of Accidents/ Leaks Beverages Comments                                                           This week my symptoms were:  O much better O better O the same O worse   

## 2022-09-18 NOTE — Progress Notes (Addendum)
PTNS  Session # 11 of 12  Health & Social Factors: no charge Caffeine: 3 Alcohol: 0 Daytime voids #per day: 5 Night-time voids #per night: 2 Urgency: mild Incontinence Episodes #per day: 0 Ankle used: right Treatment Setting: 13 Feeling/ Response: sensory Comments: patient tolerated well  Performed By: Elberta Leatherwood, CMA  Follow Up: 1 week #12

## 2022-09-20 ENCOUNTER — Encounter: Payer: Self-pay | Admitting: Podiatry

## 2022-09-20 ENCOUNTER — Ambulatory Visit (INDEPENDENT_AMBULATORY_CARE_PROVIDER_SITE_OTHER): Payer: Medicare Other | Admitting: Podiatry

## 2022-09-20 DIAGNOSIS — D2372 Other benign neoplasm of skin of left lower limb, including hip: Secondary | ICD-10-CM | POA: Diagnosis not present

## 2022-09-20 DIAGNOSIS — B351 Tinea unguium: Secondary | ICD-10-CM | POA: Diagnosis not present

## 2022-09-20 DIAGNOSIS — M79672 Pain in left foot: Secondary | ICD-10-CM | POA: Diagnosis not present

## 2022-09-20 DIAGNOSIS — M79676 Pain in unspecified toe(s): Secondary | ICD-10-CM

## 2022-09-20 DIAGNOSIS — D2371 Other benign neoplasm of skin of right lower limb, including hip: Secondary | ICD-10-CM

## 2022-09-20 NOTE — Progress Notes (Signed)
She presents today chief complaint of painful elongated toenails and calluses.  Objective: Vital signs are stable alert and oriented x 3.  Pulses are palpable.  No erythema edema cellulitis drainage or odor toenails are long thick yellow dystrophic onychomycotic hammertoe deformities are noted bilateral hallux valgus deformities noted bilateral.  Calluses along the plantar and plantar medial aspect of the first metatarsophalangeal joints.  No open lesions or wounds.  Assessment: Pain in limb secondary to onychomycosis and benign skin lesions.  Plan: Debridement of benign skin lesions debridement of nails 1 through 5 bilateral.

## 2022-09-22 ENCOUNTER — Encounter: Payer: Self-pay | Admitting: Pulmonary Disease

## 2022-09-25 ENCOUNTER — Ambulatory Visit: Payer: Medicare Other | Admitting: Physician Assistant

## 2022-09-25 ENCOUNTER — Ambulatory Visit (INDEPENDENT_AMBULATORY_CARE_PROVIDER_SITE_OTHER): Payer: Medicare Other | Admitting: Physician Assistant

## 2022-09-25 DIAGNOSIS — N3281 Overactive bladder: Secondary | ICD-10-CM

## 2022-09-25 NOTE — Progress Notes (Signed)
PTNS   Session # 12 of 12   Health & Social Factors: no charge Caffeine: 3 Alcohol: 0 Daytime voids #per day: 5 Night-time voids #per night: 2 Urgency: mild Incontinence Episodes #per day: 0 Ankle used: right Treatment Setting: 9 Feeling/ Response: sensory Comments: patient tolerated well   Performed By: Gaspar Cola  CMA   Follow up one month

## 2022-10-10 ENCOUNTER — Telehealth: Payer: Self-pay | Admitting: General Practice

## 2022-10-10 NOTE — Telephone Encounter (Signed)
Patient called and she needs a referral for a mammogram. She does not want to do a TOC at this office, she is going to go somewhere else to establish care.

## 2022-10-13 ENCOUNTER — Ambulatory Visit
Admission: RE | Admit: 2022-10-13 | Discharge: 2022-10-13 | Disposition: A | Discharge status: Home / Self Care | Payer: Medicare Other | Source: Ambulatory Visit | Attending: Obstetrics and Gynecology | Admitting: Obstetrics and Gynecology

## 2022-10-13 DIAGNOSIS — C561 Malignant neoplasm of right ovary: Secondary | ICD-10-CM | POA: Insufficient documentation

## 2022-10-13 DIAGNOSIS — J9811 Atelectasis: Secondary | ICD-10-CM | POA: Diagnosis not present

## 2022-10-13 DIAGNOSIS — N8189 Other female genital prolapse: Secondary | ICD-10-CM | POA: Diagnosis not present

## 2022-10-13 DIAGNOSIS — K449 Diaphragmatic hernia without obstruction or gangrene: Secondary | ICD-10-CM | POA: Diagnosis not present

## 2022-10-13 DIAGNOSIS — C569 Malignant neoplasm of unspecified ovary: Secondary | ICD-10-CM | POA: Diagnosis not present

## 2022-10-13 DIAGNOSIS — N8110 Cystocele, unspecified: Secondary | ICD-10-CM | POA: Diagnosis not present

## 2022-10-13 DIAGNOSIS — J8410 Pulmonary fibrosis, unspecified: Secondary | ICD-10-CM | POA: Diagnosis not present

## 2022-10-13 DIAGNOSIS — J984 Other disorders of lung: Secondary | ICD-10-CM | POA: Diagnosis not present

## 2022-10-13 LAB — POCT I-STAT CREATININE: Creatinine, Ser: 0.8 mg/dL (ref 0.44–1.00)

## 2022-10-13 MED ORDER — IOHEXOL 300 MG/ML  SOLN
100.0000 mL | Freq: Once | INTRAMUSCULAR | Status: AC | PRN
Start: 2022-10-13 — End: 2022-10-13
  Administered 2022-10-13: 100 mL via INTRAVENOUS

## 2022-10-13 NOTE — Telephone Encounter (Signed)
I would say she needs a new PCP to place due to if mammogram were to comeback abnormal then she would need further testing and etc..Marland Kitchen

## 2022-10-16 NOTE — Telephone Encounter (Signed)
LMTCB

## 2022-10-16 NOTE — Telephone Encounter (Signed)
Pt is aware that we cannot order mammogram since she has not established with a new provider in this office.  She verbalized that her pulmonologist Dr. Patsey Berthold recommended someone in our office who agreed to take her as a pt but could not remember the name.  I let her know that if  the provider agreed to take her as a pt we could make an appointment but at this time none of our internal medicine physicians are accepting new pts.

## 2022-10-16 NOTE — Telephone Encounter (Signed)
Pt returning call

## 2022-10-18 ENCOUNTER — Inpatient Hospital Stay: Payer: Medicare Other | Attending: Oncology

## 2022-10-18 ENCOUNTER — Inpatient Hospital Stay (HOSPITAL_BASED_OUTPATIENT_CLINIC_OR_DEPARTMENT_OTHER): Payer: Medicare Other | Admitting: Nurse Practitioner

## 2022-10-18 VITALS — BP 138/88 | HR 73 | Temp 98.6°F | Resp 19 | Wt 193.6 lb

## 2022-10-18 DIAGNOSIS — N993 Prolapse of vaginal vault after hysterectomy: Secondary | ICD-10-CM | POA: Diagnosis not present

## 2022-10-18 DIAGNOSIS — Z08 Encounter for follow-up examination after completed treatment for malignant neoplasm: Secondary | ICD-10-CM | POA: Diagnosis not present

## 2022-10-18 DIAGNOSIS — I890 Lymphedema, not elsewhere classified: Secondary | ICD-10-CM | POA: Insufficient documentation

## 2022-10-18 DIAGNOSIS — Z87891 Personal history of nicotine dependence: Secondary | ICD-10-CM | POA: Diagnosis not present

## 2022-10-18 DIAGNOSIS — Z9221 Personal history of antineoplastic chemotherapy: Secondary | ICD-10-CM | POA: Insufficient documentation

## 2022-10-18 DIAGNOSIS — Z8543 Personal history of malignant neoplasm of ovary: Secondary | ICD-10-CM | POA: Insufficient documentation

## 2022-10-18 DIAGNOSIS — Z9079 Acquired absence of other genital organ(s): Secondary | ICD-10-CM | POA: Insufficient documentation

## 2022-10-18 DIAGNOSIS — Z1231 Encounter for screening mammogram for malignant neoplasm of breast: Secondary | ICD-10-CM | POA: Diagnosis not present

## 2022-10-18 DIAGNOSIS — Z90710 Acquired absence of both cervix and uterus: Secondary | ICD-10-CM | POA: Diagnosis not present

## 2022-10-18 DIAGNOSIS — R32 Unspecified urinary incontinence: Secondary | ICD-10-CM | POA: Insufficient documentation

## 2022-10-18 DIAGNOSIS — M1611 Unilateral primary osteoarthritis, right hip: Secondary | ICD-10-CM | POA: Diagnosis not present

## 2022-10-18 DIAGNOSIS — K449 Diaphragmatic hernia without obstruction or gangrene: Secondary | ICD-10-CM | POA: Insufficient documentation

## 2022-10-18 DIAGNOSIS — C561 Malignant neoplasm of right ovary: Secondary | ICD-10-CM

## 2022-10-18 DIAGNOSIS — Z90722 Acquired absence of ovaries, bilateral: Secondary | ICD-10-CM | POA: Insufficient documentation

## 2022-10-18 NOTE — Patient Instructions (Signed)
Dr. Ola Spurr for internal medicine [facility], Dr Laurence Ferrari for annual skin exam We'll see you in 6 months.
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
## 2022-10-20 ENCOUNTER — Ambulatory Visit: Payer: Medicare Other | Admitting: Internal Medicine

## 2022-10-20 LAB — CA 125: Cancer Antigen (CA) 125: 31.1 U/mL (ref 0.0–38.1)

## 2022-10-20 NOTE — Progress Notes (Deleted)
Follow-up Outpatient Visit Date: 10/20/2022  Primary Care Provider: Pcp, No No address on file  Chief Complaint: ***  HPI:  Melissa Anderson is a 82 y.o. female with history of aortic atherosclerosis, chronic HFpEF, hyperlipidemia, asthmatic bronchitis, ovarian cancer, Guillain-Barr syndrome, lymphedema, and hypothyroidism, who presents for follow-up of dyspnea on exertion.  I last saw her in July, at which time she reported feeling better with less shortness of breath since starting furosemide.  We had previously discussed performing a myocardial perfusion stress test to exclude underlying ischemia, though [name] elected to cancel the test due to concerns about reactions to regadenoson.  We therefore agreed to perform a coronary CTA, which showed normal coronary arteries without calcium.  Chronic right middle lobe atelectasis as well as bilateral lower lobe scarring was noted.  --------------------------------------------------------------------------------------------------  Cardiovascular History & Procedures: Cardiovascular Problems: Dyspnea on exertion   Risk Factors: Aortic atherosclerosis, hyperlipidemia, obesity, and age greater than 3   Cath/PCI: None   CV Surgery: None   EP Procedures and Devices: None   Non-Invasive Evaluation(s): Coronary CTA (03/13/2022): No significant coronary artery disease.  Coronary artery calcium score 0.  No acute cardiopulmonary abnormalities noted.  Chronic atelectasis of the right middle lobe noted as well as bilateral lower lobe scarring. TTE (10/31/2021): Normal LV size and wall thickness.  LVEF 60-65% with normal diastolic function.  Normal RV size and function.  Normal biatrial size.  Mild mitral regurgitation.  Trivial tricuspid regurgitation.  Elevated CVP (~15 mmHg).  Recent CV Pertinent Labs: Lab Results  Component Value Date   CHOL 204 (H) 07/15/2021   HDL 61.10 07/15/2021   LDLCALC 123 (H) 07/15/2021   LDLCALC 109 (H)  05/09/2018   TRIG 99.0 07/15/2021   CHOLHDL 3 07/15/2021   K 4.4 02/15/2022   BUN 29 (H) 02/15/2022   CREATININE 0.80 10/13/2022   CREATININE 0.77 11/18/2021    Past medical and surgical history were reviewed and updated in EPIC.  No outpatient medications have been marked as taking for the 10/20/22 encounter (Appointment) with Rahiem Schellinger, Melissa Gave, MD.    Allergies: Novocain [procaine], Crestor [rosuvastatin], Influenza vaccines, and Lipitor [atorvastatin calcium]  Social History   Tobacco Use   Smoking status: Former    Packs/day: 1.00    Years: 23.00    Total pack years: 23.00    Types: Cigarettes    Quit date: 83    Years since quitting: 37.1   Smokeless tobacco: Never  Vaping Use   Vaping Use: Never used  Substance Use Topics   Alcohol use: Never   Drug use: Never    Family History  Problem Relation Age of Onset   Sudden death Mother 12       drowning in hurricane Katrina   Pancreatic cancer Father        deceased early 33s; smoker   Alcohol abuse Father    COPD Father    Melanoma Sister        deceased at 24   COPD Sister    Heart disease Brother        Angina   Coronary artery disease Brother    Heart disease Brother        Heart murmur   Prostate cancer Maternal Uncle        deceased 49   Breast cancer Paternal Aunt        unsure of this dx    Review of Systems: A 12-system review of systems was performed and was negative except  as noted in the HPI.  --------------------------------------------------------------------------------------------------  Physical Exam: There were no vitals taken for this visit.  General:  NAD. Neck: No JVD or HJR. Lungs: Clear to auscultation bilaterally without wheezes or crackles. Heart: Regular rate and rhythm without murmurs, rubs, or gallops. Abdomen: Soft, nontender, nondistended. Extremities: No lower extremity edema.  EKG:  ***  Lab Results  Component Value Date   WBC 6.6 11/18/2021   HGB 13.3  11/18/2021   HCT 41.2 11/18/2021   MCV 94.3 11/18/2021   PLT 311 11/18/2021    Lab Results  Component Value Date   NA 141 02/15/2022   K 4.4 02/15/2022   CL 106 02/15/2022   CO2 29 02/15/2022   BUN 29 (H) 02/15/2022   CREATININE 0.80 10/13/2022   GLUCOSE 105 (H) 02/15/2022   ALT 14 11/18/2021    Lab Results  Component Value Date   CHOL 204 (H) 07/15/2021   HDL 61.10 07/15/2021   LDLCALC 123 (H) 07/15/2021   TRIG 99.0 07/15/2021   CHOLHDL 3 07/15/2021    --------------------------------------------------------------------------------------------------  ASSESSMENT AND PLAN: Melissa Gave Sumaya Riedesel, MD 10/20/2022 6:11 AM

## 2022-10-26 ENCOUNTER — Ambulatory Visit: Payer: 59 | Admitting: Physician Assistant

## 2022-10-29 ENCOUNTER — Other Ambulatory Visit: Payer: Self-pay | Admitting: Pulmonary Disease

## 2022-11-10 ENCOUNTER — Other Ambulatory Visit: Payer: Self-pay

## 2022-11-10 DIAGNOSIS — Z8543 Personal history of malignant neoplasm of ovary: Secondary | ICD-10-CM

## 2022-11-20 ENCOUNTER — Other Ambulatory Visit: Payer: Self-pay

## 2022-11-20 ENCOUNTER — Ambulatory Visit: Payer: Medicare Other | Admitting: Physician Assistant

## 2022-11-20 DIAGNOSIS — N3946 Mixed incontinence: Secondary | ICD-10-CM

## 2022-11-20 DIAGNOSIS — N3281 Overactive bladder: Secondary | ICD-10-CM

## 2022-11-20 MED ORDER — GEMTESA 75 MG PO TABS: 75.0000 mg | ORAL_TABLET | Freq: Every day | ORAL | 1 refills | Status: DC

## 2022-11-22 ENCOUNTER — Ambulatory Visit
Admission: RE | Admit: 2022-11-22 | Discharge: 2022-11-22 | Disposition: A | Discharge status: Home / Self Care | Payer: Medicare Other | Source: Ambulatory Visit | Attending: Nurse Practitioner | Admitting: Nurse Practitioner

## 2022-11-22 DIAGNOSIS — Z1231 Encounter for screening mammogram for malignant neoplasm of breast: Secondary | ICD-10-CM | POA: Insufficient documentation

## 2022-11-27 ENCOUNTER — Telehealth: Payer: Self-pay | Admitting: Urology

## 2022-11-27 DIAGNOSIS — N3946 Mixed incontinence: Secondary | ICD-10-CM

## 2022-11-27 DIAGNOSIS — N3281 Overactive bladder: Secondary | ICD-10-CM

## 2022-11-27 MED ORDER — GEMTESA 75 MG PO TABS: 75.0000 mg | ORAL_TABLET | Freq: Every day | ORAL | 3 refills | Status: DC

## 2022-11-27 NOTE — Telephone Encounter (Signed)
Patient stopped by office this morning and stated that she has always gotten a 90 day refill for Gemtesa. She wants to know why it was changed to 30 with one refill. She said her insurance will only pay for 30 or 90, but not 60. She would like the refill for 90 at one time because she will have to pay more than double to have filled. Please call patient and let her know if this can be changed back to 90. Pharmacy is [facility] on [address].

## 2022-12-04 DIAGNOSIS — J382 Nodules of vocal cords: Secondary | ICD-10-CM | POA: Diagnosis not present

## 2022-12-04 DIAGNOSIS — K219 Gastro-esophageal reflux disease without esophagitis: Secondary | ICD-10-CM | POA: Diagnosis not present

## 2022-12-04 DIAGNOSIS — H6123 Impacted cerumen, bilateral: Secondary | ICD-10-CM | POA: Diagnosis not present

## 2022-12-11 ENCOUNTER — Ambulatory Visit (INDEPENDENT_AMBULATORY_CARE_PROVIDER_SITE_OTHER): Payer: Medicare Other | Admitting: Pulmonary Disease

## 2022-12-11 ENCOUNTER — Encounter: Payer: Self-pay | Admitting: Pulmonary Disease

## 2022-12-11 VITALS — BP 116/75 | HR 75 | Temp 97.3°F | Ht 66.0 in | Wt 189.8 lb

## 2022-12-11 DIAGNOSIS — Z8543 Personal history of malignant neoplasm of ovary: Secondary | ICD-10-CM | POA: Diagnosis not present

## 2022-12-11 DIAGNOSIS — R0602 Shortness of breath: Secondary | ICD-10-CM

## 2022-12-11 DIAGNOSIS — I5032 Chronic diastolic (congestive) heart failure: Secondary | ICD-10-CM | POA: Diagnosis not present

## 2022-12-11 DIAGNOSIS — K219 Gastro-esophageal reflux disease without esophagitis: Secondary | ICD-10-CM

## 2022-12-11 DIAGNOSIS — J4489 Other specified chronic obstructive pulmonary disease: Secondary | ICD-10-CM | POA: Diagnosis not present

## 2022-12-11 DIAGNOSIS — K449 Diaphragmatic hernia without obstruction or gangrene: Secondary | ICD-10-CM | POA: Diagnosis not present

## 2022-12-11 LAB — NITRIC OXIDE: Nitric Oxide: 7

## 2022-12-11 NOTE — Patient Instructions (Addendum)
We are going to refer you to pulmonary rehab.  The physician I have referred you to is DR. Duncan Dull, she is set [facility].  Phone number is [phone].  Dr. Darrick Huntsman has graciously accepted you as a patient.  Dr. Darrick Huntsman will have her assistant Clydie Braun call you to make an appointment.  Continue using your Trelegy.  I recommend that you take a tablet of Pepcid (famotidine) 20 mg, at bedtime.  This helps with reflux.  You can get this over-the-counter.  We will see you in follow-up in 3 months time call sooner should any problems arise.

## 2022-12-11 NOTE — Progress Notes (Signed)
Subjective:    Patient ID: Melissa Anderson, female    DOB: Aug 12, 1941, 82 y.o.   MRN: [id number] Patient Care Team: Pcp, No as PCP - General End, Cristal Deer, MD as PCP - Cardiology (Cardiology) Artelia Laroche, MD as Referring Physician (Obstetrics and Gynecology) Creig Hines, MD as Consulting Physician (Oncology) Benita Gutter, RN as Oncology Nurse Navigator Salena Saner, MD as Consulting Physician (Pulmonary Disease)  Chief Complaint  Patient presents with   Follow-up    SOB with exertion. No wheezing or cough.    HPI Melissa Anderson is an 82 year old former smoker (23 PY) who presents for follow-up on the issue of asthmatic bronchitis and shortness of breath.  She was last evaluated here on 13 June 2022.  She has been doing well on Trelegy Ellipta and as needed levo albuterol.  She has not required use of the levo albuterol at all since her last visit.  She is being managed by cardiology for chronic diastolic heart failure, she is on furosemide and this has helped with her sensation of shortness of breath as well.  At her prior visit we made a referral to Dr. Duncan Dull at her request.  She however did not follow through with this.  She requests that this be done again.  She wants to get more active but is not sure how to go about it.  She has had no orthopnea or paroxysmal nocturnal dyspnea.  No increased lower extremity edema, this has actually been better since she started furosemide.  No chest pain, no calf tenderness.  No wheezing noted.  She had a recent CT chest abdomen and pelvis on 13 October 2022 to evaluate for potential recurrence of ovarian cancer.  This did not show any recurrence.  It does show a hiatal hernia and she was actually refluxing during the time of the test.  ENT has also expressed that she does have issues with reflux and has instituted antireflux measures.  She is not on any acid reducers.   Overall she feels well and looks  well.   Review of Systems A 10 point review of systems was performed and it is as noted above otherwise negative.  Patient Active Problem List   Diagnosis Date Noted   Bilateral hip pain 02/23/2022   Dyspnea on exertion 12/31/2021   Chronic diastolic heart failure 12/31/2021   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   Lymphedema 09/28/2020   Abnormal gait 09/28/2020   Arthritis, 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   Aortic atherosclerosis 07/14/2020   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   Chemotherapy-induced peripheral neuropathy 02/11/2020   History of ovarian cancer 02/11/2020   Acute pain of right shoulder 08/15/2018   Guillain-Barre syndrome 06/17/2018   Chemotherapy-induced neuropathy 05/14/2018   Vitamin D deficiency 05/08/2018   Neuropathy 05/08/2018   Overactive bladder 05/08/2018   Bilateral leg edema 05/08/2018   HLD (hyperlipidemia) 05/08/2018   Goals of care, counseling/discussion 02/25/2018   Ovarian cancer 02/22/2018   Graves' disease 02/13/2018   Social History   Tobacco Use   Smoking status: Former    Packs/day: 1.00    Years: 23.00    Additional pack years: 0.00    Total pack years: 23.00    Types: Cigarettes    Quit date: 1987    Years since quitting: 37.3   Smokeless tobacco: Never  Substance Use Topics   Alcohol use: Never   Allergies  Allergen Reactions   Novocain [Procaine] Palpitations   Crestor [Rosuvastatin]  Myopathy     Influenza Vaccines     ?GBS 05/2018    Lipitor [Atorvastatin Calcium]     Myalgias     Current Meds  Medication Sig   aspirin EC 81 MG tablet Take 81 mg by mouth daily.   cyanocobalamin (VITAMIN B12) 500 MCG tablet Take 500 mcg by mouth daily.   finasteride (PROSCAR) 5 MG tablet Take 5 mg by mouth daily.   furosemide (LASIX) 20 MG tablet TAKE 1 TABLET BY MOUTH EVERY DAY   levalbuterol (XOPENEX HFA) 45 MCG/ACT inhaler Inhale 2 puffs into the lungs every 6 (six) hours as needed for wheezing or shortness of breath.   Multiple  Vitamins-Minerals (MULTIVITAMIN WITH MINERALS) tablet Take 1 tablet by mouth daily.   SYNTHROID 150 MCG tablet Take 1 tablet (150 mcg total) by mouth daily before breakfast. 30 min before food. You can take 150 mcg qd on Sundays 1.5 tablets d/c 175 mcg   TRELEGY ELLIPTA 100-62.5-25 MCG/ACT AEPB TAKE 1 PUFF BY MOUTH EVERY DAY   Vibegron (GEMTESA) 75 MG TABS Take 1 tablet (75 mg total) by mouth daily.   Vitamin D, Ergocalciferol, (DRISDOL) 1.25 MG (50000 UNIT) CAPS capsule Take 1 capsule (50,000 Units total) by mouth once a week. D3   Immunization History  Administered Date(s) Administered   Influenza, High Dose Seasonal PF 05/31/2018   Influenza-Unspecified 05/13/2018   PFIZER Comirnaty(Gray Top)Covid-19 Tri-Sucrose Vaccine 11/15/2020   PFIZER(Purple Top)SARS-COV-2 Vaccination 02/09/2020, 03/02/2020       Objective:   Physical Exam BP 116/75 (BP Location: Left Arm, Cuff Size: Normal)   Pulse 75   Temp (!) 97.3 F (36.3 C)   Ht 5\' 6"  (1.676 m)   Wt 189 lb 12.8 oz (86.1 kg)   SpO2 95%   BMI 30.63 kg/m   SpO2: 95 % O2 Device: None (Room air)  GENERAL: Well-developed, well-nourished, looks younger than stated age.  Fully ambulatory.  Conversational dyspnea. HEAD: Normocephalic, atraumatic.  EYES: Pupils equal, round, reactive to light.  No scleral icterus.  MOUTH: Oral mucosa moist.  No thrush. NECK: Supple. No thyromegaly. Trachea midline. No JVD.  No adenopathy. PULMONARY: Good air entry bilaterally.  No adventitious sounds. CARDIOVASCULAR: S1 and S2. Regular rate and rhythm.  No rubs, murmurs or gallops heard. ABDOMEN: Benign. MUSCULOSKELETAL: No joint deformity, no clubbing, +1 edema .  Prominent varicose veins of the lower extremities. NEUROLOGIC: No overt focal deficit, no gait disturbance, speech is fluent. SKIN: Intact,warm,dry. PSYCH: Mood and behavior normal.     Lab Results  Component Value Date   NITRICOXIDE 7 12/11/2022  *No evidence of type II inflammation      Assessment & Plan:     ICD-10-CM   1. Asthmatic bronchitis , chronic (HCC) - moderate  J44.89 AMB referral to pulmonary rehabilitation   Continue Trelegy Continue as needed albuterol    2. Chronic diastolic heart failure  I50.32 AMB referral to pulmonary rehabilitation   This issue adds complexity to her management Follows with cardiology    3. SOB (shortness of breath)  R06.02 Nitric oxide   Referral to pulmonary rehab Suspect needs conditioning    4. Hiatal hernia with GERD  K44.9    K21.9    Antireflux measures Pepcid 20 mg at bedtime    5. History of ovarian cancer  Z85.43    Recent imaging of 16 February shows no recurrence     Orders Placed This Encounter  Procedures   AMB referral to pulmonary rehabilitation  Referral Priority:   Routine    Referral Type:   Consultation    Number of Visits Requested:   1   Nitric oxide   I have once again made a referral to Dr. Duncan Dull at [facility].  The patient needs to make the appointment with Dr. Darrick Huntsman.  We will see the patient in follow-up in 3 months time she is to call sooner should any new problems arise.  Gailen Shelter, MD Advanced Bronchoscopy PCCM [facility]    *This note was dictated using voice recognition software/Dragon.  Despite best efforts to proofread, errors can occur which can change the meaning. Any transcriptional errors that result from this process are unintentional and may not be fully corrected at the time of dictation.

## 2022-12-12 ENCOUNTER — Telehealth: Payer: Self-pay | Admitting: Internal Medicine

## 2022-12-12 NOTE — Telephone Encounter (Signed)
Pt is calling in stating that she was referred by Dr. Jayme Cloud as a newpt to Dr. Darrick Huntsman.  Is it okay to schedule a newpt appointment with Dr. Darrick Huntsman?  If so the pt would like to be called to schedule.

## 2022-12-14 ENCOUNTER — Ambulatory Visit (INDEPENDENT_AMBULATORY_CARE_PROVIDER_SITE_OTHER): Payer: Medicare Other | Admitting: Internal Medicine

## 2022-12-14 ENCOUNTER — Other Ambulatory Visit: Payer: Self-pay | Admitting: Internal Medicine

## 2022-12-14 ENCOUNTER — Encounter: Payer: Self-pay | Admitting: Internal Medicine

## 2022-12-14 VITALS — BP 124/68 | HR 79 | Temp 97.6°F | Ht 66.0 in | Wt 187.2 lb

## 2022-12-14 DIAGNOSIS — T466X5A Adverse effect of antihyperlipidemic and antiarteriosclerotic drugs, initial encounter: Secondary | ICD-10-CM

## 2022-12-14 DIAGNOSIS — M791 Myalgia, unspecified site: Secondary | ICD-10-CM | POA: Diagnosis not present

## 2022-12-14 DIAGNOSIS — G620 Drug-induced polyneuropathy: Secondary | ICD-10-CM

## 2022-12-14 DIAGNOSIS — I700 Atherosclerosis of aorta: Secondary | ICD-10-CM | POA: Diagnosis not present

## 2022-12-14 DIAGNOSIS — Z8543 Personal history of malignant neoplasm of ovary: Secondary | ICD-10-CM

## 2022-12-14 DIAGNOSIS — T451X5A Adverse effect of antineoplastic and immunosuppressive drugs, initial encounter: Secondary | ICD-10-CM | POA: Diagnosis not present

## 2022-12-14 DIAGNOSIS — G629 Polyneuropathy, unspecified: Secondary | ICD-10-CM

## 2022-12-14 DIAGNOSIS — K449 Diaphragmatic hernia without obstruction or gangrene: Secondary | ICD-10-CM | POA: Insufficient documentation

## 2022-12-14 DIAGNOSIS — E785 Hyperlipidemia, unspecified: Secondary | ICD-10-CM

## 2022-12-14 DIAGNOSIS — N3281 Overactive bladder: Secondary | ICD-10-CM | POA: Diagnosis not present

## 2022-12-14 DIAGNOSIS — M169 Osteoarthritis of hip, unspecified: Secondary | ICD-10-CM | POA: Insufficient documentation

## 2022-12-14 DIAGNOSIS — G610 Guillain-Barre syndrome: Secondary | ICD-10-CM | POA: Diagnosis not present

## 2022-12-14 DIAGNOSIS — M1611 Unilateral primary osteoarthritis, right hip: Secondary | ICD-10-CM

## 2022-12-14 DIAGNOSIS — E039 Hypothyroidism, unspecified: Secondary | ICD-10-CM

## 2022-12-14 LAB — TSH: TSH: 2.63 u[IU]/mL (ref 0.35–5.50)

## 2022-12-14 LAB — HEPATIC FUNCTION PANEL
ALT: 17 U/L (ref 0–35)
AST: 19 U/L (ref 0–37)
Albumin: 4.2 g/dL (ref 3.5–5.2)
Alkaline Phosphatase: 69 U/L (ref 39–117)
Bilirubin, Direct: 0.1 mg/dL (ref 0.0–0.3)
Total Bilirubin: 0.7 mg/dL (ref 0.2–1.2)
Total Protein: 6.6 g/dL (ref 6.0–8.3)

## 2022-12-14 LAB — LDL CHOLESTEROL, DIRECT: Direct LDL: 124 mg/dL

## 2022-12-14 LAB — LIPID PANEL
Cholesterol: 202 mg/dL — ABNORMAL HIGH (ref 0–200)
HDL: 68 mg/dL (ref >39.00)
LDL Cholesterol: 116 mg/dL — ABNORMAL HIGH (ref 0–99)
NonHDL: 133.64
Total CHOL/HDL Ratio: 3
Triglycerides: 87 mg/dL (ref 0.0–149.0)
VLDL: 17.4 mg/dL (ref 0.0–40.0)

## 2022-12-14 MED ORDER — CELECOXIB 100 MG PO CAPS: 100.0000 mg | ORAL_CAPSULE | Freq: Two times a day (BID) | ORAL | 2 refills | Status: DC

## 2022-12-14 MED ORDER — PANTOPRAZOLE SODIUM 40 MG PO TBEC: 40.0000 mg | DELAYED_RELEASE_TABLET | Freq: Every day | ORAL | 3 refills | Status: DC

## 2022-12-14 MED ORDER — ROSUVASTATIN CALCIUM 20 MG PO TABS: 20.0000 mg | ORAL_TABLET | ORAL | 0 refills | Status: DC

## 2022-12-14 MED ORDER — SYNTHROID 150 MCG PO TABS: 150.0000 ug | ORAL_TABLET | Freq: Every day | ORAL | 3 refills | Status: DC
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
## 2022-12-14 NOTE — Assessment & Plan Note (Addendum)
Aortic arch atherosclerosis:  Reviewed findings of prior CT scan today..  Patient has an unclear history of statin myalgias  .  She is willing to initiate crestor trial every other day pending evaluation of thyroid function
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
## 2022-12-14 NOTE — Assessment & Plan Note (Addendum)
Her recent CT scan on 10/13/22 was reassuring.   Her CA 125 rose from 18 to 31 over the last 6 months.  But CT scan Feb 2024 was negative for recurrence or mets . Repeat is ordered for June prior to follow up with Dr Sonia Side.

## 2022-12-14 NOTE — Assessment & Plan Note (Signed)
Untreated due to statin myalgias.  She has aortic atherosclerosis by CT scan

## 2022-12-14 NOTE — Patient Instructions (Addendum)
You are due for your Annual Medicare Wellness visit, please schedule this appointment at checkout.   Start with 1000 mg tylenol every 12 hours .  Take this EVERY DAY AS DIRECTED   ADD CELEBREX ONCE OR TWICE DAILY IF NEEDED .   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  We will need to make sure your thyroid function is normalized TODAY FIRST.   ONCE YOU START THE CRESTOR,  WE WILL NEED TO check your liver and muscle enzymes in 3 weeks    HOME HEALTH REFERRAL TO LOOK AT YOUR BATHROOM AND RECOMMEND SAFETY MEASURES

## 2022-12-14 NOTE — Assessment & Plan Note (Signed)
Managed with [facility] by Urology . S/p PTNS Jan 2024

## 2022-12-14 NOTE — Assessment & Plan Note (Signed)
Diagnosis explained,  recommending PPI ,  elevation of head of bed,  and lifestyle modifications

## 2022-12-14 NOTE — Assessment & Plan Note (Signed)
Thyroid function is WNL on current dose of 159 mcg Synthroid daily. No current changes needed.

## 2022-12-15 ENCOUNTER — Other Ambulatory Visit: Payer: Self-pay | Admitting: Internal Medicine

## 2022-12-15 DIAGNOSIS — E039 Hypothyroidism, unspecified: Secondary | ICD-10-CM

## 2022-12-15 NOTE — Telephone Encounter (Signed)
Pharmacy comment: Script Clarification:INSTRUCTIONS STATE TO TAKE , AT THE END IT STATES TO TAKE 1.5 TABLETS AND ON SUNDAYS.  WHAT IS THE ACTUAL DOSE.  140 TABLETS LEADS TO THINK 1.5 TABLETS EVERY DAY.

## 2022-12-19 ENCOUNTER — Telehealth: Payer: Self-pay | Admitting: Internal Medicine

## 2022-12-19 NOTE — Telephone Encounter (Signed)
Pt called stating she is checking on her thyroid test and also a gynecology call that she received

## 2022-12-20 ENCOUNTER — Ambulatory Visit: Payer: Medicare Other | Admitting: Podiatry

## 2022-12-20 ENCOUNTER — Ambulatory Visit: Payer: Medicare Other | Admitting: Podiatrist

## 2022-12-20 NOTE — Telephone Encounter (Signed)
LMTCB

## 2022-12-21 ENCOUNTER — Encounter: Payer: Self-pay | Admitting: Internal Medicine

## 2022-12-21 ENCOUNTER — Ambulatory Visit (INDEPENDENT_AMBULATORY_CARE_PROVIDER_SITE_OTHER): Payer: Medicare Other

## 2022-12-21 ENCOUNTER — Ambulatory Visit: Payer: Medicare Other | Attending: Internal Medicine | Admitting: Internal Medicine

## 2022-12-21 VITALS — Ht 66.0 in | Wt 191.0 lb

## 2022-12-21 VITALS — BP 120/62 | HR 72 | Ht 66.0 in | Wt 191.2 lb

## 2022-12-21 DIAGNOSIS — Z79899 Other long term (current) drug therapy: Secondary | ICD-10-CM

## 2022-12-21 DIAGNOSIS — Z0000 Encounter for general adult medical examination without abnormal findings: Secondary | ICD-10-CM | POA: Diagnosis not present

## 2022-12-21 DIAGNOSIS — R0602 Shortness of breath: Secondary | ICD-10-CM | POA: Diagnosis not present

## 2022-12-21 DIAGNOSIS — M7989 Other specified soft tissue disorders: Secondary | ICD-10-CM

## 2022-12-21 DIAGNOSIS — E785 Hyperlipidemia, unspecified: Secondary | ICD-10-CM | POA: Diagnosis not present

## 2022-12-21 DIAGNOSIS — I700 Atherosclerosis of aorta: Secondary | ICD-10-CM | POA: Diagnosis not present

## 2022-12-21 MED ORDER — ROSUVASTATIN CALCIUM 5 MG PO TABS: 5.0000 mg | ORAL_TABLET | Freq: Every day | ORAL | 3 refills | Status: DC

## 2022-12-21 NOTE — Progress Notes (Signed)
Follow-up Outpatient Visit Date: 12/21/2022  Primary Care Provider: Sherlene Shams, MD [address] [place] [address]  Chief Complaint: Follow-up aortic atherosclerosis and shortness of breath  HPI:  Ms. Eagleton is a 82 y.o. female with history of aortic atherosclerosis, hyperlipidemia, asthmatic bronchitis, ovarian cancer, Guillain-Barr syndrome, lymphedema, and hypothyroidism, who presents for follow-up of shortness of breath.  I last saw her in 02/2022, at which time she reported feeling less short of breath since starting furosemide.  She continued to note some leg edema but admitted to not using her lymphedema pumps on a regular basis.  Subsequent coronary CTA showed normal coronary arteries.  She was recently advised by Dr. Darrick Huntsman to restart rosuvastatin given previously noted aortic atherosclerosis in the abdominal aorta.  Allergy list indicates myopathy with statins in the past, though Ms. Devivo believes that rosuvastatin is contraindicated in the setting of hypothyroidism.  Today, Ms. Caban reports feeling fairly well from a heart standpoint without chest pain or significant shortness of breath.  She is going to the [facility] 3 days a week to help improve her stamina.  She is limited by pain in her right hip for which she is currently undergoing acupuncture.  She also anticipates needing surgery in the not-too-distant future.  Celecoxib was recently prescribed by Dr. Darrick Huntsman, though Ms. Pires has yet to pick this up.  Pantoprazole was also recommended by Dr. Darrick Huntsman, though I do not see that a prescription was sent in.  Ms. Philbin has not had any palpitations, lightheadedness, or falls.  Chronic leg edema seems to be a little bit better, though she has not been using lymphedema pumps or compression stockings on a regular basis.  --------------------------------------------------------------------------------------------------  Cardiovascular History &  Procedures: Cardiovascular Problems: Dyspnea on exertion   Risk Factors: Aortic atherosclerosis, hyperlipidemia, obesity, and age greater than 45   Cath/PCI: None   CV Surgery: None   EP Procedures and Devices: None   Non-Invasive Evaluation(s): Coronary CTA (03/13/2022): Normal coronary arteries without stenosis or calcification.  No acute cardiopulmonary abnormalities.  Chronic atelectasis of the right middle lobe as well as bilateral lower lobe scarring. TTE (10/31/2021): Normal LV size and wall thickness.  LVEF 60-65% with normal diastolic function.  Normal RV size and function.  Normal biatrial size.  Mild mitral regurgitation.  Trivial tricuspid regurgitation.  Elevated CVP (~15 mmHg).  Recent CV Pertinent Labs: Lab Results  Component Value Date   CHOL 202 (H) 12/14/2022   HDL 68.00 12/14/2022   LDLCALC 116 (H) 12/14/2022   LDLCALC 109 (H) 05/09/2018   LDLDIRECT 124.0 12/14/2022   TRIG 87.0 12/14/2022   CHOLHDL 3 12/14/2022   K 4.4 02/15/2022   BUN 29 (H) 02/15/2022   CREATININE 0.80 10/13/2022   CREATININE 0.77 11/18/2021    Past medical and surgical history were reviewed and updated in EPIC.  Current Meds  Medication Sig   cyanocobalamin (VITAMIN B12) 500 MCG tablet Take 500 mcg by mouth daily.   finasteride (PROSCAR) 5 MG tablet Take 5 mg by mouth daily.   furosemide (LASIX) 20 MG tablet TAKE 1 TABLET BY MOUTH EVERY DAY   levalbuterol (XOPENEX HFA) 45 MCG/ACT inhaler Inhale 2 puffs into the lungs every 6 (six) hours as needed for wheezing or shortness of breath.   Multiple Vitamins-Minerals (MULTIVITAMIN WITH MINERALS) tablet Take 1 tablet by mouth daily.   SYNTHROID 150 MCG tablet TAKE 1 TABLET (150 MCG TOTAL) BY MOUTH DAILY BEFORE BREAKFAST. 30 MIN BEFORE FOOD. YOU CAN  TAKE 150 MCG QD ON SUNDAYS 1.5 TABLETS D/C 175 MCG   TRELEGY ELLIPTA 100-62.5-25 MCG/ACT AEPB TAKE 1 PUFF BY MOUTH EVERY DAY   Vibegron (GEMTESA) 75 MG TABS Take 1 tablet (75 mg total) by mouth  daily.    Allergies: Novocain [procaine], Crestor [rosuvastatin], Influenza vaccines, and Lipitor [atorvastatin calcium]  Social History   Tobacco Use   Smoking status: Former    Packs/day: 1.00    Years: 23.00    Additional pack years: 0.00    Total pack years: 23.00    Types: Cigarettes    Quit date: 107    Years since quitting: 37.3   Smokeless tobacco: Never  Vaping Use   Vaping Use: Never used  Substance Use Topics   Alcohol use: Never   Drug use: Never    Family History  Problem Relation Age of Onset   Sudden death Mother 69       drowning in hurricane Katrina   Pancreatic cancer Father        deceased early 72s; smoker   Alcohol abuse Father    COPD Father    Melanoma Sister        deceased at 36   COPD Sister    Heart disease Brother        Angina   Coronary artery disease Brother    Heart disease Brother        Heart murmur   Prostate cancer Maternal Uncle        deceased 26   Breast cancer Paternal Aunt        unsure of this dx    Review of Systems: A 12-system review of systems was performed and was negative except as noted in the HPI.  --------------------------------------------------------------------------------------------------  Physical Exam: BP 120/62 (BP Location: Left Arm, Patient Position: Sitting, Cuff Size: Normal)   Pulse 72   Ht  (1.676 m)   Wt 191 lb 4 oz (86.8 kg)   SpO2 97%   BMI 30.87 kg/m   General:  NAD. Neck: No JVD or HJR. Lungs: Clear to auscultation bilaterally without wheezes or crackles. Heart: Regular rate and rhythm without murmurs, rubs, or gallops. Abdomen: Soft, nontender, nondistended. Extremities: 1+ nonpitting calf edema bilaterally.  EKG:  Normal sinus rhythm without abnormalities.  Lab Results  Component Value Date   WBC 6.6 11/18/2021   HGB 13.3 11/18/2021   HCT 41.2 11/18/2021   MCV 94.3 11/18/2021   PLT 311 11/18/2021    Lab Results  Component Value Date   NA 141 02/15/2022   K  4.4 02/15/2022   CL 106 02/15/2022   CO2 29 02/15/2022   BUN 29 (H) 02/15/2022   CREATININE 0.80 10/13/2022   GLUCOSE 105 (H) 02/15/2022   ALT 17 12/14/2022    Lab Results  Component Value Date   CHOL 202 (H) 12/14/2022   HDL 68.00 12/14/2022   LDLCALC 116 (H) 12/14/2022   LDLDIRECT 124.0 12/14/2022   TRIG 87.0 12/14/2022   CHOLHDL 3 12/14/2022    --------------------------------------------------------------------------------------------------  ASSESSMENT AND PLAN: Aortic atherosclerosis and hyperlipidemia: Incidentally noted on prior CT of the abdomen.  Coronary CTA after last visit showed no coronary artery calcification or aortic atherosclerosis in the visualized thoracic aorta.  I think it is reasonable to rechallenge Ms. Gehl with rosuvastatin, as recently recommended by Dr. Darrick Huntsman, given that her LDL was mildly elevated at 116.  We have agreed to start rosuvastatin 5 mg daily and obtain a CMP, total CK, and  lipid panel in 1 month.  She should alert [place] if she has any new pains given history of statin myopathy.  If she is intolerant of low-dose rosuvastatin, ezetimibe may be a reasonable alternative.  Shortness of breath and leg swelling: Chronic shortness of breath is unlikely to be cardiac in etiology given normal coronary CTA and preserved LVEF with normal diastolic function by echo.  Ongoing management per Drs. Ottie Glazier.  Continue furosemide.  Encouraged regular lymphedema pump and compression stocking use for management of chronic lower extremity edema.  Follow-up: Return to clinic in 6 months.  Yvonne Kendall, MD 12/21/2022 9:27 AM

## 2022-12-21 NOTE — Progress Notes (Signed)
Subjective:   Melissa Anderson is a 82 y.o. female who presents for Medicare Annual (Subsequent) preventive examination.  Review of Systems    No ROS.  Medicare Wellness Virtual Visit.  Visual/audio telehealth visit, UTA vital signs.   See social history for additional risk factors.   Cardiac Risk Factors include: advanced age (>67men, >32 women)     Objective:    Today's Vitals   12/21/22 1610  Weight: 191 lb (86.6 kg)  Height: 5\' 6"  (1.676 m)   Body mass index is 30.83 kg/m.     12/21/2022    4:14 PM 10/18/2022    8:49 AM 04/12/2022   10:32 AM 10/21/2021   11:20 AM 10/12/2021   10:39 AM 07/13/2021    9:01 AM 01/05/2021    3:02 PM  Advanced Directives  Does Patient Have a Medical Advance Directive? Yes Yes Yes Yes Yes Yes Yes  Type of Estate agent of [place];Living will Living will;Healthcare Power of Attorney Living will;Healthcare Power of [employer] Power of [place];Living will  Healthcare Power of [place];Living will  Does patient want to make changes to medical advance directive? No - Patient declined Yes (ED - Information included in AVS) Yes (ED - Information included in AVS)  No - Patient declined Yes (ED - Information included in AVS) Yes (ED - Information included in AVS)  Copy of Healthcare Power of Attorney in Chart? Yes - validated most recent copy scanned in chart (See row information)   Yes - validated most recent copy scanned in chart (See row information) Yes - validated most recent copy scanned in chart (See row information)    Would patient like information on creating a medical advance directive?  Yes (ED - Information included in AVS) Yes (ED - Information included in AVS)  No - Patient declined Yes (ED - Information included in AVS) Yes (ED - Information included in AVS)    Current Medications (verified) Outpatient Encounter Medications as of 12/21/2022  Medication Sig   celecoxib (CELEBREX) 100 MG capsule Take 1 capsule  (100 mg total) by mouth 2 (two) times daily. (Patient not taking: Reported on 12/21/2022)   cyanocobalamin (VITAMIN B12) 500 MCG tablet Take 500 mcg by mouth daily.   finasteride (PROSCAR) 5 MG tablet Take 5 mg by mouth daily.   furosemide (LASIX) 20 MG tablet TAKE 1 TABLET BY MOUTH EVERY DAY   levalbuterol (XOPENEX HFA) 45 MCG/ACT inhaler Inhale 2 puffs into the lungs every 6 (six) hours as needed for wheezing or shortness of breath.   Multiple Vitamins-Minerals (MULTIVITAMIN WITH MINERALS) tablet Take 1 tablet by mouth daily.   rosuvastatin (CRESTOR) 5 MG tablet Take 1 tablet (5 mg total) by mouth daily.   SYNTHROID 150 MCG tablet TAKE 1 TABLET (150 MCG TOTAL) BY MOUTH DAILY BEFORE BREAKFAST. 30 MIN BEFORE FOOD. YOU CAN TAKE 150 MCG QD ON SUNDAYS 1.5 TABLETS D/C 175 MCG   TRELEGY ELLIPTA 100-62.5-25 MCG/ACT AEPB TAKE 1 PUFF BY MOUTH EVERY DAY   Vibegron (GEMTESA) 75 MG TABS Take 1 tablet (75 mg total) by mouth daily.   Vitamin D, Ergocalciferol, (DRISDOL) 1.25 MG (50000 UNIT) CAPS capsule Take 1 capsule (50,000 Units total) by mouth once a week. D3 (Patient not taking: Reported on 12/21/2022)   No facility-administered encounter medications on file as of 12/21/2022.    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    04/2021 [facility]   Cancer 2019   b/l ovaries and left fallopian tube pelvic lymph nodes negative    Guillain Barr syndrome    2/2 flu shot 05/2018,  intubated x 8 days   Hyperlipidemia    Lymphedema    Overactive bladder    Thyroid disease    UTI (urinary tract infection)    Vitamin D deficiency    Past Surgical History:  Procedure Laterality Date   ABDOMINAL HYSTERECTOMY     02/15/18    CHOLECYSTECTOMY  2008   LAPAROSCOPIC VAGINAL HYSTERECTOMY  WITH SALPINGO OOPHORECTOMY  02/15/2018   PORTACATH PLACEMENT Left 02/25/2018   Procedure: INSERTION PORT-A-CATH;  Surgeon: Earline Mayotte, MD;  Location: [facility] ORS;  Service: General;  Laterality: Left;   TOTAL ABDOMINAL HYSTERECTOMY W/ BILATERAL SALPINGOOPHORECTOMY  02/15/2018   lymph nodes removed   Family History  Problem Relation Age of Onset   Sudden death Mother 70       drowning in hurricane Katrina   Pancreatic cancer Father        deceased early 4s; smoker   Alcohol abuse Father    COPD Father    Melanoma Sister        deceased at 85   COPD Sister    Heart disease Brother        Angina   Coronary artery disease Brother    Heart disease Brother        Heart murmur   Prostate cancer Maternal Uncle        deceased 70   Breast cancer Paternal Aunt        unsure of this dx   Social History   Socioeconomic History   Marital status: Widowed    Spouse name: Not on file   Number of children: Not on file   Years of education: Not on file   Highest education level: Not on file  Occupational History   Not on file  Tobacco Use   Smoking status: Former    Packs/day: 1.00    Years: 23.00    Additional pack years: 0.00    Total pack years: 23.00    Types: Cigarettes    Quit date: 7    Years since quitting: 37.3   Smokeless tobacco: Never  Vaping Use   Vaping Use: Never used  Substance and Sexual Activity   Alcohol use: Never   Drug use: Never   Sexual activity: Not Currently  Other Topics Concern   Not on file  Social History Occupational hygienist, bachelors degree    Lives in [place] here visiting daughter Melissa Anderson    Former smoker quit age 22 y.o    Social Determinants of Health   Financial Resource Strain: Low Risk  (12/21/2022)   Overall Financial Resource Strain (CARDIA)    Difficulty of Paying Living Expenses: Not hard at all  Food Insecurity: No Food Insecurity (12/21/2022)   Hunger Vital Sign    Worried About Running Out of Food in the Last Year:  Never true    Ran Out of Food in the Last Year: Never true  Transportation Needs: No Transportation Needs (12/21/2022)   PRAPARE - Administrator, Civil Service (Medical): No    Lack of Transportation (Non-Medical): No  Physical Activity: Insufficiently Active (12/21/2022)   Exercise Vital Sign    Days  of Exercise per Week: 3 days    Minutes of Exercise per Session: 30 min  Stress: No Stress Concern Present (12/21/2022)   [employer] of Occupational Health - Occupational Stress Questionnaire    Feeling of Stress : Not at all  Social Connections: Unknown (12/21/2022)   Social Connection and Isolation Panel [NHANES]    Frequency of Communication with Friends and Family: Not on file    Frequency of Social Gatherings with Friends and Family: More than three times a week    Attends Religious Services: Not on Marketing executive or Organizations: Not on file    Attends Banker Meetings: Not on file    Marital Status: Not on file    Tobacco Counseling Counseling given: Not Answered   Clinical Intake:  Pre-visit preparation completed: Yes        Diabetes: No  How often do you need to have someone help you when you read instructions, pamphlets, or other written materials from your doctor or pharmacy?: 1 - Never    Interpreter Needed?: No      Activities of Daily Living    12/21/2022    4:15 PM  In your present state of health, do you have any difficulty performing the following activities:  Hearing? 1  Comment Hearing aids  Vision? 0  Difficulty concentrating or making decisions? 0  Walking or climbing stairs? 0  Comment Paces self with activity  Dressing or bathing? 0  Doing errands, shopping? 0  Preparing Food and eating ? N  Using the Toilet? N  In the past six months, have you accidently leaked urine? Y  Comment Managed with medication and brief/depend  Do you have problems with loss of bowel control? N  Managing your  Medications? N  Managing your Finances? N  Housekeeping or managing your Housekeeping? Y  Comment Maid assist    Patient Care Team: Sherlene Shams, MD as PCP - General (Internal Medicine) End, Cristal Deer, MD as PCP - Cardiology (Cardiology) Artelia Laroche, MD as Referring Physician (Obstetrics and Gynecology) Creig Hines, MD as Consulting Physician (Oncology) Benita Gutter, RN as Oncology Nurse Navigator Salena Saner, MD as Consulting Physician (Pulmonary Disease)  Indicate any recent Medical Services you may have received from other than [facility] providers in the past year (date may be approximate).     Assessment:   This is a routine wellness examination for [place].  I connected with  CHANESE HARTSOUGH on 12/21/22 by a audio enabled telemedicine application and verified that I am speaking with the correct person using two identifiers.  Patient Location: Home  Provider Location: Office/Clinic  I discussed the limitations of evaluation and management by telemedicine. The patient expressed understanding and agreed to proceed.   Hearing/Vision screen Hearing Screening - Comments:: Hearing aid, bilateral  Vision Screening - Comments:: [facility] Wears corrective lenses Annual visits   Dietary issues and exercise activities discussed: Current Exercise Habits: Home exercise routine, Type of exercise: treadmill Charity fundraiser), Time (Minutes): 30, Frequency (Times/Week): 3, Weekly Exercise (Minutes/Week): 90, Intensity: Mild Healthy diet Good water intake    Goals Addressed               This Visit's Progress     Patient Stated     Add strength training exercises (pt-stated)         Depression Screen    12/21/2022    4:24 PM 12/14/2022   11:17  AM 02/23/2022    9:05 AM 11/18/2021    2:17 PM 10/21/2021   10:43 AM 10/20/2020   11:12 AM 03/09/2020    2:52 PM  PHQ 2/9 Scores  PHQ - 2 Score 0 0 0 0 0 0 0  PHQ- 9 Score       0     Fall Risk    12/21/2022    4:25 PM 12/14/2022   11:17 AM 02/23/2022    9:04 AM 11/18/2021    2:17 PM 10/21/2021   11:23 AM  Fall Risk   Falls in the past year? 1 1 0 1 0  Number falls in past yr: 0 0 0 1 0  Injury with Fall? 0 0 0 0   Risk for fall due to :  History of fall(s) No Fall Risks History of fall(s)   Follow up Falls evaluation completed;Falls prevention discussed Falls evaluation completed Falls evaluation completed Falls evaluation completed Falls evaluation completed    FALL RISK PREVENTION PERTAINING TO THE HOME: Home free of loose throw rugs in walkways, pet beds, electrical cords, etc? Yes  Adequate lighting in your home to reduce risk of falls? Yes   ASSISTIVE DEVICES UTILIZED TO PREVENT FALLS: Life alert? No  Use of a cane, walker or w/c? Yes  Grab bars in the bathroom? No  Shower chair or bench in shower? No  Elevated toilet seat or a handicapped toilet? No   TIMED UP AND GO: Was the test performed? No .    Cognitive Function:        12/21/2022    4:20 PM 05/31/2018    9:05 AM  6CIT Screen  What Year? 0 points 0 points  What month? 0 points 0 points  What time? 0 points 0 points  Count back from 20 0 points 0 points  Months in reverse 0 points 0 points  Repeat phrase 0 points 0 points  Total Score 0 points 0 points    Immunizations Immunization History  Administered Date(s) Administered   Influenza, High Dose Seasonal PF 05/31/2018   Influenza-Unspecified 05/13/2018   PFIZER Comirnaty(Gray Top)Covid-19 Tri-Sucrose Vaccine 11/15/2020   PFIZER(Purple Top)SARS-COV-2 Vaccination 02/09/2020, 03/02/2020   TDAP status: Due, Education has been provided regarding the importance of this vaccine. Advised may receive this vaccine at local pharmacy or Health Dept. Aware to provide a copy of the vaccination record if obtained from local pharmacy or Health Dept. Verbalized acceptance and understanding.Declined.   Pneumococcal vaccine status: Declined,   Education has been provided regarding the importance of this vaccine but patient still declined. Advised may receive this vaccine at local pharmacy or Health Dept. Aware to provide a copy of the vaccination record if obtained from local pharmacy or Health Dept. Verbalized acceptance and understanding.   Screening Tests Health Maintenance  Topic Date Due   DTaP/Tdap/Td (1 - Tdap) Never done   Pneumonia Vaccine 23+ Years old (1 of 1 - PCV) Never done   COVID-19 Vaccine (4 - 2023-24 season) 12/30/2022 (Originally 04/28/2022)   Medicare Annual Wellness (AWV)  12/21/2023   DEXA SCAN  Completed   HPV VACCINES  Aged Out   INFLUENZA VACCINE  Discontinued   Zoster Vaccines- Shingrix  Discontinued    Health Maintenance Health Maintenance Due  Topic Date Due   DTaP/Tdap/Td (1 - Tdap) Never done   Pneumonia Vaccine 40+ Years old (1 of 1 - PCV) Never done   Lung Cancer Screening: (Low Dose CT Chest recommended if Age 48-80 years,  30 pack-year currently smoking OR have quit w/in 15years.) does not qualify.   Hepatitis C Screening: does not qualify.  Vision Screening: Recommended annual ophthalmology exams for early detection of glaucoma and other disorders of the eye.  Dental Screening: Recommended annual dental exams for proper oral hygiene  Community Resource Referral / Chronic Care Management: CRR required this visit?  No   CCM required this visit?  No      Plan:     I have personally reviewed and noted the following in the patient's chart:   Medical and social history Use of alcohol, tobacco or illicit drugs  Current medications and supplements including opioid prescriptions. Patient is not currently taking opioid prescriptions. Functional ability and status Nutritional status Physical activity Advanced directives List of other physicians Hospitalizations, surgeries, and ER visits in previous 12 months Vitals Screenings to include cognitive, depression, and falls Referrals and  appointments  In addition, I have reviewed and discussed with patient certain preventive protocols, quality metrics, and best practice recommendations. A written personalized care plan for preventive services as well as general preventive health recommendations were provided to patient.     Cathey Endow, LPN   01/30/5408

## 2022-12-21 NOTE — Patient Instructions (Signed)
Medication Instructions:  Please start taking Crestor 5 mg daily.   *If you need a refill on your cardiac medications before your next appointment, please call your pharmacy*   Lab Work: Your provider would like for you to return in 1 moth to have the following labs drawn: CMP Lipid Panel and Total CK.   Please go to the [facility] entrance and check in at the front desk.  You do not need an appointment.  They are open from 7am-6 pm.  You will need to be fasting.  If you have labs (blood work) drawn today and your tests are completely normal, you will receive your results only by: MyChart Message (if you have MyChart) OR A paper copy in the mail If you have any lab test that is abnormal or we need to change your treatment, we will call you to review the results.   Testing/Procedures: No testing ordered today.    Follow-Up: At [facility], you and your health needs are our priority.  As part of our continuing mission to provide you with exceptional heart care, we have created designated Provider Care Teams.  These Care Teams include your primary Cardiologist (physician) and Advanced Practice Providers (APPs -  Physician Assistants and Nurse Practitioners) who all work together to provide you with the care you need, when you need it.  We recommend signing up for the patient portal called "MyChart".  Sign up information is provided on this After Visit Summary.  MyChart is used to connect with patients for Virtual Visits (Telemedicine).  Patients are able to view lab/test results, encounter notes, upcoming appointments, etc.  Non-urgent messages can be sent to your provider as well.   To learn more about what you can do with MyChart, go to [email or web address].    Your next appointment:   6 month(s)  Provider:   You may see Yvonne Kendall, MD or one of the following Advanced Practice Providers on your designated Care Team:   Nicolasa Ducking, NP Eula Listen,  PA-C Cadence Fransico Michael, PA-C Charlsie Quest, NP

## 2022-12-21 NOTE — Patient Instructions (Addendum)
Melissa Anderson , Thank you for taking time to come for your Medicare Wellness Visit. I appreciate your ongoing commitment to your health goals. Please review the following plan we discussed and let me know if I can assist you in the future.   These are the goals we discussed:  Goals       Patient Stated     Add strength training exercises (pt-stated)        This is a list of the screening recommended for you and due dates:  Health Maintenance  Topic Date Due   DTaP/Tdap/Td vaccine (1 - Tdap) Never done   Pneumonia Vaccine (1 of 1 - PCV) Never done   COVID-19 Vaccine (4 - 2023-24 season) 82   Medicare Annual Wellness Visit  12/21/2023   DEXA scan (bone density measurement)  Completed   HPV Vaccine  Aged Out   Flu Shot  Discontinued   Zoster (Shingles) Vaccine  Discontinued  *Topic was postponed. The date shown is not the original due date.    Advanced directives: on file  Conditions/risks identified: none new  Next appointment: Follow up in one year for your annual wellness visit    Preventive Care 65 Years and Older, Female Preventive care refers to lifestyle choices and visits with your health care provider that can promote health and wellness. What does preventive care include? A yearly physical exam. This is also called an annual well check. Dental exams once or twice a year. Routine eye exams. Ask your health care provider how often you should have your eyes checked. Personal lifestyle choices, including: Daily care of your teeth and gums. Regular physical activity. Eating a healthy diet. Avoiding tobacco and drug use. Limiting alcohol use. Practicing safe sex. Taking low-dose aspirin every day. Taking vitamin and mineral supplements as recommended by your health care provider. What happens during an annual well check? The services and screenings done by your health care provider during your annual well check will depend on your age, overall health,  lifestyle risk factors, and family history of disease. Counseling  Your health care provider may ask you questions about your: Alcohol use. Tobacco use. Drug use. Emotional well-being. Home and relationship well-being. Sexual activity. Eating habits. History of falls. Memory and ability to understand (cognition). Work and work Astronomer. Reproductive health. Screening  You may have the following tests or measurements: Height, weight, and BMI. Blood pressure. Lipid and cholesterol levels. These may be checked every 5 years, or more frequently if you are over 36 years old. Skin check. Lung cancer screening. You may have this screening every year starting at age 83 if you have a 30-pack-year history of smoking and currently smoke or have quit within the past 15 years. Fecal occult blood test (FOBT) of the stool. You may have this test every year starting at age 5. Flexible sigmoidoscopy or colonoscopy. You may have a sigmoidoscopy every 5 years or a colonoscopy every 10 years starting at age 36. Hepatitis C blood test. Hepatitis B blood test. Sexually transmitted disease (STD) testing. Diabetes screening. This is done by checking your blood sugar (glucose) after you have not eaten for a while (fasting). You may have this done every 1-3 years. Bone density scan. This is done to screen for osteoporosis. You may have this done starting at age 5. Mammogram. This may be done every 1-2 years. Talk to your health care provider about how often you should have regular mammograms. Talk with your health care provider about your  test results, treatment options, and if necessary, the need for more tests. Vaccines  Your health care provider may recommend certain vaccines, such as: Influenza vaccine. This is recommended every year. Tetanus, diphtheria, and acellular pertussis (Tdap, Td) vaccine. You may need a Td booster every 10 years. Zoster vaccine. You may need this after age  25. Pneumococcal 13-valent conjugate (PCV13) vaccine. One dose is recommended after age 66. Pneumococcal polysaccharide (PPSV23) vaccine. One dose is recommended after age 19. Talk to your health care provider about which screenings and vaccines you need and how often you need them. This information is not intended to replace advice given to you by your health care provider. Make sure you discuss any questions you have with your health care provider. Document Released: 09/10/2015 Document Revised: 05/03/2016 Document Reviewed: 06/15/2015 Elsevier Interactive Patient Education  2017 [place] Prevention in the Home Falls can cause injuries. They can happen to people of all ages. There are many things you can do to make your home safe and to help prevent falls. What can I do on the outside of my home? Regularly fix the edges of walkways and driveways and fix any cracks. Remove anything that might make you trip as you walk through a door, such as a raised step or threshold. Trim any bushes or trees on the path to your home. Use bright outdoor lighting. Clear any walking paths of anything that might make someone trip, such as rocks or tools. Regularly check to see if handrails are loose or broken. Make sure that both sides of any steps have handrails. Any raised decks and porches should have guardrails on the edges. Have any leaves, snow, or ice cleared regularly. Use sand or salt on walking paths during winter. Clean up any spills in your garage right away. This includes oil or grease spills. What can I do in the bathroom? Use night lights. Install grab bars by the toilet and in the tub and shower. Do not use towel bars as grab bars. Use non-skid mats or decals in the tub or shower. If you need to sit down in the shower, use a plastic, non-slip stool. Keep the floor dry. Clean up any water that spills on the floor as soon as it happens. Remove soap buildup in the tub or shower  regularly. Attach bath mats securely with double-sided non-slip rug tape. Do not have throw rugs and other things on the floor that can make you trip. What can I do in the bedroom? Use night lights. Make sure that you have a light by your bed that is easy to reach. Do not use any sheets or blankets that are too big for your bed. They should not hang down onto the floor. Have a firm chair that has side arms. You can use this for support while you get dressed. Do not have throw rugs and other things on the floor that can make you trip. What can I do in the kitchen? Clean up any spills right away. Avoid walking on wet floors. Keep items that you use a lot in easy-to-reach places. If you need to reach something above you, use a strong step stool that has a grab bar. Keep electrical cords out of the way. Do not use floor polish or wax that makes floors slippery. If you must use wax, use non-skid floor wax. Do not have throw rugs and other things on the floor that can make you trip. What can I do with my  stairs? Do not leave any items on the stairs. Make sure that there are handrails on both sides of the stairs and use them. Fix handrails that are broken or loose. Make sure that handrails are as long as the stairways. Check any carpeting to make sure that it is firmly attached to the stairs. Fix any carpet that is loose or worn. Avoid having throw rugs at the top or bottom of the stairs. If you do have throw rugs, attach them to the floor with carpet tape. Make sure that you have a light switch at the top of the stairs and the bottom of the stairs. If you do not have them, ask someone to add them for you. What else can I do to help prevent falls? Wear shoes that: Do not have high heels. Have rubber bottoms. Are comfortable and fit you well. Are closed at the toe. Do not wear sandals. If you use a stepladder: Make sure that it is fully opened. Do not climb a closed stepladder. Make sure that  both sides of the stepladder are locked into place. Ask someone to hold it for you, if possible. Clearly mark and make sure that you can see: Any grab bars or handrails. First and last steps. Where the edge of each step is. Use tools that help you move around (mobility aids) if they are needed. These include: Canes. Walkers. Scooters. Crutches. Turn on the lights when you go into a dark area. Replace any light bulbs as soon as they burn out. Set up your furniture so you have a clear path. Avoid moving your furniture around. If any of your floors are uneven, fix them. If there are any pets around you, be aware of where they are. Review your medicines with your doctor. Some medicines can make you feel dizzy. This can increase your chance of falling. Ask your doctor what other things that you can do to help prevent falls. This information is not intended to replace advice given to you by your health care provider. Make sure you discuss any questions you have with your health care provider. Document Released: 06/10/2009 Document Revised: 01/20/2016 Document Reviewed: 09/18/2014 Elsevier Interactive Patient Education  2017 [employer].

## 2023-01-01 ENCOUNTER — Telehealth: Payer: Self-pay | Admitting: Internal Medicine

## 2023-01-01 NOTE — Telephone Encounter (Signed)
Pt called stating the celecoxib and tylenol is not helping her hip pain and pt want clarification on her lab appointment she has on 5/13

## 2023-01-02 NOTE — Telephone Encounter (Signed)
Spoke with pt and advised her that she would need to schedule an appt with Dr. Darrick Huntsman to discuss the hip pain not getting any better. Pt is scheduled for tomorrow at 1pm.

## 2023-01-03 ENCOUNTER — Encounter: Payer: Self-pay | Admitting: Internal Medicine

## 2023-01-03 ENCOUNTER — Ambulatory Visit (INDEPENDENT_AMBULATORY_CARE_PROVIDER_SITE_OTHER): Payer: Medicare Other | Admitting: Internal Medicine

## 2023-01-03 VITALS — BP 100/62 | HR 90 | Temp 97.5°F | Ht 66.0 in | Wt 192.4 lb

## 2023-01-03 DIAGNOSIS — T466X5A Adverse effect of antihyperlipidemic and antiarteriosclerotic drugs, initial encounter: Secondary | ICD-10-CM

## 2023-01-03 DIAGNOSIS — M160 Bilateral primary osteoarthritis of hip: Secondary | ICD-10-CM | POA: Diagnosis not present

## 2023-01-03 DIAGNOSIS — R944 Abnormal results of kidney function studies: Secondary | ICD-10-CM | POA: Diagnosis not present

## 2023-01-03 DIAGNOSIS — E669 Obesity, unspecified: Secondary | ICD-10-CM | POA: Diagnosis not present

## 2023-01-03 DIAGNOSIS — I890 Lymphedema, not elsewhere classified: Secondary | ICD-10-CM | POA: Diagnosis not present

## 2023-01-03 DIAGNOSIS — E785 Hyperlipidemia, unspecified: Secondary | ICD-10-CM | POA: Diagnosis not present

## 2023-01-03 DIAGNOSIS — M791 Myalgia, unspecified site: Secondary | ICD-10-CM

## 2023-01-03 MED ORDER — TRAMADOL HCL 50 MG PO TABS: 50.0000 mg | ORAL_TABLET | Freq: Four times a day (QID) | ORAL | 0 refills | Status: AC | PRN

## 2023-01-03 NOTE — Progress Notes (Addendum)
Subjective:  Patient ID: Melissa Anderson, female    DOB: April 09, 1941  Age: 82 y.o. MRN: [id number]  CC: The primary encounter diagnosis was Lymphedema. Diagnoses of Myalgia due to statin, Hyperlipidemia, unspecified hyperlipidemia type, Bilateral hip joint arthritis, Obesity (BMI 30.0-34.9), and Decreased calculated glomerular filtration rate (GFR) were also pertinent to this visit.   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  She was Seen by me  in mid April,  advised to try using  celebrex and tylenol , which she has been doing; however her pain is  not controlled.    Her  pain is chronic and predates her relocation from [place].  It  has been present since 2019.  at one point was unable to lift her right leg, at which time  she was seen by neurologist in [place],  a  lumbar MRI was done in 2019 which showed degenerative changes at L5-S1 but no central canal or neuro foraminal  stenosis.  The lumbar MRI noted an incidental  mass In her pelvis  and led to her diagnosis and treatment of CA   She is concerned that she has not lost weight as advised by her former orthopedist   .  She has been  workign with a trainer for the past 2 months,  4 days per week using  weights  to strengthen her legs, For 30 minutes.  She walks on a treadmill for 10 minutes.     Diet reviewed:  bfast is cottage cheese w blueberries or an egg white omelet/toast. Or grits w/butter . Lunch is a 300 cal double egg biscuit.  Supper is salmon,  veggies.   She has lymphedema and pumps for about an hour per day.. she feels overwhelmed by all of her healthcare issues and the activities she is participating in "don't give me time to have a life."    Outpatient Medications Prior to Visit  Medication Sig Dispense Refill   cyanocobalamin (VITAMIN B12) 500 MCG tablet Take 500 mcg by mouth daily.     finasteride (PROSCAR) 5 MG tablet Take 5 mg by mouth daily.     furosemide (LASIX) 20 MG tablet TAKE 1 TABLET BY MOUTH EVERY DAY 90 tablet 1   levalbuterol (XOPENEX HFA) 45 MCG/ACT inhaler Inhale 2 puffs into the lungs every 6 (six) hours as needed for wheezing or shortness of breath. 1 each 3   Multiple Vitamins-Minerals (MULTIVITAMIN WITH MINERALS) tablet Take 1 tablet by mouth daily.     rosuvastatin (CRESTOR) 5 MG tablet Take 1 tablet (5 mg total) by mouth daily. 90 tablet 3   SYNTHROID 150 MCG tablet TAKE 1 TABLET (150 MCG TOTAL) BY MOUTH DAILY BEFORE BREAKFAST. 30 MIN BEFORE FOOD. YOU CAN TAKE 150 MCG QD ON SUNDAYS 1.5 TABLETS D/C 175 MCG 140 tablet 3   TRELEGY ELLIPTA 100-62.5-25 MCG/ACT AEPB TAKE 1 PUFF BY MOUTH EVERY DAY 60 each 11   Vibegron (GEMTESA) 75 MG TABS Take 1 tablet (75 mg total) by mouth daily. 90 tablet 3   Vitamin D, Ergocalciferol, (DRISDOL) 1.25 MG (50000 UNIT) CAPS capsule Take 1 capsule (50,000 Units total) by mouth once a week. D3 12 capsule 1  celecoxib (CELEBREX) 100 MG capsule Take 1 capsule (100 mg total) by mouth 2 (two) times daily. 60 capsule 2   No facility-administered medications prior to visit.    Review of Systems;  Patient denies headache, fevers, malaise, unintentional weight loss, skin rash, eye pain, sinus congestion and sinus pain, sore throat, dysphagia,  hemoptysis , cough, dyspnea, wheezing, chest pain, palpitations, orthopnea, edema, abdominal pain, nausea, melena, diarrhea, constipation, flank pain, dysuria, hematuria, urinary  Frequency, nocturia, numbness, tingling, seizures,  Focal weakness, Loss of consciousness,  Tremor, insomnia, depression, anxiety, and suicidal ideation.      Objective:  BP 100/62   Pulse 90   Temp (!) 97.5 F (36.4 C) (Oral)   Ht 5\' 6"  (1.676 m)   Wt 192 lb 6.4  oz (87.3 kg)   SpO2 94%   BMI 31.05 kg/m   BP Readings from Last 3 Encounters:  01/03/23 100/62  12/21/22 120/62  12/14/22 124/68    Wt Readings from Last 3 Encounters:  01/03/23 192 lb 6.4 oz (87.3 kg)  12/21/22 191 lb (86.6 kg)  12/21/22 191 lb 4 oz (86.8 kg)    Physical Exam Vitals reviewed.  Constitutional:      General: She is not in acute distress.    Appearance: Normal appearance. She is normal weight. She is not ill-appearing, toxic-appearing or diaphoretic.  HENT:     Head: Normocephalic.  Eyes:     General: No scleral icterus.       Right eye: No discharge.        Left eye: No discharge.     Conjunctiva/sclera: Conjunctivae normal.  Cardiovascular:     Rate and Rhythm: Normal rate and regular rhythm.     Heart sounds: Normal heart sounds.  Pulmonary:     Effort: Pulmonary effort is normal. No respiratory distress.     Breath sounds: Normal breath sounds.  Musculoskeletal:     Right hip: Decreased range of motion (pain with internal and external rotation).     Left hip: Crepitus present.  Skin:    General: Skin is warm and dry.  Neurological:     General: No focal deficit present.     Mental Status: She is alert and oriented to person, place, and time. Mental status is at baseline.  Psychiatric:        Mood and Affect: Mood normal.        Behavior: Behavior normal.        Thought Content: Thought content normal.        Judgment: Judgment normal.    Lab Results  Component Value Date   HGBA1C 5.3 05/09/2018    Lab Results  Component Value Date   CREATININE 0.94 01/03/2023   CREATININE 0.80 10/13/2022   CREATININE 0.74 02/15/2022    Lab Results  Component Value Date   WBC 6.6 11/18/2021   HGB 13.3 11/18/2021   HCT 41.2 11/18/2021   PLT 311 11/18/2021   GLUCOSE 89 01/03/2023   CHOL 202 (H) 12/14/2022   TRIG 87.0 12/14/2022   HDL 68.00 12/14/2022   LDLDIRECT 124.0 12/14/2022   LDLCALC 116 (H) 12/14/2022   ALT 19 01/03/2023   AST 18  01/03/2023   NA 143 01/03/2023   K 4.2 01/03/2023   CL 110 01/03/2023   CREATININE 0.94 01/03/2023   BUN 27 (H) 01/03/2023   CO2 27 01/03/2023   TSH 2.63 12/14/2022   HGBA1C 5.3 05/09/2018    MM 3D SCREEN BREAST BILATERAL  Result Date:  11/23/2022 CLINICAL DATA:  Screening. EXAM: DIGITAL SCREENING BILATERAL MAMMOGRAM WITH TOMOSYNTHESIS AND CAD TECHNIQUE: Bilateral screening digital craniocaudal and mediolateral oblique mammograms were obtained. Bilateral screening digital breast tomosynthesis was performed. The images were evaluated with computer-aided detection. COMPARISON:  Previous exam(s). ACR Breast Density Category b: There are scattered areas of fibroglandular density. FINDINGS: There are no findings suspicious for malignancy. IMPRESSION: No mammographic evidence of malignancy. A result letter of this screening mammogram will be mailed directly to the patient. RECOMMENDATION: Screening mammogram in one year. (Code:SM-B-01Y) BI-RADS CATEGORY  1: Negative. Electronically Signed   By: Frederico Hamman M.D.   On: 11/23/2022 12:39    Assessment & Plan:  .Lymphedema Assessment & Plan: Has been pumping once at  night . Advised to increase to twice daily    Myalgia due to statin -     CK  Hyperlipidemia, unspecified hyperlipidemia type -     Comprehensive metabolic panel  Bilateral hip joint arthritis Assessment & Plan: Adding tramadol 50 mg every 6 hours    Obesity (BMI 30.0-34.9) Assessment & Plan: Encourage to add 30 mintues of light aerobic exercise to her daily routine to lower BMI   Decreased calculated glomerular filtration rate (GFR) Assessment & Plan: Advised to suspend celebrex and refrain fro musing any other NSAIDs ,  and repeat a BMET in 2 weeks   Orders: -     Basic metabolic panel; Future  Other orders -     traMADol HCl; Take 1 tablet (50 mg total) by mouth every 6 (six) hours as needed for up to 7 days.  Dispense: 28 tablet; Refill: 0     I provided  31 minutes of face-to-face time during this encounter reviewing patient's last visit with me, patient's  most recent visit with orthopedics Deno Etienne, [facility] , Oct 2023) , vascular surgery,  previous  labs and imaging studies, counseling on currently addressed issues,  and post visit ordering of  therapeutics .   Follow-up: Return in about 4 weeks (around 01/31/2023).   Sherlene Shams, MD

## 2023-01-03 NOTE — Assessment & Plan Note (Signed)
Has been pumping once at  night . Advised to increase to twice daily

## 2023-01-03 NOTE — Patient Instructions (Addendum)
For your pain:  Continue celebrex and tylenol (2000 mg daily for tylenol)  add tramadol 50 mg every 6 hours  as needed for pain.   If pain is not controlled with this frequent dosing , notify me and I will upgrade to  opioid   Try adding 30 minutes of water exercise  daily    Pulmonary rehab may be  "more of the same" type of exercise you are already able to do at home.  I recommend going for at least one visit to see what they offer

## 2023-01-04 DIAGNOSIS — E669 Obesity, unspecified: Secondary | ICD-10-CM | POA: Insufficient documentation

## 2023-01-04 LAB — COMPREHENSIVE METABOLIC PANEL
ALT: 19 U/L (ref 0–35)
AST: 18 U/L (ref 0–37)
Albumin: 3.9 g/dL (ref 3.5–5.2)
Alkaline Phosphatase: 55 U/L (ref 39–117)
BUN: 27 mg/dL — ABNORMAL HIGH (ref 6–23)
CO2: 27 mEq/L (ref 19–32)
Calcium: 9.5 mg/dL (ref 8.4–10.5)
Chloride: 110 mEq/L (ref 96–112)
Creatinine, Ser: 0.94 mg/dL (ref 0.40–1.20)
GFR: 56.47 mL/min — ABNORMAL LOW (ref >60.00)
Glucose, Bld: 89 mg/dL (ref 70–99)
Potassium: 4.2 mEq/L (ref 3.5–5.1)
Sodium: 143 mEq/L (ref 135–145)
Total Bilirubin: 0.5 mg/dL (ref 0.2–1.2)
Total Protein: 6.3 g/dL (ref 6.0–8.3)

## 2023-01-04 LAB — CK: Total CK: 79 U/L (ref 7–177)

## 2023-01-04 NOTE — Assessment & Plan Note (Signed)
Adding tramadol 50 mg every 6 hours

## 2023-01-04 NOTE — Assessment & Plan Note (Signed)
Encourage to add 30 mintues of light aerobic exercise to her daily routine to lower BMI

## 2023-01-05 DIAGNOSIS — R944 Abnormal results of kidney function studies: Secondary | ICD-10-CM | POA: Insufficient documentation

## 2023-01-05 NOTE — Addendum Note (Signed)
Addended by: Sherlene Shams on: 01/05/2023 08:51 AM   Modules accepted: Orders

## 2023-01-05 NOTE — Assessment & Plan Note (Signed)
Advised to suspend celebrex and refrain fro musing any other NSAIDs ,  and repeat a BMET in 2 weeks

## 2023-01-07 ENCOUNTER — Other Ambulatory Visit: Payer: Self-pay | Admitting: Internal Medicine

## 2023-01-07 ENCOUNTER — Other Ambulatory Visit: Payer: Self-pay | Admitting: Family

## 2023-01-07 DIAGNOSIS — E559 Vitamin D deficiency, unspecified: Secondary | ICD-10-CM

## 2023-01-08 ENCOUNTER — Other Ambulatory Visit: Payer: Medicare Other

## 2023-01-08 ENCOUNTER — Telehealth: Payer: Self-pay | Admitting: Internal Medicine

## 2023-01-08 NOTE — Telephone Encounter (Signed)
noted 

## 2023-01-08 NOTE — Telephone Encounter (Signed)
Patient states she is following up on her previous message.  I spoke with Sandy Salaam, CMA, and she states Dr. Duncan Dull states patient must be seen.  I scheduled patient for an appointment tomorrow at [facility].

## 2023-01-08 NOTE — Telephone Encounter (Signed)
Pt called stating she fell outside on thursday and hit the concrete on her butt and would like a hip xray. Pt would like to be called

## 2023-01-09 ENCOUNTER — Ambulatory Visit (INDEPENDENT_AMBULATORY_CARE_PROVIDER_SITE_OTHER)
Admission: RE | Admit: 2023-01-09 | Discharge: 2023-01-09 | Disposition: A | Discharge status: Home / Self Care | Payer: Medicare Other | Source: Ambulatory Visit | Attending: Primary Care | Admitting: Primary Care

## 2023-01-09 ENCOUNTER — Ambulatory Visit (INDEPENDENT_AMBULATORY_CARE_PROVIDER_SITE_OTHER): Payer: Medicare Other | Admitting: Primary Care

## 2023-01-09 ENCOUNTER — Encounter: Payer: Self-pay | Admitting: Primary Care

## 2023-01-09 VITALS — BP 132/80 | HR 76 | Temp 96.9°F | Ht 66.0 in | Wt 192.0 lb

## 2023-01-09 DIAGNOSIS — M25551 Pain in right hip: Secondary | ICD-10-CM

## 2023-01-09 DIAGNOSIS — M533 Sacrococcygeal disorders, not elsewhere classified: Secondary | ICD-10-CM | POA: Diagnosis not present

## 2023-01-09 DIAGNOSIS — M1611 Unilateral primary osteoarthritis, right hip: Secondary | ICD-10-CM | POA: Diagnosis not present

## 2023-01-09 NOTE — Progress Notes (Addendum)
Subjective:    Patient ID: Melissa Anderson, female    DOB: 1941-06-04, 82 y.o.   MRN: [id number]  Fall Associated symptoms include numbness. Pertinent negatives include no headaches.    Melissa Anderson is a very pleasant 82 y.o. female patient of Dr. 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  Symptom onset five days ago after sustaining a fall. She was at her daughter house, walking down the steps from the garage, missed a step, and fell backwards. She was holding onto the rail which prevented her from hitting her head. She landed onto her buttocks and right hip.   She was ambulatory soon after falling, was able to drive back home. Since then she's noticed right buttocks and medial upper lower extremity pain. She's concerned about her pain as she is needing a hip replacement to the right hip from chronic osteoarthritis. She is experiencing right lower extremity numbness which is chronic from her neuropathy, no worse than usual.  She did take a Tramadol tablet at midnight this morning which has helped with her pain. She has an appointment with orthopedics scheduled for August.   Review of Systems  Respiratory:  Negative for shortness of breath.   Cardiovascular:  Negative for chest pain.  Musculoskeletal:  Positive for arthralgias.  Neurological:  Positive for numbness. Negative for dizziness and headaches.         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    04/2021 [facility]   Cancer (HCC) 2019   b/l ovaries and left fallopian tube pelvic lymph nodes negative    Guillain Barr syndrome (HCC)    2/2 flu shot 05/2018,  intubated x 8 days   Hyperlipidemia    Lymphedema    Overactive bladder    Thyroid disease    UTI (urinary tract infection)     Vitamin D deficiency     Social History   Socioeconomic History   Marital status: Widowed    Spouse name: Not on file   Number of children: Not on file   Years of education: Not on file   Highest education level: Not on file  Occupational History   Not on file  Tobacco Use   Smoking status: Former    Packs/day: 1.00    Years: 23.00    Additional pack years: 0.00    Total pack years: 23.00    Types: Cigarettes    Quit date: 32    Years since quitting: 37.3   Smokeless tobacco: Never  Vaping Use   Vaping Use: Never used  Substance and Sexual Activity   Alcohol use: Never   Drug use: Never   Sexual activity: Not Currently  Other Topics Concern   Not on file  Social History Narrative   IT trainer, bachelors degree    Lives in [place] here visiting daughter Melissa Anderson    Former smoker quit age 82 y.o    Social Determinants of Health   Financial Resource Strain: Low Risk  (12/21/2022)   Overall Financial Resource Strain (CARDIA)    Difficulty of Paying Living Expenses: Not hard at all  Food Insecurity: No Food Insecurity (12/21/2022)   Hunger Vital Sign    Worried About Running Out of Food in the Last Year: Never true  Ran Out of Food in the Last Year: Never true  Transportation Needs: No Transportation Needs (12/21/2022)   PRAPARE - Administrator, Civil Service (Medical): No    Lack of Transportation (Non-Medical): No  Physical Activity: Insufficiently Active (12/21/2022)   Exercise Vital Sign    Days of Exercise per Week: 3 days    Minutes of Exercise per Session: 30 min  Stress: No Stress Concern Present (12/21/2022)   [employer] of Occupational Health - Occupational Stress Questionnaire    Feeling of Stress : Not at all  Social Connections: Unknown (12/21/2022)   Social Connection and Isolation Panel [NHANES]    Frequency of Communication with Friends and Family: Not on file    Frequency of Social Gatherings with Friends and Family: More than three  times a week    Attends Religious Services: Not on file    Active Member of Clubs or Organizations: Not on file    Attends Banker Meetings: Not on file    Marital Status: Not on file  Intimate Partner Violence: Not At Risk (12/21/2022)   Humiliation, Afraid, Rape, and Kick questionnaire    Fear of Current or Ex-Partner: No    Emotionally Abused: No    Physically Abused: No    Sexually Abused: No    Past Surgical History:  Procedure Laterality Date   ABDOMINAL HYSTERECTOMY     02/15/18    CHOLECYSTECTOMY  2008   LAPAROSCOPIC VAGINAL HYSTERECTOMY WITH SALPINGO OOPHORECTOMY  02/15/2018   PORTACATH PLACEMENT Left 02/25/2018   Procedure: INSERTION PORT-A-CATH;  Surgeon: Earline Mayotte, MD;  Location: [facility];  Service: General;  Laterality: Left;   TOTAL ABDOMINAL HYSTERECTOMY W/ BILATERAL SALPINGOOPHORECTOMY  02/15/2018   lymph nodes removed    Family History  Problem Relation Age of Onset   Sudden death Mother 59       drowning in hurricane Katrina   Pancreatic cancer Father        deceased early 66s; smoker   Alcohol abuse Father    COPD Father    Melanoma Sister        deceased at 67   COPD Sister    Heart disease Brother        Angina   Coronary artery disease Brother    Heart disease Brother        Heart murmur   Prostate cancer Maternal Uncle        deceased 52   Breast cancer Paternal Aunt        unsure of this dx    Allergies  Allergen Reactions   Novocain [Procaine] Palpitations   Crestor [Rosuvastatin]     Myopathy     Influenza Vaccines     ?GBS 05/2018    Lipitor [Atorvastatin Calcium]     Myalgias      Current Outpatient Medications on File Prior to Visit  Medication Sig Dispense Refill   cyanocobalamin (VITAMIN B12) 500 MCG tablet Take 500 mcg by mouth daily.     finasteride (PROSCAR) 5 MG tablet Take 5 mg by mouth daily.     furosemide (LASIX) 20 MG tablet TAKE 1 TABLET BY MOUTH EVERY DAY 90 tablet 2   levalbuterol (XOPENEX  HFA) 45 MCG/ACT inhaler Inhale 2 puffs into the lungs every 6 (six) hours as needed for wheezing or shortness of breath. 1 each 3   Multiple Vitamins-Minerals (MULTIVITAMIN WITH MINERALS) tablet Take 1 tablet by mouth daily.     rosuvastatin (CRESTOR)  5 MG tablet Take 1 tablet (5 mg total) by mouth daily. 90 tablet 3   SYNTHROID 150 MCG tablet TAKE 1 TABLET (150 MCG TOTAL) BY MOUTH DAILY BEFORE BREAKFAST. 30 MIN BEFORE FOOD. YOU CAN TAKE 150 MCG QD ON SUNDAYS 1.5 TABLETS D/C 175 MCG 140 tablet 3   traMADol (ULTRAM) 50 MG tablet Take 1 tablet (50 mg total) by mouth every 6 (six) hours as needed for up to 7 days. 28 tablet 0   TRELEGY ELLIPTA 100-62.5-25 MCG/ACT AEPB TAKE 1 PUFF BY MOUTH EVERY DAY 60 each 11   Vibegron (GEMTESA) 75 MG TABS Take 1 tablet (75 mg total) by mouth daily. 90 tablet 3   Vitamin D, Ergocalciferol, (DRISDOL) 1.25 MG (50000 UNIT) CAPS capsule Take 1 capsule (50,000 Units total) by mouth once a week. D3 12 capsule 1   No current facility-administered medications on file prior to visit.    BP 132/80   Pulse 76   Temp (!) 96.9 F (36.1 C) (Temporal)   Ht 5\' 6"  (1.676 m)   Wt 192 lb (87.1 kg)   SpO2 96%   BMI 30.99 kg/m  Objective:   Physical Exam Constitutional:      General: She is not in acute distress. Cardiovascular:     Rate and Rhythm: Normal rate.  Pulmonary:     Effort: Pulmonary effort is normal.  Musculoskeletal:     Right hip: No deformity. Normal range of motion. Normal strength.     Comments: Normal ROM with flexion, extension, medial and lateral rotation of right hip while laying supine.  Ambulatory with cane.           Assessment & Plan:  Acute hip pain, right Assessment & Plan: Acute on chronic, post traumatic.  Exam today without obvious fracture. No alarm signs.  Checking plain films of right hip/pelvis and sacrum/coccyx today.  Continue Tramadol PRN.  Discussed other conservative care such as stretching, walking, etc.  Await  results.   Orders: -     DG HIP UNILAT W OR W/O PELVIS 2-3 VIEWS RIGHT -     DG Sacrum/Coccyx; Future        Doreene Nest, NP

## 2023-01-09 NOTE — Patient Instructions (Signed)
Complete xray(s) prior to leaving today. I will notify you of your results once received.  It was a pleasure meeting you!  

## 2023-01-09 NOTE — Addendum Note (Signed)
Addended by: Alvina Chou on: 01/09/2023 09:21 AM   Modules accepted: Orders

## 2023-01-09 NOTE — Assessment & Plan Note (Addendum)
Acute on chronic, post traumatic.  Exam today without obvious fracture. No alarm signs.  Checking plain films of right hip/pelvis and sacrum/coccyx today.  Continue Tramadol PRN.  Discussed other conservative care such as stretching, walking, etc.  Await results.

## 2023-01-13 ENCOUNTER — Other Ambulatory Visit: Payer: Self-pay | Admitting: Internal Medicine

## 2023-01-15 ENCOUNTER — Other Ambulatory Visit: Payer: Self-pay | Admitting: Internal Medicine

## 2023-01-15 ENCOUNTER — Telehealth: Payer: Self-pay | Admitting: Internal Medicine

## 2023-01-15 NOTE — Telephone Encounter (Signed)
Medication was discontinued on 12/21/2022. Is it okay to refuse?

## 2023-01-15 NOTE — Telephone Encounter (Signed)
Pt called stating she was prescribed tramadone hcl and she stated the medication worked so pt want more of the medication for 30 days

## 2023-01-15 NOTE — Telephone Encounter (Signed)
Pt called and stated that the Tramadol helped with her pain and would like a refill.

## 2023-01-16 MED ORDER — TRAMADOL HCL 50 MG PO TABS: 50.0000 mg | ORAL_TABLET | Freq: Three times a day (TID) | ORAL | 3 refills | Status: DC | PRN

## 2023-01-16 NOTE — Telephone Encounter (Signed)
LMTCB. Need to let pt know that medication was sent in.

## 2023-01-16 NOTE — Telephone Encounter (Signed)
Tramadol has been refilled for use up to 3 daily

## 2023-01-16 NOTE — Telephone Encounter (Signed)
Pt is aware.  

## 2023-01-16 NOTE — Telephone Encounter (Signed)
Pt called in not happy because she still waiting for her Tramadol refill. Note below from provider was read to her. As per pt, she fell and injured her hip so she was taking it 3x a day, and it did help. Pt would like this to be sent over to [facility] on [address].

## 2023-01-16 NOTE — Telephone Encounter (Signed)
Pt returned Shanda Bumps CMA call. Note below from provider was read to her. Pt aware.

## 2023-01-19 ENCOUNTER — Other Ambulatory Visit: Payer: Medicare Other

## 2023-01-31 ENCOUNTER — Inpatient Hospital Stay (HOSPITAL_BASED_OUTPATIENT_CLINIC_OR_DEPARTMENT_OTHER): Payer: Medicare Other | Admitting: Obstetrics and Gynecology

## 2023-01-31 ENCOUNTER — Encounter: Payer: Self-pay | Admitting: Obstetrics and Gynecology

## 2023-01-31 ENCOUNTER — Inpatient Hospital Stay: Payer: Medicare Other | Attending: Obstetrics and Gynecology

## 2023-01-31 VITALS — BP 121/65 | HR 86 | Temp 96.6°F | Resp 19 | Wt 187.2 lb

## 2023-01-31 DIAGNOSIS — M25551 Pain in right hip: Secondary | ICD-10-CM | POA: Diagnosis not present

## 2023-01-31 DIAGNOSIS — I890 Lymphedema, not elsewhere classified: Secondary | ICD-10-CM | POA: Diagnosis not present

## 2023-01-31 DIAGNOSIS — Z8543 Personal history of malignant neoplasm of ovary: Secondary | ICD-10-CM | POA: Diagnosis not present

## 2023-01-31 DIAGNOSIS — Z90710 Acquired absence of both cervix and uterus: Secondary | ICD-10-CM | POA: Diagnosis not present

## 2023-01-31 DIAGNOSIS — R32 Unspecified urinary incontinence: Secondary | ICD-10-CM | POA: Diagnosis not present

## 2023-01-31 DIAGNOSIS — Z9221 Personal history of antineoplastic chemotherapy: Secondary | ICD-10-CM | POA: Insufficient documentation

## 2023-01-31 DIAGNOSIS — Z6831 Body mass index (BMI) 31.0-31.9, adult: Secondary | ICD-10-CM | POA: Diagnosis not present

## 2023-01-31 DIAGNOSIS — Z90722 Acquired absence of ovaries, bilateral: Secondary | ICD-10-CM | POA: Diagnosis not present

## 2023-01-31 DIAGNOSIS — Z08 Encounter for follow-up examination after completed treatment for malignant neoplasm: Secondary | ICD-10-CM | POA: Insufficient documentation

## 2023-01-31 NOTE — Progress Notes (Signed)
Gynecologic Oncology Clinic Gynecologic Oncology Interval Visit  [facility]  [phone] [phone]  Patient Care Team: Sherlene Shams, MD as PCP - General (Internal Medicine) End, Cristal Deer, MD as PCP - Cardiology (Cardiology) Artelia Laroche, MD as Referring Physician (Obstetrics and Gynecology) Creig Hines, MD as Consulting Physician (Oncology) Benita Gutter, RN as Oncology Nurse Navigator Salena Saner, MD as Consulting Physician (Pulmonary Disease)   Name of the patient: Melissa Anderson  [id number]  [birth year]   Date of visit: 01/31/2023  Referring Provider: Dr Merlene Pulling  Chief Complaint: stage II ovarian cancer surveillance  Subjective:  Melissa CLEMMER is a 82 y.o. 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   Her CA125 was noted to be elevated at her last visit.  Component Ref Range & Units 3 mo ago (10/18/22) 9 mo ago (04/12/22) 1 yr ago (10/12/21) 1 yr ago (07/13/21) 2 yr ago (01/05/21) 2 yr ago (09/08/20) 2 yr ago (06/02/20)  Cancer Antigen (CA) 125 0.0 - 38.1 U/mL 31.1 18.4 CM 16.9 CM 18.2 CM 16.4 CM 16.8 CM 18.6 CM   She has persistent Hip pain-  and plans for hip replacement.    Gynecologic Oncology History:  She was at her PCP office in March and was complaining of intermittent weakness and pain shooting down her right leg. Her PCP ordered an MRI of her lumbosacral spine which demonstrated this right adnexal mass as an incidental finding.   Radiologic Imaging: 11/12/2017 Lumbosacral spine MRI scan noted a multicystic 4 x 5.5 cm right adnexal mass with fluid blood levels. Prominent uterine endometrial  stripe.   12/20/2017 Pelvic Ultrasound: Uterus measures 9.7 x 4.1 x 5.9cm and is retroverted. Endometrial stripe measures 9 mm. Within it, as outlines by a small amount of fluid, there is a 1.4 x 0.7 x 1.4 cm polypoid echogenic structure with internal blood flow and small cystic components. A similar appearing but smaller 5 x 4 x 3 mm echogenic nodular density is noted within the endometrial cavity as well. The uterus is otherwise normal in appearance. There is a multiloculated complex cystic structure in the right adnexa measuring 6.3 x 6.1 x 4.7 cm. This has solid appearing components as well as anechoic portions, separated by thick septations. On MRI, many of these loculations demonstrate fluid-filled levels. No normal ovarian parenchyma is identified. The left ovary was not seen. There is no pelvic free fluid.  She subsequently underwent an EMB with her gynecologist and this was negative.   She apparently was not aware that she had a cyst this size until she was seen by her provider who recommended Gyn Oncology consultation for further evaluation. She was seen by Dr. Darene Lamer who recommended surgery and further imaging and tumor markers.   02/05/2018: CA 125 = 18, CA 19-9 = 8, CEA <0.5, OVA1 = 4 (low risk)  02/09/2018 CT Scan A/P: multi-septated complex cystic lesion noted in the right pelvis.adnexa immediately superior to the uterus which measures approximately 6.9 x 5.3 am in greatest axial dimensions and approximately 5.4 cm in greatest cranial caudal dimensions.  Abnormal thickening of the endometrial cavity which measures 1.3 cm in greatest width.  Multiple peripelvic cysts are identified in bilateral renal hilar regions measuring  up to 3.6 cm on the right and 2.9 cm on the left.  On 02/15/18, she underwent TLH, BSO, with bilateral pelvic LN dissection, peritoneal biopsies, and omental biopsy with Dr. Cherylynn Ridges at [facility]. No spread beyond tube and ovaries.  She was discharged on POD1 w/o complication.     Pathology A. 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  See the synoptic report. Note: The tumor is positive for PAX8, WT1, and p53 (strong, uniform). Based on the presence of tubal intraepithelial carcinoma, this is considered a tubal primary.  B.  Endometrium, curettage: Fragments of endometrial polyp.  C. Uterus, total hysterectomy:   Uterus:    Endometrium: Endometrial polyp with focal glandular crowding, see note.    Myometrium: Adenomyosis, leiomyomata (up to 1 cm).    Cervix: No pathologic diagnosis.     Serosa: No pathologic diagnosis.  Note: The areas of endometrial glandular crowding are not cytologically distinct from the background and therefore do not meet the criteria for endometrial intraepithelial neoplasia (EIN).  D. Right pelvic lymph nodes, lymphadenectomy: Three lymph nodes, negative for malignancy (0/3).   E. Left pelvic lymph nodes, lymphadenectomy: Three lymph nodes, negative for malignancy (0/3).   F. Omentum, omentectomy: Benign omentum.  G. Peritoneal biopsy #1: Benign fibromuscular tissue.  H. Peritoneal biopsy #2: Benign fibrous tissue.  I. Peritoneal biopsy #3: Benign fibroadipose tissue.  J. Peritoneal biopsy #4: Benign fibroadipose tissue.  A. Pelvic Washings: - Negative. No Evidence of Malignancy  03/21/18. She was referred to Dr. Smith Robert, medical-oncology at [facility] and initiated cycle 1 of adjuvant carbo-Taxol on 03/21/18.   05/28/2018 Patient received cycle 4 of chemotherapy on . After 4 cycles of chemotherapy patient developed progressive weakness in her bilateral lower extremities and was admitted to the hospital in [place] where she developed worsening respiratory failure and had to be intubated was airlifted to [place].  Patient was eventually  extubated.  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  She was given IVIG as well.  Of note she has received flu shot consistently in the past and has never had these complications ever. Patient was then sent to a neuro rehab unit and was eventually discharged home.   08/16/2018 - CT C/A/P IMPRESSION: 1. Status post hysterectomy and bilateral salpingo oophorectomy. No findings for metastatic ovarian cancer. No peritoneal surface or omental disease or abdominal/pelvic lymphadenopathy. 2. Mild chronic scarring changes in the lungs but no findings worrisome for pulmonary metastatic disease.  She has had her covid vaccines without complication.   Imaging was 06/01/20 which was negative for metastatic disease.   03/04/2021 CT C/A/P IMPRESSION: 1. Stable CTs of the chest, abdomen and pelvis status post hysterectomy. No evidence of local recurrence or metastatic disease. 2. Stable pulmonary scarring and chronic central airway thickening. 3. Stable incidental findings including renal sinus parapelvic cysts, probable pelvic floor laxity and mild Aortic Atherosclerosis (ICD10-I70.0).  08/16/2021 IMPRESSION: 1. Bilateral parapelvic renal cysts. 2. Severe right hip degenerative changes. 3.  Aortic Atherosclerosis (ICD10-I70.0).  10/19/2020 RECOMMENDATION: Screening mammogram in one year.(Code:[id number])  BI-RADS CATEGORY  1: Negative.  10/13/22  CT C/A/P IMPRESSION: 1. Stable examination without evidence of local recurrence or metastatic disease within the chest, abdomen, or pelvis. 2. Small hiatal  hernia with gas fluid levels in the esophagus suggestive of gastroesophageal reflux. 3. Moderate volume of formed stool throughout the colon. Correlate for constipation. 4. Pelvic floor laxity with a small cystocele, vaginal prolapse and probable rectal  prolapse. 5.  Aortic Atherosclerosis (ICD10-I70.0).   CA 125 (not elevated at time of diagnosis) 10/18/2022 31.1 04/12/2022 18.4 10/12/2021 16.9 07/13/2021 18.2 01/05/21 16.4 09/08/2020 16.8 06/02/20 18.6 02/11/20 16 10/09/2018 16.9 02/05/2018 18 (diagnosis)    Other medical issues She has a h/o prior breast mass and she requested we order her mammogram done on 11/22/2022 IMPRESSION: BI-RADS CATEGORY  1: Negative. RECOMMENDATION: Screening mammogram in one year  Genetic Testing:  - [employer] 83 gene Multi-Cancer panel was negative for pathogenic mutation in any genes. No VUS detected.   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   Aortic atherosclerosis (HCC) 07/14/2020   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   History of ovarian cancer 02/11/2020   Acute pain of right shoulder 08/15/2018   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   Vitamin D deficiency 05/08/2018   Neuropathy 05/08/2018   Overactive bladder 05/08/2018   Bilateral leg edema 05/08/2018   HLD (hyperlipidemia) 05/08/2018   Goals of care, counseling/discussion 02/25/2018   Ovarian cancer (HCC) 02/22/2018   Graves' disease 02/13/2018    Past Medical History: 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    04/2021 [facility]   Cancer (HCC) 2019   b/l  ovaries and left fallopian tube pelvic lymph nodes negative    Guillain Barr syndrome (HCC)    2/2 flu shot 05/2018,  intubated x 8 days   Hyperlipidemia    Lymphedema    Overactive bladder    Thyroid disease    UTI (urinary tract infection)    Vitamin D deficiency     Past Surgical History: Past Surgical History:  Procedure Laterality Date   ABDOMINAL HYSTERECTOMY     02/15/18    CHOLECYSTECTOMY  2008   LAPAROSCOPIC VAGINAL HYSTERECTOMY WITH SALPINGO OOPHORECTOMY  02/15/2018   PORTACATH PLACEMENT Left 02/25/2018   Procedure: INSERTION PORT-A-CATH;  Surgeon: Earline Mayotte, MD;  Location: [facility] ORS;  Service: General;  Laterality: Left;   TOTAL ABDOMINAL HYSTERECTOMY W/ BILATERAL SALPINGOOPHORECTOMY  02/15/2018   lymph nodes removed    OB History:  OB History  Gravida Para Term Preterm AB Living  2         2  SAB IAB Ectopic Multiple Live Births               # Outcome Date GA Lbr Len/2nd Weight Sex Delivery Anes PTL Lv  2 Gravida           1 Gravida             Obstetric Comments  Menstrual age: 82    Age 1st Pregnancy: 59  Family History: Family History  Problem Relation Age of Onset   Sudden death Mother 22       drowning in hurricane Katrina   Pancreatic cancer Father        deceased early 33s; smoker   Alcohol abuse Father    COPD Father    Melanoma Sister        deceased at 18   COPD Sister    Heart disease Brother        Angina   Coronary artery disease Brother    Heart disease Brother        Heart murmur   Prostate cancer Maternal Uncle        deceased 36   Breast cancer Paternal Aunt        unsure of this dx    Social History: Social History   Socioeconomic History   Marital status: Widowed    Spouse name: Not on file   Number of children: Not on file   Years of education: Not on file   Highest education level: Not on file  Occupational History   Not on file  Tobacco Use   Smoking status: Former    Packs/day: 1.00    Years:  23.00    Additional pack years: 0.00    Total pack years: 23.00    Types: Cigarettes    Quit date: 63    Years since quitting: 37.4   Smokeless tobacco: Never  Vaping Use   Vaping Use: Never used  Substance and Sexual Activity   Alcohol use: Never   Drug use: Never   Sexual activity: Not Currently  Other Topics Concern   Not on file  Social History Occupational hygienist, bachelors degree    Lives in [place] here visiting daughter [name]    Former smoker quit age 26 y.o    Social Determinants of Health   Financial Resource Strain: Low Risk  (12/21/2022)   Overall Financial Resource Strain (CARDIA)    Difficulty of Paying Living Expenses: Not hard at all  Food Insecurity: No Food Insecurity (12/21/2022)   Hunger Vital Sign    Worried About Running Out of Food in the Last Year: Never true    Ran Out of Food in the Last Year: Never true  Transportation Needs: No Transportation Needs (12/21/2022)   PRAPARE - Administrator, Civil Service (Medical): No    Lack of Transportation (Non-Medical): No  Physical Activity: Insufficiently Active (12/21/2022)   Exercise Vital Sign    Days of Exercise per Week: 3 days    Minutes of Exercise per Session: 30 min  Stress: No Stress Concern Present (12/21/2022)   [employer] of Occupational Health - Occupational Stress Questionnaire    Feeling of Stress : Not at all  Social Connections: Unknown (12/21/2022)   Social Connection and Isolation Panel [NHANES]    Frequency of Communication with Friends and Family: Not on file    Frequency of Social Gatherings with Friends and Family: More than three times a week    Attends Religious Services: Not on file    Active Member of Clubs or Organizations: Not on file    Attends Banker Meetings: Not on file    Marital Status: Not on file  Intimate Partner Violence: Not At Risk (12/21/2022)   Humiliation, Afraid, Rape, and Kick questionnaire    Fear of Current or Ex-Partner: No     Emotionally Abused: No    Physically Abused:  No    Sexually Abused: No   Immunization History  Administered Date(s) Administered   Influenza, High Dose Seasonal PF 05/31/2018   Influenza-Unspecified 05/13/2018   PFIZER Comirnaty(Gray Top)Covid-19 Tri-Sucrose Vaccine 11/15/2020   PFIZER(Purple Top)SARS-COV-2 Vaccination 02/09/2020, 03/02/2020   Allergies: Allergies  Allergen Reactions   Novocain [Procaine] Palpitations   Crestor [Rosuvastatin]     Myopathy     Influenza Vaccines     ?GBS 05/2018    Lipitor [Atorvastatin Calcium]     Myalgias      Review of Systems General: no complaints  HEENT: no complaints  Lungs: Shortness of breath chronic  Cardiac: no complaints  GI: no complaints  GU: no complaints  Musculoskeletal: Hip pain and Back pain   Extremities: Leg swelling chronic  Skin: no complaints  Neuro: Peripheral neuropathy complaints  Endocrine: no complaints  Psych: no complaints        Objective:  Physical Examination: BP 121/65   Pulse 86   Temp (!) 96.6 F (35.9 C)   Resp 19   Wt 187 lb 3.2 oz (84.9 kg)   SpO2 98%   BMI 30.21 kg/m     GENERAL: Patient is a well appearing female in no acute distress; she is walking with a cane HEENT:  Atraumatic and normocephalic.  NODES:  No cervical, supraclavicular, axillary, or inguinal lymphadenopathy palpated.  LUNGS: Normal respiratory effort ABDOMEN:  Soft, nontender. Nondistended. No masses/ascites/hernia/or hepatomegaly.  EXTREMITIES: Bilateral symmetrical peripheral edema NEURO:  Nonfocal. Well oriented.  Appropriate affect.  Pelvic: EGBUS: no lesions Cervix: surgically absent Vagina: no lesions on palpation Uterus: surgically absent BME: no palpable masses   IMAGING As per HPI  Assessment:  Melissa Anderson is a 82 y.o. 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 Cherylynn Ridges. She has initiated chemotherapy 03/21/2018 with carbo-Taxol and is treated by Dr. Smith Robert at [facility]. Chemotherapy discontinued after 05/28/2018 due to GBS from flu shot. Clinically NED. Radiographically NED in 09/2022. CA 125  elevated, asymptomatic overall reassuring exam and visit.  Peripheral neuropathy. - post chemotherapy  Symptomatic bilateral lymphedema, possibly secondary to surgery.   She has completed germline genetic testing which was negative for mutation.   Hip Pain- secondary to severe degenerative changes.  Orthopedic visit pending  Urinary incontinence- stable  Medical co-morbidities complicating care: BMI of 31 Plan:   CA125 pending today. Clinically she is asymptomatic.  Continue to follow closely given the rise in CA125.  This point we will plan on following up in 3 months however if her CA125 has normalized we can consider physical exam including pelvic every 6 months until five years from completing treatment which would be October 2024. Then annually thereafter.   Somatic testing was not recommended at this time because she does not have a BRCA mutation and is not a candidate for maintenance PARP inhibitor after completing primary chemotherapy.  Somatic testing for mutation and HRD can be considered should she have recurrence.   Lymphedema and other medical issues-continue to follow-up with prior providers.   Hip pain- she requested advise on how to see for hip replacement.  Orthopedic appointment pending at [facility] (August 2024).   Urinary incontinence -continue to follow with urogynecology  I have provided her names of local physicians for primary care services and dermatologist for annual total body skin cancer screening.   I discussed the assessment and treatment plan with  the patient. The patient was provided an opportunity to ask questions and all were answered. The patient agreed with the plan and demonstrated an understanding of the  instructions.   Marvetta Vohs Leta Jungling, MD

## 2023-02-01 LAB — CA 125: Cancer Antigen (CA) 125: 21.2 U/mL (ref 0.0–38.1)

## 2023-02-02 ENCOUNTER — Other Ambulatory Visit (INDEPENDENT_AMBULATORY_CARE_PROVIDER_SITE_OTHER): Payer: Medicare Other

## 2023-02-02 DIAGNOSIS — E785 Hyperlipidemia, unspecified: Secondary | ICD-10-CM

## 2023-02-02 DIAGNOSIS — R944 Abnormal results of kidney function studies: Secondary | ICD-10-CM

## 2023-02-02 LAB — BASIC METABOLIC PANEL
BUN: 33 mg/dL — ABNORMAL HIGH (ref 6–23)
CO2: 31 mEq/L (ref 19–32)
Calcium: 9.5 mg/dL (ref 8.4–10.5)
Chloride: 104 mEq/L (ref 96–112)
Creatinine, Ser: 0.83 mg/dL (ref 0.40–1.20)
GFR: 65.53 mL/min (ref >60.00)
Glucose, Bld: 91 mg/dL (ref 70–99)
Potassium: 4.5 mEq/L (ref 3.5–5.1)
Sodium: 142 mEq/L (ref 135–145)

## 2023-02-02 LAB — CK: Total CK: 131 U/L (ref 7–177)

## 2023-02-05 DIAGNOSIS — H2513 Age-related nuclear cataract, bilateral: Secondary | ICD-10-CM | POA: Diagnosis not present

## 2023-02-07 ENCOUNTER — Telehealth: Payer: Self-pay | Admitting: Obstetrics and Gynecology

## 2023-02-07 NOTE — Telephone Encounter (Signed)
The patient was contacted about her CA125 results which are very reassuring and have decreased.  We can change her appointment to a follow-up in 6 months.  I tried to call her and there was no answer.  I left her message regarding reassuring CA125 values.  Devynn Scheff Leta Jungling, MD

## 2023-02-20 ENCOUNTER — Other Ambulatory Visit
Admission: RE | Admit: 2023-02-20 | Discharge: 2023-02-20 | Disposition: A | Discharge status: Home / Self Care | Payer: Medicare Other | Attending: Internal Medicine | Admitting: Internal Medicine

## 2023-02-20 DIAGNOSIS — R0602 Shortness of breath: Secondary | ICD-10-CM | POA: Diagnosis not present

## 2023-02-20 DIAGNOSIS — E785 Hyperlipidemia, unspecified: Secondary | ICD-10-CM | POA: Diagnosis not present

## 2023-02-20 DIAGNOSIS — Z79899 Other long term (current) drug therapy: Secondary | ICD-10-CM | POA: Insufficient documentation

## 2023-02-20 LAB — LIPID PANEL
Cholesterol: 151 mg/dL (ref 0–200)
HDL: 67 mg/dL (ref >40)
LDL Cholesterol: 75 mg/dL (ref 0–99)
Total CHOL/HDL Ratio: 2.3 RATIO
Triglycerides: 43 mg/dL (ref <150)
VLDL: 9 mg/dL (ref 0–40)

## 2023-02-20 LAB — COMPREHENSIVE METABOLIC PANEL
ALT: 15 U/L (ref 0–44)
AST: 19 U/L (ref 15–41)
Albumin: 3.8 g/dL (ref 3.5–5.0)
Alkaline Phosphatase: 62 U/L (ref 38–126)
Anion gap: 7 (ref 5–15)
BUN: 23 mg/dL (ref 8–23)
CO2: 26 mmol/L (ref 22–32)
Calcium: 8.8 mg/dL — ABNORMAL LOW (ref 8.9–10.3)
Chloride: 106 mmol/L (ref 98–111)
Creatinine, Ser: 0.73 mg/dL (ref 0.44–1.00)
GFR, Estimated: >60 mL/min (ref >60)
Glucose, Bld: 90 mg/dL (ref 70–99)
Potassium: 3.8 mmol/L (ref 3.5–5.1)
Sodium: 139 mmol/L (ref 135–145)
Total Bilirubin: 0.9 mg/dL (ref 0.3–1.2)
Total Protein: 6.4 g/dL — ABNORMAL LOW (ref 6.5–8.1)

## 2023-02-20 LAB — CK: Total CK: 73 U/L (ref 38–234)

## 2023-02-27 ENCOUNTER — Other Ambulatory Visit: Payer: Self-pay | Admitting: Internal Medicine

## 2023-02-27 DIAGNOSIS — E559 Vitamin D deficiency, unspecified: Secondary | ICD-10-CM

## 2023-02-27 NOTE — Telephone Encounter (Signed)
Please refuse refill request. Spoke with pharmacist at [facility] and they filled a rx today and have another refill on file.

## 2023-02-28 MED ORDER — VITAMIN D (ERGOCALCIFEROL) 1.25 MG (50000 UNIT) PO CAPS: 50000.0000 [IU] | ORAL_CAPSULE | ORAL | 1 refills | Status: DC

## 2023-02-28 NOTE — Telephone Encounter (Signed)
Pt called in stating that she needs her Tramadol and Vitamin D as well sent over to [facility]. Note below was read to her and she stated she takes Tramadol 3x a day

## 2023-02-28 NOTE — Telephone Encounter (Signed)
Meds sent to pharmacy.

## 2023-03-15 ENCOUNTER — Ambulatory Visit: Payer: Medicare Other | Admitting: Pulmonary Disease

## 2023-03-15 ENCOUNTER — Encounter: Payer: Self-pay | Admitting: Pulmonary Disease

## 2023-03-15 VITALS — BP 122/72 | HR 83 | Temp 98.0°F | Ht 66.0 in | Wt 186.4 lb

## 2023-03-15 DIAGNOSIS — J4489 Other specified chronic obstructive pulmonary disease: Secondary | ICD-10-CM | POA: Diagnosis not present

## 2023-03-15 DIAGNOSIS — K449 Diaphragmatic hernia without obstruction or gangrene: Secondary | ICD-10-CM

## 2023-03-15 DIAGNOSIS — K219 Gastro-esophageal reflux disease without esophagitis: Secondary | ICD-10-CM

## 2023-03-15 DIAGNOSIS — R0602 Shortness of breath: Secondary | ICD-10-CM | POA: Diagnosis not present

## 2023-03-15 DIAGNOSIS — I5032 Chronic diastolic (congestive) heart failure: Secondary | ICD-10-CM | POA: Diagnosis not present

## 2023-03-15 MED ORDER — TRELEGY ELLIPTA 100-62.5-25 MCG/ACT IN AEPB: 1.0000 | INHALATION_SPRAY | Freq: Every day | RESPIRATORY_TRACT | Status: DC

## 2023-03-15 NOTE — Patient Instructions (Signed)
Make sure you carry your emergency inhaler with you in your purse in case you have issues with shortness of breath.  Continue using your Trelegy 100, 1 puff daily.  Make sure you rinse your mouth well after you use it.  We will see him in follow-up in 4 to 6 months time call sooner should any new problems arise.

## 2023-03-15 NOTE — Progress Notes (Signed)
Subjective:    Patient ID: Melissa Anderson, female    DOB: 01-14-1941, 82 y.o.   MRN: [id number]  Patient Care Team: Sherlene Shams, MD as PCP - General (Internal Medicine) End, Cristal Deer, MD as PCP - Cardiology (Cardiology) Artelia Laroche, MD as Referring Physician (Obstetrics and Gynecology) Creig Hines, MD as Consulting Physician (Oncology) Benita Gutter, RN as Oncology Nurse Navigator Salena Saner, MD as Consulting Physician (Pulmonary Disease)  Chief Complaint  Patient presents with   Follow-up    No current sx.     HPI Melissa Anderson is an 82 year old former smoker (23 PY) who presents for follow-up on the issue of asthmatic bronchitis and shortness of breath.  She was last seen here on 11 December 2022.  She has been doing well on Trelegy Ellipta and as needed levo albuterol.  She has not required use of the levo albuterol at all since her last visit.  She is being managed by cardiology for chronic diastolic heart failure, she is on furosemide and this has helped with her sensation of shortness of breath as well.   She has had no orthopnea or paroxysmal nocturnal dyspnea.  No increased lower extremity edema, this has actually been better since she started furosemide.  No chest pain, no calf tenderness.  No wheezing noted.  She had a recent CT chest abdomen and pelvis on 13 October 2022 to evaluate for potential recurrence of ovarian cancer.  This did not show any recurrence.  It does show a hiatal hernia and she was actually refluxing during the time of the test.  ENT has also expressed that she does have issues with reflux and has instituted antireflux measures.  She is not on any acid reducers.  He was given a trial of proton her last visit however.  Her only complaints today are of generalized joint stiffness that has been plaguing her for several years now and right lower extremity pain related to hip issues.  She is to see orthopedics later in the month.    Review of Systems A 10 point review of systems was performed and it is as noted above otherwise negative.   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   Aortic atherosclerosis (HCC) 07/14/2020   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   History of ovarian cancer 02/11/2020   Acute pain of right shoulder 08/15/2018   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   Vitamin D deficiency 05/08/2018   Neuropathy 05/08/2018   Overactive bladder 05/08/2018   Bilateral leg edema 05/08/2018   HLD (hyperlipidemia) 05/08/2018   Goals of care, counseling/discussion 02/25/2018   Ovarian cancer (HCC) 02/22/2018   Graves' disease 02/13/2018    Social History   Tobacco Use   Smoking status: Former    Current packs/day: 0.00    Average packs/day: 1 pack/day for 23.0 years (23.0 ttl pk-yrs)    Types: Cigarettes    Start date: 1964    Quit date: 1987  Years since quitting: 37.5   Smokeless tobacco: Never  Substance Use Topics   Alcohol use: Never    Allergies  Allergen Reactions   Novocain [Procaine] Palpitations   Crestor [Rosuvastatin]     Myopathy     Influenza Vaccines     ?GBS 05/2018    Lipitor [Atorvastatin Calcium]     Myalgias      Current Meds  Medication Sig   celecoxib (CELEBREX) 100 MG capsule Take 100 mg by mouth 2 (two) times daily.   cyanocobalamin (VITAMIN B12) 500 MCG tablet Take 500 mcg by mouth daily.    finasteride (PROSCAR) 5 MG tablet Take 5 mg by mouth daily.   furosemide (LASIX) 20 MG tablet TAKE 1 TABLET BY MOUTH EVERY DAY   levalbuterol (XOPENEX HFA) 45 MCG/ACT inhaler Inhale 2 puffs into the lungs every 6 (six) hours as needed for wheezing or shortness of breath.   Multiple Vitamins-Minerals (MULTIVITAMIN WITH MINERALS) tablet Take 1 tablet by mouth daily.   rosuvastatin (CRESTOR) 5 MG tablet Take 1 tablet (5 mg total) by mouth daily.   SYNTHROID 150 MCG tablet TAKE 1 TABLET (150 MCG TOTAL) BY MOUTH DAILY BEFORE BREAKFAST. 30 MIN BEFORE FOOD. YOU CAN TAKE 150 MCG QD ON SUNDAYS 1.5 TABLETS D/C 175 MCG   traMADol (ULTRAM) 50 MG tablet Take 1 tablet (50 mg total) by mouth every 8 (eight) hours as needed.   TRELEGY ELLIPTA 100-62.5-25 MCG/ACT AEPB TAKE 1 PUFF BY MOUTH EVERY DAY   Vibegron (GEMTESA) 75 MG TABS Take 1 tablet (75 mg total) by mouth daily.   Vitamin D, Ergocalciferol, (DRISDOL) 1.25 MG (50000 UNIT) CAPS capsule Take 1 capsule (50,000 Units total) by mouth once a week. D3    Immunization History  Administered Date(s) Administered   Influenza, High Dose Seasonal PF 05/31/2018   Influenza-Unspecified 05/13/2018   PFIZER Comirnaty(Gray Top)Covid-19 Tri-Sucrose Vaccine 11/15/2020   PFIZER(Purple Top)SARS-COV-2 Vaccination 02/09/2020, 03/02/2020        Objective:     BP 122/72 (BP Location: Left Arm, Cuff Size: Normal)   Pulse 83   Temp 98 F (36.7 C) (Temporal)   Ht 5\' 6"  (1.676 m)   Wt 186 lb 6.4 oz (84.6 kg)   SpO2 95%   BMI 30.09 kg/m   SpO2: 95 % O2 Device: None (Room air)  GENERAL: Well-developed, well-nourished, looks younger than stated age.  Ambulates with assistance of a cane.  No conversational dyspnea. HEAD: Normocephalic, atraumatic.  EYES: Pupils equal, round, reactive to light.  No scleral icterus.  MOUTH: Oral mucosa moist.  No thrush. NECK: Supple. No thyromegaly. Trachea midline. No JVD.  No adenopathy. PULMONARY: Good air entry bilaterally.  No  adventitious sounds. CARDIOVASCULAR: S1 and S2. Regular rate and rhythm.  No rubs, murmurs or gallops heard. ABDOMEN: Benign. MUSCULOSKELETAL: No joint deformity, no clubbing, +1 edema .  Prominent varicose veins of the lower extremities. NEUROLOGIC: No overt focal deficit, no gait disturbance, speech is fluent. SKIN: Intact,warm,dry. PSYCH: Mood and behavior normal.      Assessment & Plan:     ICD-10-CM   1. Asthmatic bronchitis , chronic (HCC) - moderate  J44.89    Continue Trelegy Ellipta 1 inhalation daily Continue as needed levo albuterol    2. Chronic diastolic heart failure (HCC)  O75.64    This issue adds complexity to her management On diuretics Follows with cardiology    3. SOB (shortness of breath)  R06.02    Markedly improved Multifactorial: Asthmatic bronchitis/diastolic heart failure  Advanced age/deconditioning    4. Hiatal hernia with GERD  K44.9    K21.9    She is following antireflux measures Discontinued PPI on her own     From the respiratory standpoint, Melissa Anderson is very stable.  She is to continue her current regimen.  We will see her in follow-up and 4 to 6 months time she is to call sooner should any new problems arise.   Gailen Shelter, MD Advanced Bronchoscopy PCCM [facility]    *This note was dictated using voice recognition software/Dragon.  Despite best efforts to proofread, errors can occur which can change the meaning. Any transcriptional errors that result from this process are unintentional and may not be fully corrected at the time of dictation.

## 2023-03-19 ENCOUNTER — Ambulatory Visit: Payer: Medicare Other | Admitting: Internal Medicine

## 2023-03-26 ENCOUNTER — Telehealth (INDEPENDENT_AMBULATORY_CARE_PROVIDER_SITE_OTHER): Payer: Medicare Other | Admitting: Internal Medicine

## 2023-03-26 ENCOUNTER — Encounter: Payer: Self-pay | Admitting: Internal Medicine

## 2023-03-26 VITALS — Ht 66.0 in | Wt 186.0 lb

## 2023-03-26 DIAGNOSIS — I890 Lymphedema, not elsewhere classified: Secondary | ICD-10-CM

## 2023-03-26 DIAGNOSIS — R944 Abnormal results of kidney function studies: Secondary | ICD-10-CM

## 2023-03-26 DIAGNOSIS — M1611 Unilateral primary osteoarthritis, right hip: Secondary | ICD-10-CM

## 2023-03-26 NOTE — Assessment & Plan Note (Signed)
Her pain is secondary to severe degenerative changes of the bilateral hips, right worse than left. Degree of arthropathy has progressed on both hips since 2022. Prominent subchondral cysti changes in the right femoral head. Refill history confirmed via  Controlled Substance databas, accessed by me today.. Continue use of tramadol every 8 hours for ain management until  her arthroplasty occurs

## 2023-03-26 NOTE — Assessment & Plan Note (Signed)
Has been pumping twice daily

## 2023-03-26 NOTE — Progress Notes (Signed)
Telephone Visit Note   This format is felt to be most appropriate for this patient at this time.  All issues noted in this document were discussed and addressed.  No physical exam was performed (except for noted visual exam findings with Video Visits).   I attempted to connect  with Melissa Anderson  on 03/26/23 at  2:00 PM EDT by a video enabled telemedicine application  and verified that I am speaking with the correct person using two identifiers. Location patient: home Location provider: work or home office Persons participating in the virtual visit: patient, provider  I discussed the limitations, risks, security and privacy concerns of performing an evaluation and management service by telephone and the availability of in person appointments. I also discussed with the patient that there may be a patient responsible charge related to this service. The patient expressed understanding and agreed to proceed.  Interactive audio and video telecommunications were attempted between this provider and patient, however failed, due to patient having technical difficulties .  We continued and completed visit with audio only.   Reason for visit: follow up on chronic issues   HPI:  Melissa Anderson is an 82 yr old female with a history of ovarian CA,  chemotherapy induced peripheral neuropathy and Guillan Barre syndrome who presents for follow up on management of persistent disabling right hip pain.  Her hip pain has persisted   despite going to PT, trying acupuncture, and personal training.  Her trainer recommended that she ask for a steroid injection .  Sees orthopedics  in August and is aware that an intraarticular steroid injection will delay arthroplasty .  She has been using tramadol  every 8 hours since April to make the pain tolerable.    ROS: See pertinent positives and negatives per HPI.  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    04/2021 [facility]   Cancer (HCC) 2019   b/l ovaries and left fallopian tube pelvic lymph nodes negative    Guillain Barr syndrome (HCC)    2/2 flu shot 05/2018,  intubated x 8 days   Hyperlipidemia    Lymphedema    Overactive bladder    Thyroid disease    UTI (urinary tract infection)    Vitamin D deficiency     Past Surgical History:  Procedure Laterality Date   ABDOMINAL HYSTERECTOMY     02/15/18    CHOLECYSTECTOMY  2008   LAPAROSCOPIC VAGINAL HYSTERECTOMY WITH SALPINGO OOPHORECTOMY  02/15/2018   PORTACATH PLACEMENT Left 02/25/2018   Procedure: INSERTION PORT-A-CATH;  Surgeon: Earline Mayotte, MD;  Location: [facility] ORS;  Service: General;  Laterality: Left;   TOTAL ABDOMINAL HYSTERECTOMY W/ BILATERAL SALPINGOOPHORECTOMY  02/15/2018   lymph nodes removed    Family History  Problem Relation Age of Onset   Sudden death Mother 78       drowning in hurricane [place]   Pancreatic cancer Father        deceased early 1s; smoker   Alcohol abuse Father    COPD Father    Melanoma Sister        deceased at 56   COPD Sister    Heart disease Brother        Angina   Coronary artery disease Brother    Heart disease Brother        Heart murmur   Prostate cancer Maternal Uncle  deceased 43   Breast cancer Paternal Aunt        unsure of this dx    SOCIAL HX:  reports that she quit smoking about 37 years ago. Her smoking use included cigarettes. She started smoking about 60 years ago. She has a 23 pack-year smoking history. She has never used smokeless tobacco. She reports that she does not drink alcohol and does not use drugs.    Current Outpatient Medications:    cyanocobalamin (VITAMIN B12) 500 MCG tablet, Take 500 mcg by mouth daily., Disp: , Rfl:    Fluticasone-Umeclidin-Vilant (TRELEGY ELLIPTA) 100-62.5-25 MCG/ACT AEPB, Inhale 1 puff into the lungs daily., Disp: , Rfl:    furosemide (LASIX) 20 MG tablet, TAKE 1 TABLET BY MOUTH EVERY DAY, Disp: 90  tablet, Rfl: 2   levalbuterol (XOPENEX HFA) 45 MCG/ACT inhaler, Inhale 2 puffs into the lungs every 6 (six) hours as needed for wheezing or shortness of breath., Disp: 1 each, Rfl: 3   Multiple Vitamins-Minerals (MULTIVITAMIN WITH MINERALS) tablet, Take 1 tablet by mouth daily., Disp: , Rfl:    rosuvastatin (CRESTOR) 5 MG tablet, Take 1 tablet (5 mg total) by mouth daily., Disp: 90 tablet, Rfl: 3   SYNTHROID 150 MCG tablet, TAKE 1 TABLET (150 MCG TOTAL) BY MOUTH DAILY BEFORE BREAKFAST. 30 MIN BEFORE FOOD. YOU CAN TAKE 150 MCG QD ON SUNDAYS 1.5 TABLETS D/C 175 MCG, Disp: 140 tablet, Rfl: 3   traMADol (ULTRAM) 50 MG tablet, Take 1 tablet (50 mg total) by mouth every 8 (eight) hours as needed., Disp: 90 tablet, Rfl: 5   TRELEGY ELLIPTA 100-62.5-25 MCG/ACT AEPB, TAKE 1 PUFF BY MOUTH EVERY DAY, Disp: 60 each, Rfl: 11   Vibegron (GEMTESA) 75 MG TABS, Take 1 tablet (75 mg total) by mouth daily., Disp: 90 tablet, Rfl: 3   Vitamin D, Ergocalciferol, (DRISDOL) 1.25 MG (50000 UNIT) CAPS capsule, Take 1 capsule (50,000 Units total) by mouth once a week. D3, Disp: 12 capsule, Rfl: 1   finasteride (PROSCAR) 5 MG tablet, Take 5 mg by mouth daily., Disp: , Rfl:    pantoprazole (PROTONIX) 40 MG tablet, TAKE 1 TABLET BY MOUTH EVERY DAY (Patient not taking: Reported on 03/15/2023), Disp: 90 tablet, Rfl: 1  EXAM:  VITALS per patient if applicable:  General appearance: alert, cooperative and articulate.  No signs of being in distress  Lungs: not short of breath ,  No cough, speaking in full sentences  Psych: affect normal,dspeech is articulate and non pressured .  Denies suicidal thoughts     ASSESSMENT AND PLAN: Osteoarthritis of right hip, unspecified osteoarthritis type Assessment & Plan: Her pain is secondary to severe degenerative changes of the bilateral hips, right worse than left. Degree of arthropathy has progressed on both hips since 2022. Prominent subchondral cysti changes in the right femoral head.  Refill history confirmed via [place] Controlled Substance databas, accessed by me today.. Continue use of tramadol every 8 hours for ain management until  her arthroplasty occurs    Decreased calculated glomerular filtration rate (GFR) Assessment & Plan: She was advised to suspend celebrex and refrain from using any other NSAIDs . GFR normalized with suspension of NSAIDS.    Lymphedema Assessment & Plan: Has been pumping twice daily        I discussed the assessment and treatment plan with the patient. The patient was provided an opportunity to ask questions and all were answered. The patient agreed with the plan and demonstrated an understanding of the instructions.  The patient was advised to call back or seek an in-person evaluation if the symptoms worsen or if the condition fails to improve as anticipated.   I spent 22  minutes dedicated to the care of this patient on the date of this telephone encounter to include pre-visit review of his medical history,  non  Face-to-face time with the patient , and post visit ordering of testing and therapeutics.    Sherlene Shams, MD

## 2023-03-26 NOTE — Assessment & Plan Note (Addendum)
She was advised to suspend celebrex and refrain from using any other NSAIDs . GFR normalized with suspension of NSAIDS.

## 2023-04-18 DIAGNOSIS — I700 Atherosclerosis of aorta: Secondary | ICD-10-CM | POA: Diagnosis not present

## 2023-04-18 DIAGNOSIS — M160 Bilateral primary osteoarthritis of hip: Secondary | ICD-10-CM | POA: Diagnosis not present

## 2023-04-18 DIAGNOSIS — R9389 Abnormal findings on diagnostic imaging of other specified body structures: Secondary | ICD-10-CM | POA: Diagnosis not present

## 2023-04-18 DIAGNOSIS — M25551 Pain in right hip: Secondary | ICD-10-CM | POA: Diagnosis not present

## 2023-04-18 DIAGNOSIS — T451X5A Adverse effect of antineoplastic and immunosuppressive drugs, initial encounter: Secondary | ICD-10-CM | POA: Diagnosis not present

## 2023-04-18 DIAGNOSIS — G8929 Other chronic pain: Secondary | ICD-10-CM | POA: Diagnosis not present

## 2023-04-18 DIAGNOSIS — G610 Guillain-Barre syndrome: Secondary | ICD-10-CM | POA: Diagnosis not present

## 2023-04-18 DIAGNOSIS — G620 Drug-induced polyneuropathy: Secondary | ICD-10-CM | POA: Diagnosis not present

## 2023-04-25 ENCOUNTER — Other Ambulatory Visit: Payer: Medicare Other

## 2023-04-25 ENCOUNTER — Telehealth: Payer: Self-pay

## 2023-04-25 ENCOUNTER — Ambulatory Visit: Payer: Medicare Other

## 2023-04-25 NOTE — Telephone Encounter (Signed)
Patient called requesting to move  12/4 GYN appointment up to Oct due to scheduled surgery. Please call her to reschedule at [phone]

## 2023-04-26 ENCOUNTER — Telehealth (INDEPENDENT_AMBULATORY_CARE_PROVIDER_SITE_OTHER): Payer: Self-pay

## 2023-04-26 NOTE — Telephone Encounter (Signed)
9:16 am- Patient called stating that she is having hip surgery in November and she needs to be seen because the Surgeon said she needed to get the lymphedema taken care of as soon as possible. Patient has a follow up appt 05/14/23 @ 11 am that she wasn't aware of. She thinks that's not soon enough.   11:27 am- Patient called back stating she wants to know what will be done about the lymphedema when she comes to the appointment, because she has to have everything figured out by 06/13/23 in order to have hip surgery.  I advised that she wear compression and elevate her legs as she was directed at her last appt visit 05/15/22

## 2023-05-02 ENCOUNTER — Ambulatory Visit: Payer: Medicare Other

## 2023-05-02 ENCOUNTER — Other Ambulatory Visit: Payer: Medicare Other

## 2023-05-09 ENCOUNTER — Inpatient Hospital Stay: Payer: Medicare Other

## 2023-05-14 ENCOUNTER — Ambulatory Visit (INDEPENDENT_AMBULATORY_CARE_PROVIDER_SITE_OTHER): Payer: Medicare Other | Admitting: Vascular Surgery

## 2023-05-14 ENCOUNTER — Encounter (INDEPENDENT_AMBULATORY_CARE_PROVIDER_SITE_OTHER): Payer: Self-pay | Admitting: Vascular Surgery

## 2023-05-14 VITALS — BP 125/70 | HR 83 | Resp 19

## 2023-05-14 DIAGNOSIS — I890 Lymphedema, not elsewhere classified: Secondary | ICD-10-CM | POA: Diagnosis not present

## 2023-05-14 DIAGNOSIS — M1611 Unilateral primary osteoarthritis, right hip: Secondary | ICD-10-CM

## 2023-05-14 DIAGNOSIS — E785 Hyperlipidemia, unspecified: Secondary | ICD-10-CM

## 2023-05-14 NOTE — Progress Notes (Signed)
MRN : [id number]  Melissa Anderson is a 82 y.o. (1941/01/24) female who presents with chief complaint of legs swell.  History of Present Illness:   The patient returns to the office for followup evaluation regarding leg swelling.  She is scheduled to have a right total hip replacement in November and is concerned regarding the impact that her lymphedema will have on the surgery.  The swelling has persisted and the pain associated with swelling continues.  However, she notes that the pain is very sharp very severe and seems to be associated with standing and walking and primarily involving the feet themselves.  There have not been any interval development of a ulcerations or wounds.  Since the previous visit the patient has been trying to wear graduated compression stockings and has noted little if any improvement in the lymphedema.  She is also been using her lymph pump for 1 hour twice a day almost every day.  Overall she feels that her lymphedema is about the same.  There have been no interval episodes of cellulitis.   Current Meds  Medication Sig   aspirin EC 81 MG tablet Take 81 mg by mouth daily. Swallow whole.   cyanocobalamin (VITAMIN B12) 500 MCG tablet Take 500 mcg by mouth daily.   finasteride (PROSCAR) 5 MG tablet Take 5 mg by mouth daily.   Fluticasone-Umeclidin-Vilant (TRELEGY ELLIPTA) 100-62.5-25 MCG/ACT AEPB Inhale 1 puff into the lungs daily.   furosemide (LASIX) 20 MG tablet TAKE 1 TABLET BY MOUTH EVERY DAY   pantoprazole (PROTONIX) 40 MG tablet TAKE 1 TABLET BY MOUTH EVERY DAY   rosuvastatin (CRESTOR) 5 MG tablet Take 1 tablet (5 mg total) by mouth daily.   SYNTHROID 150 MCG tablet TAKE 1 TABLET (150 MCG TOTAL) BY MOUTH DAILY BEFORE BREAKFAST. 30 MIN BEFORE FOOD. YOU CAN TAKE 150 MCG QD ON SUNDAYS 1.5 TABLETS D/C 175 MCG   traMADol (ULTRAM) 50 MG tablet Take 1 tablet (50 mg total) by mouth every 8 (eight) hours as needed.   Vibegron (GEMTESA)  75 MG TABS Take 1 tablet (75 mg total) by mouth daily.   Vitamin D, Ergocalciferol, (DRISDOL) 1.25 MG (50000 UNIT) CAPS capsule Take 1 capsule (50,000 Units total) by mouth once a week. 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    04/2021 [facility]   Cancer (HCC) 2019   b/l ovaries and left fallopian tube pelvic lymph nodes negative    Guillain Barr syndrome (HCC)    2/2 flu shot 05/2018,  intubated x 8 days   Hyperlipidemia    Lymphedema    Overactive bladder    Thyroid disease    UTI (urinary tract infection)    Vitamin D deficiency     Past Surgical History:  Procedure Laterality Date   ABDOMINAL HYSTERECTOMY     02/15/18    CHOLECYSTECTOMY  2008   LAPAROSCOPIC VAGINAL HYSTERECTOMY WITH SALPINGO OOPHORECTOMY  02/15/2018   PORTACATH PLACEMENT Left 02/25/2018   Procedure: INSERTION PORT-A-CATH;  Surgeon: Earline Mayotte, MD;  Location: [facility] ORS;  Service: General;  Laterality: Left;   TOTAL ABDOMINAL HYSTERECTOMY W/ BILATERAL SALPINGOOPHORECTOMY  02/15/2018   lymph nodes removed  Social History Social History   Tobacco Use   Smoking status: Former    Current packs/day: 0.00    Average packs/day: 1 pack/day for 23.0 years (23.0 ttl pk-yrs)    Types: Cigarettes    Start date: 1964    Quit date: 55    Years since quitting: 37.7   Smokeless tobacco: Never  Vaping Use   Vaping status: Never Used  Substance Use Topics   Alcohol use: Never   Drug use: Never    Family History Family History  Problem Relation Age of Onset   Sudden death Mother 8       drowning in hurricane Katrina   Pancreatic cancer Father        deceased early 14s; smoker   Alcohol abuse Father    COPD Father    Melanoma Sister        deceased at 24   COPD Sister    Heart disease Brother        Angina   Coronary artery disease Brother    Heart disease Brother        Heart  murmur   Prostate cancer Maternal Uncle        deceased 73   Breast cancer Paternal Aunt        unsure of this dx    Allergies  Allergen Reactions   Novocain [Procaine] Palpitations   Celebrex [Celecoxib] Other (See Comments)    Decreased gfr   Crestor [Rosuvastatin]     Myopathy     Influenza Vaccines     ?GBS 05/2018    Lipitor [Atorvastatin Calcium]     Myalgias       REVIEW OF SYSTEMS (Negative unless checked)  Constitutional: [] Weight loss  [] Fever  [] Chills Cardiac: [] Chest pain   [] Chest pressure   [] Palpitations   [] Shortness of breath when laying flat   [] Shortness of breath with exertion. Vascular:  [] Pain in legs with walking   [x] Pain in legs with standing  [] History of DVT   [] Phlebitis   [x] Swelling in legs   [] Varicose veins   [] Non-healing ulcers Pulmonary:   [] Uses home oxygen   [] Productive cough   [] Hemoptysis   [] Wheeze  [] COPD   [] Asthma Neurologic:  [] Dizziness   [] Seizures   [] History of stroke   [] History of TIA  [] Aphasia   [] Vissual changes   [] Weakness or numbness in arm   [x] Weakness or numbness in leg Musculoskeletal:   [] Joint swelling   [x] Joint pain   [] Low back pain Hematologic:  [] Easy bruising  [] Easy bleeding   [] Hypercoagulable state   [] Anemic Gastrointestinal:  [] Diarrhea   [] Vomiting  [] Gastroesophageal reflux/heartburn   [] Difficulty swallowing. Genitourinary:  [] Chronic kidney disease   [] Difficult urination  [] Frequent urination   [] Blood in urine Skin:  [] Rashes   [] Ulcers  Psychological:  [] History of anxiety   []  History of major depression.  Physical Examination  Vitals:   05/14/23 1014  BP: 125/70  Pulse: 83  Resp: 19   There is no height or weight on file to calculate BMI. Gen: WD/WN, mild distress seen in a wheelchair Head: [place]/AT, No temporalis wasting.  Ear/Nose/Throat: Hearing grossly intact, nares w/o erythema or drainage, pinna without lesions Eyes: PER, EOMI, sclera nonicteric.  Neck: Supple, no gross masses.   No JVD.  Pulmonary:  Good air movement, no audible wheezing, no use of accessory muscles.  Cardiac: RRR, precordium not hyperdynamic. Vascular:  scattered varicosities present bilaterally.  Mild venous stasis changes to the  legs bilaterally.  3+ soft pitting edema, overall her lymphedema seems to be reasonably well-controlled would say that the right is slightly worse than the left CEAP [id number]  Vessel Right Left  Radial Palpable Palpable  Gastrointestinal: soft, non-distended. No guarding/no peritoneal signs.  Musculoskeletal: M/S 5/5 throughout.  No deformity.  Neurologic: CN 2-12 intact. Pain and light touch intact in extremities.  Symmetrical.  Speech is fluent. Motor exam as listed above. Psychiatric: Judgment intact, Mood & affect appropriate for pt's clinical situation. Dermatologic: Venous rashes no ulcers noted.  No changes consistent with cellulitis. Lymph : No lichenification or skin changes of chronic lymphedema.  CBC Lab Results  Component Value Date   WBC 6.6 11/18/2021   HGB 13.3 11/18/2021   HCT 41.2 11/18/2021   MCV 94.3 11/18/2021   PLT 311 11/18/2021    BMET    Component Value Date/Time   NA 139 02/20/2023 0821   K 3.8 02/20/2023 0821   CL 106 02/20/2023 0821   CO2 26 02/20/2023 0821   GLUCOSE 90 02/20/2023 0821   BUN 23 02/20/2023 0821   CREATININE 0.73 02/20/2023 0821   CREATININE 0.77 11/18/2021 1510   CALCIUM 8.8 (L) 02/20/2023 0821   GFRNONAA >60 02/20/2023 0821   GFRAA >60 08/08/2018 0859   CrCl cannot be calculated (Patient's most recent lab result is older than the maximum 21 days allowed.).  COAG No results found for: "INR", "PROTIME"  Radiology No results found.   Assessment/Plan 1. Lymphedema Recommend:  No surgery or intervention at this point in time.    I have reviewed my discussion with the patient regarding lymphedema and why it  causes symptoms.  Patient will continue wearing graduated compression on a daily basis.  We have  discussed at length some possible changes that may be helpful such as compression wraps or compression stockings with zipper.  She will follow-up with [facility] regarding both of these to examine these options.  This is borne out of the fact that she is able to get her stockings on with the [facility] device but is having tremendous difficulty getting them off particularly tried to get them over her heel.  The patient should put the compression on first thing in the morning and removing them in the evening. The patient should not sleep in the compression.   In addition, behavioral modification throughout the day will be continued.  This will include frequent elevation (such as in a recliner), use of over the counter pain medications as needed and exercise such as walking.  The systemic causes for chronic edema such as liver, kidney and cardiac etiologies does not appear to have significant changed over the past year.    The patient will continue aggressive use of the  lymph pump.  This will continue to improve the edema control and prevent sequela such as ulcers and infections.  In addition, given her upcoming surgery I will ask the lymphedema clinic to evaluate her for possible massage even if we can only achieve this for the next 6 to 8 weeks perhaps that would provide any for better foundation prior to her hip replacement  The patient will follow-up with me on an biannual basis.  - Ambulatory referral to Physical Therapy  2. Hyperlipidemia, unspecified hyperlipidemia type Continue antihypertensive medications as already ordered, these medications have been reviewed and there are no changes at this time.  3. Osteoarthritis of right hip, unspecified osteoarthritis type With respect to her lymphedema she is cleared for hip replacement surgery.  I do not see a contraindication at this time.  However, as noted above we will try to optimize her lymphedema with more aggressive compression and potential addition  for lymph massage.  Continue NSAID medications as already ordered, these medications have been reviewed and there are no changes at this time.  Continued activity and therapy was stressed.    Levora Dredge, MD  05/14/2023 11:05 AM

## 2023-06-06 DIAGNOSIS — N3941 Urge incontinence: Secondary | ICD-10-CM | POA: Diagnosis not present

## 2023-06-10 ENCOUNTER — Other Ambulatory Visit: Payer: Self-pay | Admitting: Internal Medicine

## 2023-06-10 DIAGNOSIS — E559 Vitamin D deficiency, unspecified: Secondary | ICD-10-CM

## 2023-06-11 ENCOUNTER — Encounter: Payer: Self-pay | Admitting: Pulmonary Disease

## 2023-06-11 ENCOUNTER — Ambulatory Visit (INDEPENDENT_AMBULATORY_CARE_PROVIDER_SITE_OTHER): Payer: Medicare Other | Admitting: Pulmonary Disease

## 2023-06-11 VITALS — BP 118/78 | HR 87 | Temp 97.6°F | Ht 66.0 in | Wt 187.0 lb

## 2023-06-11 DIAGNOSIS — I5032 Chronic diastolic (congestive) heart failure: Secondary | ICD-10-CM | POA: Diagnosis not present

## 2023-06-11 DIAGNOSIS — K449 Diaphragmatic hernia without obstruction or gangrene: Secondary | ICD-10-CM

## 2023-06-11 DIAGNOSIS — R600 Localized edema: Secondary | ICD-10-CM

## 2023-06-11 DIAGNOSIS — J4489 Other specified chronic obstructive pulmonary disease: Secondary | ICD-10-CM

## 2023-06-11 DIAGNOSIS — M1611 Unilateral primary osteoarthritis, right hip: Secondary | ICD-10-CM | POA: Diagnosis not present

## 2023-06-11 DIAGNOSIS — K219 Gastro-esophageal reflux disease without esophagitis: Secondary | ICD-10-CM | POA: Diagnosis not present

## 2023-06-11 DIAGNOSIS — Z01811 Encounter for preprocedural respiratory examination: Secondary | ICD-10-CM | POA: Diagnosis not present

## 2023-06-11 NOTE — Patient Instructions (Signed)
VISIT SUMMARY:  During our visit, we discussed your chronic hip pain, which has been significantly affecting your activity level and overall quality of life. We also talked about your shortness of breath, leg swelling, persistent cough, and issues with incontinence. You expressed some anxiety about your upcoming hip surgery, and we discussed the importance of good communication between your healthcare providers.  YOUR PLAN:  -HIP OSTEOARTHRITIS: This is a condition where the cushioning cartilage in your hip joint has worn away, causing pain and stiffness. We will continue your current pain management plan until your hip replacement surgery.  -CHRONIC OBSTRUCTIVE PULMONARY DISEASE (COPD)/CHRONIC ASTHMATIC BRONCHITIS: This is a lung disease that makes it hard to breathe. Your shortness of breath has increased due to decreased activity from your hip pain. We will continue your current inhaler medication as it is effective.  -LOWER EXTREMITY EDEMA: This is swelling in your legs, likely due to decreased activity. Your current water pill is not fully effective, so we may adjust the dose after consulting with your heart doctor.  -GASTROESOPHAGEAL REFLUX DISEASE (GERD): This is a condition where stomach acid frequently flows back into your esophagus, causing irritation. You are not currently experiencing symptoms and we will continue your current medication to prevent complications during your surgery.  -URINARY INCONTINENCE: This is a condition where you're unable to control your urine. It's severely affecting your quality of life. We will continue your current management plan.  INSTRUCTIONS:  You have a consultation with your heart doctor on 07/04/2023. After this consultation, we may adjust the dose of your water pill. You also have a hip replacement surgery scheduled for 07/09/2023. Please remember to stop certain medications and vitamins before the surgery as instructed. We will follow up in 4 months  after your surgery.

## 2023-06-11 NOTE — Progress Notes (Signed)
Subjective:    Patient ID: Melissa Anderson, female    DOB: [birth year], 82 y.o.   MRN: [id number]  Patient Care Team: Sherlene Shams, MD as PCP - General (Internal Medicine) End, Cristal Deer, MD as PCP - Cardiology (Cardiology) Artelia Laroche, MD as Referring Physician (Obstetrics and Gynecology) Creig Hines, MD as Consulting Physician (Oncology) Benita Gutter, RN as Oncology Nurse Navigator Salena Saner, MD as Consulting Physician (Pulmonary Disease)  Chief Complaint  Patient presents with   Follow-up    DOE. No wheezing or cough.     HPI BACKGROUND/INTERVAL:Melissa Anderson is an 82 year old former smoker (23 PY) with a history as noted below, who presents for follow-up on the issue of asthmatic bronchitis and shortness of breath.  He has also hiatal hernia with GERD and significant reflux issues due to this.  She was last seen here on 15 March 2023.  She is here for preoperative pulmonary assessment.  Discussed the use of AI scribe software for clinical note transcription with the patient, who gave verbal consent to proceed.  History of Present Illness   The patient, with a history of chronic hip pain, is scheduled for hip surgery on November 11th. She reports that the hip pain has been significantly affecting her activity level and overall quality of life. Despite trying various therapies, the pain persists and has led to a decrease in her physical activity. This decrease in activity has resulted in increased shortness of breath and leg edema. The patient has been seeing a masseuse twice a week to help manage the edema, but it remains a concern, especially in the context of the upcoming surgery.   The patient also reports issues with incontinence, which has been exacerbated by the use of a diuretic for the management of her edema. Despite the inconvenience, she acknowledges the necessity of the diuretic. She also reports a persistent cough, which is triggered by  deep breaths but is otherwise well-controlled.  The patient has been using Trelegy inhaler for her respiratory symptoms, which she reports as being effective. She has not needed to use her emergency albuterol inhaler. She also takes Protonix nightly for a hiatal hernia, which she understands is crucial to manage before her surgery to prevent potential aspiration events.  The patient expresses some anxiety about the upcoming surgery, particularly regarding potential complications and the need for good communication between her various healthcare providers. She is aware of the need to stop certain medications and vitamins before the surgery and is awaiting further instructions.      DATA 04/12/2021 PFTs: Patient had great difficulty performing the test.  FEV1 1.13 L or 52% predicted FVC of 1.94 L or 67% predicted, FEV1/FVC 0.58.  Lung volumes at low end of normal.  Diffusion capacity not valid due to error code. 10/31/2021 echocardiogram: LVEF 60 to 65%, no regional wall motion abnormalities, mild mitral valve regurgitation inferior vena cava dilated suggesting an atrial pressure of 15 mmHg.   Review of Systems A 10 point review of systems was performed and it is as noted above otherwise negative.   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   Aortic atherosclerosis (HCC) 07/14/2020   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   History of ovarian cancer 02/11/2020   Acute pain of right shoulder 08/15/2018    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   Vitamin D deficiency 05/08/2018   Neuropathy 05/08/2018   Overactive bladder 05/08/2018   Bilateral leg edema 05/08/2018   HLD (hyperlipidemia) 05/08/2018   Goals of care, counseling/discussion 02/25/2018   Ovarian cancer (HCC) 02/22/2018   Graves' disease 02/13/2018    Social History   Tobacco Use   Smoking status: Former    Current packs/day: 0.00    Average packs/day: 1 pack/day for 23.0 years (23.0 ttl pk-yrs)    Types: Cigarettes    Start date: 1964    Quit date: 73    Years since quitting: 37.8   Smokeless tobacco: Never  Substance Use Topics   Alcohol use: Never    Allergies  Allergen Reactions   Novocain [Procaine] Palpitations   Celebrex [Celecoxib] Other (See Comments)    Decreased gfr   Crestor [Rosuvastatin]     Myopathy     Influenza Vaccines     ?GBS 05/2018    Lipitor [Atorvastatin Calcium]     Myalgias      Current Meds  Medication Sig   aspirin EC 81 MG tablet Take 81 mg by mouth daily. Swallow whole.   cyanocobalamin (VITAMIN B12) 500 MCG tablet Take 500 mcg by mouth daily.   finasteride (PROSCAR) 5 MG tablet Take 5 mg by mouth daily.   Fluticasone-Umeclidin-Vilant (TRELEGY ELLIPTA) 100-62.5-25 MCG/ACT AEPB Inhale 1 puff into the lungs daily.   furosemide (LASIX) 20 MG tablet TAKE 1 TABLET BY MOUTH EVERY DAY   pantoprazole (PROTONIX) 40 MG tablet TAKE 1 TABLET BY MOUTH EVERY DAY   rosuvastatin (CRESTOR) 5 MG tablet Take 1 tablet (5 mg total) by mouth daily.   SYNTHROID 150 MCG tablet TAKE 1 TABLET (150 MCG TOTAL) BY MOUTH DAILY BEFORE BREAKFAST. 30 MIN BEFORE FOOD. YOU CAN TAKE 150 MCG QD ON SUNDAYS 1.5 TABLETS D/C 175 MCG   traMADol (ULTRAM) 50 MG tablet Take 1 tablet (50 mg total) by mouth every 8 (eight) hours as needed.   Vibegron (GEMTESA) 75 MG TABS Take 1 tablet (75 mg total) by mouth daily.   Vitamin D, Ergocalciferol, (DRISDOL) 1.25 MG (50000 UNIT) CAPS capsule Take 1 capsule (50,000 Units  total) by mouth once a week. D3    Immunization History  Administered Date(s) Administered   Influenza, High Dose Seasonal PF 05/31/2018   Influenza-Unspecified 05/13/2018   PFIZER Comirnaty(Gray Top)Covid-19 Tri-Sucrose Vaccine 11/15/2020   PFIZER(Purple Top)SARS-COV-2 Vaccination 02/09/2020, 03/02/2020      Objective:     BP 118/78 (BP Location: Right Arm, Cuff Size: Normal)   Pulse 87   SpO2 98%   SpO2: 98 % O2 Device: None (Room air)  GENERAL: Well-developed, well-nourished, looks younger than stated age.  Presents in transport chair due to right hip pain.  No conversational dyspnea. HEAD: Normocephalic, atraumatic.  EYES: Pupils equal, round, reactive to light.  No scleral icterus.  MOUTH: Oral mucosa moist.  No thrush. NECK: Supple. No thyromegaly. Trachea midline. No JVD.  No adenopathy. PULMONARY: Good air entry bilaterally.  No adventitious sounds. CARDIOVASCULAR: S1 and S2. Regular rate and rhythm.  No rubs, murmurs or gallops heard. ABDOMEN: Benign. MUSCULOSKELETAL: No joint deformity, no clubbing, 2+ to +3 edema .  Prominent varicose  veins of the lower extremities. NEUROLOGIC: No overt focal deficit, no gait disturbance, speech is fluent. SKIN: Intact,warm,dry. PSYCH: Mood and behavior normal.    Assessment & Plan:     ICD-10-CM   1. Asthmatic bronchitis , chronic (HCC) - moderate  J44.89     2. Chronic diastolic heart failure (HCC)  U72.53     3. Hiatal hernia with GERD  K44.9    K21.9     4. Primary osteoarthritis of right hip  M16.11     5. Bilateral lower extremity edema  R60.0     6. Preoperative respiratory examination  Z01.811      Assessment and Plan    Hip Osteoarthritis Severe pain limiting activity. Hip replacement surgery scheduled for 07/09/2023. -Continue current pain management plan until surgery.  Chronic Obstructive Pulmonary Disease (COPD)/Asthmatic Bronchitis Increased shortness of breath likely due to decreased activity  secondary to hip pain. No recent exacerbations. Trelegy inhaler continues to be effective, no need for albuterol. -Continue Trelegy inhaler as prescribed. -Moderate risk from pulmonary standpoint for proposed procedure. -Risk cannot be further reduced.  Lower Extremity Edema Worsening, likely due to decreased activity. Current diuretic therapy not fully effective. Surgeon has advised to reduce edema prior to hip surgery. -Continue current diuretic therapy. -Consider adjustment of diuretic dose after consultation with cardiologist on 07/04/2023.  Gastroesophageal Reflux Disease (GERD) No current symptoms. Patient is on Protonix. -Continue Protonix to prevent aspiration during anesthesia for upcoming surgery.  Urinary Incontinence Severe, affecting quality of life. No current effective management. -Continue current management plan.  Follow-up in 4 months post-surgery.       Gailen Shelter, MD Advanced Bronchoscopy PCCM [facility]    *This note was dictated using voice recognition software/Dragon.  Despite best efforts to proofread, errors can occur which can change the meaning. Any transcriptional errors that result from this process are unintentional and may not be fully corrected at the time of dictation.

## 2023-06-18 DIAGNOSIS — Z87440 Personal history of urinary (tract) infections: Secondary | ICD-10-CM | POA: Diagnosis not present

## 2023-06-18 DIAGNOSIS — R600 Localized edema: Secondary | ICD-10-CM | POA: Diagnosis not present

## 2023-06-18 DIAGNOSIS — M1611 Unilateral primary osteoarthritis, right hip: Secondary | ICD-10-CM | POA: Diagnosis not present

## 2023-06-18 DIAGNOSIS — I890 Lymphedema, not elsewhere classified: Secondary | ICD-10-CM | POA: Diagnosis not present

## 2023-06-18 DIAGNOSIS — E079 Disorder of thyroid, unspecified: Secondary | ICD-10-CM | POA: Diagnosis not present

## 2023-06-18 DIAGNOSIS — Z8542 Personal history of malignant neoplasm of other parts of uterus: Secondary | ICD-10-CM | POA: Diagnosis not present

## 2023-06-18 DIAGNOSIS — Z87891 Personal history of nicotine dependence: Secondary | ICD-10-CM | POA: Diagnosis not present

## 2023-06-19 ENCOUNTER — Ambulatory Visit: Payer: Medicare Other | Admitting: Internal Medicine

## 2023-06-20 DIAGNOSIS — M6281 Muscle weakness (generalized): Secondary | ICD-10-CM | POA: Diagnosis not present

## 2023-06-20 DIAGNOSIS — I890 Lymphedema, not elsewhere classified: Secondary | ICD-10-CM | POA: Diagnosis not present

## 2023-06-22 DIAGNOSIS — M6281 Muscle weakness (generalized): Secondary | ICD-10-CM | POA: Diagnosis not present

## 2023-06-22 DIAGNOSIS — I890 Lymphedema, not elsewhere classified: Secondary | ICD-10-CM | POA: Diagnosis not present

## 2023-06-22 DIAGNOSIS — L03116 Cellulitis of left lower limb: Secondary | ICD-10-CM | POA: Diagnosis not present

## 2023-06-25 DIAGNOSIS — I890 Lymphedema, not elsewhere classified: Secondary | ICD-10-CM | POA: Diagnosis not present

## 2023-06-25 DIAGNOSIS — E890 Postprocedural hypothyroidism: Secondary | ICD-10-CM | POA: Diagnosis not present

## 2023-06-25 DIAGNOSIS — R29898 Other symptoms and signs involving the musculoskeletal system: Secondary | ICD-10-CM | POA: Diagnosis not present

## 2023-06-25 DIAGNOSIS — Z6831 Body mass index (BMI) 31.0-31.9, adult: Secondary | ICD-10-CM | POA: Diagnosis not present

## 2023-06-25 DIAGNOSIS — E7849 Other hyperlipidemia: Secondary | ICD-10-CM | POA: Diagnosis not present

## 2023-06-25 DIAGNOSIS — M1611 Unilateral primary osteoarthritis, right hip: Secondary | ICD-10-CM | POA: Diagnosis not present

## 2023-06-25 DIAGNOSIS — M6281 Muscle weakness (generalized): Secondary | ICD-10-CM | POA: Diagnosis not present

## 2023-06-25 DIAGNOSIS — J45909 Unspecified asthma, uncomplicated: Secondary | ICD-10-CM | POA: Diagnosis not present

## 2023-06-25 DIAGNOSIS — R251 Tremor, unspecified: Secondary | ICD-10-CM | POA: Diagnosis not present

## 2023-06-25 DIAGNOSIS — E038 Other specified hypothyroidism: Secondary | ICD-10-CM | POA: Diagnosis not present

## 2023-06-25 DIAGNOSIS — M160 Bilateral primary osteoarthritis of hip: Secondary | ICD-10-CM | POA: Diagnosis not present

## 2023-06-25 DIAGNOSIS — E66811 Obesity, class 1: Secondary | ICD-10-CM | POA: Diagnosis not present

## 2023-06-25 DIAGNOSIS — Z01818 Encounter for other preprocedural examination: Secondary | ICD-10-CM | POA: Diagnosis not present

## 2023-06-25 DIAGNOSIS — R202 Paresthesia of skin: Secondary | ICD-10-CM | POA: Diagnosis not present

## 2023-06-25 DIAGNOSIS — R200 Anesthesia of skin: Secondary | ICD-10-CM | POA: Diagnosis not present

## 2023-06-25 DIAGNOSIS — I700 Atherosclerosis of aorta: Secondary | ICD-10-CM | POA: Diagnosis not present

## 2023-06-25 DIAGNOSIS — E0500 Thyrotoxicosis with diffuse goiter without thyrotoxic crisis or storm: Secondary | ICD-10-CM | POA: Diagnosis not present

## 2023-06-25 DIAGNOSIS — I5032 Chronic diastolic (congestive) heart failure: Secondary | ICD-10-CM | POA: Diagnosis not present

## 2023-06-25 DIAGNOSIS — N3941 Urge incontinence: Secondary | ICD-10-CM | POA: Diagnosis not present

## 2023-06-25 DIAGNOSIS — K219 Gastro-esophageal reflux disease without esophagitis: Secondary | ICD-10-CM | POA: Diagnosis not present

## 2023-06-25 DIAGNOSIS — C569 Malignant neoplasm of unspecified ovary: Secondary | ICD-10-CM | POA: Diagnosis not present

## 2023-06-25 DIAGNOSIS — E559 Vitamin D deficiency, unspecified: Secondary | ICD-10-CM | POA: Diagnosis not present

## 2023-06-25 DIAGNOSIS — G620 Drug-induced polyneuropathy: Secondary | ICD-10-CM | POA: Diagnosis not present

## 2023-06-25 DIAGNOSIS — G610 Guillain-Barre syndrome: Secondary | ICD-10-CM | POA: Diagnosis not present

## 2023-06-25 DIAGNOSIS — R0609 Other forms of dyspnea: Secondary | ICD-10-CM | POA: Diagnosis not present

## 2023-06-25 DIAGNOSIS — G8929 Other chronic pain: Secondary | ICD-10-CM | POA: Diagnosis not present

## 2023-06-25 DIAGNOSIS — M25551 Pain in right hip: Secondary | ICD-10-CM | POA: Diagnosis not present

## 2023-06-27 ENCOUNTER — Inpatient Hospital Stay: Payer: Medicare Other

## 2023-06-29 DIAGNOSIS — I890 Lymphedema, not elsewhere classified: Secondary | ICD-10-CM | POA: Diagnosis not present

## 2023-06-29 DIAGNOSIS — M6281 Muscle weakness (generalized): Secondary | ICD-10-CM | POA: Diagnosis not present

## 2023-07-04 ENCOUNTER — Ambulatory Visit: Payer: Medicare Other | Admitting: Internal Medicine

## 2023-07-04 DIAGNOSIS — M6281 Muscle weakness (generalized): Secondary | ICD-10-CM | POA: Diagnosis not present

## 2023-07-04 DIAGNOSIS — I890 Lymphedema, not elsewhere classified: Secondary | ICD-10-CM | POA: Diagnosis not present

## 2023-07-09 DIAGNOSIS — Z9049 Acquired absence of other specified parts of digestive tract: Secondary | ICD-10-CM | POA: Diagnosis not present

## 2023-07-09 DIAGNOSIS — M25551 Pain in right hip: Secondary | ICD-10-CM | POA: Diagnosis not present

## 2023-07-09 DIAGNOSIS — Z7982 Long term (current) use of aspirin: Secondary | ICD-10-CM | POA: Diagnosis not present

## 2023-07-09 DIAGNOSIS — E559 Vitamin D deficiency, unspecified: Secondary | ICD-10-CM | POA: Diagnosis not present

## 2023-07-09 DIAGNOSIS — I5032 Chronic diastolic (congestive) heart failure: Secondary | ICD-10-CM | POA: Diagnosis not present

## 2023-07-09 DIAGNOSIS — Z87891 Personal history of nicotine dependence: Secondary | ICD-10-CM | POA: Diagnosis not present

## 2023-07-09 DIAGNOSIS — M24151 Other articular cartilage disorders, right hip: Secondary | ICD-10-CM | POA: Diagnosis not present

## 2023-07-09 DIAGNOSIS — I700 Atherosclerosis of aorta: Secondary | ICD-10-CM | POA: Diagnosis not present

## 2023-07-09 DIAGNOSIS — Z85828 Personal history of other malignant neoplasm of skin: Secondary | ICD-10-CM | POA: Diagnosis not present

## 2023-07-09 DIAGNOSIS — E66811 Obesity, class 1: Secondary | ICD-10-CM | POA: Diagnosis not present

## 2023-07-09 DIAGNOSIS — K219 Gastro-esophageal reflux disease without esophagitis: Secondary | ICD-10-CM | POA: Diagnosis not present

## 2023-07-09 DIAGNOSIS — M1611 Unilateral primary osteoarthritis, right hip: Secondary | ICD-10-CM | POA: Diagnosis not present

## 2023-07-09 DIAGNOSIS — M160 Bilateral primary osteoarthritis of hip: Secondary | ICD-10-CM | POA: Diagnosis not present

## 2023-07-09 DIAGNOSIS — Z8543 Personal history of malignant neoplasm of ovary: Secondary | ICD-10-CM | POA: Diagnosis not present

## 2023-07-09 DIAGNOSIS — N3281 Overactive bladder: Secondary | ICD-10-CM | POA: Diagnosis not present

## 2023-07-09 DIAGNOSIS — N3941 Urge incontinence: Secondary | ICD-10-CM | POA: Diagnosis not present

## 2023-07-09 DIAGNOSIS — Z888 Allergy status to other drugs, medicaments and biological substances status: Secondary | ICD-10-CM | POA: Diagnosis not present

## 2023-07-09 DIAGNOSIS — E785 Hyperlipidemia, unspecified: Secondary | ICD-10-CM | POA: Diagnosis not present

## 2023-07-09 DIAGNOSIS — M25751 Osteophyte, right hip: Secondary | ICD-10-CM | POA: Diagnosis not present

## 2023-07-09 DIAGNOSIS — E669 Obesity, unspecified: Secondary | ICD-10-CM | POA: Diagnosis not present

## 2023-07-09 DIAGNOSIS — G620 Drug-induced polyneuropathy: Secondary | ICD-10-CM | POA: Diagnosis not present

## 2023-07-09 DIAGNOSIS — T451X5A Adverse effect of antineoplastic and immunosuppressive drugs, initial encounter: Secondary | ICD-10-CM | POA: Diagnosis not present

## 2023-07-09 DIAGNOSIS — R262 Difficulty in walking, not elsewhere classified: Secondary | ICD-10-CM | POA: Diagnosis not present

## 2023-07-09 DIAGNOSIS — G8918 Other acute postprocedural pain: Secondary | ICD-10-CM | POA: Diagnosis not present

## 2023-07-09 DIAGNOSIS — T451X5D Adverse effect of antineoplastic and immunosuppressive drugs, subsequent encounter: Secondary | ICD-10-CM | POA: Diagnosis not present

## 2023-07-09 DIAGNOSIS — Z471 Aftercare following joint replacement surgery: Secondary | ICD-10-CM | POA: Diagnosis not present

## 2023-07-09 DIAGNOSIS — Z96641 Presence of right artificial hip joint: Secondary | ICD-10-CM | POA: Diagnosis not present

## 2023-07-09 DIAGNOSIS — Z7951 Long term (current) use of inhaled steroids: Secondary | ICD-10-CM | POA: Diagnosis not present

## 2023-07-09 DIAGNOSIS — Z1152 Encounter for screening for COVID-19: Secondary | ICD-10-CM | POA: Diagnosis not present

## 2023-07-09 DIAGNOSIS — Z743 Need for continuous supervision: Secondary | ICD-10-CM | POA: Diagnosis not present

## 2023-07-09 DIAGNOSIS — I10 Essential (primary) hypertension: Secondary | ICD-10-CM | POA: Diagnosis not present

## 2023-07-09 DIAGNOSIS — Z6831 Body mass index (BMI) 31.0-31.9, adult: Secondary | ICD-10-CM | POA: Diagnosis not present

## 2023-07-09 DIAGNOSIS — J45909 Unspecified asthma, uncomplicated: Secondary | ICD-10-CM | POA: Diagnosis not present

## 2023-07-09 DIAGNOSIS — E039 Hypothyroidism, unspecified: Secondary | ICD-10-CM | POA: Diagnosis not present

## 2023-07-09 DIAGNOSIS — Z90710 Acquired absence of both cervix and uterus: Secondary | ICD-10-CM | POA: Diagnosis not present

## 2023-07-09 DIAGNOSIS — E890 Postprocedural hypothyroidism: Secondary | ICD-10-CM | POA: Diagnosis not present

## 2023-07-09 DIAGNOSIS — G610 Guillain-Barre syndrome: Secondary | ICD-10-CM | POA: Diagnosis not present

## 2023-07-09 DIAGNOSIS — R251 Tremor, unspecified: Secondary | ICD-10-CM | POA: Diagnosis not present

## 2023-07-09 DIAGNOSIS — M6281 Muscle weakness (generalized): Secondary | ICD-10-CM | POA: Diagnosis not present

## 2023-07-09 DIAGNOSIS — I890 Lymphedema, not elsewhere classified: Secondary | ICD-10-CM | POA: Diagnosis not present

## 2023-07-09 DIAGNOSIS — Z887 Allergy status to serum and vaccine status: Secondary | ICD-10-CM | POA: Diagnosis not present

## 2023-07-09 DIAGNOSIS — G8929 Other chronic pain: Secondary | ICD-10-CM | POA: Diagnosis not present

## 2023-07-12 DIAGNOSIS — M25551 Pain in right hip: Secondary | ICD-10-CM | POA: Diagnosis not present

## 2023-07-12 DIAGNOSIS — R609 Edema, unspecified: Secondary | ICD-10-CM | POA: Diagnosis not present

## 2023-07-12 DIAGNOSIS — I10 Essential (primary) hypertension: Secondary | ICD-10-CM | POA: Diagnosis not present

## 2023-07-12 DIAGNOSIS — R251 Tremor, unspecified: Secondary | ICD-10-CM | POA: Diagnosis not present

## 2023-07-12 DIAGNOSIS — I5032 Chronic diastolic (congestive) heart failure: Secondary | ICD-10-CM | POA: Diagnosis not present

## 2023-07-12 DIAGNOSIS — N3941 Urge incontinence: Secondary | ICD-10-CM | POA: Diagnosis not present

## 2023-07-12 DIAGNOSIS — G610 Guillain-Barre syndrome: Secondary | ICD-10-CM | POA: Diagnosis not present

## 2023-07-12 DIAGNOSIS — K219 Gastro-esophageal reflux disease without esophagitis: Secondary | ICD-10-CM | POA: Diagnosis not present

## 2023-07-12 DIAGNOSIS — Z96641 Presence of right artificial hip joint: Secondary | ICD-10-CM | POA: Diagnosis not present

## 2023-07-12 DIAGNOSIS — R262 Difficulty in walking, not elsewhere classified: Secondary | ICD-10-CM | POA: Diagnosis not present

## 2023-07-12 DIAGNOSIS — G620 Drug-induced polyneuropathy: Secondary | ICD-10-CM | POA: Diagnosis not present

## 2023-07-12 DIAGNOSIS — E559 Vitamin D deficiency, unspecified: Secondary | ICD-10-CM | POA: Diagnosis not present

## 2023-07-12 DIAGNOSIS — T451X5D Adverse effect of antineoplastic and immunosuppressive drugs, subsequent encounter: Secondary | ICD-10-CM | POA: Diagnosis not present

## 2023-07-12 DIAGNOSIS — E66811 Obesity, class 1: Secondary | ICD-10-CM | POA: Diagnosis not present

## 2023-07-12 DIAGNOSIS — Z743 Need for continuous supervision: Secondary | ICD-10-CM | POA: Diagnosis not present

## 2023-07-12 DIAGNOSIS — E039 Hypothyroidism, unspecified: Secondary | ICD-10-CM | POA: Diagnosis not present

## 2023-07-12 DIAGNOSIS — N3281 Overactive bladder: Secondary | ICD-10-CM | POA: Diagnosis not present

## 2023-07-12 DIAGNOSIS — G8918 Other acute postprocedural pain: Secondary | ICD-10-CM | POA: Diagnosis not present

## 2023-07-12 DIAGNOSIS — E785 Hyperlipidemia, unspecified: Secondary | ICD-10-CM | POA: Diagnosis not present

## 2023-07-12 DIAGNOSIS — Z8543 Personal history of malignant neoplasm of ovary: Secondary | ICD-10-CM | POA: Diagnosis not present

## 2023-07-12 DIAGNOSIS — M6281 Muscle weakness (generalized): Secondary | ICD-10-CM | POA: Diagnosis not present

## 2023-07-12 DIAGNOSIS — J45909 Unspecified asthma, uncomplicated: Secondary | ICD-10-CM | POA: Diagnosis not present

## 2023-07-12 DIAGNOSIS — M1611 Unilateral primary osteoarthritis, right hip: Secondary | ICD-10-CM | POA: Diagnosis not present

## 2023-07-12 DIAGNOSIS — Z471 Aftercare following joint replacement surgery: Secondary | ICD-10-CM | POA: Diagnosis not present

## 2023-07-23 DIAGNOSIS — R609 Edema, unspecified: Secondary | ICD-10-CM | POA: Diagnosis not present

## 2023-07-24 DIAGNOSIS — Z96641 Presence of right artificial hip joint: Secondary | ICD-10-CM | POA: Diagnosis not present

## 2023-07-24 DIAGNOSIS — Z471 Aftercare following joint replacement surgery: Secondary | ICD-10-CM | POA: Diagnosis not present

## 2023-07-26 ENCOUNTER — Other Ambulatory Visit: Payer: Self-pay | Admitting: Internal Medicine

## 2023-07-30 NOTE — Telephone Encounter (Signed)
Good morning,  Will you please outreach patient to schedule past due 6 month follow up with Dr. Okey Dupre.  Thank you,  Ferne Coe

## 2023-08-01 ENCOUNTER — Other Ambulatory Visit: Payer: Medicare Other

## 2023-08-01 ENCOUNTER — Ambulatory Visit: Payer: Medicare Other

## 2023-08-01 ENCOUNTER — Ambulatory Visit: Payer: Medicare Other | Admitting: Internal Medicine

## 2023-08-01 DIAGNOSIS — E559 Vitamin D deficiency, unspecified: Secondary | ICD-10-CM | POA: Diagnosis not present

## 2023-08-01 DIAGNOSIS — M109 Gout, unspecified: Secondary | ICD-10-CM | POA: Diagnosis not present

## 2023-08-01 DIAGNOSIS — Z7982 Long term (current) use of aspirin: Secondary | ICD-10-CM | POA: Diagnosis not present

## 2023-08-01 DIAGNOSIS — Z471 Aftercare following joint replacement surgery: Secondary | ICD-10-CM | POA: Diagnosis not present

## 2023-08-01 DIAGNOSIS — N3941 Urge incontinence: Secondary | ICD-10-CM | POA: Diagnosis not present

## 2023-08-01 DIAGNOSIS — I5032 Chronic diastolic (congestive) heart failure: Secondary | ICD-10-CM | POA: Diagnosis not present

## 2023-08-01 DIAGNOSIS — N3281 Overactive bladder: Secondary | ICD-10-CM | POA: Diagnosis not present

## 2023-08-01 DIAGNOSIS — K219 Gastro-esophageal reflux disease without esophagitis: Secondary | ICD-10-CM | POA: Diagnosis not present

## 2023-08-01 DIAGNOSIS — Z9181 History of falling: Secondary | ICD-10-CM | POA: Diagnosis not present

## 2023-08-01 DIAGNOSIS — J45909 Unspecified asthma, uncomplicated: Secondary | ICD-10-CM | POA: Diagnosis not present

## 2023-08-01 DIAGNOSIS — G610 Guillain-Barre syndrome: Secondary | ICD-10-CM | POA: Diagnosis not present

## 2023-08-01 DIAGNOSIS — Z96641 Presence of right artificial hip joint: Secondary | ICD-10-CM | POA: Diagnosis not present

## 2023-08-03 DIAGNOSIS — Z471 Aftercare following joint replacement surgery: Secondary | ICD-10-CM | POA: Diagnosis not present

## 2023-08-03 DIAGNOSIS — M109 Gout, unspecified: Secondary | ICD-10-CM | POA: Diagnosis not present

## 2023-08-03 DIAGNOSIS — J45909 Unspecified asthma, uncomplicated: Secondary | ICD-10-CM | POA: Diagnosis not present

## 2023-08-03 DIAGNOSIS — G610 Guillain-Barre syndrome: Secondary | ICD-10-CM | POA: Diagnosis not present

## 2023-08-03 DIAGNOSIS — Z96641 Presence of right artificial hip joint: Secondary | ICD-10-CM | POA: Diagnosis not present

## 2023-08-03 DIAGNOSIS — I5032 Chronic diastolic (congestive) heart failure: Secondary | ICD-10-CM | POA: Diagnosis not present

## 2023-08-06 ENCOUNTER — Ambulatory Visit: Payer: Medicare Other

## 2023-08-06 ENCOUNTER — Telehealth: Payer: Self-pay

## 2023-08-06 ENCOUNTER — Encounter: Payer: Self-pay | Admitting: Internal Medicine

## 2023-08-06 ENCOUNTER — Ambulatory Visit (INDEPENDENT_AMBULATORY_CARE_PROVIDER_SITE_OTHER): Payer: Medicare Other | Admitting: Internal Medicine

## 2023-08-06 VITALS — BP 118/60 | HR 82 | Ht 66.0 in | Wt 194.8 lb

## 2023-08-06 DIAGNOSIS — M1611 Unilateral primary osteoarthritis, right hip: Secondary | ICD-10-CM | POA: Diagnosis not present

## 2023-08-06 DIAGNOSIS — Z09 Encounter for follow-up examination after completed treatment for conditions other than malignant neoplasm: Secondary | ICD-10-CM | POA: Diagnosis not present

## 2023-08-06 DIAGNOSIS — D62 Acute posthemorrhagic anemia: Secondary | ICD-10-CM | POA: Insufficient documentation

## 2023-08-06 DIAGNOSIS — I890 Lymphedema, not elsewhere classified: Secondary | ICD-10-CM | POA: Diagnosis not present

## 2023-08-06 DIAGNOSIS — R600 Localized edema: Secondary | ICD-10-CM

## 2023-08-06 DIAGNOSIS — R944 Abnormal results of kidney function studies: Secondary | ICD-10-CM

## 2023-08-06 LAB — CBC WITH DIFFERENTIAL/PLATELET
Basophils Absolute: 0.1 10*3/uL (ref 0.0–0.1)
Basophils Relative: 0.9 % (ref 0.0–3.0)
Eosinophils Absolute: 0.4 10*3/uL (ref 0.0–0.7)
Eosinophils Relative: 4.7 % (ref 0.0–5.0)
HCT: 37.2 % (ref 36.0–46.0)
Hemoglobin: 12.1 g/dL (ref 12.0–15.0)
Lymphocytes Relative: 14.8 % (ref 12.0–46.0)
Lymphs Abs: 1.2 10*3/uL (ref 0.7–4.0)
MCHC: 32.6 g/dL (ref 30.0–36.0)
MCV: 96.9 fL (ref 78.0–100.0)
Monocytes Absolute: 0.8 10*3/uL (ref 0.1–1.0)
Monocytes Relative: 10.6 % (ref 3.0–12.0)
Neutro Abs: 5.4 10*3/uL (ref 1.4–7.7)
Neutrophils Relative %: 69 % (ref 43.0–77.0)
Platelets: 335 10*3/uL (ref 150.0–400.0)
RBC: 3.84 Mil/uL — ABNORMAL LOW (ref 3.87–5.11)
RDW: 16.2 % — ABNORMAL HIGH (ref 11.5–15.5)
WBC: 7.8 10*3/uL (ref 4.0–10.5)

## 2023-08-06 LAB — BASIC METABOLIC PANEL
BUN: 29 mg/dL — ABNORMAL HIGH (ref 6–23)
CO2: 27 meq/L (ref 19–32)
Calcium: 9.2 mg/dL (ref 8.4–10.5)
Chloride: 105 meq/L (ref 96–112)
Creatinine, Ser: 0.88 mg/dL (ref 0.40–1.20)
GFR: 60.87 mL/min (ref >60.00)
Glucose, Bld: 83 mg/dL (ref 70–99)
Potassium: 4.4 meq/L (ref 3.5–5.1)
Sodium: 140 meq/L (ref 135–145)

## 2023-08-06 NOTE — Assessment & Plan Note (Addendum)
A Drop of one point was noted.  She his having restless legs symptoms at hight. Since surgery.   .  Rechecking today along with iron

## 2023-08-06 NOTE — Progress Notes (Signed)
Subjective:  Patient ID: Melissa Anderson, female    DOB: 05/26/1941  Age: 82 y.o. MRN: [id number]  CC: The primary encounter diagnosis was Osteoarthritis of right hip, unspecified osteoarthritis type. Diagnoses of Postoperative anemia due to acute blood loss, Decreased calculated glomerular filtration rate (GFR), Bilateral leg edema, Lymphedema, and Hospital discharge follow-up were also pertinent to this visit.   HPI Melissa Anderson presents for  Chief Complaint  Patient presents with   Hospitalization Follow-up    Ms Daye is an 82 yr old female with a history of lymphedema and Right hip DJD who was a same-day admit TO [facility] on 07/09/2023 for orthopedic syrgery by [facility], TOTAL RIGHT HIP ARTHROPLASTY . The patient had spinal and regional anesthesia with an estimated blood loss of . There were no apparent intraoperative complications. The patient did receive perioperative antibiotics and DVT/PE prophylaxis. The patient had no foley and the patient was able to void. She was discharged to [facility] SNF for 2.5 WEEKS OF rehab and seen in follow up by Orhtopedics  on nov 26 with repeat films done.  Notes reviewed:   "11/26: Pain management is good. Patient was transported from the [facility] SNF via wheelchair and reports that she is able to ambulate well with a walker. She is currently receiving PT and OT daily and the SNF. Patient is currently taking Tylenol 1000 mg TID, Asprin 81 mg BID, Mobic 7.5 mg, and Oxycodone 5 mg PRN.  On her PE it was noted that "the wound is well healed; no erythema , drainage or distal swelling. Calf is soft and non-tender. ROM is not tested. Staples/sutures are removed today. "  She Was sent home on aspirin .  Has been taking furosemide since  discharge prescro\ibed by Dr. Okey Dupre but has not had lymphedema managed since preop.  She is currently not using oxycodone or tramadol.    HAS BEEN HOME FOR ONE WEEK . HOME HEALTH HAS BEEN OUT ONCE SINCE  DISCHARGE FOR AN INTAKE INTERVIEWED.  [facility]  PT AND OT HAVE NOT BEEN OUT YET.  DAUGHTER AND GRANDDAUGHTER ASSISTED WITH SHOWER SUPERVISION  RECENTLY.   She has had increased leg eema bilaterally  right >> left.  With \\She    has not been pumping since surgery  because she has not been authorized to do so. Preoperatively she was driving to [facility] every other day for  compressive leg wraps that were done  unilaterally (with patient applying a velcro compressin wrap to the other leg ), [place] HAS NOT HAD ANY SINCE THEN  because she can't reach the ankle due to the 45 degree flexion she has been advised to avoid .    Outpatient Medications Prior to Visit  Medication Sig Dispense Refill   aspirin EC 81 MG tablet Take 81 mg by mouth daily. Swallow whole.     cyanocobalamin (VITAMIN B12) 500 MCG tablet Take 500 mcg by mouth daily.     finasteride (PROSCAR) 5 MG tablet Take 5 mg by mouth daily.     Fluticasone-Umeclidin-Vilant (TRELEGY ELLIPTA) 100-62.5-25 MCG/ACT AEPB Inhale 1 puff into the lungs daily.     furosemide (LASIX) 20 MG tablet Take 1 tablet (20 mg total) by mouth daily. PLEASE KEEP UPCOMING  APPT SCHEDULED 09/2023 FOR FURTHER REFILLS. (FIRST ATTEMPT) 90 tablet 0   pantoprazole (PROTONIX) 40 MG tablet TAKE 1 TABLET BY MOUTH EVERY DAY 90 tablet 1   rosuvastatin (CRESTOR) 5 MG tablet Take 1 tablet (5 mg total) by mouth  daily. 90 tablet 3   SYNTHROID 150 MCG tablet TAKE 1 TABLET (150 MCG TOTAL) BY MOUTH DAILY BEFORE BREAKFAST. 30 MIN BEFORE FOOD. YOU CAN TAKE 150 MCG QD ON SUNDAYS 1.5 TABLETS D/C 175 MCG 140 tablet 3   Vibegron (GEMTESA) 75 MG TABS Take 1 tablet (75 mg total) by mouth daily. 90 tablet 3   Vitamin D, Ergocalciferol, (DRISDOL) 1.25 MG (50000 UNIT) CAPS capsule TAKE 1 CAPSULE (50,000 UNITS TOTAL) BY MOUTH ONCE A WEEK. D3 12 capsule 1   oxyCODONE (OXY IR/ROXICODONE) 5 MG immediate release tablet Take 5 mg by mouth every 4 (four) hours as needed. (Patient not taking: Reported on 08/06/2023)      traMADol (ULTRAM) 50 MG tablet Take 1 tablet (50 mg total) by mouth every 8 (eight) hours as needed. (Patient not taking: Reported on 08/06/2023) 90 tablet 5   No facility-administered medications prior to visit.    Review of Systems;  Patient denies headache, fevers, malaise, unintentional weight loss, skin rash, eye pain, sinus congestion and sinus pain, sore throat, dysphagia,  hemoptysis , cough, dyspnea, wheezing, chest pain, palpitations, orthopnea, edema, abdominal pain, nausea, melena, diarrhea, constipation, flank pain, dysuria, hematuria, urinary  Frequency, nocturia, numbness, tingling, seizures,  Focal weakness, Loss of consciousness,  Tremor, insomnia, depression, anxiety, and suicidal ideation.      Objective:  BP 118/60   Pulse 82   Ht 5\' 6"  (1.676 m)   Wt 194 lb 12.8 oz (88.4 kg)   SpO2 98%   BMI 31.44 kg/m   BP Readings from Last 3 Encounters:  08/06/23 118/60  06/11/23 118/78  05/14/23 125/70    Wt Readings from Last 3 Encounters:  08/06/23 194 lb 12.8 oz (88.4 kg)  06/11/23 187 lb (84.8 kg)  03/26/23 186 lb (84.4 kg)    Physical Exam Vitals reviewed.  Constitutional:      General: She is not in acute distress.    Appearance: Normal appearance. She is normal weight. She is not ill-appearing, toxic-appearing or diaphoretic.  HENT:     Head: Normocephalic.  Eyes:     General: No scleral icterus.       Right eye: No discharge.        Left eye: No discharge.     Conjunctiva/sclera: Conjunctivae normal.  Cardiovascular:     Rate and Rhythm: Normal rate and regular rhythm.     Heart sounds: Normal heart sounds.  Pulmonary:     Effort: Pulmonary effort is normal. No respiratory distress.     Breath sounds: Normal breath sounds.  Musculoskeletal:        General: Swelling present. Normal range of motion.     Right lower leg: Edema present.     Left lower leg: Edema present.  Skin:    General: Skin is warm and dry.     Findings: Erythema present.        Neurological:     General: No focal deficit present.     Mental Status: She is alert and oriented to person, place, and time. Mental status is at baseline.  Psychiatric:        Mood and Affect: Mood normal.        Behavior: Behavior normal.        Thought Content: Thought content normal.        Judgment: Judgment normal.    Lab Results  Component Value Date   HGBA1C 5.3 05/09/2018    Lab Results  Component Value Date   CREATININE  0.73 02/20/2023   CREATININE 0.83 02/02/2023   CREATININE 0.94 01/03/2023    Lab Results  Component Value Date   WBC 6.6 11/18/2021   HGB 13.3 11/18/2021   HCT 41.2 11/18/2021   PLT 311 11/18/2021   GLUCOSE 90 02/20/2023   CHOL 151 02/20/2023   TRIG 43 02/20/2023   HDL 67 02/20/2023   LDLDIRECT 124.0 12/14/2022   LDLCALC 75 02/20/2023   ALT 15 02/20/2023   AST 19 02/20/2023   NA 139 02/20/2023   K 3.8 02/20/2023   CL 106 02/20/2023   CREATININE 0.73 02/20/2023   BUN 23 02/20/2023   CO2 26 02/20/2023   TSH 2.63 12/14/2022   HGBA1C 5.3 05/09/2018    No results found.  Assessment & Plan:  .Osteoarthritis of right hip, unspecified osteoarthritis type Assessment & Plan: S.p [facility] Nov 11  with good results and relief of pain requiring narcotics fo r analgesia.  Using a walker . Estimated Blood loss of 300 ml per op report,     Postoperative anemia due to acute blood loss Assessment & Plan:  A Drop of one point was noted.  She his having restless legs symptoms at hight. Since surgery.   .  Rechecking today along with iron   Orders: -     Iron, TIBC and Ferritin Panel -     CBC with Differential/Platelet  Decreased calculated glomerular filtration rate (GFR) -     Basic metabolic panel  Bilateral leg edema -     US Venous Img Lower Bilateral (DVT); Future -     D-dimer, quantitative  Lymphedema Assessment & Plan: She is at increased risk for DVT due ot recent orthopeidc surgery.  Need to rule out DVT prior to resuming  compression.  Stat bilateral   dopplers ultrasounds of LE's  for DVT rule out    Hospital discharge follow-up Assessment & Plan: ADMITTED TO [facility] NOV 11 FOR RIGHT THR,  SENT TO SNF FOR REHAB AND DISCHARGED HOME ONE WEEK AGO  Patient is stable post discharge and has  new issues and questions about her discharge plans,  which wwere addressed today       I provided 30 minutes of face-to-face time during this encounter reviewing patient's last visit with me, patient's  most recent visit with cardiology,  nephrology,  and neurology,  recent surgical and non surgical procedures, previous  labs and imaging studies, counseling on currently addressed issues,  and post visit ordering to diagnostics and therapeutics .   Follow-up: No follow-ups on file.   Sherlene Shams, MD

## 2023-08-06 NOTE — Patient Instructions (Addendum)
You need more protein but not more calories.  :  I recommend that  you  try a premixed protein drink called Premier Protein shake for breakfast or late night snack . It is great tasting,   very low sugar and available of < $2 serving at [facility] and  In bulk for $1.50/serving at [employer] and [employer]  .    Nutritional analysis :  160 cal  30 g protein  1 g sugar 50% calcium needs   [employer] and [employer]   LET us KNOW  WHAT HOME HEALTH AGENCY YOU ARE ENROLLED WITH SO WE CAN START SOMEBODY WRAPPING YOUR LEGS   ULTRASOUNDS OF BOTH LEGS ORDERED TO RULE OUT DVT

## 2023-08-06 NOTE — Telephone Encounter (Signed)
Patient states Dr. Duncan Dull asked her to please call and let us know the name of the home health agency who is not doing what they are supposed to do.  Patient states the name of the home health agency is [facility] and their phone number is [phone] or [phone].

## 2023-08-06 NOTE — Assessment & Plan Note (Signed)
ADMITTED TO [facility] NOV 11 FOR RIGHT THR,  SENT TO SNF FOR REHAB AND DISCHARGED HOME ONE WEEK AGO  Patient is stable post discharge and has  new issues and questions about her discharge plans,  which wwere addressed today

## 2023-08-06 NOTE — Assessment & Plan Note (Signed)
She is at increased risk for DVT due ot recent orthopeidc surgery.  Need to rule out DVT prior to resuming compression.  Stat bilateral   dopplers ultrasounds of LE's  for DVT rule out

## 2023-08-06 NOTE — Assessment & Plan Note (Signed)
S.p [facility]R Nov 11  with good results and relief of pain requiring narcotics fo r analgesia.  Using a walker . Estimated Blood loss of 300 ml per op report,

## 2023-08-07 DIAGNOSIS — G610 Guillain-Barre syndrome: Secondary | ICD-10-CM | POA: Diagnosis not present

## 2023-08-07 DIAGNOSIS — J45909 Unspecified asthma, uncomplicated: Secondary | ICD-10-CM | POA: Diagnosis not present

## 2023-08-07 DIAGNOSIS — M109 Gout, unspecified: Secondary | ICD-10-CM | POA: Diagnosis not present

## 2023-08-07 DIAGNOSIS — Z471 Aftercare following joint replacement surgery: Secondary | ICD-10-CM | POA: Diagnosis not present

## 2023-08-07 DIAGNOSIS — I5032 Chronic diastolic (congestive) heart failure: Secondary | ICD-10-CM | POA: Diagnosis not present

## 2023-08-07 DIAGNOSIS — Z96641 Presence of right artificial hip joint: Secondary | ICD-10-CM | POA: Diagnosis not present

## 2023-08-07 LAB — IRON,TIBC AND FERRITIN PANEL
%SAT: 19 % (ref 16–45)
Ferritin: 80 ng/mL (ref 16–288)
Iron: 60 ug/dL (ref 45–160)
TIBC: 311 ug/dL (ref 250–450)

## 2023-08-07 LAB — D-DIMER, QUANTITATIVE: D-Dimer, Quant: 2.13 ug{FEU}/mL — ABNORMAL HIGH (ref <0.50)

## 2023-08-08 ENCOUNTER — Other Ambulatory Visit: Payer: Self-pay | Admitting: Internal Medicine

## 2023-08-09 ENCOUNTER — Encounter: Payer: Self-pay | Admitting: Internal Medicine

## 2023-08-09 ENCOUNTER — Ambulatory Visit
Admission: RE | Admit: 2023-08-09 | Discharge: 2023-08-09 | Disposition: A | Discharge status: Home / Self Care | Payer: Medicare Other | Source: Ambulatory Visit | Attending: Internal Medicine | Admitting: Internal Medicine

## 2023-08-09 DIAGNOSIS — I5032 Chronic diastolic (congestive) heart failure: Secondary | ICD-10-CM | POA: Diagnosis not present

## 2023-08-09 DIAGNOSIS — J45909 Unspecified asthma, uncomplicated: Secondary | ICD-10-CM | POA: Diagnosis not present

## 2023-08-09 DIAGNOSIS — R600 Localized edema: Secondary | ICD-10-CM | POA: Diagnosis not present

## 2023-08-09 DIAGNOSIS — M7120 Synovial cyst of popliteal space [Baker], unspecified knee: Secondary | ICD-10-CM | POA: Diagnosis not present

## 2023-08-09 DIAGNOSIS — Z471 Aftercare following joint replacement surgery: Secondary | ICD-10-CM | POA: Diagnosis not present

## 2023-08-09 DIAGNOSIS — M109 Gout, unspecified: Secondary | ICD-10-CM | POA: Diagnosis not present

## 2023-08-09 DIAGNOSIS — I890 Lymphedema, not elsewhere classified: Secondary | ICD-10-CM | POA: Diagnosis not present

## 2023-08-09 DIAGNOSIS — Z96641 Presence of right artificial hip joint: Secondary | ICD-10-CM | POA: Diagnosis not present

## 2023-08-09 DIAGNOSIS — M7989 Other specified soft tissue disorders: Secondary | ICD-10-CM | POA: Diagnosis not present

## 2023-08-09 DIAGNOSIS — G610 Guillain-Barre syndrome: Secondary | ICD-10-CM | POA: Diagnosis not present

## 2023-08-09 NOTE — Telephone Encounter (Signed)
noted 

## 2023-08-09 NOTE — Telephone Encounter (Signed)
Per a verbal from Dr. Darrick Huntsman I gave [facility] nurse the order for a 3 layer unna boot to be changed twice weekly until able to put on a compression stocking. [facility] nurse stated that they are not able to do daily services right now.

## 2023-08-10 DIAGNOSIS — Z96641 Presence of right artificial hip joint: Secondary | ICD-10-CM | POA: Diagnosis not present

## 2023-08-10 DIAGNOSIS — Z471 Aftercare following joint replacement surgery: Secondary | ICD-10-CM | POA: Diagnosis not present

## 2023-08-10 DIAGNOSIS — G610 Guillain-Barre syndrome: Secondary | ICD-10-CM | POA: Diagnosis not present

## 2023-08-10 DIAGNOSIS — M109 Gout, unspecified: Secondary | ICD-10-CM | POA: Diagnosis not present

## 2023-08-10 DIAGNOSIS — I5032 Chronic diastolic (congestive) heart failure: Secondary | ICD-10-CM | POA: Diagnosis not present

## 2023-08-10 DIAGNOSIS — J45909 Unspecified asthma, uncomplicated: Secondary | ICD-10-CM | POA: Diagnosis not present

## 2023-08-13 ENCOUNTER — Telehealth: Payer: Self-pay

## 2023-08-13 DIAGNOSIS — Z96641 Presence of right artificial hip joint: Secondary | ICD-10-CM | POA: Diagnosis not present

## 2023-08-13 DIAGNOSIS — J45909 Unspecified asthma, uncomplicated: Secondary | ICD-10-CM | POA: Diagnosis not present

## 2023-08-13 DIAGNOSIS — M109 Gout, unspecified: Secondary | ICD-10-CM | POA: Diagnosis not present

## 2023-08-13 DIAGNOSIS — I5032 Chronic diastolic (congestive) heart failure: Secondary | ICD-10-CM | POA: Diagnosis not present

## 2023-08-13 DIAGNOSIS — G610 Guillain-Barre syndrome: Secondary | ICD-10-CM | POA: Diagnosis not present

## 2023-08-13 DIAGNOSIS — Z471 Aftercare following joint replacement surgery: Secondary | ICD-10-CM | POA: Diagnosis not present

## 2023-08-13 NOTE — Telephone Encounter (Signed)
FYI

## 2023-08-13 NOTE — Telephone Encounter (Signed)
Iona Coach called from [facility] to state this call is to move home health/occupational therapy evaluation for patient to the week of 08/12/2023.

## 2023-08-14 DIAGNOSIS — Z96641 Presence of right artificial hip joint: Secondary | ICD-10-CM | POA: Diagnosis not present

## 2023-08-14 DIAGNOSIS — I5032 Chronic diastolic (congestive) heart failure: Secondary | ICD-10-CM | POA: Diagnosis not present

## 2023-08-14 DIAGNOSIS — G610 Guillain-Barre syndrome: Secondary | ICD-10-CM | POA: Diagnosis not present

## 2023-08-14 DIAGNOSIS — J45909 Unspecified asthma, uncomplicated: Secondary | ICD-10-CM | POA: Diagnosis not present

## 2023-08-14 DIAGNOSIS — Z471 Aftercare following joint replacement surgery: Secondary | ICD-10-CM | POA: Diagnosis not present

## 2023-08-14 DIAGNOSIS — M109 Gout, unspecified: Secondary | ICD-10-CM | POA: Diagnosis not present

## 2023-08-15 DIAGNOSIS — Z471 Aftercare following joint replacement surgery: Secondary | ICD-10-CM | POA: Diagnosis not present

## 2023-08-15 DIAGNOSIS — Z96641 Presence of right artificial hip joint: Secondary | ICD-10-CM | POA: Diagnosis not present

## 2023-08-15 DIAGNOSIS — J45909 Unspecified asthma, uncomplicated: Secondary | ICD-10-CM | POA: Diagnosis not present

## 2023-08-15 DIAGNOSIS — I5032 Chronic diastolic (congestive) heart failure: Secondary | ICD-10-CM | POA: Diagnosis not present

## 2023-08-15 DIAGNOSIS — G610 Guillain-Barre syndrome: Secondary | ICD-10-CM | POA: Diagnosis not present

## 2023-08-15 DIAGNOSIS — M109 Gout, unspecified: Secondary | ICD-10-CM | POA: Diagnosis not present

## 2023-08-16 DIAGNOSIS — M109 Gout, unspecified: Secondary | ICD-10-CM | POA: Diagnosis not present

## 2023-08-16 DIAGNOSIS — G610 Guillain-Barre syndrome: Secondary | ICD-10-CM | POA: Diagnosis not present

## 2023-08-16 DIAGNOSIS — J45909 Unspecified asthma, uncomplicated: Secondary | ICD-10-CM | POA: Diagnosis not present

## 2023-08-16 DIAGNOSIS — I5032 Chronic diastolic (congestive) heart failure: Secondary | ICD-10-CM | POA: Diagnosis not present

## 2023-08-16 DIAGNOSIS — Z96641 Presence of right artificial hip joint: Secondary | ICD-10-CM | POA: Diagnosis not present

## 2023-08-16 DIAGNOSIS — Z471 Aftercare following joint replacement surgery: Secondary | ICD-10-CM | POA: Diagnosis not present

## 2023-08-17 ENCOUNTER — Telehealth: Payer: Self-pay

## 2023-08-17 DIAGNOSIS — Z96641 Presence of right artificial hip joint: Secondary | ICD-10-CM | POA: Diagnosis not present

## 2023-08-17 DIAGNOSIS — Z471 Aftercare following joint replacement surgery: Secondary | ICD-10-CM | POA: Diagnosis not present

## 2023-08-17 DIAGNOSIS — J45909 Unspecified asthma, uncomplicated: Secondary | ICD-10-CM | POA: Diagnosis not present

## 2023-08-17 DIAGNOSIS — I5032 Chronic diastolic (congestive) heart failure: Secondary | ICD-10-CM | POA: Diagnosis not present

## 2023-08-17 DIAGNOSIS — G610 Guillain-Barre syndrome: Secondary | ICD-10-CM | POA: Diagnosis not present

## 2023-08-17 DIAGNOSIS — M109 Gout, unspecified: Secondary | ICD-10-CM | POA: Diagnosis not present

## 2023-08-17 NOTE — Telephone Encounter (Signed)
Copied from CRM [id number]. Topic: Clinical - Home Health Verbal Orders >> Aug 17, 2023 11:19 AM Hector Shade B wrote: Caller/Agency: Sherlene Shams Number: [phone]  Service Requested: Occupational Therapy  Frequency: Evaluation only (1 visit)  Any new concerns about the patient? No

## 2023-08-20 DIAGNOSIS — Z96641 Presence of right artificial hip joint: Secondary | ICD-10-CM | POA: Diagnosis not present

## 2023-08-20 DIAGNOSIS — Z471 Aftercare following joint replacement surgery: Secondary | ICD-10-CM | POA: Diagnosis not present

## 2023-08-20 DIAGNOSIS — J45909 Unspecified asthma, uncomplicated: Secondary | ICD-10-CM | POA: Diagnosis not present

## 2023-08-20 DIAGNOSIS — G610 Guillain-Barre syndrome: Secondary | ICD-10-CM | POA: Diagnosis not present

## 2023-08-20 DIAGNOSIS — I5032 Chronic diastolic (congestive) heart failure: Secondary | ICD-10-CM | POA: Diagnosis not present

## 2023-08-20 DIAGNOSIS — M109 Gout, unspecified: Secondary | ICD-10-CM | POA: Diagnosis not present

## 2023-08-20 NOTE — Telephone Encounter (Signed)
LMTCB

## 2023-08-21 DIAGNOSIS — I5032 Chronic diastolic (congestive) heart failure: Secondary | ICD-10-CM | POA: Diagnosis not present

## 2023-08-21 DIAGNOSIS — Z471 Aftercare following joint replacement surgery: Secondary | ICD-10-CM | POA: Diagnosis not present

## 2023-08-21 DIAGNOSIS — Z96641 Presence of right artificial hip joint: Secondary | ICD-10-CM | POA: Diagnosis not present

## 2023-08-21 DIAGNOSIS — J45909 Unspecified asthma, uncomplicated: Secondary | ICD-10-CM | POA: Diagnosis not present

## 2023-08-21 DIAGNOSIS — M109 Gout, unspecified: Secondary | ICD-10-CM | POA: Diagnosis not present

## 2023-08-21 DIAGNOSIS — G610 Guillain-Barre syndrome: Secondary | ICD-10-CM | POA: Diagnosis not present

## 2023-08-23 NOTE — Telephone Encounter (Signed)
Verbal order was given. 

## 2023-08-24 DIAGNOSIS — M109 Gout, unspecified: Secondary | ICD-10-CM | POA: Diagnosis not present

## 2023-08-24 DIAGNOSIS — J45909 Unspecified asthma, uncomplicated: Secondary | ICD-10-CM | POA: Diagnosis not present

## 2023-08-24 DIAGNOSIS — Z471 Aftercare following joint replacement surgery: Secondary | ICD-10-CM | POA: Diagnosis not present

## 2023-08-24 DIAGNOSIS — I5032 Chronic diastolic (congestive) heart failure: Secondary | ICD-10-CM | POA: Diagnosis not present

## 2023-08-24 DIAGNOSIS — G610 Guillain-Barre syndrome: Secondary | ICD-10-CM | POA: Diagnosis not present

## 2023-08-24 DIAGNOSIS — Z96641 Presence of right artificial hip joint: Secondary | ICD-10-CM | POA: Diagnosis not present

## 2023-08-28 DIAGNOSIS — I5032 Chronic diastolic (congestive) heart failure: Secondary | ICD-10-CM | POA: Diagnosis not present

## 2023-08-28 DIAGNOSIS — M109 Gout, unspecified: Secondary | ICD-10-CM | POA: Diagnosis not present

## 2023-08-28 DIAGNOSIS — Z96641 Presence of right artificial hip joint: Secondary | ICD-10-CM | POA: Diagnosis not present

## 2023-08-28 DIAGNOSIS — Z471 Aftercare following joint replacement surgery: Secondary | ICD-10-CM | POA: Diagnosis not present

## 2023-08-28 DIAGNOSIS — J45909 Unspecified asthma, uncomplicated: Secondary | ICD-10-CM | POA: Diagnosis not present

## 2023-08-28 DIAGNOSIS — G610 Guillain-Barre syndrome: Secondary | ICD-10-CM | POA: Diagnosis not present

## 2023-08-31 DIAGNOSIS — E559 Vitamin D deficiency, unspecified: Secondary | ICD-10-CM | POA: Diagnosis not present

## 2023-08-31 DIAGNOSIS — Z96641 Presence of right artificial hip joint: Secondary | ICD-10-CM | POA: Diagnosis not present

## 2023-08-31 DIAGNOSIS — Z7982 Long term (current) use of aspirin: Secondary | ICD-10-CM | POA: Diagnosis not present

## 2023-08-31 DIAGNOSIS — I5032 Chronic diastolic (congestive) heart failure: Secondary | ICD-10-CM | POA: Diagnosis not present

## 2023-08-31 DIAGNOSIS — K219 Gastro-esophageal reflux disease without esophagitis: Secondary | ICD-10-CM | POA: Diagnosis not present

## 2023-08-31 DIAGNOSIS — Z471 Aftercare following joint replacement surgery: Secondary | ICD-10-CM | POA: Diagnosis not present

## 2023-08-31 DIAGNOSIS — M109 Gout, unspecified: Secondary | ICD-10-CM | POA: Diagnosis not present

## 2023-08-31 DIAGNOSIS — N3941 Urge incontinence: Secondary | ICD-10-CM | POA: Diagnosis not present

## 2023-08-31 DIAGNOSIS — G610 Guillain-Barre syndrome: Secondary | ICD-10-CM | POA: Diagnosis not present

## 2023-08-31 DIAGNOSIS — Z9181 History of falling: Secondary | ICD-10-CM | POA: Diagnosis not present

## 2023-08-31 DIAGNOSIS — J45909 Unspecified asthma, uncomplicated: Secondary | ICD-10-CM | POA: Diagnosis not present

## 2023-08-31 DIAGNOSIS — N3281 Overactive bladder: Secondary | ICD-10-CM | POA: Diagnosis not present

## 2023-09-03 DIAGNOSIS — I5032 Chronic diastolic (congestive) heart failure: Secondary | ICD-10-CM | POA: Diagnosis not present

## 2023-09-03 DIAGNOSIS — M109 Gout, unspecified: Secondary | ICD-10-CM | POA: Diagnosis not present

## 2023-09-03 DIAGNOSIS — Z471 Aftercare following joint replacement surgery: Secondary | ICD-10-CM | POA: Diagnosis not present

## 2023-09-03 DIAGNOSIS — J45909 Unspecified asthma, uncomplicated: Secondary | ICD-10-CM | POA: Diagnosis not present

## 2023-09-03 DIAGNOSIS — G610 Guillain-Barre syndrome: Secondary | ICD-10-CM | POA: Diagnosis not present

## 2023-09-03 DIAGNOSIS — Z96641 Presence of right artificial hip joint: Secondary | ICD-10-CM | POA: Diagnosis not present

## 2023-09-04 DIAGNOSIS — Z96641 Presence of right artificial hip joint: Secondary | ICD-10-CM | POA: Diagnosis not present

## 2023-09-04 DIAGNOSIS — J45909 Unspecified asthma, uncomplicated: Secondary | ICD-10-CM | POA: Diagnosis not present

## 2023-09-04 DIAGNOSIS — M109 Gout, unspecified: Secondary | ICD-10-CM | POA: Diagnosis not present

## 2023-09-04 DIAGNOSIS — I5032 Chronic diastolic (congestive) heart failure: Secondary | ICD-10-CM | POA: Diagnosis not present

## 2023-09-04 DIAGNOSIS — G610 Guillain-Barre syndrome: Secondary | ICD-10-CM | POA: Diagnosis not present

## 2023-09-04 DIAGNOSIS — Z471 Aftercare following joint replacement surgery: Secondary | ICD-10-CM | POA: Diagnosis not present

## 2023-09-07 DIAGNOSIS — G610 Guillain-Barre syndrome: Secondary | ICD-10-CM | POA: Diagnosis not present

## 2023-09-07 DIAGNOSIS — J45909 Unspecified asthma, uncomplicated: Secondary | ICD-10-CM | POA: Diagnosis not present

## 2023-09-07 DIAGNOSIS — Z96641 Presence of right artificial hip joint: Secondary | ICD-10-CM | POA: Diagnosis not present

## 2023-09-07 DIAGNOSIS — I5032 Chronic diastolic (congestive) heart failure: Secondary | ICD-10-CM | POA: Diagnosis not present

## 2023-09-07 DIAGNOSIS — M109 Gout, unspecified: Secondary | ICD-10-CM | POA: Diagnosis not present

## 2023-09-07 DIAGNOSIS — Z471 Aftercare following joint replacement surgery: Secondary | ICD-10-CM | POA: Diagnosis not present

## 2023-09-10 ENCOUNTER — Ambulatory Visit: Payer: Medicare Other | Admitting: Podiatry

## 2023-09-13 DIAGNOSIS — J45909 Unspecified asthma, uncomplicated: Secondary | ICD-10-CM | POA: Diagnosis not present

## 2023-09-13 DIAGNOSIS — G610 Guillain-Barre syndrome: Secondary | ICD-10-CM | POA: Diagnosis not present

## 2023-09-13 DIAGNOSIS — Z471 Aftercare following joint replacement surgery: Secondary | ICD-10-CM | POA: Diagnosis not present

## 2023-09-13 DIAGNOSIS — I5032 Chronic diastolic (congestive) heart failure: Secondary | ICD-10-CM | POA: Diagnosis not present

## 2023-09-13 DIAGNOSIS — M109 Gout, unspecified: Secondary | ICD-10-CM | POA: Diagnosis not present

## 2023-09-13 DIAGNOSIS — Z96641 Presence of right artificial hip joint: Secondary | ICD-10-CM | POA: Diagnosis not present

## 2023-09-17 DIAGNOSIS — G610 Guillain-Barre syndrome: Secondary | ICD-10-CM | POA: Diagnosis not present

## 2023-09-17 DIAGNOSIS — I5032 Chronic diastolic (congestive) heart failure: Secondary | ICD-10-CM | POA: Diagnosis not present

## 2023-09-17 DIAGNOSIS — M109 Gout, unspecified: Secondary | ICD-10-CM | POA: Diagnosis not present

## 2023-09-17 DIAGNOSIS — J45909 Unspecified asthma, uncomplicated: Secondary | ICD-10-CM | POA: Diagnosis not present

## 2023-09-17 DIAGNOSIS — Z471 Aftercare following joint replacement surgery: Secondary | ICD-10-CM | POA: Diagnosis not present

## 2023-09-17 DIAGNOSIS — Z96641 Presence of right artificial hip joint: Secondary | ICD-10-CM | POA: Diagnosis not present

## 2023-09-28 ENCOUNTER — Telehealth: Payer: Self-pay

## 2023-09-28 DIAGNOSIS — Z471 Aftercare following joint replacement surgery: Secondary | ICD-10-CM | POA: Diagnosis not present

## 2023-09-28 DIAGNOSIS — G610 Guillain-Barre syndrome: Secondary | ICD-10-CM | POA: Diagnosis not present

## 2023-09-28 DIAGNOSIS — M109 Gout, unspecified: Secondary | ICD-10-CM | POA: Diagnosis not present

## 2023-09-28 DIAGNOSIS — Z96641 Presence of right artificial hip joint: Secondary | ICD-10-CM | POA: Diagnosis not present

## 2023-09-28 DIAGNOSIS — J45909 Unspecified asthma, uncomplicated: Secondary | ICD-10-CM | POA: Diagnosis not present

## 2023-09-28 DIAGNOSIS — I5032 Chronic diastolic (congestive) heart failure: Secondary | ICD-10-CM | POA: Diagnosis not present

## 2023-09-28 NOTE — Telephone Encounter (Signed)
 Verbal orders given

## 2023-09-28 NOTE — Telephone Encounter (Signed)
Copied from CRM [id number]. Topic: Clinical - Home Health Verbal Orders >> Sep 28, 2023  1:06 PM Almira Coaster wrote: [facility]Agency[facility] Callback Number: [phone] Service Requested: Physical Therapy Frequency: 1 time a week for 7-8 weeks Any new concerns about the patient? Yes, back pain

## 2023-09-30 DIAGNOSIS — N3281 Overactive bladder: Secondary | ICD-10-CM | POA: Diagnosis not present

## 2023-09-30 DIAGNOSIS — N3941 Urge incontinence: Secondary | ICD-10-CM | POA: Diagnosis not present

## 2023-09-30 DIAGNOSIS — Z9181 History of falling: Secondary | ICD-10-CM | POA: Diagnosis not present

## 2023-09-30 DIAGNOSIS — K219 Gastro-esophageal reflux disease without esophagitis: Secondary | ICD-10-CM | POA: Diagnosis not present

## 2023-09-30 DIAGNOSIS — Z471 Aftercare following joint replacement surgery: Secondary | ICD-10-CM | POA: Diagnosis not present

## 2023-09-30 DIAGNOSIS — Z96641 Presence of right artificial hip joint: Secondary | ICD-10-CM | POA: Diagnosis not present

## 2023-09-30 DIAGNOSIS — M109 Gout, unspecified: Secondary | ICD-10-CM | POA: Diagnosis not present

## 2023-09-30 DIAGNOSIS — I5032 Chronic diastolic (congestive) heart failure: Secondary | ICD-10-CM | POA: Diagnosis not present

## 2023-09-30 DIAGNOSIS — G610 Guillain-Barre syndrome: Secondary | ICD-10-CM | POA: Diagnosis not present

## 2023-09-30 DIAGNOSIS — E559 Vitamin D deficiency, unspecified: Secondary | ICD-10-CM | POA: Diagnosis not present

## 2023-09-30 DIAGNOSIS — J45909 Unspecified asthma, uncomplicated: Secondary | ICD-10-CM | POA: Diagnosis not present

## 2023-09-30 DIAGNOSIS — Z7982 Long term (current) use of aspirin: Secondary | ICD-10-CM | POA: Diagnosis not present

## 2023-10-01 NOTE — Telephone Encounter (Signed)
 noted

## 2023-10-03 ENCOUNTER — Inpatient Hospital Stay: Payer: Medicare Other | Attending: Oncology

## 2023-10-03 ENCOUNTER — Inpatient Hospital Stay: Payer: Medicare Other | Admitting: Obstetrics and Gynecology

## 2023-10-03 VITALS — BP 132/60 | HR 76 | Temp 97.8°F | Resp 18 | Wt 195.0 lb

## 2023-10-03 DIAGNOSIS — Z85828 Personal history of other malignant neoplasm of skin: Secondary | ICD-10-CM | POA: Insufficient documentation

## 2023-10-03 DIAGNOSIS — R32 Unspecified urinary incontinence: Secondary | ICD-10-CM | POA: Insufficient documentation

## 2023-10-03 DIAGNOSIS — Z9079 Acquired absence of other genital organ(s): Secondary | ICD-10-CM | POA: Insufficient documentation

## 2023-10-03 DIAGNOSIS — I890 Lymphedema, not elsewhere classified: Secondary | ICD-10-CM | POA: Insufficient documentation

## 2023-10-03 DIAGNOSIS — Z9221 Personal history of antineoplastic chemotherapy: Secondary | ICD-10-CM | POA: Insufficient documentation

## 2023-10-03 DIAGNOSIS — Z8543 Personal history of malignant neoplasm of ovary: Secondary | ICD-10-CM

## 2023-10-03 DIAGNOSIS — Z90722 Acquired absence of ovaries, bilateral: Secondary | ICD-10-CM | POA: Diagnosis not present

## 2023-10-03 DIAGNOSIS — G620 Drug-induced polyneuropathy: Secondary | ICD-10-CM | POA: Insufficient documentation

## 2023-10-03 DIAGNOSIS — M5432 Sciatica, left side: Secondary | ICD-10-CM | POA: Diagnosis not present

## 2023-10-03 DIAGNOSIS — M5417 Radiculopathy, lumbosacral region: Secondary | ICD-10-CM

## 2023-10-03 DIAGNOSIS — Z90710 Acquired absence of both cervix and uterus: Secondary | ICD-10-CM | POA: Diagnosis not present

## 2023-10-03 NOTE — Progress Notes (Signed)
 Gynecologic Oncology Clinic Gynecologic Oncology Interval Visit  [facility] at [facility]  [phone] [phone]  Patient Care Team: Marylynn Verneita CROME, MD as PCP - General (Internal Medicine) End, Lonni, MD as PCP - Cardiology (Cardiology) Elby Webb Loges, MD as Referring Physician (Obstetrics and Gynecology) Melanee Annah BROCKS, MD as Consulting Physician (Oncology) Maurie Rayfield BIRCH, RN as Oncology Nurse Navigator Tamea Dedra CROME, MD as Consulting Physician (Pulmonary Disease)   Name of the patient: Melissa Anderson  [id number]  09-Jul-1941   Date of visit: 10/03/2023  Referring Provider: Dr Lily  Chief Complaint: stage II ovarian cancer surveillance  Subjective:  Melissa Anderson is a 83 y.o. 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   Her CA125 was noted to be elevated at her last visit.  Component Ref Range & Units 3 mo ago (10/18/22) 9 mo ago (04/12/22) 1 yr ago (10/12/21) 1 yr ago (07/13/21) 2 yr ago (01/05/21) 2 yr ago (09/08/20) 2 yr ago (06/02/20)  Cancer Antigen (CA) 125 0.0 - 38.1 U/mL 31.1 18.4 CM 16.9 CM 18.2 CM 16.4 CM 16.8 CM 18.6 CM   She has persistent Hip pain-  and plans for hip replacement.    Gynecologic Oncology History:  She was at her PCP office in March and was complaining of intermittent weakness and pain shooting down her right leg. Her PCP ordered an MRI of her lumbosacral spine which demonstrated this right adnexal mass as an incidental finding.   Radiologic Imaging: 11/12/2017 Lumbosacral spine MRI scan noted a multicystic 4 x 5.5 cm right adnexal mass with fluid blood levels. Prominent uterine endometrial  stripe.   12/20/2017 Pelvic Ultrasound: Uterus measures 9.7 x 4.1 x 5.9cm and is retroverted. Endometrial stripe measures 9 mm. Within it, as outlines by a small amount of fluid, there is a 1.4 x 0.7 x 1.4 cm polypoid echogenic structure with internal blood flow and small cystic components. A similar appearing but smaller 5 x 4 x 3 mm echogenic nodular density is noted within the endometrial cavity as well. The uterus is otherwise normal in appearance. There is a multiloculated complex cystic structure in the right adnexa measuring 6.3 x 6.1 x 4.7 cm. This has solid appearing components as well as anechoic portions, separated by thick septations. On MRI, many of these loculations demonstrate fluid-filled levels. No normal ovarian parenchyma is identified. The left ovary was not seen. There is no pelvic free fluid.  She subsequently underwent an EMB with her gynecologist and this was negative.   She apparently was not aware that she had a cyst this size until she was seen by her provider who recommended Gyn Oncology consultation for further evaluation. She was seen by Dr. Arnette who recommended surgery and further imaging and tumor markers.   02/05/2018: CA 125 = 18, CA 19-9 = 8, CEA <0.5, OVA1 = 4 (low risk)  02/09/2018 CT Scan A/P: multi-septated complex cystic lesion noted in the right pelvis.adnexa immediately superior to the uterus which measures approximately 6.9 x 5.3 am in greatest axial dimensions and approximately 5.4 cm in greatest cranial caudal dimensions.  Abnormal thickening of the endometrial cavity which measures 1.3 cm in greatest width.  Multiple peripelvic cysts are identified in bilateral renal hilar regions measuring  up to 3.6 cm on the right and 2.9 cm on the left.  On 02/15/18, she underwent TLH, BSO, with bilateral pelvic LN dissection, peritoneal biopsies, and omental biopsy with Dr. Forde at [facility]. No spread beyond tube and ovaries.  She was discharged on POD1 w/o complication.     Pathology A. 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  See the synoptic report. Note: The tumor is positive for PAX8, WT1, and p53 (strong, uniform). Based on the presence of tubal intraepithelial carcinoma, this is considered a tubal primary.  B.  Endometrium, curettage: Fragments of endometrial polyp.  C. Uterus, total hysterectomy:   Uterus:    Endometrium: Endometrial polyp with focal glandular crowding, see note.    Myometrium: Adenomyosis, leiomyomata (up to 1 cm).    Cervix: No pathologic diagnosis.     Serosa: No pathologic diagnosis.  Note: The areas of endometrial glandular crowding are not cytologically distinct from the background and therefore do not meet the criteria for endometrial intraepithelial neoplasia (EIN).  D. Right pelvic lymph nodes, lymphadenectomy: Three lymph nodes, negative for malignancy (0/3).   E. Left pelvic lymph nodes, lymphadenectomy: Three lymph nodes, negative for malignancy (0/3).   F. Omentum, omentectomy: Benign omentum.  G. Peritoneal biopsy #1: Benign fibromuscular tissue.  H. Peritoneal biopsy #2: Benign fibrous tissue.  I. Peritoneal biopsy #3: Benign fibroadipose tissue.  J. Peritoneal biopsy #4: Benign fibroadipose tissue.  A. Pelvic Washings: - Negative. No Evidence of Malignancy  03/21/18. She was referred to Dr. Melanee, medical-oncology at [facility] and initiated cycle 1 of adjuvant carbo-Taxol  on 03/21/18.   05/28/2018 Patient received cycle 4 of chemotherapy. After 4 cycles of chemotherapy patient developed progressive weakness in her bilateral lower extremities and was admitted to the hospital in [place] where she developed worsening respiratory failure and had to be intubated was airlifted to [place].  Patient was eventually  extubated.  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  She was given IVIG as well.  Of note she has received flu shot consistently in the past and has never had these complications ever. Patient was then sent to a neuro rehab unit and was eventually discharged home.   08/16/2018 - CT C/A/P IMPRESSION: 1. Status post hysterectomy and bilateral salpingo oophorectomy. No findings for metastatic ovarian cancer. No peritoneal surface or omental disease or abdominal/pelvic lymphadenopathy. 2. Mild chronic scarring changes in the lungs but no findings worrisome for pulmonary metastatic disease.  She has had her covid vaccines without complication.   Imaging was 06/01/20 which was negative for metastatic disease.   03/04/2021 CT C/A/P IMPRESSION: 1. Stable CTs of the chest, abdomen and pelvis status post hysterectomy. No evidence of local recurrence or metastatic disease. 2. Stable pulmonary scarring and chronic central airway thickening. 3. Stable incidental findings including renal sinus parapelvic cysts, probable pelvic floor laxity and mild Aortic Atherosclerosis (ICD10-I70.0).  08/16/2021 IMPRESSION: 1. Bilateral parapelvic renal cysts. 2. Severe right hip degenerative changes. 3.  Aortic Atherosclerosis (ICD10-I70.0).  10/19/2020 RECOMMENDATION: Screening mammogram in one year.(Code:[id number])  BI-RADS CATEGORY  1: Negative.  10/13/22  CT C/A/P IMPRESSION: 1. Stable examination without evidence of local recurrence or metastatic disease within the chest, abdomen, or pelvis. 2. Small hiatal hernia with  gas fluid levels in the esophagus suggestive of gastroesophageal reflux. 3. Moderate volume of formed stool throughout the colon. Correlate for constipation. 4. Pelvic floor laxity with a small cystocele, vaginal prolapse and probable rectal  prolapse. 5.  Aortic Atherosclerosis (ICD10-I70.0).   CA 125 (not elevated at time of diagnosis) 10/18/2022 31.1 04/12/2022 18.4 10/12/2021 16.9 07/13/2021 18.2 01/05/21 16.4 09/08/2020 16.8 06/02/20 18.6 02/11/20 16 10/09/2018 16.9 02/05/2018 18 (diagnosis)    Other medical issues She has a h/o prior breast mass and she requested we order her mammogram done on 11/22/2022 IMPRESSION: BI-RADS CATEGORY  1: Negative. RECOMMENDATION: Screening mammogram in one year  Genetic Testing:  - [employer] 83 gene Multi-Cancer panel was negative for pathogenic mutation in any genes. No VUS detected.   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   Aortic atherosclerosis (HCC) 07/14/2020   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   History of ovarian cancer 02/11/2020   Acute pain of right shoulder 08/15/2018   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   Vitamin D  deficiency 05/08/2018   Neuropathy 05/08/2018   Overactive bladder 05/08/2018   Bilateral leg edema 05/08/2018   HLD (hyperlipidemia) 05/08/2018   Goals of care, counseling/discussion 02/25/2018   Ovarian cancer (HCC) 02/22/2018   Graves' disease 02/13/2018    Past Medical History: 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     04/2021 m[facility]s   Cancer (HCC) 2019   b/l ovaries and left fallopian tube pelvic lymph nodes negative    Guillain Barr syndrome (HCC)    2/2 flu shot 05/2018,  intubated x 8 days   Hyperlipidemia    Lymphedema    Overactive bladder    Thyroid  disease    UTI (urinary tract infection)    Vitamin D  deficiency     Past Surgical History: Past Surgical History:  Procedure Laterality Date   ABDOMINAL HYSTERECTOMY     02/15/18    CHOLECYSTECTOMY  2008   LAPAROSCOPIC VAGINAL HYSTERECTOMY WITH SALPINGO OOPHORECTOMY  02/15/2018   PORTACATH PLACEMENT Left 02/25/2018   Procedure: INSERTION PORT-A-CATH;  Surgeon: Dessa Reyes ORN, MD;  Location: [facility] ORS;  Service: General;  Laterality: Left;   TOTAL ABDOMINAL HYSTERECTOMY W/ BILATERAL SALPINGOOPHORECTOMY  02/15/2018   lymph nodes removed    OB History:  OB History  Gravida Para Term Preterm AB Living  2     2  SAB IAB Ectopic Multiple Live Births          # Outcome Date GA Lbr Len/2nd Weight Sex Type Anes PTL Lv  2 Gravida           1 Gravida             Obstetric Comments  Menstrual age: 16    Age 1st Pregnancy: 35    Family History: Family History  Problem  Relation Age of Onset   Sudden death Mother 41       drowning in hurricane Katrina   Pancreatic cancer Father        deceased early 87s; smoker   Alcohol abuse Father    COPD Father    Melanoma Sister        deceased at 38   COPD Sister    Heart disease Brother        Angina   Coronary artery disease Brother    Heart disease Brother        Heart murmur   Prostate cancer Maternal Uncle        deceased 80   Breast cancer Paternal Aunt        unsure of this dx    Social History: Social History   Socioeconomic History   Marital status: Widowed    Spouse name: Not on file   Number of children: Not on file   Years of education: Not on file   Highest education level: Not on file  Occupational History   Not on file  Tobacco Use   Smoking status: Former     Current packs/day: 0.00    Average packs/day: 1 pack/day for 23.0 years (23.0 ttl pk-yrs)    Types: Cigarettes    Start date: 40    Quit date: 73    Years since quitting: 38.1   Smokeless tobacco: Never  Vaping Use   Vaping status: Never Used  Substance and Sexual Activity   Alcohol use: Never   Drug use: Never   Sexual activity: Not Currently  Other Topics Concern   Not on file  Social History Occupational Hygienist, bachelors degree    Lives in [place] here visiting daughter Eleanor    Former smoker quit age 41 y.o    Social Drivers of Corporate Investment Banker Strain: Low Risk  (07/09/2023)   Received from [facility]   Overall Financial Resource Strain (CARDIA)    Difficulty of Paying Living Expenses: Not hard at all  Food Insecurity: No Food Insecurity (07/09/2023)   Received from [facility]   Hunger Vital Sign    Worried About Running Out of Food in the Last Year: Never true    Ran Out of Food in the Last Year: Never true  Transportation Needs: No Transportation Needs (07/10/2023)   Received from [facility] - Transportation    In the past 12 months, has lack of transportation kept you from medical appointments or from getting medications?: No    Lack of Transportation (Non-Medical): No  Physical Activity: Insufficiently Active (12/21/2022)   Exercise Vital Sign    Days of Exercise per Week: 3 days    Minutes of Exercise per Session: 30 min  Stress: No Stress Concern Present (12/21/2022)   [employer] of Occupational Health - Occupational Stress Questionnaire    Feeling of Stress : Not at all  Social Connections: Unknown (12/21/2022)   Social Connection and Isolation Panel [NHANES]    Frequency of Communication with Friends and Family: Not on file    Frequency of Social Gatherings with Friends and Family: More than three times a week    Attends Religious Services: Not on file    Active Member  of Clubs or Organizations: Not on file    Attends Banker Meetings: Not on file    Marital Status: Not on file  Intimate Partner  Violence: Not At Risk (12/21/2022)   Humiliation, Afraid, Rape, and Kick questionnaire    Fear of Current or Ex-Partner: No    Emotionally Abused: No    Physically Abused: No    Sexually Abused: No   Immunization History  Administered Date(s) Administered   Influenza, High Dose Seasonal PF 05/31/2018   Influenza-Unspecified 05/13/2018   PFIZER Comirnaty(Gray Top)Covid-19 Tri-Sucrose Vaccine 11/15/2020   PFIZER(Purple Top)SARS-COV-2 Vaccination 02/09/2020, 03/02/2020   Allergies: Allergies  Allergen Reactions   Novocain [Procaine] Palpitations   Celebrex  [Celecoxib ] Other (See Comments)    Decreased gfr   Crestor  [Rosuvastatin ]     Myopathy     Influenza Vaccines     ?GBS 05/2018    Lipitor [Atorvastatin Calcium ]     Myalgias      Review of Systems General: no complaints  HEENT: no complaints  Lungs: Shortness of breath chronic  Cardiac: no complaints  GI: no complaints  GU: incontinence o/w no complaints  Musculoskeletal: left sciatic new onset  Extremities: Leg swelling chronic  Skin: no complaints  Neuro: Peripheral neuropathy complaints  Endocrine: no complaints  Psych: no complaints        Objective:  Physical Examination: BP 132/60 (BP Location: Left Arm, Patient Position: Sitting, Cuff Size: Large)   Pulse 76   Temp 97.8 F (36.6 C) (Tympanic)   Resp 18   Wt 195 lb (88.5 kg)   SpO2 98%   BMI 31.47 kg/m    GENERAL: Patient is an elderly appearing female in no acute distress. She is using a walker HEENT:  Atraumatic and normocephalic. PERRL, neck supple. NODES:  No cervical, supraclavicular, axillary, or inguinal lymphadenopathy palpated.  LUNGS:  normal respiratory effort ABDOMEN:  Soft, nontender. Nondistended. No masses/ascites/hernia/or hepatomegaly.  EXTREMITIES:  bilateral edema R>L. Scar RLE.    NEURO:  Nonfocal. Well oriented.  Appropriate affect.  Pelvic: EGBUS: no lesions Cervix: surgically absent Vagina: no lesions, no discharge or bleeding Uterus: surgically absent BME: no palpable masses Rectovaginal: deferred  IMAGING As per HPI  Assessment:  Melissa Anderson is a 83 y.o. 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 Forde. She has initiated chemotherapy 03/21/2018 with carbo-Taxol  and is treated by Dr. Melanee at [facility]. Chemotherapy discontinued after 05/28/2018 due to GBS from flu shot. Clinically NED. Radiographically NED in 09/2022. CA 125 pending.   Peripheral neuropathy. - post chemotherapy  Symptomatic bilateral lymphedema, possibly secondary to surgery.   She has completed germline genetic testing which was negative for mutation.   Hip Pain- secondary to severe degenerative changes. S/p right hip replacement  Urinary incontinence- stable  Left sciatica  Medical co-morbidities complicating care: BMI of 31 Plan:   CA125 pending today.   Somatic testing was not recommended at this time because she does not have a BRCA mutation and is not a candidate for maintenance PARP inhibitor after completing primary chemotherapy.  Somatic testing for mutation and HRD can be considered should she have recurrence.   Lymphedema and other medical issues-continue to follow-up with prior providers.   Hip pain s/p hip replacement with Dr. Sande  Urinary incontinence -continue to follow with urogynecology as needed.   Left sciatic pain - recommended MRI L/S spine  I have provided her names of local physicians for primary care services and dermatologist for annual total body skin cancer screening.   I discussed the assessment and treatment plan  with the patient. The patient was provided an opportunity to ask questions and all were  answered. The patient agreed with the plan and demonstrated an understanding of the instructions.   Melissa Sarver Isidor Constable, MD

## 2023-10-03 NOTE — Progress Notes (Signed)
 Patient is wanting to know what kind of doctor she should see for a siatic nerve issue?   Patient states that she is taking Crestor , but it says on her allergy list that she is allergic to it. She sees Dr. Mady next week so she is going to ask about the Crestor  medication and if she should be taking it or not.

## 2023-10-04 DIAGNOSIS — J45909 Unspecified asthma, uncomplicated: Secondary | ICD-10-CM | POA: Diagnosis not present

## 2023-10-04 DIAGNOSIS — Z471 Aftercare following joint replacement surgery: Secondary | ICD-10-CM | POA: Diagnosis not present

## 2023-10-04 DIAGNOSIS — M109 Gout, unspecified: Secondary | ICD-10-CM | POA: Diagnosis not present

## 2023-10-04 DIAGNOSIS — I5032 Chronic diastolic (congestive) heart failure: Secondary | ICD-10-CM | POA: Diagnosis not present

## 2023-10-04 DIAGNOSIS — G610 Guillain-Barre syndrome: Secondary | ICD-10-CM | POA: Diagnosis not present

## 2023-10-04 DIAGNOSIS — Z96641 Presence of right artificial hip joint: Secondary | ICD-10-CM | POA: Diagnosis not present

## 2023-10-04 LAB — CA 125: Cancer Antigen (CA) 125: 24.7 U/mL (ref 0.0–38.1)

## 2023-10-10 ENCOUNTER — Ambulatory Visit
Admission: RE | Admit: 2023-10-10 | Discharge: 2023-10-10 | Disposition: A | Discharge status: Home / Self Care | Payer: Medicare Other | Source: Ambulatory Visit | Attending: Obstetrics and Gynecology | Admitting: Obstetrics and Gynecology

## 2023-10-10 DIAGNOSIS — M5115 Intervertebral disc disorders with radiculopathy, thoracolumbar region: Secondary | ICD-10-CM | POA: Diagnosis not present

## 2023-10-10 DIAGNOSIS — M5116 Intervertebral disc disorders with radiculopathy, lumbar region: Secondary | ICD-10-CM | POA: Diagnosis not present

## 2023-10-10 DIAGNOSIS — M1612 Unilateral primary osteoarthritis, left hip: Secondary | ICD-10-CM | POA: Diagnosis not present

## 2023-10-10 DIAGNOSIS — M4726 Other spondylosis with radiculopathy, lumbar region: Secondary | ICD-10-CM | POA: Diagnosis not present

## 2023-10-10 DIAGNOSIS — M5417 Radiculopathy, lumbosacral region: Secondary | ICD-10-CM

## 2023-10-10 DIAGNOSIS — M48061 Spinal stenosis, lumbar region without neurogenic claudication: Secondary | ICD-10-CM | POA: Diagnosis not present

## 2023-10-10 MED ORDER — GADOBUTROL 1 MMOL/ML IV SOLN
9.0000 mL | Freq: Once | INTRAVENOUS | Status: AC | PRN
  Administered 2023-10-10: 9 mL via INTRAVENOUS

## 2023-10-11 ENCOUNTER — Ambulatory Visit: Payer: Medicare Other | Admitting: Internal Medicine

## 2023-10-12 ENCOUNTER — Telehealth: Payer: Self-pay | Admitting: Internal Medicine

## 2023-10-12 ENCOUNTER — Telehealth: Payer: Self-pay

## 2023-10-12 DIAGNOSIS — I5032 Chronic diastolic (congestive) heart failure: Secondary | ICD-10-CM

## 2023-10-12 DIAGNOSIS — N3281 Overactive bladder: Secondary | ICD-10-CM

## 2023-10-12 DIAGNOSIS — J45909 Unspecified asthma, uncomplicated: Secondary | ICD-10-CM

## 2023-10-12 DIAGNOSIS — Z471 Aftercare following joint replacement surgery: Secondary | ICD-10-CM

## 2023-10-12 DIAGNOSIS — M109 Gout, unspecified: Secondary | ICD-10-CM

## 2023-10-12 DIAGNOSIS — M47816 Spondylosis without myelopathy or radiculopathy, lumbar region: Secondary | ICD-10-CM

## 2023-10-12 DIAGNOSIS — E559 Vitamin D deficiency, unspecified: Secondary | ICD-10-CM

## 2023-10-12 DIAGNOSIS — K219 Gastro-esophageal reflux disease without esophagitis: Secondary | ICD-10-CM

## 2023-10-12 DIAGNOSIS — N3941 Urge incontinence: Secondary | ICD-10-CM

## 2023-10-12 DIAGNOSIS — G610 Guillain-Barre syndrome: Secondary | ICD-10-CM

## 2023-10-12 DIAGNOSIS — Z9181 History of falling: Secondary | ICD-10-CM

## 2023-10-12 DIAGNOSIS — M160 Bilateral primary osteoarthritis of hip: Secondary | ICD-10-CM

## 2023-10-12 DIAGNOSIS — Z96641 Presence of right artificial hip joint: Secondary | ICD-10-CM

## 2023-10-12 DIAGNOSIS — Z7982 Long term (current) use of aspirin: Secondary | ICD-10-CM

## 2023-10-12 NOTE — Addendum Note (Signed)
Addended by: Sherlene Shams on: 10/12/2023 03:45 PM   Modules accepted: Orders

## 2023-10-12 NOTE — Telephone Encounter (Signed)
Copied from CRM [id number]. Topic: General - Other >> Oct 12, 2023  9:22 AM Truddie Crumble wrote: Reason for CRM: patient called back and she stated she wants to go to the physical therapy place at [facility]

## 2023-10-12 NOTE — Addendum Note (Signed)
Addended by: Sherlene Shams on: 10/12/2023 12:04 PM   Modules accepted: Orders

## 2023-10-12 NOTE — Telephone Encounter (Signed)
LMTCB. Need to get the name of the physical therapy place she is wanting to go to.

## 2023-10-12 NOTE — Telephone Encounter (Signed)
Pt would like a referral to [facility] Physical Therapy.

## 2023-10-12 NOTE — Telephone Encounter (Signed)
I have placed in quick sign folder for signature.

## 2023-10-12 NOTE — Telephone Encounter (Signed)
Copied from CRM [id number]. Topic: General - Other >> Oct 12, 2023  8:08 AM Gurney Maxin H wrote: Reason for CRM: Patient is asking for a copy of the authorization form to get another disability tag, states the original one she sent was lost in the mail. States she can have someone pick up the form.  Melissa Anderson [phone]

## 2023-10-12 NOTE — Telephone Encounter (Signed)
Copied from CRM [id number]. Topic: General - Other >> Oct 12, 2023  1:34 PM Truddie Crumble wrote: Reason for CRM: patient called stating she would like to [facility] for her physical therapy

## 2023-10-12 NOTE — Telephone Encounter (Signed)
See previous message

## 2023-10-12 NOTE — Telephone Encounter (Signed)
uCopied from CRM [id number]. Topic: Clinical - Medical Advice >> Oct 12, 2023  8:47 AM Almira Coaster wrote: Reason for CRM: [facility] calling to inform that patient has declined to continue with home health physical therapy, states that she prefers to go to an outpatient setting. If you have any questions or concerns their number is [phone].

## 2023-10-12 NOTE — Telephone Encounter (Signed)
Pt is aware that referral has been placed.

## 2023-10-12 NOTE — Telephone Encounter (Signed)
Copied from CRM [id number]. Topic: Referral - Request for Referral >> Oct 12, 2023  8:05 AM Gurney Maxin H wrote: Did the patient discuss referral with their provider in the last year? Yes (If No - schedule appointment) (If Yes - send message)  Appointment offered? No  Type of order/referral and detailed reason for visit: Physical Therapy  Preference of office, provider, location: [facility], patient states she wants to go in for physical therapy instead of having them to come to the house  If referral order, have you been seen by this specialty before? Yes, currently has home physical therapy (If Yes, this issue or another issue? When? Where?Currently  Can we respond through MyChart? No

## 2023-10-12 NOTE — Telephone Encounter (Signed)
Pt called back and stated that now she would like to go to [facility] for physical therapy

## 2023-10-16 NOTE — Telephone Encounter (Signed)
Pt is aware that form is complete and ready for pick up.

## 2023-10-17 ENCOUNTER — Encounter: Payer: Self-pay | Admitting: Obstetrics and Gynecology

## 2023-10-17 ENCOUNTER — Other Ambulatory Visit: Payer: Medicare Other

## 2023-10-19 ENCOUNTER — Telehealth: Payer: Self-pay

## 2023-10-19 NOTE — Telephone Encounter (Signed)
 Spoke with pt and she stated that she is going to [facility] on Monday for the lymphedema pt is wanting to have the PT referral sent to them so she can have both done at the same place.

## 2023-10-19 NOTE — Addendum Note (Signed)
 Addended by: Sherlene Shams on: 10/19/2023 12:43 PM   Modules accepted: Orders

## 2023-10-19 NOTE — Telephone Encounter (Signed)
 Pt is aware.

## 2023-10-19 NOTE — Telephone Encounter (Signed)
 Copied from CRM [id number]. Topic: Referral - Question >> Oct 19, 2023 10:06 AM Fredrich Romans wrote: Reason for CRM: Patient called in stating that her referral was suppose to be sent over to [facility] Physical Therapy.

## 2023-10-22 DIAGNOSIS — M4716 Other spondylosis with myelopathy, lumbar region: Secondary | ICD-10-CM | POA: Diagnosis not present

## 2023-10-22 DIAGNOSIS — M25551 Pain in right hip: Secondary | ICD-10-CM | POA: Diagnosis not present

## 2023-10-23 ENCOUNTER — Other Ambulatory Visit: Payer: Self-pay | Admitting: Internal Medicine

## 2023-10-23 DIAGNOSIS — Z1231 Encounter for screening mammogram for malignant neoplasm of breast: Secondary | ICD-10-CM

## 2023-10-25 DIAGNOSIS — M4716 Other spondylosis with myelopathy, lumbar region: Secondary | ICD-10-CM | POA: Diagnosis not present

## 2023-10-25 DIAGNOSIS — M25551 Pain in right hip: Secondary | ICD-10-CM | POA: Diagnosis not present

## 2023-10-29 DIAGNOSIS — M25551 Pain in right hip: Secondary | ICD-10-CM | POA: Diagnosis not present

## 2023-10-29 DIAGNOSIS — M4716 Other spondylosis with myelopathy, lumbar region: Secondary | ICD-10-CM | POA: Diagnosis not present

## 2023-10-31 ENCOUNTER — Encounter: Payer: Self-pay | Admitting: Obstetrics and Gynecology

## 2023-11-01 DIAGNOSIS — M4716 Other spondylosis with myelopathy, lumbar region: Secondary | ICD-10-CM | POA: Diagnosis not present

## 2023-11-01 DIAGNOSIS — M25551 Pain in right hip: Secondary | ICD-10-CM | POA: Diagnosis not present

## 2023-11-05 DIAGNOSIS — M4716 Other spondylosis with myelopathy, lumbar region: Secondary | ICD-10-CM | POA: Diagnosis not present

## 2023-11-05 DIAGNOSIS — M25551 Pain in right hip: Secondary | ICD-10-CM | POA: Diagnosis not present

## 2023-11-08 DIAGNOSIS — M4716 Other spondylosis with myelopathy, lumbar region: Secondary | ICD-10-CM | POA: Diagnosis not present

## 2023-11-08 DIAGNOSIS — M25551 Pain in right hip: Secondary | ICD-10-CM | POA: Diagnosis not present

## 2023-11-11 NOTE — Progress Notes (Deleted)
 MRN : [id number]  Melissa Anderson is a 83 y.o. (1941-07-07) female who presents with chief complaint of legs swell.  History of Present Illness:   The patient returns to the office for followup evaluation regarding leg swelling.  She is scheduled to have a right total hip replacement in November and is concerned regarding the impact that her lymphedema will have on the surgery.  The swelling has persisted and the pain associated with swelling continues.  However, she notes that the pain is very sharp very severe and seems to be associated with standing and walking and primarily involving the feet themselves.  There have not been any interval development of a ulcerations or wounds.   Since the previous visit the patient has been trying to wear graduated compression stockings and has noted little if any improvement in the lymphedema.  She is also been using her lymph pump for 1 hour twice a day almost every day.  Overall she feels that her lymphedema is about the same.  There have been no interval episodes of cellulitis.  No outpatient medications have been marked as taking for the 11/12/23 encounter (Appointment) with Gilda Crease, Latina Craver, MD.    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    04/2021 [facility]   Cancer (HCC) 2019   b/l ovaries and left fallopian tube pelvic lymph nodes negative    Guillain Barr syndrome (HCC)    2/2 flu shot 05/2018,  intubated x 8 days   Hyperlipidemia    Lymphedema    Overactive bladder    Thyroid disease    UTI (urinary tract infection)    Vitamin D deficiency     Past Surgical History:  Procedure Laterality Date   ABDOMINAL HYSTERECTOMY     02/15/18    CHOLECYSTECTOMY  2008   LAPAROSCOPIC VAGINAL HYSTERECTOMY WITH SALPINGO OOPHORECTOMY  02/15/2018   PORTACATH PLACEMENT Left 02/25/2018   Procedure: INSERTION  PORT-A-CATH;  Surgeon: Earline Mayotte, MD;  Location: [facility] ORS;  Service: General;  Laterality: Left;   TOTAL ABDOMINAL HYSTERECTOMY W/ BILATERAL SALPINGOOPHORECTOMY  02/15/2018   lymph nodes removed    Social History Social History   Tobacco Use   Smoking status: Former    Current packs/day: 0.00    Average packs/day: 1 pack/day for 23.0 years (23.0 ttl pk-yrs)    Types: Cigarettes    Start date: 1964    Quit date: 75    Years since quitting: 38.2   Smokeless tobacco: Never  Vaping Use   Vaping status: Never Used  Substance Use Topics   Alcohol use: Never   Drug use: Never    Family History Family History  Problem Relation Age of Onset   Sudden death Mother 46       drowning in hurricane Katrina   Pancreatic cancer Father        deceased early 66s; smoker   Alcohol abuse Father    COPD Father    Melanoma Sister        deceased at 30   COPD Sister    Heart disease Brother        Angina   Coronary artery disease Brother    Heart disease  Brother        Heart murmur   Prostate cancer Maternal Uncle        deceased 8   Breast cancer Paternal Aunt        unsure of this dx    Allergies  Allergen Reactions   Novocain [Procaine] Palpitations   Celebrex [Celecoxib] Other (See Comments)    Decreased gfr   Crestor [Rosuvastatin]     Myopathy     Influenza Vaccines     ?GBS 05/2018    Lipitor [Atorvastatin Calcium]     Myalgias       REVIEW OF SYSTEMS (Negative unless checked)  Constitutional: [] Weight loss  [] Fever  [] Chills Cardiac: [] Chest pain   [] Chest pressure   [] Palpitations   [] Shortness of breath when laying flat   [] Shortness of breath with exertion. Vascular:  [] Pain in legs with walking   [x] Pain in legs with standing  [] History of DVT   [] Phlebitis   [x] Swelling in legs   [] Varicose veins   [] Non-healing ulcers Pulmonary:   [] Uses home oxygen   [] Productive cough   [] Hemoptysis   [] Wheeze  [] COPD   [] Asthma Neurologic:  [] Dizziness    [] Seizures   [] History of stroke   [] History of TIA  [] Aphasia   [] Vissual changes   [] Weakness or numbness in arm   [] Weakness or numbness in leg Musculoskeletal:   [] Joint swelling   [] Joint pain   [] Low back pain Hematologic:  [] Easy bruising  [] Easy bleeding   [] Hypercoagulable state   [] Anemic Gastrointestinal:  [] Diarrhea   [] Vomiting  [] Gastroesophageal reflux/heartburn   [] Difficulty swallowing. Genitourinary:  [] Chronic kidney disease   [] Difficult urination  [] Frequent urination   [] Blood in urine Skin:  [] Rashes   [] Ulcers  Psychological:  [] History of anxiety   []  History of major depression.  Physical Examination  There were no vitals filed for this visit. There is no height or weight on file to calculate BMI. Gen: WD/WN, NAD Head: [place]/AT, No temporalis wasting.  Ear/Nose/Throat: Hearing grossly intact, nares w/o erythema or drainage, pinna without lesions Eyes: PER, EOMI, sclera nonicteric.  Neck: Supple, no gross masses.  No JVD.  Pulmonary:  Good air movement, no audible wheezing, no use of accessory muscles.  Cardiac: RRR, precordium not hyperdynamic. Vascular:  scattered varicosities present bilaterally.  Mild venous stasis changes to the legs bilaterally.  3-4+ soft pitting edema, CEAP [id number]  Vessel Right Left  Radial Palpable Palpable  Gastrointestinal: soft, non-distended. No guarding/no peritoneal signs.  Musculoskeletal: M/S 5/5 throughout.  No deformity.  Neurologic: CN 2-12 intact. Pain and light touch intact in extremities.  Symmetrical.  Speech is fluent. Motor exam as listed above. Psychiatric: Judgment intact, Mood & affect appropriate for pt's clinical situation. Dermatologic: Venous rashes no ulcers noted.  No changes consistent with cellulitis. Lymph : No lichenification or skin changes of chronic lymphedema.  CBC Lab Results  Component Value Date   WBC 7.8 08/06/2023   HGB 12.1 08/06/2023   HCT 37.2 08/06/2023   MCV 96.9 08/06/2023   PLT 335.0  08/06/2023    BMET    Component Value Date/Time   NA 140 08/06/2023 1146   K 4.4 08/06/2023 1146   CL 105 08/06/2023 1146   CO2 27 08/06/2023 1146   GLUCOSE 83 08/06/2023 1146   BUN 29 (H) 08/06/2023 1146   CREATININE 0.88 08/06/2023 1146   CREATININE 0.77 11/18/2021 1510   CALCIUM 9.2 08/06/2023 1146   GFRNONAA >60 02/20/2023 0821   GFRAA >  60 08/08/2018 0859   CrCl cannot be calculated (Patient's most recent lab result is older than the maximum 21 days allowed.).  COAG No results found for: "INR", "PROTIME"  Radiology No results found.   Assessment/Plan There are no diagnoses linked to this encounter.   Levora Dredge, MD  11/11/2023 11:40 AM

## 2023-11-12 ENCOUNTER — Ambulatory Visit (INDEPENDENT_AMBULATORY_CARE_PROVIDER_SITE_OTHER): Payer: Medicare Other | Admitting: Vascular Surgery

## 2023-11-12 DIAGNOSIS — I890 Lymphedema, not elsewhere classified: Secondary | ICD-10-CM

## 2023-11-12 DIAGNOSIS — I700 Atherosclerosis of aorta: Secondary | ICD-10-CM

## 2023-11-12 DIAGNOSIS — M25551 Pain in right hip: Secondary | ICD-10-CM | POA: Diagnosis not present

## 2023-11-12 DIAGNOSIS — M47816 Spondylosis without myelopathy or radiculopathy, lumbar region: Secondary | ICD-10-CM

## 2023-11-12 DIAGNOSIS — M4716 Other spondylosis with myelopathy, lumbar region: Secondary | ICD-10-CM | POA: Diagnosis not present

## 2023-11-12 DIAGNOSIS — M1611 Unilateral primary osteoarthritis, right hip: Secondary | ICD-10-CM

## 2023-11-12 DIAGNOSIS — E785 Hyperlipidemia, unspecified: Secondary | ICD-10-CM

## 2023-11-15 DIAGNOSIS — M25551 Pain in right hip: Secondary | ICD-10-CM | POA: Diagnosis not present

## 2023-11-15 DIAGNOSIS — M4716 Other spondylosis with myelopathy, lumbar region: Secondary | ICD-10-CM | POA: Diagnosis not present

## 2023-11-18 ENCOUNTER — Other Ambulatory Visit: Payer: Self-pay | Admitting: Physician Assistant

## 2023-11-18 ENCOUNTER — Other Ambulatory Visit: Payer: Self-pay | Admitting: Internal Medicine

## 2023-11-18 DIAGNOSIS — N3946 Mixed incontinence: Secondary | ICD-10-CM

## 2023-11-18 DIAGNOSIS — N3281 Overactive bladder: Secondary | ICD-10-CM

## 2023-11-19 DIAGNOSIS — N3941 Urge incontinence: Secondary | ICD-10-CM | POA: Diagnosis not present

## 2023-11-19 NOTE — Telephone Encounter (Signed)
 Please schedule appointment for overdue follow up

## 2023-11-20 DIAGNOSIS — M4716 Other spondylosis with myelopathy, lumbar region: Secondary | ICD-10-CM | POA: Diagnosis not present

## 2023-11-20 DIAGNOSIS — M25551 Pain in right hip: Secondary | ICD-10-CM | POA: Diagnosis not present

## 2023-11-20 NOTE — Telephone Encounter (Signed)
 LVM to schedule fu, please schedule

## 2023-11-22 DIAGNOSIS — M25551 Pain in right hip: Secondary | ICD-10-CM | POA: Diagnosis not present

## 2023-11-22 DIAGNOSIS — M4716 Other spondylosis with myelopathy, lumbar region: Secondary | ICD-10-CM | POA: Diagnosis not present

## 2023-11-22 DIAGNOSIS — N3941 Urge incontinence: Secondary | ICD-10-CM | POA: Diagnosis not present

## 2023-11-23 ENCOUNTER — Ambulatory Visit
Admission: RE | Admit: 2023-11-23 | Discharge: 2023-11-23 | Disposition: A | Discharge status: Home / Self Care | Payer: Medicare Other | Source: Ambulatory Visit | Attending: Internal Medicine | Admitting: Internal Medicine

## 2023-11-23 DIAGNOSIS — Z1231 Encounter for screening mammogram for malignant neoplasm of breast: Secondary | ICD-10-CM | POA: Insufficient documentation

## 2023-11-27 ENCOUNTER — Encounter: Payer: Self-pay | Admitting: Internal Medicine

## 2023-11-27 ENCOUNTER — Other Ambulatory Visit: Payer: Self-pay | Admitting: Internal Medicine

## 2023-11-27 DIAGNOSIS — M4716 Other spondylosis with myelopathy, lumbar region: Secondary | ICD-10-CM | POA: Diagnosis not present

## 2023-11-27 DIAGNOSIS — R928 Other abnormal and inconclusive findings on diagnostic imaging of breast: Secondary | ICD-10-CM

## 2023-11-27 DIAGNOSIS — M25551 Pain in right hip: Secondary | ICD-10-CM | POA: Diagnosis not present

## 2023-11-27 DIAGNOSIS — N6001 Solitary cyst of right breast: Secondary | ICD-10-CM

## 2023-11-30 DIAGNOSIS — M25551 Pain in right hip: Secondary | ICD-10-CM | POA: Diagnosis not present

## 2023-11-30 DIAGNOSIS — M4716 Other spondylosis with myelopathy, lumbar region: Secondary | ICD-10-CM | POA: Diagnosis not present

## 2023-12-03 DIAGNOSIS — M4716 Other spondylosis with myelopathy, lumbar region: Secondary | ICD-10-CM | POA: Diagnosis not present

## 2023-12-03 DIAGNOSIS — M25551 Pain in right hip: Secondary | ICD-10-CM | POA: Diagnosis not present

## 2023-12-04 ENCOUNTER — Ambulatory Visit
Admission: RE | Admit: 2023-12-04 | Discharge: 2023-12-04 | Disposition: A | Discharge status: Home / Self Care | Source: Ambulatory Visit | Attending: Internal Medicine | Admitting: Internal Medicine

## 2023-12-04 ENCOUNTER — Ambulatory Visit
Admission: RE | Admit: 2023-12-04 | Discharge: 2023-12-04 | Discharge status: Home / Self Care | Source: Ambulatory Visit | Attending: Internal Medicine | Admitting: Internal Medicine

## 2023-12-04 DIAGNOSIS — R928 Other abnormal and inconclusive findings on diagnostic imaging of breast: Secondary | ICD-10-CM

## 2023-12-04 DIAGNOSIS — N6310 Unspecified lump in the right breast, unspecified quadrant: Secondary | ICD-10-CM | POA: Diagnosis not present

## 2023-12-04 DIAGNOSIS — R92321 Mammographic fibroglandular density, right breast: Secondary | ICD-10-CM | POA: Diagnosis not present

## 2023-12-06 DIAGNOSIS — M25551 Pain in right hip: Secondary | ICD-10-CM | POA: Diagnosis not present

## 2023-12-06 DIAGNOSIS — M4716 Other spondylosis with myelopathy, lumbar region: Secondary | ICD-10-CM | POA: Diagnosis not present

## 2023-12-07 ENCOUNTER — Other Ambulatory Visit: Payer: Self-pay | Admitting: Pulmonary Disease

## 2023-12-10 DIAGNOSIS — M25551 Pain in right hip: Secondary | ICD-10-CM | POA: Diagnosis not present

## 2023-12-10 DIAGNOSIS — M4716 Other spondylosis with myelopathy, lumbar region: Secondary | ICD-10-CM | POA: Diagnosis not present

## 2023-12-11 DIAGNOSIS — G8918 Other acute postprocedural pain: Secondary | ICD-10-CM | POA: Diagnosis not present

## 2023-12-11 DIAGNOSIS — M47816 Spondylosis without myelopathy or radiculopathy, lumbar region: Secondary | ICD-10-CM | POA: Diagnosis not present

## 2023-12-11 DIAGNOSIS — M5416 Radiculopathy, lumbar region: Secondary | ICD-10-CM | POA: Diagnosis not present

## 2023-12-11 DIAGNOSIS — N189 Chronic kidney disease, unspecified: Secondary | ICD-10-CM | POA: Diagnosis not present

## 2023-12-11 DIAGNOSIS — G629 Polyneuropathy, unspecified: Secondary | ICD-10-CM | POA: Diagnosis not present

## 2023-12-11 DIAGNOSIS — I5032 Chronic diastolic (congestive) heart failure: Secondary | ICD-10-CM | POA: Diagnosis not present

## 2023-12-11 DIAGNOSIS — C569 Malignant neoplasm of unspecified ovary: Secondary | ICD-10-CM | POA: Diagnosis not present

## 2023-12-12 DIAGNOSIS — M4716 Other spondylosis with myelopathy, lumbar region: Secondary | ICD-10-CM | POA: Diagnosis not present

## 2023-12-12 DIAGNOSIS — M25551 Pain in right hip: Secondary | ICD-10-CM | POA: Diagnosis not present

## 2023-12-17 DIAGNOSIS — M25551 Pain in right hip: Secondary | ICD-10-CM | POA: Diagnosis not present

## 2023-12-17 DIAGNOSIS — M4716 Other spondylosis with myelopathy, lumbar region: Secondary | ICD-10-CM | POA: Diagnosis not present

## 2023-12-19 ENCOUNTER — Other Ambulatory Visit: Payer: Self-pay | Admitting: Physician Assistant

## 2023-12-19 ENCOUNTER — Other Ambulatory Visit: Payer: Self-pay | Admitting: Internal Medicine

## 2023-12-19 DIAGNOSIS — N3281 Overactive bladder: Secondary | ICD-10-CM

## 2023-12-19 DIAGNOSIS — E039 Hypothyroidism, unspecified: Secondary | ICD-10-CM

## 2023-12-19 DIAGNOSIS — N3946 Mixed incontinence: Secondary | ICD-10-CM

## 2023-12-20 ENCOUNTER — Encounter: Payer: Self-pay | Admitting: Internal Medicine

## 2023-12-20 ENCOUNTER — Ambulatory Visit: Admitting: Internal Medicine

## 2023-12-20 VITALS — BP 128/64 | HR 76 | Ht 66.0 in | Wt 183.8 lb

## 2023-12-20 DIAGNOSIS — E039 Hypothyroidism, unspecified: Secondary | ICD-10-CM | POA: Diagnosis not present

## 2023-12-20 DIAGNOSIS — N3281 Overactive bladder: Secondary | ICD-10-CM | POA: Diagnosis not present

## 2023-12-20 DIAGNOSIS — D62 Acute posthemorrhagic anemia: Secondary | ICD-10-CM

## 2023-12-20 DIAGNOSIS — N182 Chronic kidney disease, stage 2 (mild): Secondary | ICD-10-CM | POA: Diagnosis not present

## 2023-12-20 DIAGNOSIS — E782 Mixed hyperlipidemia: Secondary | ICD-10-CM | POA: Diagnosis not present

## 2023-12-20 DIAGNOSIS — Z01818 Encounter for other preprocedural examination: Secondary | ICD-10-CM

## 2023-12-20 DIAGNOSIS — R944 Abnormal results of kidney function studies: Secondary | ICD-10-CM

## 2023-12-20 DIAGNOSIS — M1611 Unilateral primary osteoarthritis, right hip: Secondary | ICD-10-CM

## 2023-12-20 LAB — COMPREHENSIVE METABOLIC PANEL WITH GFR
ALT: 22 U/L (ref 0–35)
AST: 23 U/L (ref 0–37)
Albumin: 4.2 g/dL (ref 3.5–5.2)
Alkaline Phosphatase: 68 U/L (ref 39–117)
BUN: 24 mg/dL — ABNORMAL HIGH (ref 6–23)
CO2: 27 meq/L (ref 19–32)
Calcium: 9.3 mg/dL (ref 8.4–10.5)
Chloride: 106 meq/L (ref 96–112)
Creatinine, Ser: 0.69 mg/dL (ref 0.40–1.20)
GFR: 80.18 mL/min (ref >60.00)
Glucose, Bld: 89 mg/dL (ref 70–99)
Potassium: 4.4 meq/L (ref 3.5–5.1)
Sodium: 140 meq/L (ref 135–145)
Total Bilirubin: 0.5 mg/dL (ref 0.2–1.2)
Total Protein: 6.7 g/dL (ref 6.0–8.3)

## 2023-12-20 LAB — TSH: TSH: 4.79 u[IU]/mL (ref 0.35–5.50)

## 2023-12-20 LAB — LIPID PANEL
Cholesterol: 198 mg/dL (ref 0–200)
HDL: 64.5 mg/dL (ref >39.00)
LDL Cholesterol: 115 mg/dL — ABNORMAL HIGH (ref 0–99)
NonHDL: 133.63
Total CHOL/HDL Ratio: 3
Triglycerides: 91 mg/dL (ref 0.0–149.0)
VLDL: 18.2 mg/dL (ref 0.0–40.0)

## 2023-12-20 LAB — LDL CHOLESTEROL, DIRECT: Direct LDL: 124 mg/dL

## 2023-12-20 LAB — MICROALBUMIN / CREATININE URINE RATIO
Creatinine,U: 73.1 mg/dL
Microalb Creat Ratio: UNDETERMINED mg/g (ref 0.0–30.0)
Microalb, Ur: <0.7 mg/dL

## 2023-12-20 MED ORDER — PANTOPRAZOLE SODIUM 40 MG PO TBEC: 40.0000 mg | DELAYED_RELEASE_TABLET | Freq: Every day | ORAL | 1 refills | Status: DC

## 2023-12-20 NOTE — Assessment & Plan Note (Signed)
 For left hip replacement May 2025

## 2023-12-20 NOTE — Progress Notes (Signed)
 Subjective:  Patient ID: Melissa Anderson, female    DOB: 12/23/1940  Age: 83 y.o. MRN: [id number]  CC: The primary encounter diagnosis was Pre-op examination. Diagnoses of Decreased GFR, Acquired hypothyroidism, Moderate mixed hyperlipidemia not requiring statin therapy, Postoperative anemia due to acute blood loss, Overactive bladder, Primary osteoarthritis of right hip, and Chronic kidney disease (CKD) stage G2/A2, mildly decreased glomerular filtration rate (GFR) between 60-89 mL/min/1.73 square meter and albuminuria creatinine ratio between 30-299 mg/g were also pertinent to this visit.   HPI Melissa Anderson presents for  Chief Complaint  Patient presents with   Medical Management of Chronic Issues    Follow up to discuss upcoming hip surgery    Patient is preparing to have an elective left hip replacement  in June and is scheduled to see cardiology and pulmonology next week  She stopped the rosuvastatin  .   No difference in pain until she started using SIGNAL RELIEF patch (ordered online ,  ingredients unclear but per patient not containing lidocaine  patch) .  Reviewed films, lipids,  ZERO CAC score  , Kidney function reviewed.  GFR has been 60 ml/min give or take 5 ml for the last 2 years.  Not using NSAIDS.  Urge incontinence :  planning to get a bladder stimulator in the Fall.          Outpatient Medications Prior to Visit  Medication Sig Dispense Refill   aspirin EC 81 MG tablet Take 81 mg by mouth daily. Swallow whole.     cyanocobalamin  (VITAMIN B12) 500 MCG tablet Take 500 mcg by mouth daily.     finasteride (PROSCAR) 5 MG tablet Take 5 mg by mouth daily.     furosemide  (LASIX ) 20 MG tablet Take 1 tablet (20 mg total) by mouth daily. 90 tablet 3   SYNTHROID  150 MCG tablet TAKE 1 TABLET (150 MCG TOTAL) BY MOUTH DAILY BEFORE BREAKFAST. 30 MIN BEFORE FOOD. ON SUNDAYS TAKE 1.5 TABLETS STOP 175 MCG 96 tablet 5   TRELEGY ELLIPTA  100-62.5-25 MCG/ACT AEPB INHALE 1 PUFF BY  MOUTH EVERY DAY 60 each 11   Vibegron  (GEMTESA ) 75 MG TABS Take 1 tablet (75 mg total) by mouth daily. 90 tablet 3   Vitamin D , Ergocalciferol , (DRISDOL ) 1.25 MG (50000 UNIT) CAPS capsule TAKE 1 CAPSULE (50,000 UNITS TOTAL) BY MOUTH ONCE A WEEK. D3 12 capsule 1   pantoprazole  (PROTONIX ) 40 MG tablet TAKE 1 TABLET BY MOUTH EVERY DAY 90 tablet 1   rosuvastatin  (CRESTOR ) 5 MG tablet Take 1 tablet (5 mg total) by mouth daily. 90 tablet 3   No facility-administered medications prior to visit.    Review of Systems;  Patient denies headache, fevers, malaise, unintentional weight loss, skin rash, eye pain, sinus congestion and sinus pain, sore throat, dysphagia,  hemoptysis , cough, dyspnea, wheezing, chest pain, palpitations, orthopnea, edema, abdominal pain, nausea, melena, diarrhea, constipation, flank pain, dysuria, hematuria, urinary  Frequency, nocturia, numbness, tingling, seizures,  Focal weakness, Loss of consciousness,  Tremor, insomnia, depression, anxiety, and suicidal ideation.      Objective:  BP 128/64   Pulse 76   Ht 5\' 6"  (1.676 m)   Wt 183 lb 12.8 oz (83.4 kg)   SpO2 97%   BMI 29.67 kg/m   BP Readings from Last 3 Encounters:  12/20/23 128/64  10/03/23 132/60  08/06/23 118/60    Wt Readings from Last 3 Encounters:  12/20/23 183 lb 12.8 oz (83.4 kg)  10/03/23 195 lb (88.5 kg)  08/06/23  194 lb 12.8 oz (88.4 kg)    Physical Exam Vitals reviewed.  Constitutional:      General: She is not in acute distress.    Appearance: Normal appearance. She is normal weight. She is not ill-appearing, toxic-appearing or diaphoretic.  HENT:     Head: Normocephalic.  Eyes:     General: No scleral icterus.       Right eye: No discharge.        Left eye: No discharge.     Conjunctiva/sclera: Conjunctivae normal.  Cardiovascular:     Rate and Rhythm: Normal rate and regular rhythm.     Heart sounds: Normal heart sounds.  Pulmonary:     Effort: Pulmonary effort is normal. No  respiratory distress.     Breath sounds: Normal breath sounds.  Musculoskeletal:        General: Normal range of motion.  Skin:    General: Skin is warm and dry.  Neurological:     General: No focal deficit present.     Mental Status: She is alert and oriented to person, place, and time. Mental status is at baseline.  Psychiatric:        Mood and Affect: Mood normal.        Behavior: Behavior normal.        Thought Content: Thought content normal.        Judgment: Judgment normal.    Lab Results  Component Value Date   HGBA1C 5.3 05/09/2018    Lab Results  Component Value Date   CREATININE 0.69 12/20/2023   CREATININE 0.88 08/06/2023   CREATININE 0.73 02/20/2023    Lab Results  Component Value Date   WBC 7.8 08/06/2023   HGB 12.1 08/06/2023   HCT 37.2 08/06/2023   PLT 335.0 08/06/2023   GLUCOSE 89 12/20/2023   CHOL 198 12/20/2023   TRIG 91.0 12/20/2023   HDL 64.50 12/20/2023   LDLDIRECT 124.0 12/20/2023   LDLCALC 115 (H) 12/20/2023   ALT 22 12/20/2023   AST 23 12/20/2023   NA 140 12/20/2023   K 4.4 12/20/2023   CL 106 12/20/2023   CREATININE 0.69 12/20/2023   BUN 24 (H) 12/20/2023   CO2 27 12/20/2023   TSH 4.79 12/20/2023   HGBA1C 5.3 05/09/2018   MICROALBUR <0.7 12/20/2023    MM 3D DIAGNOSTIC MAMMOGRAM UNILATERAL RIGHT BREAST Result Date: 12/04/2023 CLINICAL DATA:  Recall for possible right breast asymmetry. EXAM: DIGITAL DIAGNOSTIC UNILATERAL RIGHT MAMMOGRAM WITH TOMOSYNTHESIS AND CAD; ULTRASOUND RIGHT BREAST LIMITED TECHNIQUE: Right digital diagnostic mammography and breast tomosynthesis was performed. The images were evaluated with computer-aided detection. ; Targeted ultrasound examination of the right breast was performed COMPARISON:  Previous exam(s). ACR Breast Density Category b: There are scattered areas of fibroglandular density. FINDINGS: Right: Mammogram: The suspected asymmetry in the outer right breast, seen only on the CC view does not persist on  additional spot compression views. The oval mass seen in the outer upper breast on both CC and MLO views of the prior examination does persist on additional spot compression images. Ultrasound: Targeted sonographic evaluation of the right breast demonstrates a mildly complex cluster of cysts measuring 10 x 4 x 7 mm at 9:30 3 CMFN. This corresponds to the mammographic abnormality. IMPRESSION: Probably benign mildly complex cluster of cysts measuring 10 mm (09:30 3 CMFN) RECOMMENDATION: Diagnostic right mammogram with ultrasound in 6 months. We discussed management options for the likely benign mass including ultrasound-guided core biopsy and short term interval follow-up. Follow-up mammogram  and ultrasound is recommended at 6, 12 and 24 months to assess stability. The patient agrees with this plan. If applicable, a reminder letter will be sent to the patient regarding the next appointment. BI-RADS CATEGORY  3: Probably benign. Electronically Signed   By: Elester Grim M.D.   On: 12/04/2023 09:08   US  LIMITED ULTRASOUND INCLUDING AXILLA RIGHT BREAST Result Date: 12/04/2023 CLINICAL DATA:  Recall for possible right breast asymmetry. EXAM: DIGITAL DIAGNOSTIC UNILATERAL RIGHT MAMMOGRAM WITH TOMOSYNTHESIS AND CAD; ULTRASOUND RIGHT BREAST LIMITED TECHNIQUE: Right digital diagnostic mammography and breast tomosynthesis was performed. The images were evaluated with computer-aided detection. ; Targeted ultrasound examination of the right breast was performed COMPARISON:  Previous exam(s). ACR Breast Density Category b: There are scattered areas of fibroglandular density. FINDINGS: Right: Mammogram: The suspected asymmetry in the outer right breast, seen only on the CC view does not persist on additional spot compression views. The oval mass seen in the outer upper breast on both CC and MLO views of the prior examination does persist on additional spot compression images. Ultrasound: Targeted sonographic evaluation of the  right breast demonstrates a mildly complex cluster of cysts measuring 10 x 4 x 7 mm at 9:30 3 CMFN. This corresponds to the mammographic abnormality. IMPRESSION: Probably benign mildly complex cluster of cysts measuring 10 mm (09:30 3 CMFN) RECOMMENDATION: Diagnostic right mammogram with ultrasound in 6 months. We discussed management options for the likely benign mass including ultrasound-guided core biopsy and short term interval follow-up. Follow-up mammogram and ultrasound is recommended at 6, 12 and 24 months to assess stability. The patient agrees with this plan. If applicable, a reminder letter will be sent to the patient regarding the next appointment. BI-RADS CATEGORY  3: Probably benign. Electronically Signed   By: Elester Grim M.D.   On: 12/04/2023 09:08    Assessment & Plan:  .Pre-op examination  Decreased GFR -     Microalbumin / creatinine urine ratio -     Comprehensive metabolic panel with GFR  Acquired hypothyroidism Assessment & Plan: Thyroid  function is WNL on current dose of 150 mcg Synthroid  daily. No current changes needed.    Lab Results  Component Value Date   TSH 4.79 12/20/2023     Orders: -     TSH  Moderate mixed hyperlipidemia not requiring statin therapy Assessment & Plan: Untreated due to statin myalgias.  She has aortic atherosclerosis by CT scan but her CAC score is 0.  Agree with suspension of statin   Orders: -     Lipid panel -     LDL cholesterol, direct  Postoperative anemia due to acute blood loss Assessment & Plan:  A Drop of one point was noted after her last surger   rechecking today    Overactive bladder Assessment & Plan: Managed with Gemtesa  by Urology . S/p PTNS Jan 2024 . Bladder stimulator planned in the fall    Primary osteoarthritis of right hip Assessment & Plan: For left hip replacement May 2025   Chronic kidney disease (CKD) stage G2/A2, mildly decreased glomerular filtration rate (GFR) between 60-89 mL/min/1.73 square  meter and albuminuria creatinine ratio between 30-299 mg/g  Other orders -     Pantoprazole  Sodium; Take 1 tablet (40 mg total) by mouth daily.  Dispense: 90 tablet; Refill: 1     I spent 34 minutes on the day of this face to face encounter reviewing patient's  most recent visit with cardiology,  pulmonology, urology, and orthopedics , prior relevant  surgical and non surgical procedures, recent  labs and imaging studies,   reviewing the assessment and plan with patient, and post visit ordering and reviewing of  diagnostics and therapeutics with patient  .   Follow-up: Return in about 6 months (around 06/20/2024).   Thersia Flax, MD

## 2023-12-20 NOTE — Assessment & Plan Note (Signed)
 A Drop of one point was noted after her last surger   rechecking today

## 2023-12-20 NOTE — Assessment & Plan Note (Signed)
 Thyroid  function is WNL on current dose of 150 mcg Synthroid  daily. No current changes needed.    Lab Results  Component Value Date   TSH 4.79 12/20/2023

## 2023-12-20 NOTE — Assessment & Plan Note (Signed)
 Checking microalb/cr  ratio todyay.  No history of hypertension  Lab Results  Component Value Date   LABMICR See below: 11/08/2021   LABMICR See below: 05/10/2021   MICROALBUR <0.7 12/20/2023

## 2023-12-20 NOTE — Assessment & Plan Note (Signed)
 Untreated due to statin myalgias.  She has aortic atherosclerosis by CT scan but her CAC score is 0.  Agree with suspension of statin

## 2023-12-20 NOTE — Assessment & Plan Note (Signed)
 Managed with Gemtesa  by Urology . S/p PTNS Jan 2024 . Bladder stimulator planned in the fall

## 2023-12-21 DIAGNOSIS — M4716 Other spondylosis with myelopathy, lumbar region: Secondary | ICD-10-CM | POA: Diagnosis not present

## 2023-12-21 DIAGNOSIS — M25551 Pain in right hip: Secondary | ICD-10-CM | POA: Diagnosis not present

## 2023-12-23 ENCOUNTER — Encounter: Payer: Self-pay | Admitting: Internal Medicine

## 2023-12-24 DIAGNOSIS — H2513 Age-related nuclear cataract, bilateral: Secondary | ICD-10-CM | POA: Diagnosis not present

## 2023-12-26 ENCOUNTER — Ambulatory Visit: Attending: Internal Medicine | Admitting: Internal Medicine

## 2023-12-26 ENCOUNTER — Encounter: Payer: Self-pay | Admitting: Internal Medicine

## 2023-12-26 VITALS — BP 120/62 | HR 77 | Ht 66.0 in | Wt 188.0 lb

## 2023-12-26 DIAGNOSIS — M7989 Other specified soft tissue disorders: Secondary | ICD-10-CM | POA: Insufficient documentation

## 2023-12-26 DIAGNOSIS — E785 Hyperlipidemia, unspecified: Secondary | ICD-10-CM | POA: Diagnosis not present

## 2023-12-26 DIAGNOSIS — I700 Atherosclerosis of aorta: Secondary | ICD-10-CM | POA: Insufficient documentation

## 2023-12-26 DIAGNOSIS — Z01810 Encounter for preprocedural cardiovascular examination: Secondary | ICD-10-CM | POA: Diagnosis not present

## 2023-12-26 NOTE — Patient Instructions (Signed)
 Medication Instructions:  Your physician recommends that you continue on your current medications as directed. Please refer to the Current Medication list given to you today.   *If you need a refill on your cardiac medications before your next appointment, please call your pharmacy*  Lab Work: No labs ordered today   Testing/Procedures: No test ordered today   Follow-Up: At [facility], you and your health needs are our priority.  As part of our continuing mission to provide you with exceptional heart care, our providers are all part of one team.  This team includes your primary Cardiologist (physician) and Advanced Practice Providers or APPs (Physician Assistants and Nurse Practitioners) who all work together to provide you with the care you need, when you need it.  Your next appointment:   1 year(s)  Provider:   You may see Yvonne Kendall, MD or one of the following Advanced Practice Providers on your designated Care Team:   Nicolasa Ducking, NP Ames Dura, PA-C Eula Listen, PA-C Cadence Grayson, PA-C Charlsie Quest, NP Carlos Levering, NP    We recommend signing up for the patient portal called "MyChart".  Sign up information is provided on this After Visit Summary.  MyChart is used to connect with patients for Virtual Visits (Telemedicine).  Patients are able to view lab/test results, encounter notes, upcoming appointments, etc.  Non-urgent messages can be sent to your provider as well.   To learn more about what you can do with MyChart, go to [email or web address].

## 2023-12-26 NOTE — Progress Notes (Signed)
 Cardiology Office Note:  .   Date:  12/26/2023  ID:  Melissa Anderson, DOB Jul 18, 1941, MRN [id number] PCP: Thersia Flax, MD  [facility] Providers Cardiologist:  Sammy Crisp, MD     History of Present Illness: Melissa Anderson   Melissa Anderson is a 83 y.o. female with history of aortic atherosclerosis, hyperlipidemia, asthmatic bronchitis, ovarian cancer, gain Barr syndrome, lymphedema, and hypothyroidism, who presents for follow-up of shortness of breath.  I last saw her a year ago, which time she was feeling well without chest pain or significant dyspnea.  She was exercising 3 days a week in an effort to improve her stamina.  She noted that her activity was somewhat limited by right hip pain having recently been started on celecoxib .  Given aortic atherosclerosis on coronary CTA, we elected to add low-dose rosuvastatin  in an effort to bring her LDL below 100.  However, it appears that she has since stopped the rosuvastatin  due to joint pains.  Today, Melissa Anderson reports that she has been doing well from a heart standpoint.  Her biggest complaint is of pain in the left hip for which she is scheduled to undergo arthroplasty in June.  She has been applying a signal relief patch with some symptomatic improvement.  She denies chest pain, shortness of breath, palpitations, and lightheadedness.  Her chronic lymphedema remains problematic despite using her lymphedema pumps at home.  She will be starting more intense lymphedema therapy next month leading up to her hip replacement.  ROS: See HPI  Studies Reviewed: Melissa Anderson   EKG Interpretation Date/Time:  Wednesday December 26 2023 11:11:59 EDT Ventricular Rate:  77 PR Interval:  192 QRS Duration:  98 QT Interval:  396 QTC Calculation: 448 R Axis:   26  Text Interpretation: Normal sinus rhythm Normal ECG When compared with ECG of 20-Dec-1012 No significant change was found Confirmed by Steffi Noviello, Veryl Gottron [phone]) on 12/26/2023 2:15:13 PM    Coronary CTA  (03/13/2022): Normal coronary arteries without stenosis or calcification.  No acute cardiopulmonary abnormalities.  Chronic atelectasis of the right middle lobe as well as bilateral lower lobe scarring.  TTE (10/31/2021): Normal LV size and wall thickness.  LVEF 60-65% with normal diastolic function.  Normal RV size and function.  Normal biatrial size.  Mild mitral regurgitation.  Trivial tricuspid regurgitation.  Elevated CVP (~15 mmHg).  Risk Assessment/Calculations:             Physical Exam:   VS:  BP 120/62 (BP Location: Left Arm, Patient Position: Sitting, Cuff Size: Normal)   Pulse 77   Ht 5\' 6"  (1.676 m)   Wt 188 lb (85.3 kg)   SpO2 96%   BMI 30.34 kg/m    Wt Readings from Last 3 Encounters:  12/26/23 188 lb (85.3 kg)  12/20/23 183 lb 12.8 oz (83.4 kg)  10/03/23 195 lb (88.5 kg)    General:  NAD. Neck: No JVD or HJR. Lungs: Clear to auscultation bilaterally without wheezes or crackles. Heart: Regular rate and rhythm without murmurs, rubs, or gallops. Abdomen: Soft, nontender, nondistended. Extremities: 2+ chronic bilateral calf edema.  ASSESSMENT AND PLAN: .    Preoperative cardiovascular risk assessment: Melissa Anderson does not have any symptoms of unstable cardiovascular disease.  While she has a hard time performing 4 METS of activity due to hip pain, her coronary CTA in 2023 showed no significant CAD.  Her EKG and exam today are normal other than chronic lower extremity edema driven primarily by lymphedema.  I think it is reasonable for Ms. Llorens to move forward with hip surgery, which is low risk from a cardiovascular standpoint, without additional cardiac testing or intervention.  Aspirin can be held at the discretion of her orthopedist.  Aortic atherosclerosis and hyperlipidemia: Mild plaquing in the aorta noted on prior coronary CTA.  Unfortunately, Melissa Anderson has been intolerant of both atorvastatin and rosuvastatin .  Her LDL was only mildly elevated on last check  at 115.  We have discussed further management options and have agreed to work on lifestyle modifications rather than challenging her with alternative pharmacotherapy.  Leg edema: This is driven primarily by lymphedema.  I encouraged Melissa Anderson to continue using her lymphedema pumps.  She can also continue her low-dose furosemide .    Dispo: Return to clinic in 1 year.  Signed, Sammy Crisp, MD

## 2023-12-28 DIAGNOSIS — M4716 Other spondylosis with myelopathy, lumbar region: Secondary | ICD-10-CM | POA: Diagnosis not present

## 2023-12-28 DIAGNOSIS — M25551 Pain in right hip: Secondary | ICD-10-CM | POA: Diagnosis not present

## 2024-01-02 DIAGNOSIS — I890 Lymphedema, not elsewhere classified: Secondary | ICD-10-CM | POA: Diagnosis not present

## 2024-01-03 DIAGNOSIS — M4716 Other spondylosis with myelopathy, lumbar region: Secondary | ICD-10-CM | POA: Diagnosis not present

## 2024-01-03 DIAGNOSIS — M25551 Pain in right hip: Secondary | ICD-10-CM | POA: Diagnosis not present

## 2024-01-04 ENCOUNTER — Other Ambulatory Visit: Payer: Self-pay | Admitting: Physician Assistant

## 2024-01-04 DIAGNOSIS — N3281 Overactive bladder: Secondary | ICD-10-CM

## 2024-01-04 DIAGNOSIS — N3946 Mixed incontinence: Secondary | ICD-10-CM

## 2024-01-07 ENCOUNTER — Other Ambulatory Visit: Payer: Self-pay | Admitting: Physician Assistant

## 2024-01-07 DIAGNOSIS — N3281 Overactive bladder: Secondary | ICD-10-CM

## 2024-01-07 DIAGNOSIS — N3946 Mixed incontinence: Secondary | ICD-10-CM

## 2024-01-08 DIAGNOSIS — M4716 Other spondylosis with myelopathy, lumbar region: Secondary | ICD-10-CM | POA: Diagnosis not present

## 2024-01-08 DIAGNOSIS — M25551 Pain in right hip: Secondary | ICD-10-CM | POA: Diagnosis not present

## 2024-01-09 ENCOUNTER — Telehealth: Payer: Self-pay

## 2024-01-09 ENCOUNTER — Ambulatory Visit: Admitting: Pulmonary Disease

## 2024-01-09 ENCOUNTER — Other Ambulatory Visit: Payer: Self-pay

## 2024-01-09 DIAGNOSIS — N3281 Overactive bladder: Secondary | ICD-10-CM

## 2024-01-09 DIAGNOSIS — N3946 Mixed incontinence: Secondary | ICD-10-CM

## 2024-01-09 DIAGNOSIS — I890 Lymphedema, not elsewhere classified: Secondary | ICD-10-CM | POA: Diagnosis not present

## 2024-01-09 NOTE — Telephone Encounter (Signed)
 Patient left a message on triage line stating that she is going to have surgery coming up and not able to come in for follow up and would like to know if she can get 30 day supply for now of Gemtesa  and then make an appointment once she recovers?

## 2024-01-09 NOTE — Telephone Encounter (Signed)
 Pt calls triage line and states that she would like a 30 day supply of Gemtesa . Advised pt that we need to see her for an office visit as she has not been seen in over a year. Patient states she she is unable to schedule an office visit at this time as she is preparing for surgery and is unsure when she will be released to drive post operatively. Advised pt that unfortunately we are unable to continue to prescribe medication. She will call back when she is available to be seen.

## 2024-01-10 ENCOUNTER — Ambulatory Visit (INDEPENDENT_AMBULATORY_CARE_PROVIDER_SITE_OTHER): Admitting: Pulmonary Disease

## 2024-01-10 ENCOUNTER — Encounter: Payer: Self-pay | Admitting: Pulmonary Disease

## 2024-01-10 VITALS — BP 118/76 | HR 74 | Temp 98.3°F | Ht 66.0 in | Wt 188.6 lb

## 2024-01-10 DIAGNOSIS — K449 Diaphragmatic hernia without obstruction or gangrene: Secondary | ICD-10-CM | POA: Diagnosis not present

## 2024-01-10 DIAGNOSIS — Z01811 Encounter for preprocedural respiratory examination: Secondary | ICD-10-CM | POA: Diagnosis not present

## 2024-01-10 DIAGNOSIS — K219 Gastro-esophageal reflux disease without esophagitis: Secondary | ICD-10-CM | POA: Diagnosis not present

## 2024-01-10 DIAGNOSIS — I5032 Chronic diastolic (congestive) heart failure: Secondary | ICD-10-CM | POA: Diagnosis not present

## 2024-01-10 DIAGNOSIS — Z87891 Personal history of nicotine dependence: Secondary | ICD-10-CM | POA: Diagnosis not present

## 2024-01-10 DIAGNOSIS — J4489 Other specified chronic obstructive pulmonary disease: Secondary | ICD-10-CM | POA: Diagnosis not present

## 2024-01-10 MED ORDER — TRELEGY ELLIPTA 100-62.5-25 MCG/ACT IN AEPB: 1.0000 | INHALATION_SPRAY | Freq: Every day | RESPIRATORY_TRACT | 0 refills | Status: AC

## 2024-01-10 MED ORDER — GEMTESA 75 MG PO TABS
75.0000 mg | ORAL_TABLET | Freq: Every day | ORAL | 0 refills | Status: AC
Start: 2024-01-10 — End: ?

## 2024-01-10 NOTE — Addendum Note (Signed)
 Addended by: Takiyah Bohnsack P on: 01/10/2024 11:44 AM   Modules accepted: Orders

## 2024-01-10 NOTE — Progress Notes (Signed)
 Subjective:    Patient ID: Melissa Anderson, female    DOB: [birth year], 83 y.o.   MRN: [id number]  Patient Care Team: Thersia Flax, MD as PCP - General (Internal Medicine) End, Veryl Gottron, MD as PCP - Cardiology (Cardiology) Nobie Batch, MD as Referring Physician (Obstetrics and Gynecology) Avonne Boettcher, MD as Consulting Physician (Oncology) Rochell Chroman, RN as Oncology Nurse Navigator Marc Senior, MD as Consulting Physician (Pulmonary Disease)  Chief Complaint  Patient presents with   Follow-up    Following up: Asthma, surgery compliance.  Still waking up SOB and using her trelegy. Dry cough with post nasal drip. No mucus. Looking to get another sample of the Trelegy inhaler in case the pharmacy doesn't have it in stock. Has hip surgery 01/28/24 scheduled.    BACKGROUND/INTERVAL:Melissa Anderson is an 83 year old former smoker (23 PY) with a history as noted below, who presents for follow-up on the issue of asthmatic bronchitis and shortness of breath.  He has also hiatal hernia with GERD and significant reflux issues due to this.  She was last seen here on 11 June 2023.  In the interim she has had hip replacement surgery.  HPI Discussed the use of AI scribe software for clinical note transcription with the patient, who gave verbal consent to proceed.  History of Present Illness   Melissa Anderson is an 83 year old female who presents with shortness of breath.  She has been experiencing issues with obtaining her medication, Trelegy, due to pharmacy supply problems, resulting in a 15-day delay in receiving her prescription. She managed to use a sample provided previously to avoid running out. She describes the situation as frustrating, noting that the medication is often out of stock.  She has an rescue inhaler but never uses it.  She has a history of hip surgery performed at [facility], after which she temporarily stayed in assisted living due to living  alone. Post-surgery, she experienced a temporary resolution of edema and incontinence for about ten days, which later returned.  She discusses severe incontinence issues, for which she has been evaluated. She mentions two potential treatments: a medication similar to Viagra or a surgical procedure. She is concerned about the medication option due to potential side effects and is considering the surgical option, which has a longer duration of effect.  All of these surgeries will occur at [facility].    Review of Systems A 10 point review of systems was performed and it is as noted above otherwise negative.   Patient Active Problem List   Diagnosis Date Noted   Chronic kidney disease (CKD) stage G2/A2, mildly decreased glomerular filtration rate (GFR) between 60-89 mL/min/1.73 square meter and albuminuria creatinine ratio between 30-299 mg/g 12/20/2023   Postoperative anemia due to acute blood loss 08/06/2023   Hospital discharge follow-up 08/06/2023   Decreased calculated glomerular filtration rate (GFR) 01/05/2023   Obesity (BMI 30.0-34.9) 01/04/2023   Leg swelling 12/21/2022   Myalgia due to statin 12/14/2022   Hiatal hernia 12/14/2022   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/14/2022   Dyspnea on exertion 12/31/2021   Chronic diastolic heart failure (HCC) 12/31/2021   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   Lymphedema 09/28/2020   Abnormal gait 09/28/2020   Arthritis, 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   Aortic atherosclerosis (HCC) 07/14/2020   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   History of ovarian cancer 02/11/2020   Acute pain of right shoulder 08/15/2018   Guillain-Barre syndrome (  HCC) 06/17/2018   Vitamin D  deficiency 05/08/2018   Neuropathy 05/08/2018   Overactive bladder 05/08/2018   Bilateral leg edema 05/08/2018   Hyperlipidemia LDL goal <100 05/08/2018   Goals of care, counseling/discussion 02/25/2018   Ovarian cancer (HCC)  02/22/2018   Graves' disease 02/13/2018    Social History   Tobacco Use   Smoking status: Former    Current packs/day: 0.00    Average packs/day: 1 pack/day for 23.0 years (23.0 ttl pk-yrs)    Types: Cigarettes    Start date: 1964    Quit date: 59    Years since quitting: 38.4   Smokeless tobacco: Never  Substance Use Topics   Alcohol use: Never    Allergies  Allergen Reactions   Novocain [Procaine] Palpitations   Other Other (See Comments)    Supposedly allergic to Shingles and Pneumonia vaccine.   Celebrex  [Celecoxib ] Other (See Comments)    Decreased gfr   Crestor  [Rosuvastatin ]     Myopathy     Influenza Vaccines     ?GBS 05/2018    Lipitor [Atorvastatin Calcium ]     Myalgias      Current Meds  Medication Sig   aspirin EC 81 MG tablet Take 81 mg by mouth daily. Swallow whole.   cyanocobalamin  (VITAMIN B12) 500 MCG tablet Take 500 mcg by mouth daily.   finasteride (PROSCAR) 5 MG tablet Take 5 mg by mouth daily.   Fluticasone-Umeclidin-Vilant (TRELEGY ELLIPTA ) 100-62.5-25 MCG/ACT AEPB Inhale 1 puff into the lungs daily.   furosemide  (LASIX ) 20 MG tablet Take 1 tablet (20 mg total) by mouth daily.   pantoprazole  (PROTONIX ) 40 MG tablet Take 1 tablet (40 mg total) by mouth daily.   SYNTHROID  150 MCG tablet TAKE 1 TABLET (150 MCG TOTAL) BY MOUTH DAILY BEFORE BREAKFAST. 30 MIN BEFORE FOOD. ON SUNDAYS TAKE 1.5 TABLETS STOP 175 MCG   TRELEGY ELLIPTA  100-62.5-25 MCG/ACT AEPB INHALE 1 PUFF BY MOUTH EVERY DAY   Vibegron  (GEMTESA ) 75 MG TABS Take 1 tablet (75 mg total) by mouth daily.    Immunization History  Administered Date(s) Administered   Influenza, High Dose Seasonal PF 05/31/2018   Influenza-Unspecified 05/13/2018   PFIZER Comirnaty(Gray Top)Covid-19 Tri-Sucrose Vaccine 11/15/2020   PFIZER(Purple Top)SARS-COV-2 Vaccination 02/09/2020, 03/02/2020        Objective:     BP 118/76 (BP Location: Right Arm, Patient Position: Sitting, Cuff Size: Normal)    Pulse 74   Temp 98.3 F (36.8 C) (Oral)   Ht 5\' 6"  (1.676 m)   Wt 188 lb 9.6 oz (85.5 kg)   SpO2 94%   BMI 30.44 kg/m   SpO2: 94 %  GENERAL: Well-developed, well-nourished, looks younger than stated age.  Ambulatory with assistance of a walker.  No conversational dyspnea. HEAD: Normocephalic, atraumatic.  EYES: Pupils equal, round, reactive to light.  No scleral icterus.  MOUTH: Oral mucosa moist.  No thrush. NECK: Supple. No thyromegaly. Trachea midline. No JVD.  No adenopathy. PULMONARY: Good air entry bilaterally.  No adventitious sounds. CARDIOVASCULAR: S1 and S2. Regular rate and rhythm.  No rubs, murmurs or gallops heard. ABDOMEN: Benign. MUSCULOSKELETAL: No joint deformity, no clubbing, 2+ to +3 edema .  Prominent varicose veins of the lower extremities. NEUROLOGIC: No overt focal deficit, no gait disturbance, speech is fluent. SKIN: Intact,warm,dry. PSYCH: Mood and behavior normal.      Assessment & Plan:     ICD-10-CM   1. Asthmatic bronchitis , chronic (HCC) - moderate  J44.89     2.  Chronic diastolic heart failure (HCC)  Q46.96     3. Hiatal hernia with GERD  K44.9    K21.9     4. Preoperative respiratory examination  Z01.811      Meds ordered this encounter  Medications   Fluticasone-Umeclidin-Vilant (TRELEGY ELLIPTA ) 100-62.5-25 MCG/ACT AEPB    Sig: Inhale 1 puff into the lungs daily.    Dispense:  28 each    Refill:  0    Lot Number?:   [id number]    Expiration Date?:   03/28/2025    Quantity:   2   Discussion:    Chronic asthmatic bronchitis Chronic asthmatic bronchitis is well-managed with Trelegy. There was a temporary issue with medication supply due to pharmacy shortages, but she had a sample to use. She has an emergency inhaler but rarely uses it. Lung examination is normal. - Provide samples of Trelegy to ensure continuous medication supply. - Advise on the use of emergency inhaler as needed.  Urinary incontinence Severe urinary incontinence  persists. Previous hip surgery temporarily alleviated symptoms, but she returned. Discussed potential treatments including a medication similar to Viagra and a surgical option involving an implant. The surgical option is preferred due to its longer duration of effect (ten years) compared to the medication (seven months).   Delroy Fields if symptoms do not improve or worsen, to please contact office for sooner follow up or seek emergency care.    I spent 32 minutes of dedicated to the care of this patient on the date of this encounter to include pre-visit review of records, face-to-face time with the patient discussing conditions above, post visit ordering of testing, clinical documentation with the electronic health record, making appropriate referrals as documented, and communicating necessary findings to members of the patients care team.     C. Chloe Counter, MD Advanced Bronchoscopy PCCM [facility]    *This note was generated using voice recognition software/Dragon and/or AI transcription program.  Despite best efforts to proofread, errors can occur which can change the meaning. Any transcriptional errors that result from this process are unintentional and may not be fully corrected at the time of dictation.

## 2024-01-10 NOTE — Telephone Encounter (Signed)
 30 day supply sent to [facility], I cannot give her any more refills than this until we see her, can offer virtual visit if needed.

## 2024-01-10 NOTE — Patient Instructions (Signed)
 VISIT SUMMARY:  Today, we discussed your ongoing issues with shortness of breath and medication supply for your asthmatic bronchitis, as well as your severe urinary incontinence. We reviewed your current treatments and explored potential options to better manage your conditions.  YOUR PLAN:  - ASTHMATIC BRONCHITIS: is a chronic lung condition that makes it hard to breathe. Your asthmatic bronchitis is well-managed with Trelegy, but you have had trouble getting your medication due to pharmacy shortages. We provided you with samples of Trelegy to ensure you don't run out. Please use your emergency inhaler as needed.  -URINARY INCONTINENCE: Urinary incontinence is the loss of bladder control, leading to accidental urine leakage. Your severe incontinence returned after your hip surgery. We discussed two treatment options: a medication similar to Viagra and a surgical procedure involving an implant. You prefer the surgical option because it offers a longer duration of effect (ten years) compared to the medication (seven months).  INSTRUCTIONS:  Please follow up with us  if you continue to have trouble obtaining your asthmatic bronchitis medication or if you have any questions about the treatment options for your urinary incontinence.  Will see you in follow-up in 4 months time call sooner should any new problems arise.

## 2024-01-15 ENCOUNTER — Encounter (INDEPENDENT_AMBULATORY_CARE_PROVIDER_SITE_OTHER): Payer: Self-pay

## 2024-01-15 DIAGNOSIS — M25551 Pain in right hip: Secondary | ICD-10-CM | POA: Diagnosis not present

## 2024-01-15 DIAGNOSIS — M4716 Other spondylosis with myelopathy, lumbar region: Secondary | ICD-10-CM | POA: Diagnosis not present

## 2024-01-16 DIAGNOSIS — I890 Lymphedema, not elsewhere classified: Secondary | ICD-10-CM | POA: Diagnosis not present

## 2024-01-23 DIAGNOSIS — I890 Lymphedema, not elsewhere classified: Secondary | ICD-10-CM | POA: Diagnosis not present

## 2024-01-23 DIAGNOSIS — Z8669 Personal history of other diseases of the nervous system and sense organs: Secondary | ICD-10-CM | POA: Diagnosis not present

## 2024-01-23 DIAGNOSIS — G620 Drug-induced polyneuropathy: Secondary | ICD-10-CM | POA: Diagnosis not present

## 2024-01-23 DIAGNOSIS — Z6831 Body mass index (BMI) 31.0-31.9, adult: Secondary | ICD-10-CM | POA: Diagnosis not present

## 2024-01-23 DIAGNOSIS — E890 Postprocedural hypothyroidism: Secondary | ICD-10-CM | POA: Diagnosis not present

## 2024-01-23 DIAGNOSIS — J45909 Unspecified asthma, uncomplicated: Secondary | ICD-10-CM | POA: Diagnosis not present

## 2024-01-23 DIAGNOSIS — E559 Vitamin D deficiency, unspecified: Secondary | ICD-10-CM | POA: Diagnosis not present

## 2024-01-23 DIAGNOSIS — R29898 Other symptoms and signs involving the musculoskeletal system: Secondary | ICD-10-CM | POA: Diagnosis not present

## 2024-01-23 DIAGNOSIS — M1611 Unilateral primary osteoarthritis, right hip: Secondary | ICD-10-CM | POA: Diagnosis not present

## 2024-01-23 DIAGNOSIS — R251 Tremor, unspecified: Secondary | ICD-10-CM | POA: Diagnosis not present

## 2024-01-23 DIAGNOSIS — M160 Bilateral primary osteoarthritis of hip: Secondary | ICD-10-CM | POA: Diagnosis not present

## 2024-01-23 DIAGNOSIS — E0500 Thyrotoxicosis with diffuse goiter without thyrotoxic crisis or storm: Secondary | ICD-10-CM | POA: Diagnosis not present

## 2024-01-23 DIAGNOSIS — T451X5A Adverse effect of antineoplastic and immunosuppressive drugs, initial encounter: Secondary | ICD-10-CM | POA: Diagnosis not present

## 2024-01-23 DIAGNOSIS — I5032 Chronic diastolic (congestive) heart failure: Secondary | ICD-10-CM | POA: Diagnosis not present

## 2024-01-23 DIAGNOSIS — K219 Gastro-esophageal reflux disease without esophagitis: Secondary | ICD-10-CM | POA: Diagnosis not present

## 2024-01-23 DIAGNOSIS — R0609 Other forms of dyspnea: Secondary | ICD-10-CM | POA: Diagnosis not present

## 2024-01-27 ENCOUNTER — Encounter: Payer: Self-pay | Admitting: Pulmonary Disease

## 2024-01-28 DIAGNOSIS — Z743 Need for continuous supervision: Secondary | ICD-10-CM | POA: Diagnosis not present

## 2024-01-28 DIAGNOSIS — I700 Atherosclerosis of aorta: Secondary | ICD-10-CM | POA: Diagnosis not present

## 2024-01-28 DIAGNOSIS — Z9049 Acquired absence of other specified parts of digestive tract: Secondary | ICD-10-CM | POA: Diagnosis not present

## 2024-01-28 DIAGNOSIS — G610 Guillain-Barre syndrome: Secondary | ICD-10-CM | POA: Diagnosis not present

## 2024-01-28 DIAGNOSIS — E669 Obesity, unspecified: Secondary | ICD-10-CM | POA: Diagnosis not present

## 2024-01-28 DIAGNOSIS — E890 Postprocedural hypothyroidism: Secondary | ICD-10-CM | POA: Diagnosis not present

## 2024-01-28 DIAGNOSIS — E66811 Obesity, class 1: Secondary | ICD-10-CM | POA: Diagnosis not present

## 2024-01-28 DIAGNOSIS — E0500 Thyrotoxicosis with diffuse goiter without thyrotoxic crisis or storm: Secondary | ICD-10-CM | POA: Diagnosis not present

## 2024-01-28 DIAGNOSIS — E785 Hyperlipidemia, unspecified: Secondary | ICD-10-CM | POA: Diagnosis present

## 2024-01-28 DIAGNOSIS — Z96642 Presence of left artificial hip joint: Secondary | ICD-10-CM | POA: Diagnosis not present

## 2024-01-28 DIAGNOSIS — N3941 Urge incontinence: Secondary | ICD-10-CM | POA: Diagnosis not present

## 2024-01-28 DIAGNOSIS — Z1152 Encounter for screening for COVID-19: Secondary | ICD-10-CM | POA: Diagnosis not present

## 2024-01-28 DIAGNOSIS — Z7982 Long term (current) use of aspirin: Secondary | ICD-10-CM | POA: Diagnosis not present

## 2024-01-28 DIAGNOSIS — M1612 Unilateral primary osteoarthritis, left hip: Secondary | ICD-10-CM | POA: Diagnosis not present

## 2024-01-28 DIAGNOSIS — Z87891 Personal history of nicotine dependence: Secondary | ICD-10-CM | POA: Diagnosis not present

## 2024-01-28 DIAGNOSIS — Z90710 Acquired absence of both cervix and uterus: Secondary | ICD-10-CM | POA: Diagnosis not present

## 2024-01-28 DIAGNOSIS — M6281 Muscle weakness (generalized): Secondary | ICD-10-CM | POA: Diagnosis not present

## 2024-01-28 DIAGNOSIS — Z751 Person awaiting admission to adequate facility elsewhere: Secondary | ICD-10-CM | POA: Diagnosis not present

## 2024-01-28 DIAGNOSIS — Z90722 Acquired absence of ovaries, bilateral: Secondary | ICD-10-CM | POA: Diagnosis not present

## 2024-01-28 DIAGNOSIS — M24152 Other articular cartilage disorders, left hip: Secondary | ICD-10-CM | POA: Diagnosis not present

## 2024-01-28 DIAGNOSIS — I5032 Chronic diastolic (congestive) heart failure: Secondary | ICD-10-CM | POA: Diagnosis present

## 2024-01-28 DIAGNOSIS — J45909 Unspecified asthma, uncomplicated: Secondary | ICD-10-CM | POA: Diagnosis present

## 2024-01-28 DIAGNOSIS — N3281 Overactive bladder: Secondary | ICD-10-CM | POA: Diagnosis present

## 2024-01-28 DIAGNOSIS — G8918 Other acute postprocedural pain: Secondary | ICD-10-CM | POA: Diagnosis not present

## 2024-01-28 DIAGNOSIS — T451X5D Adverse effect of antineoplastic and immunosuppressive drugs, subsequent encounter: Secondary | ICD-10-CM | POA: Diagnosis not present

## 2024-01-28 DIAGNOSIS — R278 Other lack of coordination: Secondary | ICD-10-CM | POA: Diagnosis not present

## 2024-01-28 DIAGNOSIS — Z6830 Body mass index (BMI) 30.0-30.9, adult: Secondary | ICD-10-CM | POA: Diagnosis not present

## 2024-01-28 DIAGNOSIS — M545 Low back pain, unspecified: Secondary | ICD-10-CM | POA: Diagnosis not present

## 2024-01-28 DIAGNOSIS — E039 Hypothyroidism, unspecified: Secondary | ICD-10-CM | POA: Diagnosis not present

## 2024-01-28 DIAGNOSIS — Z79899 Other long term (current) drug therapy: Secondary | ICD-10-CM | POA: Diagnosis not present

## 2024-01-28 DIAGNOSIS — K219 Gastro-esophageal reflux disease without esophagitis: Secondary | ICD-10-CM | POA: Diagnosis present

## 2024-01-28 DIAGNOSIS — Z8543 Personal history of malignant neoplasm of ovary: Secondary | ICD-10-CM | POA: Diagnosis not present

## 2024-01-28 DIAGNOSIS — R251 Tremor, unspecified: Secondary | ICD-10-CM | POA: Diagnosis not present

## 2024-01-28 DIAGNOSIS — Z9079 Acquired absence of other genital organ(s): Secondary | ICD-10-CM | POA: Diagnosis not present

## 2024-01-28 DIAGNOSIS — T451X5A Adverse effect of antineoplastic and immunosuppressive drugs, initial encounter: Secondary | ICD-10-CM | POA: Diagnosis not present

## 2024-01-28 DIAGNOSIS — G620 Drug-induced polyneuropathy: Secondary | ICD-10-CM | POA: Diagnosis present

## 2024-01-28 DIAGNOSIS — R262 Difficulty in walking, not elsewhere classified: Secondary | ICD-10-CM | POA: Diagnosis not present

## 2024-01-28 DIAGNOSIS — E559 Vitamin D deficiency, unspecified: Secondary | ICD-10-CM | POA: Diagnosis not present

## 2024-01-28 DIAGNOSIS — M1611 Unilateral primary osteoarthritis, right hip: Secondary | ICD-10-CM | POA: Diagnosis not present

## 2024-01-28 DIAGNOSIS — M25752 Osteophyte, left hip: Secondary | ICD-10-CM | POA: Diagnosis not present

## 2024-01-28 DIAGNOSIS — R0609 Other forms of dyspnea: Secondary | ICD-10-CM | POA: Diagnosis not present

## 2024-01-28 DIAGNOSIS — Z96643 Presence of artificial hip joint, bilateral: Secondary | ICD-10-CM | POA: Diagnosis not present

## 2024-01-28 DIAGNOSIS — Z471 Aftercare following joint replacement surgery: Secondary | ICD-10-CM | POA: Diagnosis not present

## 2024-01-28 DIAGNOSIS — I890 Lymphedema, not elsewhere classified: Secondary | ICD-10-CM | POA: Diagnosis not present

## 2024-02-01 DIAGNOSIS — G620 Drug-induced polyneuropathy: Secondary | ICD-10-CM | POA: Diagnosis not present

## 2024-02-01 DIAGNOSIS — R262 Difficulty in walking, not elsewhere classified: Secondary | ICD-10-CM | POA: Diagnosis not present

## 2024-02-01 DIAGNOSIS — Z79899 Other long term (current) drug therapy: Secondary | ICD-10-CM | POA: Diagnosis not present

## 2024-02-01 DIAGNOSIS — E66811 Obesity, class 1: Secondary | ICD-10-CM | POA: Diagnosis not present

## 2024-02-01 DIAGNOSIS — K219 Gastro-esophageal reflux disease without esophagitis: Secondary | ICD-10-CM | POA: Diagnosis not present

## 2024-02-01 DIAGNOSIS — I5032 Chronic diastolic (congestive) heart failure: Secondary | ICD-10-CM | POA: Diagnosis not present

## 2024-02-01 DIAGNOSIS — E785 Hyperlipidemia, unspecified: Secondary | ICD-10-CM | POA: Diagnosis not present

## 2024-02-01 DIAGNOSIS — T451X5D Adverse effect of antineoplastic and immunosuppressive drugs, subsequent encounter: Secondary | ICD-10-CM | POA: Diagnosis not present

## 2024-02-01 DIAGNOSIS — N3941 Urge incontinence: Secondary | ICD-10-CM | POA: Diagnosis not present

## 2024-02-01 DIAGNOSIS — N3281 Overactive bladder: Secondary | ICD-10-CM | POA: Diagnosis not present

## 2024-02-01 DIAGNOSIS — J45909 Unspecified asthma, uncomplicated: Secondary | ICD-10-CM | POA: Diagnosis not present

## 2024-02-01 DIAGNOSIS — Z743 Need for continuous supervision: Secondary | ICD-10-CM | POA: Diagnosis not present

## 2024-02-01 DIAGNOSIS — M25552 Pain in left hip: Secondary | ICD-10-CM | POA: Diagnosis not present

## 2024-02-01 DIAGNOSIS — M545 Low back pain, unspecified: Secondary | ICD-10-CM | POA: Diagnosis not present

## 2024-02-01 DIAGNOSIS — E039 Hypothyroidism, unspecified: Secondary | ICD-10-CM | POA: Diagnosis not present

## 2024-02-01 DIAGNOSIS — Z96642 Presence of left artificial hip joint: Secondary | ICD-10-CM | POA: Diagnosis not present

## 2024-02-01 DIAGNOSIS — Z96643 Presence of artificial hip joint, bilateral: Secondary | ICD-10-CM | POA: Diagnosis not present

## 2024-02-01 DIAGNOSIS — E0500 Thyrotoxicosis with diffuse goiter without thyrotoxic crisis or storm: Secondary | ICD-10-CM | POA: Diagnosis not present

## 2024-02-01 DIAGNOSIS — G8918 Other acute postprocedural pain: Secondary | ICD-10-CM | POA: Diagnosis not present

## 2024-02-01 DIAGNOSIS — Z471 Aftercare following joint replacement surgery: Secondary | ICD-10-CM | POA: Diagnosis not present

## 2024-02-01 DIAGNOSIS — R278 Other lack of coordination: Secondary | ICD-10-CM | POA: Diagnosis not present

## 2024-02-01 DIAGNOSIS — M1611 Unilateral primary osteoarthritis, right hip: Secondary | ICD-10-CM | POA: Diagnosis not present

## 2024-02-01 DIAGNOSIS — M6281 Muscle weakness (generalized): Secondary | ICD-10-CM | POA: Diagnosis not present

## 2024-02-01 DIAGNOSIS — E559 Vitamin D deficiency, unspecified: Secondary | ICD-10-CM | POA: Diagnosis not present

## 2024-02-01 DIAGNOSIS — G610 Guillain-Barre syndrome: Secondary | ICD-10-CM | POA: Diagnosis not present

## 2024-02-01 DIAGNOSIS — Z8543 Personal history of malignant neoplasm of ovary: Secondary | ICD-10-CM | POA: Diagnosis not present

## 2024-02-01 DIAGNOSIS — R251 Tremor, unspecified: Secondary | ICD-10-CM | POA: Diagnosis not present

## 2024-02-04 DIAGNOSIS — Z79899 Other long term (current) drug therapy: Secondary | ICD-10-CM | POA: Diagnosis not present

## 2024-02-04 DIAGNOSIS — Z96642 Presence of left artificial hip joint: Secondary | ICD-10-CM | POA: Diagnosis not present

## 2024-02-04 DIAGNOSIS — J45909 Unspecified asthma, uncomplicated: Secondary | ICD-10-CM | POA: Diagnosis not present

## 2024-02-04 DIAGNOSIS — R262 Difficulty in walking, not elsewhere classified: Secondary | ICD-10-CM | POA: Diagnosis not present

## 2024-02-04 DIAGNOSIS — I5032 Chronic diastolic (congestive) heart failure: Secondary | ICD-10-CM | POA: Diagnosis not present

## 2024-02-04 DIAGNOSIS — Z471 Aftercare following joint replacement surgery: Secondary | ICD-10-CM | POA: Diagnosis not present

## 2024-02-04 DIAGNOSIS — E039 Hypothyroidism, unspecified: Secondary | ICD-10-CM | POA: Diagnosis not present

## 2024-02-13 DIAGNOSIS — Z96643 Presence of artificial hip joint, bilateral: Secondary | ICD-10-CM | POA: Diagnosis not present

## 2024-02-13 DIAGNOSIS — M25552 Pain in left hip: Secondary | ICD-10-CM | POA: Diagnosis not present

## 2024-02-21 ENCOUNTER — Ambulatory Visit (INDEPENDENT_AMBULATORY_CARE_PROVIDER_SITE_OTHER): Admitting: Nurse Practitioner

## 2024-02-21 ENCOUNTER — Encounter: Payer: Self-pay | Admitting: Nurse Practitioner

## 2024-02-21 VITALS — BP 118/82 | HR 96 | Temp 97.7°F | Ht 66.0 in | Wt 192.4 lb

## 2024-02-21 DIAGNOSIS — M1612 Unilateral primary osteoarthritis, left hip: Secondary | ICD-10-CM

## 2024-02-21 DIAGNOSIS — I890 Lymphedema, not elsewhere classified: Secondary | ICD-10-CM

## 2024-02-21 NOTE — Progress Notes (Signed)
 Established Patient Office Visit  Subjective:  Patient ID: Melissa Anderson, female    DOB: [birth year]  Age: 83 y.o. MRN: [id number]  CC:  Chief Complaint  Patient presents with   Hospitalization Follow-up    Hospital follow up from [facility] on left hip replacement     HPI  Melissa Anderson presents for hospital follow-up after total left hip arthroplasty. She had surgery on 01/28/2024 and [facility] by Dr. Sande, [place] , and returned home after three weeks in an assisted living facility. She reports no pain at the surgical site and uses Tylenol . She has not started home physical therapy but previously benefited from outpatient therapy. She uses a walker for stability due to neuropathy in her legs.  She manages lymphedema with a daily pump, though it is not fully effective. She previously received beneficial treatment in [place] and finds it difficult to apply cream on her leg.  Patient was advised not to undergo any elective dental cleanings, dental surgery or other invasive procedures within 3 months of surgery.   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    04/2021 [facility]   Cancer (HCC) 2019   b/l ovaries and left fallopian tube pelvic lymph nodes negative    Guillain Barr syndrome (HCC)    2/2 flu shot 05/2018,  intubated x 8 days   Hyperlipidemia    Lymphedema    Overactive bladder    Thyroid  disease    UTI (urinary tract infection)    Vitamin D  deficiency     Past Surgical History:  Procedure Laterality Date   ABDOMINAL HYSTERECTOMY     02/15/18    CHOLECYSTECTOMY  2008   LAPAROSCOPIC VAGINAL HYSTERECTOMY WITH SALPINGO OOPHORECTOMY  02/15/2018   PORTACATH PLACEMENT Left 02/25/2018   Procedure: INSERTION PORT-A-CATH;  Surgeon: Dessa Reyes ORN, MD;  Location: [facility] ORS;  Service: General;  Laterality: Left;   TOTAL ABDOMINAL  HYSTERECTOMY W/ BILATERAL SALPINGOOPHORECTOMY  02/15/2018   lymph nodes removed    Family History  Problem Relation Age of Onset   Sudden death Mother 10       drowning in hurricane Katrina   Pancreatic cancer Father        deceased early 72s; smoker   Alcohol abuse Father    COPD Father    Melanoma Sister        deceased at 81   COPD Sister    Heart disease Brother        Angina   Coronary artery disease Brother    Heart disease Brother        Heart murmur   Prostate cancer Maternal Uncle        deceased 63   Breast cancer Paternal Aunt        unsure of this dx    Social History   Socioeconomic History   Marital status: Widowed    Spouse name: Not on file   Number of children: Not on file   Years of education: Not on file   Highest education level: Not on file  Occupational History   Not on file  Tobacco Use   Smoking status: Former    Current packs/day: 0.00    Average packs/day: 1 pack/day for 23.0 years (23.0 ttl pk-yrs)    Types: Cigarettes    Start date: 1964  Quit date: 98    Years since quitting: 38.5   Smokeless tobacco: Never  Vaping Use   Vaping status: Never Used  Substance and Sexual Activity   Alcohol use: Never   Drug use: Never   Sexual activity: Not Currently  Other Topics Concern   Not on file  Social History Occupational hygienist, bachelors degree    Lives in [place] here visiting daughter Eleanor    Former smoker quit age 55 y.o    Social Drivers of Corporate investment banker Strain: Low Risk  (01/30/2024)   Received from [facility]   Overall Financial Resource Strain (CARDIA)    Difficulty of Paying Living Expenses: Not hard at all  Food Insecurity: No Food Insecurity (01/30/2024)   Received from [facility]   Hunger Vital Sign    Within the past 12 months, you worried that your food would run out before you got the money to buy more.: Never true    Within the past 12 months, the food you  bought just didn't last and you didn't have money to get more.: Never true  Transportation Needs: No Transportation Needs (01/30/2024)   Received from [facility] - Transportation    In the past 12 months, has lack of transportation kept you from medical appointments or from getting medications?: No    Lack of Transportation (Non-Medical): No  Physical Activity: Insufficiently Active (12/21/2022)   Exercise Vital Sign    Days of Exercise per Week: 3 days    Minutes of Exercise per Session: 30 min  Stress: No Stress Concern Present (12/21/2022)   [employer] of Occupational Health - Occupational Stress Questionnaire    Feeling of Stress : Not at all  Social Connections: Unknown (12/21/2022)   Social Connection and Isolation Panel    Frequency of Communication with Friends and Family: Not on file    Frequency of Social Gatherings with Friends and Family: More than three times a week    Attends Religious Services: Not on file    Active Member of Clubs or Organizations: Not on file    Attends Banker Meetings: Not on file    Marital Status: Not on file  Intimate Partner Violence: Not At Risk (12/21/2022)   Humiliation, Afraid, Rape, and Kick questionnaire    Fear of Current or Ex-Partner: No    Emotionally Abused: No    Physically Abused: No    Sexually Abused: No     Outpatient Medications Prior to Visit  Medication Sig Dispense Refill   aspirin EC 81 MG tablet Take 81 mg by mouth daily. Swallow whole.     cyanocobalamin  (VITAMIN B12) 500 MCG tablet Take 500 mcg by mouth daily.     finasteride (PROSCAR) 5 MG tablet Take 5 mg by mouth daily.     Fluticasone-Umeclidin-Vilant (TRELEGY ELLIPTA ) 100-62.5-25 MCG/ACT AEPB Inhale 1 puff into the lungs daily. 28 each 0   furosemide  (LASIX ) 20 MG tablet Take 1 tablet (20 mg total) by mouth daily. 90 tablet 3   pantoprazole  (PROTONIX ) 40 MG tablet Take 1 tablet (40 mg total) by mouth daily. 90 tablet 1    SYNTHROID  150 MCG tablet TAKE 1 TABLET (150 MCG TOTAL) BY MOUTH DAILY BEFORE BREAKFAST. 30 MIN BEFORE FOOD. ON SUNDAYS TAKE 1.5 TABLETS STOP 175 MCG 96 tablet 5   TRELEGY ELLIPTA  100-62.5-25 MCG/ACT AEPB INHALE 1 PUFF BY MOUTH EVERY DAY 60 each 11  Vibegron  (GEMTESA ) 75 MG TABS Take 1 tablet (75 mg total) by mouth daily. 30 tablet 0   No facility-administered medications prior to visit.    Allergies  Allergen Reactions   Novocain [Procaine] Palpitations   Other Other (See Comments)    Supposedly allergic to Shingles and Pneumonia vaccine.   Celebrex  [Celecoxib ] Other (See Comments)    Decreased gfr   Crestor  [Rosuvastatin ]     Myopathy     Influenza Vaccines     ?GBS 05/2018    Lipitor [Atorvastatin Calcium ]     Myalgias      ROS Review of Systems Negative unless indicated in HPI.    Objective:    Physical Exam Constitutional:      Appearance: Normal appearance.  Cardiovascular:     Rate and Rhythm: Normal rate and regular rhythm.  Pulmonary:     Effort: Pulmonary effort is normal.     Breath sounds: Normal breath sounds.  Skin:    General: Skin is warm.     Findings: No bruising.  Neurological:     General: No focal deficit present.     Mental Status: She is alert and oriented to person, place, and time.  Psychiatric:        Mood and Affect: Mood normal.        Behavior: Behavior normal.     BP 118/82   Pulse 96   Temp 97.7 F (36.5 C)   Ht 5' 6 (1.676 m)   Wt 192 lb 6.4 oz (87.3 kg)   SpO2 93%   BMI 31.05 kg/m  Wt Readings from Last 3 Encounters:  02/21/24 192 lb 6.4 oz (87.3 kg)  01/10/24 188 lb 9.6 oz (85.5 kg)  12/26/23 188 lb (85.3 kg)     Health Maintenance  Topic Date Due   DTaP/Tdap/Td (1 - Tdap) Never done   Medicare Annual Wellness (AWV)  12/21/2023   COVID-19 Vaccine (4 - 2024-25 season) 03/07/2024 (Originally 04/29/2023)   Pneumococcal Vaccine: 50+ Years (1 of 2 - PCV) 03/14/2024 (Originally 05/31/1960)   DEXA SCAN  Completed    Hepatitis B Vaccines  Aged Out   HPV VACCINES  Aged Out   Meningococcal B Vaccine  Aged Out   INFLUENZA VACCINE  Discontinued   Hepatitis C Screening  Discontinued   Zoster Vaccines- Shingrix  Discontinued    There are no preventive care reminders to display for this patient.  Lab Results  Component Value Date   TSH 4.79 12/20/2023   Lab Results  Component Value Date   WBC 7.8 08/06/2023   HGB 12.1 08/06/2023   HCT 37.2 08/06/2023   MCV 96.9 08/06/2023   PLT 335.0 08/06/2023   Lab Results  Component Value Date   NA 140 12/20/2023   K 4.4 12/20/2023   CO2 27 12/20/2023   GLUCOSE 89 12/20/2023   BUN 24 (H) 12/20/2023   CREATININE 0.69 12/20/2023   BILITOT 0.5 12/20/2023   ALKPHOS 68 12/20/2023   AST 23 12/20/2023   ALT 22 12/20/2023   PROT 6.7 12/20/2023   ALBUMIN 4.2 12/20/2023   CALCIUM  9.3 12/20/2023   ANIONGAP 7 02/20/2023   GFR 80.18 12/20/2023   Lab Results  Component Value Date   CHOL 198 12/20/2023   Lab Results  Component Value Date   HDL 64.50 12/20/2023   Lab Results  Component Value Date   LDLCALC 115 (H) 12/20/2023   Lab Results  Component Value Date   TRIG 91.0 12/20/2023  Lab Results  Component Value Date   CHOLHDL 3 12/20/2023   Lab Results  Component Value Date   HGBA1C 5.3 05/09/2018      Assessment & Plan:  Lymphedema Assessment & Plan: Lymphedema increases infection risk post-surgery. Current pump use is insufficient. Plans to resume specialized therapy with previous provider. - Send referral for lymphedema massage therapy. - Coordinate resumption of lymphedema treatment with previous provider post home health services.  Orders: -     Ambulatory referral to Physical Therapy  Arthropathy of left hip Assessment & Plan: Recovering well post-surgery with no pain and stable surgical site. Using walker due to leg neuropathy. Prefers home physical therapy initially. - Arrange home physical therapy for two weeks until six-week  postoperative period is complete. - Advise continued use of walker for stability.  Orders: -     Ambulatory referral to Physical Therapy    Follow-up: No follow-ups on file.   Marleigh Kaylor, NP

## 2024-02-21 NOTE — Patient Instructions (Addendum)
 You had a follow-up visit after your recent left hip replacement surgery. You are recovering well with no pain at the surgical site and are using a walker for stability. We discussed your lymphedema.  FOLLOW-UP: You are awaiting a visit from the home health nurse and physical therapist and need to confirm the home health agency details. -Confirm the name of the home health agency providing services. -Contact our office or send a MyChart message with the agency details. -Plan for outpatient physical therapy after the six-week postoperative period. -Plan for lymphedema massage.

## 2024-02-22 DIAGNOSIS — R252 Cramp and spasm: Secondary | ICD-10-CM | POA: Diagnosis not present

## 2024-02-22 DIAGNOSIS — J45909 Unspecified asthma, uncomplicated: Secondary | ICD-10-CM | POA: Diagnosis not present

## 2024-02-22 DIAGNOSIS — I890 Lymphedema, not elsewhere classified: Secondary | ICD-10-CM | POA: Diagnosis not present

## 2024-02-22 DIAGNOSIS — M1990 Unspecified osteoarthritis, unspecified site: Secondary | ICD-10-CM | POA: Diagnosis not present

## 2024-02-22 DIAGNOSIS — Z7951 Long term (current) use of inhaled steroids: Secondary | ICD-10-CM | POA: Diagnosis not present

## 2024-02-22 DIAGNOSIS — Z96641 Presence of right artificial hip joint: Secondary | ICD-10-CM | POA: Diagnosis not present

## 2024-02-22 DIAGNOSIS — I5032 Chronic diastolic (congestive) heart failure: Secondary | ICD-10-CM | POA: Diagnosis not present

## 2024-02-22 DIAGNOSIS — E039 Hypothyroidism, unspecified: Secondary | ICD-10-CM | POA: Diagnosis not present

## 2024-02-22 DIAGNOSIS — G629 Polyneuropathy, unspecified: Secondary | ICD-10-CM | POA: Diagnosis not present

## 2024-02-22 DIAGNOSIS — Z9181 History of falling: Secondary | ICD-10-CM | POA: Diagnosis not present

## 2024-02-22 DIAGNOSIS — K219 Gastro-esophageal reflux disease without esophagitis: Secondary | ICD-10-CM | POA: Diagnosis not present

## 2024-02-22 DIAGNOSIS — N3941 Urge incontinence: Secondary | ICD-10-CM | POA: Diagnosis not present

## 2024-02-22 DIAGNOSIS — Z96642 Presence of left artificial hip joint: Secondary | ICD-10-CM | POA: Diagnosis not present

## 2024-02-22 DIAGNOSIS — G610 Guillain-Barre syndrome: Secondary | ICD-10-CM | POA: Diagnosis not present

## 2024-02-22 DIAGNOSIS — Z7982 Long term (current) use of aspirin: Secondary | ICD-10-CM | POA: Diagnosis not present

## 2024-02-22 DIAGNOSIS — C569 Malignant neoplasm of unspecified ovary: Secondary | ICD-10-CM | POA: Diagnosis not present

## 2024-02-22 DIAGNOSIS — Z471 Aftercare following joint replacement surgery: Secondary | ICD-10-CM | POA: Diagnosis not present

## 2024-02-22 DIAGNOSIS — H9190 Unspecified hearing loss, unspecified ear: Secondary | ICD-10-CM | POA: Diagnosis not present

## 2024-02-22 DIAGNOSIS — N3281 Overactive bladder: Secondary | ICD-10-CM | POA: Diagnosis not present

## 2024-02-25 DIAGNOSIS — C569 Malignant neoplasm of unspecified ovary: Secondary | ICD-10-CM | POA: Diagnosis not present

## 2024-02-25 DIAGNOSIS — Z471 Aftercare following joint replacement surgery: Secondary | ICD-10-CM | POA: Diagnosis not present

## 2024-02-25 DIAGNOSIS — I5032 Chronic diastolic (congestive) heart failure: Secondary | ICD-10-CM | POA: Diagnosis not present

## 2024-02-25 DIAGNOSIS — G629 Polyneuropathy, unspecified: Secondary | ICD-10-CM | POA: Diagnosis not present

## 2024-02-25 DIAGNOSIS — I890 Lymphedema, not elsewhere classified: Secondary | ICD-10-CM | POA: Diagnosis not present

## 2024-02-25 DIAGNOSIS — Z96642 Presence of left artificial hip joint: Secondary | ICD-10-CM | POA: Diagnosis not present

## 2024-02-28 DIAGNOSIS — G629 Polyneuropathy, unspecified: Secondary | ICD-10-CM | POA: Diagnosis not present

## 2024-02-28 DIAGNOSIS — Z471 Aftercare following joint replacement surgery: Secondary | ICD-10-CM | POA: Diagnosis not present

## 2024-02-28 DIAGNOSIS — I890 Lymphedema, not elsewhere classified: Secondary | ICD-10-CM | POA: Diagnosis not present

## 2024-02-28 DIAGNOSIS — C569 Malignant neoplasm of unspecified ovary: Secondary | ICD-10-CM | POA: Diagnosis not present

## 2024-02-28 DIAGNOSIS — I5032 Chronic diastolic (congestive) heart failure: Secondary | ICD-10-CM | POA: Diagnosis not present

## 2024-02-28 DIAGNOSIS — Z96642 Presence of left artificial hip joint: Secondary | ICD-10-CM | POA: Diagnosis not present

## 2024-03-03 DIAGNOSIS — Z471 Aftercare following joint replacement surgery: Secondary | ICD-10-CM | POA: Diagnosis not present

## 2024-03-03 DIAGNOSIS — G629 Polyneuropathy, unspecified: Secondary | ICD-10-CM | POA: Diagnosis not present

## 2024-03-03 DIAGNOSIS — C569 Malignant neoplasm of unspecified ovary: Secondary | ICD-10-CM | POA: Diagnosis not present

## 2024-03-03 DIAGNOSIS — Z96642 Presence of left artificial hip joint: Secondary | ICD-10-CM | POA: Diagnosis not present

## 2024-03-03 DIAGNOSIS — I5032 Chronic diastolic (congestive) heart failure: Secondary | ICD-10-CM | POA: Diagnosis not present

## 2024-03-03 DIAGNOSIS — I890 Lymphedema, not elsewhere classified: Secondary | ICD-10-CM | POA: Diagnosis not present

## 2024-03-03 DIAGNOSIS — M1612 Unilateral primary osteoarthritis, left hip: Secondary | ICD-10-CM | POA: Insufficient documentation

## 2024-03-03 NOTE — Assessment & Plan Note (Signed)
 Recovering well post-surgery with no pain and stable surgical site. Using walker due to leg neuropathy. Prefers home physical therapy initially. - Arrange home physical therapy for two weeks until six-week postoperative period is complete. - Advise continued use of walker for stability.

## 2024-03-03 NOTE — Assessment & Plan Note (Signed)
 Lymphedema increases infection risk post-surgery. Current pump use is insufficient. Plans to resume specialized therapy with previous provider. - Send referral for lymphedema massage therapy. - Coordinate resumption of lymphedema treatment with previous provider post home health services.

## 2024-03-06 DIAGNOSIS — I5032 Chronic diastolic (congestive) heart failure: Secondary | ICD-10-CM | POA: Diagnosis not present

## 2024-03-06 DIAGNOSIS — I890 Lymphedema, not elsewhere classified: Secondary | ICD-10-CM | POA: Diagnosis not present

## 2024-03-06 DIAGNOSIS — C569 Malignant neoplasm of unspecified ovary: Secondary | ICD-10-CM | POA: Diagnosis not present

## 2024-03-06 DIAGNOSIS — G629 Polyneuropathy, unspecified: Secondary | ICD-10-CM | POA: Diagnosis not present

## 2024-03-06 DIAGNOSIS — Z96642 Presence of left artificial hip joint: Secondary | ICD-10-CM | POA: Diagnosis not present

## 2024-03-06 DIAGNOSIS — Z471 Aftercare following joint replacement surgery: Secondary | ICD-10-CM | POA: Diagnosis not present

## 2024-03-07 ENCOUNTER — Telehealth: Payer: Self-pay

## 2024-03-07 NOTE — Telephone Encounter (Signed)
 Copied from CRM [id number]. Topic: General - Other >> Mar 07, 2024 10:01 AM [place] J wrote: Reason for CRM: Patient wanted to let Vincente Saber know that the Home Health agency she's being seen at is [facility].  She also wanted to let her know that she's already had physical therapy 3 times and augmentation physical therapy 3 times as well but this time it's the best physical therapy she's every received.

## 2024-03-10 DIAGNOSIS — I5032 Chronic diastolic (congestive) heart failure: Secondary | ICD-10-CM | POA: Diagnosis not present

## 2024-03-10 DIAGNOSIS — G629 Polyneuropathy, unspecified: Secondary | ICD-10-CM | POA: Diagnosis not present

## 2024-03-10 DIAGNOSIS — C569 Malignant neoplasm of unspecified ovary: Secondary | ICD-10-CM | POA: Diagnosis not present

## 2024-03-10 DIAGNOSIS — I890 Lymphedema, not elsewhere classified: Secondary | ICD-10-CM | POA: Diagnosis not present

## 2024-03-10 DIAGNOSIS — Z471 Aftercare following joint replacement surgery: Secondary | ICD-10-CM | POA: Diagnosis not present

## 2024-03-10 DIAGNOSIS — Z96642 Presence of left artificial hip joint: Secondary | ICD-10-CM | POA: Diagnosis not present

## 2024-03-18 DIAGNOSIS — G629 Polyneuropathy, unspecified: Secondary | ICD-10-CM | POA: Diagnosis not present

## 2024-03-18 DIAGNOSIS — Z471 Aftercare following joint replacement surgery: Secondary | ICD-10-CM | POA: Diagnosis not present

## 2024-03-18 DIAGNOSIS — I890 Lymphedema, not elsewhere classified: Secondary | ICD-10-CM | POA: Diagnosis not present

## 2024-03-18 DIAGNOSIS — I5032 Chronic diastolic (congestive) heart failure: Secondary | ICD-10-CM | POA: Diagnosis not present

## 2024-03-18 DIAGNOSIS — C569 Malignant neoplasm of unspecified ovary: Secondary | ICD-10-CM | POA: Diagnosis not present

## 2024-03-18 DIAGNOSIS — Z96642 Presence of left artificial hip joint: Secondary | ICD-10-CM | POA: Diagnosis not present

## 2024-03-19 ENCOUNTER — Ambulatory Visit: Admission: EM | Admit: 2024-03-19 | Discharge: 2024-03-19 | Disposition: A | Discharge status: Home / Self Care

## 2024-03-19 ENCOUNTER — Encounter: Payer: Self-pay | Admitting: Emergency Medicine

## 2024-03-19 ENCOUNTER — Ambulatory Visit: Payer: Self-pay

## 2024-03-19 DIAGNOSIS — L03116 Cellulitis of left lower limb: Secondary | ICD-10-CM

## 2024-03-19 MED ORDER — CEPHALEXIN 500 MG PO CAPS: 500.0000 mg | ORAL_CAPSULE | Freq: Four times a day (QID) | ORAL | 0 refills | Status: DC

## 2024-03-19 NOTE — ED Triage Notes (Signed)
 Patient complains of left pain and redness x 1 week. Patient has been using a ointment on leg for redness from doctor. Rates pain 5/10. Patient has an OTC pain patch on leg.

## 2024-03-19 NOTE — Telephone Encounter (Signed)
 LMTCB. Need to let pt know that she does not need to wait until she can get seen in our office, she needs to be evaluated immediately. Need to explain to pt that this could be a blood clot.

## 2024-03-19 NOTE — Telephone Encounter (Signed)
 FYI Only or Action Required?: Action required by provider: request for appointment.  Patient was last seen in primary care on 02/21/2024 by Vincente Saber, NP.  Called Nurse Triage reporting Leg Pain.  Symptoms began several days ago.  Interventions attempted: Other: cream from ortho .  Symptoms are: gradually worsening.  Triage Disposition: See HCP Within 4 Hours (Or PCP Triage)  Patient/caregiver understands and will follow disposition?: No, wishes to speak with PCP  Copied from CRM #[id number]. Topic: Clinical - Red Word Triage >> Mar 19, 2024  1:04 PM Leah C wrote: Red Word that prompted transfer to Nurse Triage:  Redness in left leg right above ankle, ten iches goes around the leg. Painful and doesn't know if it's an infection or not. Reason for Disposition  [1] Red area or streak AND [2] large (> 2 inches or 5 cm)  Answer Assessment - Initial Assessment Questions 1. ONSET: When did the pain start?      Weeks, was given cream, not helping 2. LOCATION: Where is the pain located?      L lower leg 3. PAIN: How bad is the pain?    (Scale 1-10; or mild, moderate, severe)     Constant 5 5. CAUSE: What do you think is causing the leg pain?     PT told pt that it was possibly cellulitis 6. OTHER SYMPTOMS: Do you have any other symptoms? (e.g., chest pain, back pain, breathing difficulty, swelling, rash, fever, numbness, weakness)     Redness, warmth to the area, about 7 tall and wraps around the leg, per pt  Pt refusing UC at this time, states that she wants a real doctor to look at her leg and that she doesn't trust UC. No appts in clinic until 7/28.  Protocols used: Leg Pain-A-AH

## 2024-03-19 NOTE — ED Provider Notes (Signed)
 Melissa Anderson    CSN: [id number] Arrival date & time: 03/19/24  1359      History   Chief Complaint Chief Complaint  Patient presents with   Leg Pain    HPI Melissa Anderson is a 83 y.o. female.   Patient presents for evaluation of pain and increased sensitivity to the left lower leg beginning 7 days ago.  Has been experiencing lymphedema and redness to the skin at baseline, swelling is at baseline.  Endorses some worsening redness yesterday but has improved today.  Has been using a topical cream given by her orthopedic doctor over the affected area.  Denies injury, drainage.  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   Chronic kidney disease (CKD) stage G2/A2, mildly decreased glomerular filtration rate (GFR) between 60-89 mL/min/1.73 square meter and albuminuria creatinine ratio between 30-299 mg/g 12/20/2023   Postoperative anemia due to acute blood loss 08/06/2023   Hospital discharge follow-up 08/06/2023   Decreased calculated glomerular filtration rate (GFR) 01/05/2023   Obesity (BMI 30.0-34.9) 01/04/2023   Leg swelling 12/21/2022   Myalgia due to statin 12/14/2022   Hiatal hernia 12/14/2022   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/14/2022   Dyspnea on exertion 12/31/2021   Chronic diastolic heart failure (HCC) 12/31/2021   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   Lymphedema 09/28/2020   Abnormal gait 09/28/2020   Arthritis, 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   Aortic atherosclerosis (HCC) 07/14/2020   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   History of ovarian cancer 02/11/2020   Acute pain of right shoulder 08/15/2018   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   Vitamin D  deficiency 05/08/2018   Neuropathy 05/08/2018   Overactive bladder 05/08/2018   Bilateral leg edema 05/08/2018   Hyperlipidemia LDL goal <100 05/08/2018   Goals of care, counseling/discussion 02/25/2018   Ovarian cancer (HCC) 02/22/2018   Graves' disease 02/13/2018    Past Surgical History:  Procedure Laterality Date   ABDOMINAL HYSTERECTOMY     02/15/18    CHOLECYSTECTOMY  2008   LAPAROSCOPIC VAGINAL HYSTERECTOMY WITH SALPINGO OOPHORECTOMY  02/15/2018   PORTACATH PLACEMENT Left 02/25/2018   Procedure: INSERTION PORT-A-CATH;  Surgeon: Dessa Reyes ORN, MD;  Location: [facility] ORS;  Service: General;  Laterality: Left;   REPLACEMENT TOTAL HIP W/  RESURFACING IMPLANTS Left    TOTAL ABDOMINAL HYSTERECTOMY W/ BILATERAL SALPINGOOPHORECTOMY  02/15/2018   lymph nodes removed    OB History     Gravida  2   Para      Term      Preterm      AB      Living  2      SAB      IAB      Ectopic      Multiple      Live Births  Obstetric Comments  Menstrual age: 51  Age 1st Pregnancy: 16           Home Medications    Prior to Admission medications   Medication Sig Start Date End Date Taking? Authorizing Provider  cephALEXin  (KEFLEX ) 500 MG capsule Take 1 capsule (500 mg total) by mouth 4 (four) times daily. 03/19/24  Yes Estrella Alcaraz R, NP  mometasone (ELOCON) 0.1 % ointment Apply topically daily. 03/12/24  Yes [provider]  aspirin EC 81 MG tablet Take 81 mg by mouth daily. Swallow whole.    [provider]  cyanocobalamin  (VITAMIN B12) 500 MCG tablet Take 500 mcg by mouth daily.    [provider]  finasteride (PROSCAR) 5 MG tablet Take 5 mg by  mouth daily. 03/14/21   [provider]  Fluticasone-Umeclidin-Vilant (TRELEGY ELLIPTA ) 100-62.5-25 MCG/ACT AEPB Inhale 1 puff into the lungs daily. 01/10/24   Assaker, Darrin, MD  furosemide  (LASIX ) 20 MG tablet Take 1 tablet (20 mg total) by mouth daily. 11/20/23   End, Lonni, MD  pantoprazole  (PROTONIX ) 40 MG tablet Take 1 tablet (40 mg total) by mouth daily. 12/20/23   Marylynn Verneita CROME, MD  SYNTHROID  150 MCG tablet TAKE 1 TABLET (150 MCG TOTAL) BY MOUTH DAILY BEFORE BREAKFAST. 30 MIN BEFORE FOOD. ON SUNDAYS TAKE 1.5 TABLETS STOP 175 MCG 12/19/23   Marylynn Verneita CROME, MD  TRELEGY ELLIPTA  100-62.5-25 MCG/ACT AEPB INHALE 1 PUFF BY MOUTH EVERY DAY 12/10/23   Tamea Dedra CROME, MD  Vibegron  (GEMTESA ) 75 MG TABS Take 1 tablet (75 mg total) by mouth daily. 01/10/24   Maurine Lukes, PA-C    Family History Family History  Problem Relation Age of Onset   Sudden death Mother 17       drowning in hurricane [place]rina   Pancreatic cancer Father        deceased early 54s; smoker   Alcohol abuse Father    COPD Father    Melanoma Sister        deceased at 8   COPD Sister    Heart disease Brother        Angina   Coronary artery disease Brother    Heart disease Brother        Heart murmur   Prostate cancer Maternal Uncle        deceased 78   Breast cancer Paternal Aunt        unsure of this dx    Social History Social History   Tobacco Use   Smoking status: Former    Current packs/day: 0.00    Average packs/day: 1 pack/day for 23.0 years (23.0 ttl pk-yrs)    Types: Cigarettes    Start date: 1964    Quit date: 63    Years since quitting: 38.5   Smokeless tobacco: Never  Vaping Use   Vaping status: Never Used  Substance Use Topics   Alcohol use: Never   Drug use: Yes    Types: Methamphetamines     Allergies   Novocain [procaine], Other, Celebrex  [celecoxib ], Crestor  [rosuvastatin ], Influenza vaccines, and Lipitor [atorvastatin calcium ]   Review of  Systems Review of Systems   Physical Exam Triage Vital Signs ED Triage Vitals  Encounter Vitals Group     BP 03/19/24 1434 131/81     Girls Systolic BP Percentile --      Girls Diastolic BP Percentile --      Boys Systolic BP Percentile --      Boys Diastolic BP Percentile --  Pulse Rate 03/19/24 1434 72     Resp 03/19/24 1434 18     Temp 03/19/24 1434 97.9 F (36.6 C)     Temp Source 03/19/24 1434 Oral     SpO2 03/19/24 1434 97 %     Weight --      Height --      Head Circumference --      Peak Flow --      Pain Score 03/19/24 1437 5     Pain Loc --      Pain Education --      Exclude from Growth Chart --    No data found.  Updated Vital Signs BP 131/81 (BP Location: Left Arm)   Pulse 72   Temp 97.9 F (36.6 C) (Oral)   Resp 18   SpO2 97%   Visual Acuity Right Eye Distance:   Left Eye Distance:   Bilateral Distance:    Right Eye Near:   Left Eye Near:    Bilateral Near:     Physical Exam Constitutional:      Appearance: Normal appearance.  Eyes:     Extraocular Movements: Extraocular movements intact.  Pulmonary:     Effort: Pulmonary effort is normal.  Skin:    Comments: Erythema present to the anterior of the left lower extremity, skin hot to touch and tender to palpation, no drainage noted, 2+ dorsalis pedis pulse  Neurological:     Mental Status: She is alert and oriented to person, place, and time. Mental status is at baseline.      UC Treatments / Results  Labs (all labs ordered are listed, but only abnormal results are displayed) Labs Reviewed - No data to display  EKG   Radiology No results found.  Procedures Procedures (including critical care time)  Medications Ordered in UC Medications - No data to display  Initial Impression / Assessment and Plan / UC Course  I have reviewed the triage vital signs and the nursing notes.  Pertinent labs & imaging results that were available during my care of the patient were reviewed by  me and considered in my medical decision making (see chart for details).  Cellulitis of left leg  Concerning for infection due to heat to the skin as well as new tenderness, discussed with patient, empirically placed on cephalexin  for management of cellulitis, recommended over-the-counter analgesics and nonpharmacological supportive care and advised follow-up with primary doctor if symptoms continue to persist Final Clinical Impressions(s) / UC Diagnoses   Final diagnoses:  Cellulitis of left leg     Discharge Instructions      Today you are evaluated for the pain that has started in the leg, on exam as the area is tender and the skin is warmer to touch in comparison with the right leg we will start you on antibiotics for infection  Take cephalexin  every 6 hours for 5 days  May apply ice over the affected area 10 to 15-minute intervals  May elevate whenever sitting and lying to help reduce swelling  May continue activity as tolerated  May take an extra dose of Tylenol  in the middle of the day if pain becomes severe  If your symptoms do not improve please follow-up with your primary doctor for reevaluation   ED Prescriptions     Medication Sig Dispense Auth. Provider   cephALEXin  (KEFLEX ) 500 MG capsule Take 1 capsule (500 mg total) by mouth 4 (four) times daily. 20 capsule Norvella Loscalzo R, NP  PDMP not reviewed this encounter.   Teresa Shelba SAUNDERS, [place] 03/19/24 1531

## 2024-03-19 NOTE — Discharge Instructions (Signed)
 Today you are evaluated for the pain that has started in the leg, on exam as the area is tender and the skin is warmer to touch in comparison with the right leg we will start you on antibiotics for infection  Take cephalexin  every 6 hours for 5 days  May apply ice over the affected area 10 to 15-minute intervals  May elevate whenever sitting and lying to help reduce swelling  May continue activity as tolerated  May take an extra dose of Tylenol  in the middle of the day if pain becomes severe  If your symptoms do not improve please follow-up with your primary doctor for reevaluation

## 2024-03-20 NOTE — Telephone Encounter (Signed)
 Pt was seen in UC on 03/19/2024.

## 2024-03-22 DIAGNOSIS — L03116 Cellulitis of left lower limb: Secondary | ICD-10-CM | POA: Diagnosis not present

## 2024-03-23 DIAGNOSIS — N3941 Urge incontinence: Secondary | ICD-10-CM | POA: Diagnosis not present

## 2024-03-23 DIAGNOSIS — H9190 Unspecified hearing loss, unspecified ear: Secondary | ICD-10-CM | POA: Diagnosis not present

## 2024-03-23 DIAGNOSIS — G629 Polyneuropathy, unspecified: Secondary | ICD-10-CM | POA: Diagnosis not present

## 2024-03-23 DIAGNOSIS — E039 Hypothyroidism, unspecified: Secondary | ICD-10-CM | POA: Diagnosis not present

## 2024-03-23 DIAGNOSIS — J45909 Unspecified asthma, uncomplicated: Secondary | ICD-10-CM | POA: Diagnosis not present

## 2024-03-23 DIAGNOSIS — Z96641 Presence of right artificial hip joint: Secondary | ICD-10-CM | POA: Diagnosis not present

## 2024-03-23 DIAGNOSIS — K219 Gastro-esophageal reflux disease without esophagitis: Secondary | ICD-10-CM | POA: Diagnosis not present

## 2024-03-23 DIAGNOSIS — G610 Guillain-Barre syndrome: Secondary | ICD-10-CM | POA: Diagnosis not present

## 2024-03-23 DIAGNOSIS — N3281 Overactive bladder: Secondary | ICD-10-CM | POA: Diagnosis not present

## 2024-03-23 DIAGNOSIS — Z96642 Presence of left artificial hip joint: Secondary | ICD-10-CM | POA: Diagnosis not present

## 2024-03-23 DIAGNOSIS — C569 Malignant neoplasm of unspecified ovary: Secondary | ICD-10-CM | POA: Diagnosis not present

## 2024-03-23 DIAGNOSIS — Z7982 Long term (current) use of aspirin: Secondary | ICD-10-CM | POA: Diagnosis not present

## 2024-03-23 DIAGNOSIS — Z471 Aftercare following joint replacement surgery: Secondary | ICD-10-CM | POA: Diagnosis not present

## 2024-03-23 DIAGNOSIS — M1990 Unspecified osteoarthritis, unspecified site: Secondary | ICD-10-CM | POA: Diagnosis not present

## 2024-03-23 DIAGNOSIS — I5032 Chronic diastolic (congestive) heart failure: Secondary | ICD-10-CM | POA: Diagnosis not present

## 2024-03-23 DIAGNOSIS — R252 Cramp and spasm: Secondary | ICD-10-CM | POA: Diagnosis not present

## 2024-03-23 DIAGNOSIS — Z9181 History of falling: Secondary | ICD-10-CM | POA: Diagnosis not present

## 2024-03-23 DIAGNOSIS — Z7951 Long term (current) use of inhaled steroids: Secondary | ICD-10-CM | POA: Diagnosis not present

## 2024-03-23 DIAGNOSIS — I890 Lymphedema, not elsewhere classified: Secondary | ICD-10-CM | POA: Diagnosis not present

## 2024-03-27 DIAGNOSIS — C569 Malignant neoplasm of unspecified ovary: Secondary | ICD-10-CM | POA: Diagnosis not present

## 2024-03-27 DIAGNOSIS — Z471 Aftercare following joint replacement surgery: Secondary | ICD-10-CM | POA: Diagnosis not present

## 2024-03-27 DIAGNOSIS — I5032 Chronic diastolic (congestive) heart failure: Secondary | ICD-10-CM | POA: Diagnosis not present

## 2024-03-27 DIAGNOSIS — G629 Polyneuropathy, unspecified: Secondary | ICD-10-CM | POA: Diagnosis not present

## 2024-03-27 DIAGNOSIS — I890 Lymphedema, not elsewhere classified: Secondary | ICD-10-CM | POA: Diagnosis not present

## 2024-03-27 DIAGNOSIS — Z96642 Presence of left artificial hip joint: Secondary | ICD-10-CM | POA: Diagnosis not present

## 2024-04-03 DIAGNOSIS — I5032 Chronic diastolic (congestive) heart failure: Secondary | ICD-10-CM | POA: Diagnosis not present

## 2024-04-03 DIAGNOSIS — Z96642 Presence of left artificial hip joint: Secondary | ICD-10-CM | POA: Diagnosis not present

## 2024-04-03 DIAGNOSIS — I890 Lymphedema, not elsewhere classified: Secondary | ICD-10-CM | POA: Diagnosis not present

## 2024-04-03 DIAGNOSIS — G629 Polyneuropathy, unspecified: Secondary | ICD-10-CM | POA: Diagnosis not present

## 2024-04-03 DIAGNOSIS — Z471 Aftercare following joint replacement surgery: Secondary | ICD-10-CM | POA: Diagnosis not present

## 2024-04-03 DIAGNOSIS — C569 Malignant neoplasm of unspecified ovary: Secondary | ICD-10-CM | POA: Diagnosis not present

## 2024-04-10 DIAGNOSIS — Z96641 Presence of right artificial hip joint: Secondary | ICD-10-CM | POA: Diagnosis not present

## 2024-04-10 DIAGNOSIS — Z96642 Presence of left artificial hip joint: Secondary | ICD-10-CM | POA: Diagnosis not present

## 2024-04-10 DIAGNOSIS — M25552 Pain in left hip: Secondary | ICD-10-CM | POA: Diagnosis not present

## 2024-04-10 DIAGNOSIS — R2689 Other abnormalities of gait and mobility: Secondary | ICD-10-CM | POA: Diagnosis not present

## 2024-04-10 DIAGNOSIS — R2681 Unsteadiness on feet: Secondary | ICD-10-CM | POA: Diagnosis not present

## 2024-04-11 ENCOUNTER — Telehealth: Payer: Self-pay

## 2024-04-11 NOTE — Telephone Encounter (Signed)
 Copied from CRM #[id number]. Topic: Clinical - Medical Advice >> Apr 11, 2024  3:24 PM Thersia BROCKS wrote: Reason for CRM: Patient called in regarding needing to get a cellulitis antibiotic because she is scheduled for a surgery and need to start that before she can have that surgery on 09/05 , would like for a nurse to give her a callback regarding this

## 2024-04-13 NOTE — Telephone Encounter (Signed)
 She is schedule to see you on 04/14/24.

## 2024-04-14 ENCOUNTER — Encounter: Payer: Self-pay | Admitting: Internal Medicine

## 2024-04-14 ENCOUNTER — Ambulatory Visit (INDEPENDENT_AMBULATORY_CARE_PROVIDER_SITE_OTHER): Admitting: Internal Medicine

## 2024-04-14 VITALS — BP 110/66 | HR 95 | Temp 97.5°F | Ht 66.0 in | Wt 186.6 lb

## 2024-04-14 DIAGNOSIS — L0390 Cellulitis, unspecified: Secondary | ICD-10-CM | POA: Insufficient documentation

## 2024-04-14 DIAGNOSIS — L03116 Cellulitis of left lower limb: Secondary | ICD-10-CM | POA: Diagnosis not present

## 2024-04-14 MED ORDER — AMOXICILLIN-POT CLAVULANATE 875-125 MG PO TABS: 1.0000 | ORAL_TABLET | Freq: Two times a day (BID) | ORAL | 0 refills | Status: DC

## 2024-04-14 NOTE — Assessment & Plan Note (Signed)
-   Patient was diagnosed with left lower extremity cellulitis approximately 1 month ago and has since completed 2 courses of antibiotics (Keflex  for 3 to 4 days followed by doxycycline for 10 days) -She states that the pain is improving but she still has significant pain at the site with erythema and increased local warmth -On exam, patient with left lower extremity erythema, tenderness and increased local warmth above her ankle extending to her mid shin.  No streaking noted -Patient with likely persistent left lower extremity cellulitis despite appropriate antibiotic prescription -Patient does have risk factors cellulitis with chronic lower extremity swelling bilaterally.  Lower extremities appear similar in size -We will treat the patient with Augmentin  to complete a 7-day course -Risks of Augmentin  including C. difficile as well as gastrointestinal upset were explained to the patient -If patient has no improvement on Augmentin  she would be considered failure of outpatient treatment and should follow-up in the hospital for possible IV antibiotics -No further workup at this time

## 2024-04-14 NOTE — Patient Instructions (Addendum)
-   It was a pleasure meeting you today -I suspect that you still have a persistent left leg infection called cellulitis.  You have completed 2 courses of antibiotics including Keflex  for 5 days and doxycycline for 10 days -It appears that the redness and pain has improved but still have some swelling, pain and redness at the site consistent with a mild infection -Will prescribe Augmentin  to complete a 7-day course.  Take 1 tablet twice a day with food -If the infection does not improve with this antibiotic you will need to be seen in the hospital for further evaluation as you may require IV antibiotics -Please contact us  with any questions or concerns or if you need any refills

## 2024-04-14 NOTE — Progress Notes (Signed)
 Acute Office Visit  Subjective:     Patient ID: Melissa Anderson, female    DOB: [birth year], 83 y.o.   MRN: [id number]  Chief Complaint  Patient presents with   Acute Visit    Patient saw Ortho and needs antibiotics for Cellulitis    HPI Patient is in today for follow-up on left lower extremity cellulitis.  Patient states that she has had symptoms of left lower extremity redness, pain and warmth for the last month.  She was initially seen in [facility] urgent care and was given Keflex  for 5-day course.  Patient states that it was not improving on the Keflex  after 3 days and went to another urgent care and was prescribed doxycycline for 10 days.  Patient states that she completed the course of doxycycline for 10 days and noted some improvement but still has pain over the area as well as redness and increased local warmth.  No fevers or chills.  She has chronic swelling in both lower extremities and denies any worsening swelling.    Review of Systems  Constitutional: Negative.   HENT: Negative.    Respiratory: Negative.    Cardiovascular: Negative.   Gastrointestinal: Negative.   Musculoskeletal: Negative.   Skin:        Left lower extremity pain, erythema and increased local warmth  Neurological: Negative.   Psychiatric/Behavioral: Negative.          Objective:    BP 110/66   Pulse 95   Temp (!) 97.5 F (36.4 C)   Ht 5' 6 (1.676 m)   Wt 186 lb 9.6 oz (84.6 kg)   SpO2 99%   BMI 30.12 kg/m    Physical Exam Constitutional:      Appearance: Normal appearance.  HENT:     Head: Normocephalic and atraumatic.  Cardiovascular:     Rate and Rhythm: Normal rate and regular rhythm.     Heart sounds: Normal heart sounds.  Pulmonary:     Effort: Pulmonary effort is normal.     Breath sounds: Normal breath sounds. No wheezing, rhonchi or rales.  Abdominal:     General: Bowel sounds are normal. There is no distension.     Palpations: Abdomen is soft.     Tenderness: There is  no abdominal tenderness. There is no guarding or rebound.  Musculoskeletal:        General: Swelling and tenderness present.     Comments: Bilateral lower extremity pitting edema noted.  They are similar in size  Skin:    Comments: Erythema noted over left lower extremity with mild tenderness to palpation and increased local warmth over the area.  No streaking noted  Neurological:     Mental Status: She is alert.  Psychiatric:        Mood and Affect: Mood normal.        Behavior: Behavior normal.     No results found for any visits on 04/14/24.      Assessment & Plan:   Problem List Items Addressed This Visit       Other   Cellulitis - Primary   - Patient was diagnosed with left lower extremity cellulitis approximately 1 month ago and has since completed 2 courses of antibiotics (Keflex  for 3 to 4 days followed by doxycycline for 10 days) -She states that the pain is improving but she still has significant pain at the site with erythema and increased local warmth -On exam, patient with left lower extremity erythema, tenderness and  increased local warmth above her ankle extending to her mid shin.  No streaking noted -Patient with likely persistent left lower extremity cellulitis despite appropriate antibiotic prescription -Patient does have risk factors cellulitis with chronic lower extremity swelling bilaterally.  Lower extremities appear similar in size -We will treat the patient with Augmentin  to complete a 7-day course -Risks of Augmentin  including C. difficile as well as gastrointestinal upset were explained to the patient -If patient has no improvement on Augmentin  she would be considered failure of outpatient treatment and should follow-up in the hospital for possible IV antibiotics -No further workup at this time      Relevant Medications   amoxicillin -clavulanate (AUGMENTIN ) 875-125 MG tablet    Meds ordered this encounter  Medications   amoxicillin -clavulanate  (AUGMENTIN ) 875-125 MG tablet    Sig: Take 1 tablet by mouth 2 (two) times daily.    Dispense:  14 tablet    Refill:  0    No follow-ups on file.  Elpidia Karn, MD

## 2024-04-15 DIAGNOSIS — L03116 Cellulitis of left lower limb: Secondary | ICD-10-CM | POA: Diagnosis not present

## 2024-04-15 DIAGNOSIS — Z01818 Encounter for other preprocedural examination: Secondary | ICD-10-CM | POA: Diagnosis not present

## 2024-04-15 DIAGNOSIS — N3941 Urge incontinence: Secondary | ICD-10-CM | POA: Diagnosis not present

## 2024-04-15 DIAGNOSIS — I5032 Chronic diastolic (congestive) heart failure: Secondary | ICD-10-CM | POA: Diagnosis not present

## 2024-04-15 DIAGNOSIS — Z8543 Personal history of malignant neoplasm of ovary: Secondary | ICD-10-CM | POA: Diagnosis not present

## 2024-04-15 DIAGNOSIS — E890 Postprocedural hypothyroidism: Secondary | ICD-10-CM | POA: Diagnosis not present

## 2024-04-15 DIAGNOSIS — Z8669 Personal history of other diseases of the nervous system and sense organs: Secondary | ICD-10-CM | POA: Diagnosis not present

## 2024-04-18 DIAGNOSIS — R2689 Other abnormalities of gait and mobility: Secondary | ICD-10-CM | POA: Diagnosis not present

## 2024-04-18 DIAGNOSIS — Z96642 Presence of left artificial hip joint: Secondary | ICD-10-CM | POA: Diagnosis not present

## 2024-04-18 DIAGNOSIS — Z96641 Presence of right artificial hip joint: Secondary | ICD-10-CM | POA: Diagnosis not present

## 2024-04-18 DIAGNOSIS — R2681 Unsteadiness on feet: Secondary | ICD-10-CM | POA: Diagnosis not present

## 2024-04-18 DIAGNOSIS — M25552 Pain in left hip: Secondary | ICD-10-CM | POA: Diagnosis not present

## 2024-04-22 DIAGNOSIS — Z96642 Presence of left artificial hip joint: Secondary | ICD-10-CM | POA: Diagnosis not present

## 2024-04-22 DIAGNOSIS — R2689 Other abnormalities of gait and mobility: Secondary | ICD-10-CM | POA: Diagnosis not present

## 2024-04-22 DIAGNOSIS — R2681 Unsteadiness on feet: Secondary | ICD-10-CM | POA: Diagnosis not present

## 2024-04-22 DIAGNOSIS — Z96641 Presence of right artificial hip joint: Secondary | ICD-10-CM | POA: Diagnosis not present

## 2024-04-22 DIAGNOSIS — M25552 Pain in left hip: Secondary | ICD-10-CM | POA: Diagnosis not present

## 2024-04-24 DIAGNOSIS — R7989 Other specified abnormal findings of blood chemistry: Secondary | ICD-10-CM | POA: Diagnosis not present

## 2024-04-24 DIAGNOSIS — R0602 Shortness of breath: Secondary | ICD-10-CM | POA: Diagnosis not present

## 2024-04-24 DIAGNOSIS — Z87891 Personal history of nicotine dependence: Secondary | ICD-10-CM | POA: Diagnosis not present

## 2024-04-24 DIAGNOSIS — R600 Localized edema: Secondary | ICD-10-CM | POA: Diagnosis not present

## 2024-04-24 DIAGNOSIS — M7989 Other specified soft tissue disorders: Secondary | ICD-10-CM | POA: Diagnosis not present

## 2024-04-24 DIAGNOSIS — L539 Erythematous condition, unspecified: Secondary | ICD-10-CM | POA: Diagnosis not present

## 2024-04-24 DIAGNOSIS — J9811 Atelectasis: Secondary | ICD-10-CM | POA: Diagnosis not present

## 2024-04-24 DIAGNOSIS — L03116 Cellulitis of left lower limb: Secondary | ICD-10-CM | POA: Diagnosis not present

## 2024-04-24 DIAGNOSIS — R5383 Other fatigue: Secondary | ICD-10-CM | POA: Diagnosis not present

## 2024-04-24 DIAGNOSIS — M793 Panniculitis, unspecified: Secondary | ICD-10-CM | POA: Diagnosis not present

## 2024-04-24 DIAGNOSIS — M79662 Pain in left lower leg: Secondary | ICD-10-CM | POA: Diagnosis not present

## 2024-04-24 DIAGNOSIS — R21 Rash and other nonspecific skin eruption: Secondary | ICD-10-CM | POA: Diagnosis not present

## 2024-04-24 DIAGNOSIS — I890 Lymphedema, not elsewhere classified: Secondary | ICD-10-CM | POA: Diagnosis not present

## 2024-04-25 DIAGNOSIS — L539 Erythematous condition, unspecified: Secondary | ICD-10-CM | POA: Diagnosis not present

## 2024-04-25 DIAGNOSIS — R21 Rash and other nonspecific skin eruption: Secondary | ICD-10-CM | POA: Diagnosis not present

## 2024-04-25 DIAGNOSIS — J9811 Atelectasis: Secondary | ICD-10-CM | POA: Diagnosis not present

## 2024-04-25 DIAGNOSIS — M7989 Other specified soft tissue disorders: Secondary | ICD-10-CM | POA: Diagnosis not present

## 2024-04-25 DIAGNOSIS — R600 Localized edema: Secondary | ICD-10-CM | POA: Diagnosis not present

## 2024-04-25 DIAGNOSIS — M793 Panniculitis, unspecified: Secondary | ICD-10-CM | POA: Diagnosis not present

## 2024-04-25 DIAGNOSIS — R7989 Other specified abnormal findings of blood chemistry: Secondary | ICD-10-CM | POA: Diagnosis not present

## 2024-04-25 DIAGNOSIS — M79662 Pain in left lower leg: Secondary | ICD-10-CM | POA: Diagnosis not present

## 2024-04-29 DIAGNOSIS — R2689 Other abnormalities of gait and mobility: Secondary | ICD-10-CM | POA: Diagnosis not present

## 2024-04-29 DIAGNOSIS — Z96641 Presence of right artificial hip joint: Secondary | ICD-10-CM | POA: Diagnosis not present

## 2024-04-29 DIAGNOSIS — Z96642 Presence of left artificial hip joint: Secondary | ICD-10-CM | POA: Diagnosis not present

## 2024-04-29 DIAGNOSIS — M25552 Pain in left hip: Secondary | ICD-10-CM | POA: Diagnosis not present

## 2024-04-29 DIAGNOSIS — R2681 Unsteadiness on feet: Secondary | ICD-10-CM | POA: Diagnosis not present

## 2024-05-01 DIAGNOSIS — Z96641 Presence of right artificial hip joint: Secondary | ICD-10-CM | POA: Diagnosis not present

## 2024-05-01 DIAGNOSIS — M25552 Pain in left hip: Secondary | ICD-10-CM | POA: Diagnosis not present

## 2024-05-01 DIAGNOSIS — R2681 Unsteadiness on feet: Secondary | ICD-10-CM | POA: Diagnosis not present

## 2024-05-01 DIAGNOSIS — Z96642 Presence of left artificial hip joint: Secondary | ICD-10-CM | POA: Diagnosis not present

## 2024-05-01 DIAGNOSIS — R2689 Other abnormalities of gait and mobility: Secondary | ICD-10-CM | POA: Diagnosis not present

## 2024-05-03 DIAGNOSIS — I700 Atherosclerosis of aorta: Secondary | ICD-10-CM | POA: Diagnosis not present

## 2024-05-03 DIAGNOSIS — Z9049 Acquired absence of other specified parts of digestive tract: Secondary | ICD-10-CM | POA: Diagnosis not present

## 2024-05-03 DIAGNOSIS — Z20822 Contact with and (suspected) exposure to covid-19: Secondary | ICD-10-CM | POA: Diagnosis not present

## 2024-05-03 DIAGNOSIS — R042 Hemoptysis: Secondary | ICD-10-CM | POA: Diagnosis not present

## 2024-05-03 DIAGNOSIS — R059 Cough, unspecified: Secondary | ICD-10-CM | POA: Diagnosis not present

## 2024-05-03 DIAGNOSIS — J189 Pneumonia, unspecified organism: Secondary | ICD-10-CM | POA: Diagnosis not present

## 2024-05-03 DIAGNOSIS — Z87891 Personal history of nicotine dependence: Secondary | ICD-10-CM | POA: Diagnosis not present

## 2024-05-03 DIAGNOSIS — R918 Other nonspecific abnormal finding of lung field: Secondary | ICD-10-CM | POA: Diagnosis not present

## 2024-05-05 DIAGNOSIS — I83893 Varicose veins of bilateral lower extremities with other complications: Secondary | ICD-10-CM | POA: Diagnosis not present

## 2024-05-05 DIAGNOSIS — R600 Localized edema: Secondary | ICD-10-CM | POA: Diagnosis not present

## 2024-05-05 DIAGNOSIS — I890 Lymphedema, not elsewhere classified: Secondary | ICD-10-CM | POA: Diagnosis not present

## 2024-05-09 DIAGNOSIS — N3281 Overactive bladder: Secondary | ICD-10-CM | POA: Diagnosis not present

## 2024-05-09 DIAGNOSIS — N3941 Urge incontinence: Secondary | ICD-10-CM | POA: Diagnosis not present

## 2024-05-12 ENCOUNTER — Ambulatory Visit: Admitting: Pulmonary Disease

## 2024-05-12 DIAGNOSIS — I872 Venous insufficiency (chronic) (peripheral): Secondary | ICD-10-CM | POA: Diagnosis not present

## 2024-05-14 ENCOUNTER — Encounter: Payer: Self-pay | Admitting: Internal Medicine

## 2024-05-14 ENCOUNTER — Ambulatory Visit (INDEPENDENT_AMBULATORY_CARE_PROVIDER_SITE_OTHER): Admitting: Internal Medicine

## 2024-05-14 VITALS — BP 124/80 | HR 77 | Temp 97.4°F | Ht 66.0 in | Wt 188.0 lb

## 2024-05-14 DIAGNOSIS — R600 Localized edema: Secondary | ICD-10-CM | POA: Diagnosis not present

## 2024-05-14 DIAGNOSIS — J189 Pneumonia, unspecified organism: Secondary | ICD-10-CM | POA: Diagnosis not present

## 2024-05-14 NOTE — Assessment & Plan Note (Signed)
-   Patient was seen in the ED at [facility] for hemoptysis and had a CT PE as well as a chest x-ray done which showed left lower lobe pneumonia -She has completed 10-day course of Augmentin  and a 7-day course of doxycycline -She states that her symptoms have now resolved -On exam, she does have some scattered bilateral expiratory wheezes but no rails -No further antibiotics required at this time -Patient will need repeat chest x-ray in 4 to 6 weeks to ensure resolution of her pneumonia -No further workup at this time

## 2024-05-14 NOTE — Assessment & Plan Note (Signed)
-   Patient has chronic bilateral lower extremity edema -She has recently seen vascular surgery and dermatology for this -They recommended Goldbond cream, steroid cream as well as compression therapy for lymphedema and leg elevation at night -Patient states that her redness and pain have resolved since I last saw her -She will continue to follow-up with dermatology and vascular surgery -No further workup at this time

## 2024-05-14 NOTE — Patient Instructions (Addendum)
-   It was a pleasure seeing you again today -It appears that the antibiotics have treated your pneumonia since you have no further symptoms.  At your next appointment Dr. Marylynn you can obtain an x-ray of your lungs to ensure resolution of the pneumonia -The lower extremity swelling appears to have improved.  Continue to follow with dermatology/vascular surgery to help with the lymphedema -Please contact us  with any questions or concerns or if you need any refills -Have a great birthday and trip to [place]!

## 2024-05-14 NOTE — Progress Notes (Signed)
 Acute Office Visit  Subjective:     Patient ID: Melissa Anderson, female    DOB: [birth year], 83 y.o.   MRN: [id number]  Chief Complaint  Patient presents with   Hospitalization Follow-up    HPI Patient is in today for follow-up of recent ED visit.  Patient was recently seen in the ED on September 6 for hemoptysis and was diagnosed with a left lower lobe pneumonia noted on CT and x-ray.  She completed a 10-day course of Augmentin  and a 7-day course of doxycycline.  She states that her hemoptysis has resolved and she has no respiratory symptoms currently.  The last time patient was seen by me on August 18 she did have evidence of left lower extremity cellulitis in addition to her lymphedema.  She completed her course of antibiotics and states that the redness and pain resolved.  She was seen by vascular surgery and dermatology for her lymphedema and advised to use Goldbond, steroid cream, compression therapy for her lymphedema and elevate her legs at night.  She denies any other complaints at this time and has follow-up with her PCP on November 6.  Review of Systems  Constitutional: Negative.   Respiratory: Negative.    Cardiovascular:  Positive for leg swelling.       Chronic bilateral lower extremity swelling secondary to lymphedema  Gastrointestinal: Negative.   Musculoskeletal: Negative.   Neurological: Negative.   Psychiatric/Behavioral: Negative.          Objective:    BP 124/80   Pulse 77   Temp (!) 97.4 F (36.3 C)   Ht 5' 6 (1.676 m)   Wt 188 lb (85.3 kg)   SpO2 96%   BMI 30.34 kg/m    Physical Exam Constitutional:      Appearance: Normal appearance.  HENT:     Head: Normocephalic and atraumatic.  Cardiovascular:     Rate and Rhythm: Normal rate and regular rhythm.     Heart sounds: Normal heart sounds.  Pulmonary:     Effort: Pulmonary effort is normal.     Breath sounds: Wheezing present. No rhonchi or rales.     Comments: Scattered expiratory  wheezes noted bilaterally Musculoskeletal:        General: Swelling present. No tenderness.     Right lower leg: Edema present.     Left lower leg: Edema present.     Comments: Patient has chronic bilateral lower extremity swelling secondary to lymphedema  Neurological:     Mental Status: She is alert.  Psychiatric:        Mood and Affect: Mood normal.        Behavior: Behavior normal.     No results found for any visits on 05/14/24.      Assessment & Plan:   Problem List Items Addressed This Visit       Respiratory   Pneumonia   - Patient was seen in the ED at [facility] for hemoptysis and had a CT PE as well as a chest x-ray done which showed left lower lobe pneumonia -She has completed 10-day course of Augmentin  and a 7-day course of doxycycline -She states that her symptoms have now resolved -On exam, she does have some scattered bilateral expiratory wheezes but no rails -No further antibiotics required at this time -Patient will need repeat chest x-ray in 4 to 6 weeks to ensure resolution of her pneumonia -No further workup at this time        Other  Bilateral leg edema - Primary   - Patient has chronic bilateral lower extremity edema -She has recently seen vascular surgery and dermatology for this -They recommended Goldbond cream, steroid cream as well as compression therapy for lymphedema and leg elevation at night -Patient states that her redness and pain have resolved since I last saw her -She will continue to follow-up with dermatology and vascular surgery -No further workup at this time       No orders of the defined types were placed in this encounter.   No follow-ups on file.  Dayona Shaheen, MD

## 2024-05-26 DIAGNOSIS — Z9229 Personal history of other drug therapy: Secondary | ICD-10-CM | POA: Diagnosis not present

## 2024-05-26 DIAGNOSIS — R8290 Unspecified abnormal findings in urine: Secondary | ICD-10-CM | POA: Diagnosis not present

## 2024-05-26 DIAGNOSIS — N3281 Overactive bladder: Secondary | ICD-10-CM | POA: Diagnosis not present

## 2024-05-30 ENCOUNTER — Ambulatory Visit: Admitting: Pulmonary Disease

## 2024-06-13 ENCOUNTER — Other Ambulatory Visit: Payer: Self-pay | Admitting: Internal Medicine

## 2024-06-18 ENCOUNTER — Ambulatory Visit: Payer: Self-pay

## 2024-06-18 NOTE — Telephone Encounter (Signed)
 FYI Only or Action Required?: Action required by provider: request for appointment.  Patient was last seen in primary care on 05/14/2024 by Onesimo Claude, MD.  Called Nurse Triage reporting Rash.  Symptoms began several weeks ago.  Interventions attempted: Prescription medications: completed atbx.  Symptoms are: unchanged.  Triage Disposition: See PCP Within 2 Weeks  Patient/caregiver understands and will follow disposition?: No, wishes to speak with PCP  Pt completed antibiotic while out of the country. She is still out of the country and requesting an appt the first week of November. Leg issue has been going on 2 years, patient is requesting doppler. PT not scheduled at this time. Prefers MD not NP or PA.    Copied from CRM #[id number]. Topic: Clinical - Red Word Triage >> Jun 18, 2024 10:40 AM Frederich PARAS wrote: [facility] that prompted transfer to Nurse Triage: possible blatter infection  Pt says she want a urinalysis, to make sure she doesnt have blatter infection. she was given a antiobiotic before she went out of country but she thinks she still have it. sometimes she feel the pressure, but she would like to be seen. on her  leg its red and hard its not the surface its underneeth, no pain, it has some temperature warm in.  , she says maybe a dopler test needs to be done to determin, she come back out of the country on 10/29 . Pt wanted to schedule an apt about these concerns but DT denied scheduling Reason for Disposition  [1] Localized area of skin darkening or thickening on lower legs or ankles AND [2] has NOT been evaluated by a doctor (or NP/PA)  All other urine symptoms  Answer Assessment - Initial Assessment Questions 1. APPEARANCE of RASH: What does the rash look like? (e.g., blisters, dry flaky skin, red spots, redness, sores)     Red, tight hard 2. LOCATION: Where is the rash located?      Left leg, calf, close to the ankle, 3. NUMBER: How many spots are there?       One area about 6 inches wraps around 4. SIZE: How big are the spots? (e.g., inches, cm; or compare to size of pinhead, tip of pen, eraser, pea)      About 6 inches  5. ONSET: When did the rash start?      About 2 years 6. ITCHING: Does the rash itch? If Yes, ask: How bad is the itch?  (Scale 0-10; or none, mild, moderate, severe)     no 7. PAIN: Does the rash hurt? If Yes, ask: How bad is the pain?  (Scale 0-10; or none, mild, moderate, severe)     no 8. OTHER SYMPTOMS: Do you have any other symptoms? (e.g., fever)     no  Answer Assessment - Initial Assessment Questions Pt is currently out of the country. Prior to leaving she had botox and they rand a urinalysis and said she had some bacteria in her urine and gave her an atbx. She states she is still having symptom of pressure in her bladder.      1. SYMPTOM: What's the main symptom you're concerned about? (e.g., frequency, incontinence)     Feels pressure in her bladder 2. ONSET: When did the  pressure  start?      3. PAIN: Is there any pain? If Yes, ask: How bad is it? (Scale: 1-10; mild, moderate, severe)     no 4. CAUSE: What do you think is causing the symptoms?  Uti or bladder infection 5. OTHER SYMPTOMS: Do you have any other symptoms? (e.g., blood in urine, fever, flank pain, pain with urination)     no  Protocols used: Rash or Redness - Localized-A-AH, Urinary Symptoms-A-AH

## 2024-06-26 ENCOUNTER — Encounter

## 2024-06-26 ENCOUNTER — Other Ambulatory Visit

## 2024-06-27 ENCOUNTER — Ambulatory Visit
Admission: RE | Admit: 2024-06-27 | Discharge: 2024-06-27 | Disposition: A | Discharge status: Home / Self Care | Source: Ambulatory Visit | Attending: Internal Medicine | Admitting: Internal Medicine

## 2024-06-27 DIAGNOSIS — N6001 Solitary cyst of right breast: Secondary | ICD-10-CM | POA: Insufficient documentation

## 2024-06-27 DIAGNOSIS — R928 Other abnormal and inconclusive findings on diagnostic imaging of breast: Secondary | ICD-10-CM

## 2024-07-03 ENCOUNTER — Ambulatory Visit: Admitting: Internal Medicine

## 2024-07-03 ENCOUNTER — Encounter: Payer: Self-pay | Admitting: Internal Medicine

## 2024-07-03 ENCOUNTER — Other Ambulatory Visit: Payer: Self-pay | Admitting: Internal Medicine

## 2024-07-03 VITALS — BP 120/70 | HR 92 | Ht 66.0 in | Wt 170.0 lb

## 2024-07-03 DIAGNOSIS — R7301 Impaired fasting glucose: Secondary | ICD-10-CM | POA: Diagnosis not present

## 2024-07-03 DIAGNOSIS — E782 Mixed hyperlipidemia: Secondary | ICD-10-CM

## 2024-07-03 DIAGNOSIS — E039 Hypothyroidism, unspecified: Secondary | ICD-10-CM

## 2024-07-03 DIAGNOSIS — R5383 Other fatigue: Secondary | ICD-10-CM | POA: Diagnosis not present

## 2024-07-03 DIAGNOSIS — N3281 Overactive bladder: Secondary | ICD-10-CM

## 2024-07-03 DIAGNOSIS — R944 Abnormal results of kidney function studies: Secondary | ICD-10-CM | POA: Diagnosis not present

## 2024-07-03 DIAGNOSIS — N182 Chronic kidney disease, stage 2 (mild): Secondary | ICD-10-CM | POA: Diagnosis not present

## 2024-07-03 DIAGNOSIS — I872 Venous insufficiency (chronic) (peripheral): Secondary | ICD-10-CM | POA: Diagnosis not present

## 2024-07-03 DIAGNOSIS — R350 Frequency of micturition: Secondary | ICD-10-CM

## 2024-07-03 DIAGNOSIS — M7989 Other specified soft tissue disorders: Secondary | ICD-10-CM

## 2024-07-03 LAB — LIPID PANEL
Cholesterol: 192 mg/dL (ref 0–200)
HDL: 43.7 mg/dL (ref >39.00)
LDL Cholesterol: 121 mg/dL — ABNORMAL HIGH (ref 0–99)
NonHDL: 148.08
Total CHOL/HDL Ratio: 4
Triglycerides: 135 mg/dL (ref 0.0–149.0)
VLDL: 27 mg/dL (ref 0.0–40.0)

## 2024-07-03 LAB — CBC WITH DIFFERENTIAL/PLATELET
Basophils Absolute: 0.1 K/uL (ref 0.0–0.1)
Basophils Relative: 0.9 % (ref 0.0–3.0)
Eosinophils Absolute: 0.1 K/uL (ref 0.0–0.7)
Eosinophils Relative: 1.9 % (ref 0.0–5.0)
HCT: 42 % (ref 36.0–46.0)
Hemoglobin: 13.6 g/dL (ref 12.0–15.0)
Lymphocytes Relative: 18.4 % (ref 12.0–46.0)
Lymphs Abs: 1.1 K/uL (ref 0.7–4.0)
MCHC: 32.5 g/dL (ref 30.0–36.0)
MCV: 93.7 fl (ref 78.0–100.0)
Monocytes Absolute: 0.7 K/uL (ref 0.1–1.0)
Monocytes Relative: 11.9 % (ref 3.0–12.0)
Neutro Abs: 3.9 K/uL (ref 1.4–7.7)
Neutrophils Relative %: 66.9 % (ref 43.0–77.0)
Platelets: 291 K/uL (ref 150.0–400.0)
RBC: 4.48 Mil/uL (ref 3.87–5.11)
RDW: 14.3 % (ref 11.5–15.5)
WBC: 5.9 K/uL (ref 4.0–10.5)

## 2024-07-03 LAB — COMPREHENSIVE METABOLIC PANEL WITH GFR
ALT: 15 U/L (ref 0–35)
AST: 20 U/L (ref 0–37)
Albumin: 4.1 g/dL (ref 3.5–5.2)
Alkaline Phosphatase: 81 U/L (ref 39–117)
BUN: 27 mg/dL — ABNORMAL HIGH (ref 6–23)
CO2: 31 meq/L (ref 19–32)
Calcium: 9.5 mg/dL (ref 8.4–10.5)
Chloride: 103 meq/L (ref 96–112)
Creatinine, Ser: 1 mg/dL (ref 0.40–1.20)
GFR: 51.88 mL/min — ABNORMAL LOW (ref >60.00)
Glucose, Bld: 87 mg/dL (ref 70–99)
Potassium: 5.2 meq/L — ABNORMAL HIGH (ref 3.5–5.1)
Sodium: 143 meq/L (ref 135–145)
Total Bilirubin: 0.6 mg/dL (ref 0.2–1.2)
Total Protein: 6.4 g/dL (ref 6.0–8.3)

## 2024-07-03 LAB — LDL CHOLESTEROL, DIRECT: Direct LDL: 125 mg/dL

## 2024-07-03 LAB — TSH: TSH: 0.14 u[IU]/mL — ABNORMAL LOW (ref 0.35–5.50)

## 2024-07-03 LAB — HEMOGLOBIN A1C: Hgb A1c MFr Bld: 5.3 % (ref 4.6–6.5)

## 2024-07-03 MED ORDER — TRIAMCINOLONE ACETONIDE 0.1 % EX OINT: TOPICAL_OINTMENT | Freq: Two times a day (BID) | CUTANEOUS | 0 refills | Status: DC | PRN

## 2024-07-03 NOTE — Patient Instructions (Signed)
 You have  been diagnosed with lymphedema and venous insufficiency in the past.  These diagnoses are not curable,  they have to Be MANAGED.    I have ordered an ultrasound to rule out a blood clot.  If there is no blood clot,  you need to resume use of compression garments and increase your pumping to 2 times daily to manage

## 2024-07-03 NOTE — Assessment & Plan Note (Signed)
 Now receivng Botox injections by [facility]  ast one in September.  Resultant UTI reviewed

## 2024-07-03 NOTE — Assessment & Plan Note (Signed)
 Thyroid  function is WNL on current dose of 150 mcg Synthroid  daily. No current changes needed.    Lab Results  Component Value Date   TSH 4.79 12/20/2023

## 2024-07-03 NOTE — Assessment & Plan Note (Addendum)
 She has no history of  proteinuria or  hypertension   Lab Results  Component Value Date   CREATININE 0.69 12/20/2023     Lab Results  Component Value Date   LABMICR See below: 11/08/2021   LABMICR See below: 05/10/2021   MICROALBUR <0.7 12/20/2023

## 2024-07-03 NOTE — Progress Notes (Signed)
 Subjective:  Patient ID: Melissa Anderson, female    DOB: 07-Aug-1941  Age: 83 y.o. MRN: [id number]  CC: The primary encounter diagnosis was Venous stasis dermatitis of left lower extremity. 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   HPI Melissa Anderson presents for  Chief Complaint  Patient presents with   Medical Management of Chronic Issues    6 month follow up    1) Abn mammogram:  Melissa Anderson recently completed a6 month follow up mammo/ultrasound of right breast shows benign cluster of cysts.  Return to annual screening advised  2) persistent redness and swelling of LLE has been going on for 2 years. She was treated for cellulitis of LLE  3 times in the past several months , last time in mid August by Dr Melissa Anderson with no signicant change.  She has had several family members and physical therapists express concern about a DVT  despite the chronicity of the swelling and the previous diagnosis of lymphedema and venous stasis noted during my review of her last vascular surgery OV note.  She uses lymphedema pumps once daily in the evenigns and is wearing compression garment on the right leg but was advised by her sister to avoid compressing the left leg.   3) chronic low back pain: getting a spinal cord stimulator placed in February   4) while vacationing in [place]  last month,  she decided to have her urine checked  at a local retail lab because her recent [facility] treated her or UTI (50-100K  E Coli) with macrobid x 7 days on Oct 1  which she was still taking when she flew to [place].  per protocol for  botox injection  done sept 12 for OAB  she gets serial UA   he r only  symptoms was suprapubic fullness/pressure .  Symptom of  suprapubic pressure AND urinary hesitancy have improved but she decided to check anyway . The UA and culture from [place] was positive for leuks and nitrites and culture grew an undetermined  quantity of E COli    Outpatient Medications Prior to Visit  Medication Sig Dispense Refill   aspirin EC 81 MG tablet Take 81 mg by mouth daily. Swallow whole.     cyanocobalamin  (VITAMIN B12) 500 MCG tablet Take 500 mcg by mouth daily.     finasteride (PROSCAR) 5 MG tablet Take 5 mg by mouth daily.     Fluticasone-Umeclidin-Vilant (TRELEGY ELLIPTA ) 100-62.5-25 MCG/ACT AEPB Inhale 1 puff into the lungs daily. 28 each 0   furosemide  (LASIX ) 20 MG tablet Take 1 tablet (20 mg total) by mouth daily. 90 tablet 3   mometasone (ELOCON) 0.1 % ointment Apply topically daily.     pantoprazole  (PROTONIX ) 40 MG tablet TAKE 1 TABLET BY MOUTH EVERY DAY 90 tablet 1   SYNTHROID  150 MCG tablet TAKE 1 TABLET (150 MCG TOTAL) BY MOUTH DAILY BEFORE BREAKFAST. 30 MIN BEFORE FOOD. ON SUNDAYS TAKE 1.5 TABLETS STOP 175 MCG 96 tablet 5   TRELEGY ELLIPTA  100-62.5-25 MCG/ACT AEPB INHALE 1 PUFF BY MOUTH EVERY DAY 60 each 11   Vibegron  (GEMTESA ) 75 MG TABS Take 1 tablet (75 mg total) by mouth daily. 30 tablet 0   No facility-administered medications prior to visit.    Review of Systems;  Patient  denies headache, fevers, malaise, unintentional weight loss, skin rash, eye pain, sinus congestion and sinus pain, sore throat, dysphagia,  hemoptysis , cough, dyspnea, wheezing, chest pain, palpitations, orthopnea, edema, abdominal pain, nausea, melena, diarrhea, constipation, flank pain, dysuria, hematuria, urinary  Frequency, nocturia, numbness, tingling, seizures,  Focal weakness, Loss of consciousness,  Tremor, insomnia, depression, anxiety, and suicidal ideation.      Objective:  BP 120/70   Pulse 92   Ht 5' 6 (1.676 m)   Wt 170 lb (77.1 kg)   SpO2 95%   BMI 27.44 kg/m   BP Readings from Last 3 Encounters:  07/03/24 120/70   05/14/24 124/80  04/14/24 110/66    Wt Readings from Last 3 Encounters:  07/03/24 170 lb (77.1 kg)  05/14/24 188 lb (85.3 kg)  04/14/24 186 lb 9.6 oz (84.6 kg)    Physical Exam Vitals reviewed.  Constitutional:      General: She is not in acute distress.    Appearance: Normal appearance. She is normal weight. She is not ill-appearing, toxic-appearing or diaphoretic.  HENT:     Head: Normocephalic.  Eyes:     General: No scleral icterus.       Right eye: No discharge.        Left eye: No discharge.     Conjunctiva/sclera: Conjunctivae normal.  Cardiovascular:     Rate and Rhythm: Normal rate and regular rhythm.     Heart sounds: Normal heart sounds.  Pulmonary:     Effort: Pulmonary effort is normal. No respiratory distress.     Breath sounds: Normal breath sounds.  Musculoskeletal:        General: Normal range of motion.  Skin:    General: Skin is warm and dry.     Findings: Erythema and rash present. Rash is macular.         Comments: Uniform erythematous band with brawny skin changes   Neurological:     General: No focal deficit present.     Mental Status: She is alert and oriented to person, place, and time. Mental status is at baseline.  Psychiatric:        Mood and Affect: Mood normal.        Behavior: Behavior normal.        Thought Content: Thought content normal.        Judgment: Judgment normal.     Lab Results  Component Value Date   HGBA1C 5.3 05/09/2018    Lab Results  Component Value Date   CREATININE 0.69 12/20/2023   CREATININE 0.88 08/06/2023   CREATININE 0.73 02/20/2023    Lab Results  Component Value Date   WBC 7.8 08/06/2023   HGB 12.1 08/06/2023   HCT 37.2 08/06/2023   PLT 335.0 08/06/2023   GLUCOSE 89 12/20/2023   CHOL 198 12/20/2023   TRIG 91.0 12/20/2023   HDL 64.50 12/20/2023   LDLDIRECT 124.0 12/20/2023   LDLCALC 115 (H) 12/20/2023   ALT 22 12/20/2023   AST 23 12/20/2023   NA 140 12/20/2023   K 4.4 12/20/2023   CL  106 12/20/2023   CREATININE 0.69 12/20/2023   BUN 24 (H) 12/20/2023   CO2 27 12/20/2023   TSH 4.79 12/20/2023   HGBA1C 5.3 05/09/2018   MICROALBUR <0.7 12/20/2023    MM 3D DIAGNOSTIC MAMMOGRAM UNILATERAL RIGHT BREAST Addendum Date: 06/27/2024 ADDENDUM REPORT: 06/27/2024 14:35 RECOMMENDATION: Patient may return to routine screening mammogram in March 2026.(Code:SM-B-01Y). (Note that the original report incorrectly recommended routine screening in  1 year. ) I have discussed the findings and recommendations with the patient. If applicable, a reminder letter will be sent to the patient regarding the next appointment. BI-RADS CATEGORY  2: Benign. Electronically Signed   By: Norleen Croak M.D.   On: 06/27/2024 14:35   Result Date: 06/27/2024 CLINICAL DATA:  Six-month follow-up of probably benign, minimally complicated clustered cysts in the RIGHT breast 9-930 position, correlating with a mass on mammography. EXAM: DIGITAL DIAGNOSTIC UNILATERAL RIGHT MAMMOGRAM WITH TOMOSYNTHESIS AND CAD; ULTRASOUND RIGHT BREAST LIMITED TECHNIQUE: Right digital diagnostic mammography and breast tomosynthesis was performed. The images were evaluated with computer-aided detection. ; Targeted ultrasound examination of the right breast was performed COMPARISON:  Previous exam(s). ACR Breast Density Category b: There are scattered areas of fibroglandular density. FINDINGS: Stable appearance of the previously described RIGHT breast mass. No new suspicious findings in the RIGHT breast. Ultrasound was performed. Targeted ultrasound of the RIGHT breast at the 9:30 position 3 cm from the nipple demonstrates a benign cluster of simple cysts measuring 10 x 3 x 7 mm, previously 10 x 4 x 7 mm. These masses are anechoic and circumscribed with through transmission, compatible with a benign etiology. IMPRESSION: Clustered cysts in the RIGHT breast at the 9:30 position today have a definitively benign appearance and do not warrant further  imaging follow-up. There is no mammographic or sonographic evidence of malignancy. The patient may return to routine screening in 1 year. RECOMMENDATION: Patient may return to routine screening mammogram in 1 year.(Code:SM-B-01Y) I have discussed the findings and recommendations with the patient. If applicable, a reminder letter will be sent to the patient regarding the next appointment. BI-RADS CATEGORY  2: Benign. Electronically Signed: By: Norleen Croak M.D. On: 06/27/2024 12:02   US  LIMITED ULTRASOUND INCLUDING AXILLA RIGHT BREAST Addendum Date: 06/27/2024 ADDENDUM REPORT: 06/27/2024 14:35 RECOMMENDATION: Patient may return to routine screening mammogram in March 2026.(Code:SM-B-01Y). (Note that the original report incorrectly recommended routine screening in 1 year. ) I have discussed the findings and recommendations with the patient. If applicable, a reminder letter will be sent to the patient regarding the next appointment. BI-RADS CATEGORY  2: Benign. Electronically Signed   By: Norleen Croak M.D.   On: 06/27/2024 14:35   Result Date: 06/27/2024 CLINICAL DATA:  Six-month follow-up of probably benign, minimally complicated clustered cysts in the RIGHT breast 9-930 position, correlating with a mass on mammography. EXAM: DIGITAL DIAGNOSTIC UNILATERAL RIGHT MAMMOGRAM WITH TOMOSYNTHESIS AND CAD; ULTRASOUND RIGHT BREAST LIMITED TECHNIQUE: Right digital diagnostic mammography and breast tomosynthesis was performed. The images were evaluated with computer-aided detection. ; Targeted ultrasound examination of the right breast was performed COMPARISON:  Previous exam(s). ACR Breast Density Category b: There are scattered areas of fibroglandular density. FINDINGS: Stable appearance of the previously described RIGHT breast mass. No new suspicious findings in the RIGHT breast. Ultrasound was performed. Targeted ultrasound of the RIGHT breast at the 9:30 position 3 cm from the nipple demonstrates a benign cluster of  simple cysts measuring 10 x 3 x 7 mm, previously 10 x 4 x 7 mm. These masses are anechoic and circumscribed with through transmission, compatible with a benign etiology. IMPRESSION: Clustered cysts in the RIGHT breast at the 9:30 position today have a definitively benign appearance and do not warrant further imaging follow-up. There is no mammographic or sonographic evidence of malignancy. The patient may return to routine screening in 1 year. RECOMMENDATION: Patient may return to routine screening mammogram in 1 year.(Code:SM-B-01Y) I have discussed the findings  and recommendations with the patient. If applicable, a reminder letter will be sent to the patient regarding the next appointment. BI-RADS CATEGORY  2: Benign. Electronically Signed: By: Norleen Croak M.D. On: 06/27/2024 12:02    Assessment & Plan:  .Venous stasis dermatitis of left lower extremity Assessment & Plan: Suggested by failure to resolve with empiric abx for cellulitis x 3 . However given her recent travel to [place], need to rule ouf DVT .  STAT venous doppler ordered . 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 No current changes needed.    Lab Results  Component Value Date   TSH 4.79 12/20/2023     Orders: -     TSH  Decreased GFR -     Comprehensive metabolic panel with GFR  Impaired fasting glucose -     Comprehensive metabolic panel with GFR -     Hemoglobin A1c  Other fatigue -     CBC with Differential/Platelet  Urinary frequency -     Urinalysis, Routine w reflex microscopic -     Urine Culture  Swelling of limb -     VAS US  LOWER EXTREMITY VENOUS (DVT); Future  CKD (chronic kidney disease) stage 2, GFR 60-89 ml/min Assessment & Plan: She has no  history of  proteinuria or  hypertension   Lab Results  Component Value Date   CREATININE 0.69 12/20/2023     Lab Results  Component Value Date   LABMICR See below: 11/08/2021   LABMICR See below: 05/10/2021   MICROALBUR <0.7 12/20/2023        Overactive bladder Assessment & Plan: Now receivng Botox injections by [facility]  ast one in September.  Resultant UTI reviewed    Other orders -     triamcinolone  0.1% oint-Eucerin equivalent cream 1:1 mixture; Apply topically 2 (two) times daily as needed. To left lower extremity  Dispense: 450 g; Refill: 0   I personally spent a total of 45 minutes in the care of the patient today including getting/reviewing separately obtained history, performing a medically appropriate exam/evaluation, counseling and educating, placing orders, documenting clinical information in the EHR, independently interpreting results, communicating results, and coordinating care.    Follow-up: Return in about 6 months (around 12/31/2024).   Melissa LITTIE Kettering, MD

## 2024-07-03 NOTE — Addendum Note (Signed)
 Addended by: MARYLYNN VERNEITA CROME on: 07/03/2024 02:28 PM   Modules accepted: Orders

## 2024-07-03 NOTE — Assessment & Plan Note (Addendum)
 Suggested by failure to resolve with empiric abx for cellulitis x 3 . However given her recent travel to [place], need to rule ouf DVT .  STAT venous doppler ordered . If negative will recommend use of compression garments and TAC eucerin

## 2024-07-04 ENCOUNTER — Ambulatory Visit: Payer: Self-pay | Admitting: Internal Medicine

## 2024-07-04 DIAGNOSIS — E039 Hypothyroidism, unspecified: Secondary | ICD-10-CM

## 2024-07-04 LAB — URINALYSIS, ROUTINE W REFLEX MICROSCOPIC
Bilirubin Urine: NEGATIVE
Hgb urine dipstick: NEGATIVE
Ketones, ur: NEGATIVE
Nitrite: POSITIVE — AB
Specific Gravity, Urine: 1.01 (ref 1.000–1.030)
Total Protein, Urine: NEGATIVE
Urine Glucose: NEGATIVE
Urobilinogen, UA: 0.2 (ref 0.0–1.0)
pH: 5.5 (ref 5.0–8.0)

## 2024-07-04 MED ORDER — LEVOTHYROXINE SODIUM 125 MCG PO TABS: 125.0000 ug | ORAL_TABLET | Freq: Every day | ORAL | 0 refills | Status: AC

## 2024-07-04 NOTE — Addendum Note (Signed)
 Addended by: MARYLYNN VERNEITA CROME on: 07/04/2024 07:55 AM   Modules accepted: Orders

## 2024-07-04 NOTE — Assessment & Plan Note (Signed)
 Thyroid  function is overactive on current dose. Of 150 mcg daily x 7 days.   Lowering dose to 125   Lab Results  Component Value Date   TSH 0.14 (L) 07/03/2024

## 2024-07-06 LAB — URINE CULTURE
MICRO NUMBER:: 17200736
SPECIMEN QUALITY:: ADEQUATE

## 2024-07-07 ENCOUNTER — Other Ambulatory Visit (INDEPENDENT_AMBULATORY_CARE_PROVIDER_SITE_OTHER)

## 2024-07-07 DIAGNOSIS — R944 Abnormal results of kidney function studies: Secondary | ICD-10-CM | POA: Diagnosis not present

## 2024-07-07 NOTE — Telephone Encounter (Signed)
 Attempted to call pt.  [facility] unable to fill medication.  We need to send to a compounding pharmacy so that the medication can be mixed.  The closest one is [facility] in [place].  Need to see if patient is agreeable.

## 2024-07-07 NOTE — Telephone Encounter (Unsigned)
 Copied from CRM [id number]. Topic: General - Other >> Jul 07, 2024  4:16 PM Avram MATSU wrote: Reason for CRM: patient stated she eats a lot of leafy greens that are high potassium. She was told to not eat bananas but wanted to let the provider know it could be the veggies she's eating. Such as a half a beat everyday and the leaves, kale and etc. Please advise [phone] (M)

## 2024-07-08 ENCOUNTER — Ambulatory Visit
Admission: RE | Admit: 2024-07-08 | Discharge: 2024-07-08 | Disposition: A | Discharge status: Home / Self Care | Source: Ambulatory Visit | Attending: Internal Medicine | Admitting: Internal Medicine

## 2024-07-08 DIAGNOSIS — M7989 Other specified soft tissue disorders: Secondary | ICD-10-CM | POA: Insufficient documentation

## 2024-07-08 DIAGNOSIS — M7122 Synovial cyst of popliteal space [Baker], left knee: Secondary | ICD-10-CM | POA: Diagnosis not present

## 2024-07-08 LAB — BASIC METABOLIC PANEL WITH GFR
BUN: 22 mg/dL (ref 6–23)
CO2: 28 meq/L (ref 19–32)
Calcium: 9.3 mg/dL (ref 8.4–10.5)
Chloride: 107 meq/L (ref 96–112)
Creatinine, Ser: 0.86 mg/dL (ref 0.40–1.20)
GFR: 62.17 mL/min (ref >60.00)
Glucose, Bld: 73 mg/dL (ref 70–99)
Potassium: 4.6 meq/L (ref 3.5–5.1)
Sodium: 142 meq/L (ref 135–145)

## 2024-07-09 ENCOUNTER — Encounter: Payer: Self-pay | Admitting: Internal Medicine

## 2024-07-11 DIAGNOSIS — N3941 Urge incontinence: Secondary | ICD-10-CM | POA: Diagnosis not present

## 2024-07-11 DIAGNOSIS — N3281 Overactive bladder: Secondary | ICD-10-CM | POA: Diagnosis not present

## 2024-07-29 ENCOUNTER — Telehealth: Payer: Self-pay

## 2024-07-29 MED ORDER — TRIAMCINOLONE ACETONIDE 0.1 % EX OINT: TOPICAL_OINTMENT | Freq: Two times a day (BID) | CUTANEOUS | 0 refills | Status: AC | PRN

## 2024-07-29 NOTE — Telephone Encounter (Unsigned)
 Copied from CRM #[id number]. Topic: Clinical - Prescription Issue >> Jul 29, 2024 11:11 AM Thersia BROCKS wrote: Reason for CRM: [facility]  triamcinolone  0.1% oint-Eucerin equivalent cream 1:1 mixture    doesnt have the concentration percentage , either 0.5 or 0.1   Can call or send new rx - if call ask for autumn  [phone]

## 2024-07-29 NOTE — Telephone Encounter (Signed)
 Rx has been sent to [facility]

## 2024-08-01 ENCOUNTER — Telehealth: Payer: Self-pay

## 2024-08-01 NOTE — Telephone Encounter (Unsigned)
 Copied from CRM #[id number]. Topic: Clinical - Prescription Issue >> Aug 01, 2024 11:02 AM Nessti S wrote: Reason for CRM: pt called in because no one has called her about prescription compound. She would like a call back soon as possible [phone] (M)

## 2024-08-01 NOTE — Telephone Encounter (Signed)
 LMTCB. Need to let pt know that I spoke with [facility] and gave them the information that they needed. They should be calling her today to let her know how much the medication will cost.

## 2024-08-01 NOTE — Telephone Encounter (Signed)
 0.1% was given to the pharmacy

## 2024-08-04 NOTE — Telephone Encounter (Signed)
 Spoke with pt and she stated that she has not heard from [facility] still. I spoke with the pharmacy and they stated that they called her and texted her. I called the pt back and gave her the number to the pharmacy so she could reach out to them per the pharmacy's request.

## 2024-08-05 NOTE — Telephone Encounter (Signed)
 NA

## 2024-09-17 ENCOUNTER — Telehealth: Payer: Self-pay

## 2024-09-17 NOTE — Telephone Encounter (Signed)
 Spoke with pt to let her know that I do not see any documentation of someone from our office calling her today. Pt gave a verbal understanding.

## 2024-09-17 NOTE — Telephone Encounter (Signed)
 Copied from CRM #[id number]. Topic: General - Call Back - No Documentation >> Sep 17, 2024  4:02 PM Alexandria E wrote: Reason for CRM: Patient stated she missed a call today from the office, agent could not locate what this was in regards too. Please reach back out to patient if needed.

## 2024-09-19 ENCOUNTER — Encounter: Payer: Self-pay | Admitting: Internal Medicine

## 2024-09-19 ENCOUNTER — Telehealth: Payer: Self-pay

## 2024-09-19 NOTE — Telephone Encounter (Signed)
 Copied from CRM #[id number]. Topic: Clinical - Medication Question >> Sep 19, 2024  1:12 PM Deleta RAMAN wrote: Reason for CRM: patient would like to know if she can take mucinex or not due to other medications taking. Please contact patient

## 2024-09-19 NOTE — Telephone Encounter (Signed)
 Patient notified and made aware of Dr Lula recommendations. Patient verbalized understanding and has no further questions at this time.

## 2024-09-24 ENCOUNTER — Ambulatory Visit: Admitting: Internal Medicine

## 2024-09-24 ENCOUNTER — Telehealth: Admitting: Physician Assistant

## 2024-09-24 ENCOUNTER — Ambulatory Visit: Payer: Self-pay

## 2024-09-24 DIAGNOSIS — J329 Chronic sinusitis, unspecified: Secondary | ICD-10-CM

## 2024-09-24 DIAGNOSIS — J40 Bronchitis, not specified as acute or chronic: Secondary | ICD-10-CM

## 2024-09-24 MED ORDER — DOXYCYCLINE HYCLATE 100 MG PO TABS: 100.0000 mg | ORAL_TABLET | Freq: Two times a day (BID) | ORAL | 0 refills | Status: AC

## 2024-09-24 NOTE — Telephone Encounter (Signed)
 Pt was seen today virtual and prescribed doxycycline .

## 2024-09-24 NOTE — Progress Notes (Signed)
 " Virtual Visit Consent   Melissa Anderson, you are scheduled for a virtual visit with a Media provider today. Just as with appointments in the office, your consent must be obtained to participate. Your consent will be active for this visit and any virtual visit you may have with one of our providers in the next 365 days. If you have a MyChart account, a copy of this consent can be sent to you electronically.  As this is a virtual visit, video technology does not allow for your provider to perform a traditional examination. This may limit your provider's ability to fully assess your condition. If your provider identifies any concerns that need to be evaluated in person or the need to arrange testing (such as labs, EKG, etc.), we will make arrangements to do so. Although advances in technology are sophisticated, we cannot ensure that it will always work on either your end or our end. If the connection with a video visit is poor, the visit may have to be switched to a telephone visit. With either a video or telephone visit, we are not always able to ensure that we have a secure connection.  By engaging in this virtual visit, you consent to the provision of healthcare and authorize for your insurance to be billed (if applicable) for the services provided during this visit. Depending on your insurance coverage, you may receive a charge related to this service.  I need to obtain your verbal consent now. Are you willing to proceed with your visit today? Melissa Anderson has provided verbal consent on 09/24/2024 for a virtual visit (video or telephone). Melissa Anderson, [place]  Date: 09/24/2024 3:24 PM   Virtual Visit via Video Note   I, Melissa Anderson, connected with  Melissa Anderson  ([id number], [birth year]) on 09/24/24 at  3:30 PM EST by a video-enabled telemedicine application and verified that I am speaking with the correct person using two identifiers.  Location: Patient: Virtual Visit  Location Patient: Home Provider: Virtual Visit Location Provider: Home Office   I discussed the limitations of evaluation and management by telemedicine and the availability of in person appointments. The patient expressed understanding and agreed to proceed.    History of Present Illness: Melissa Anderson is a 84 y.o. who identifies as a female who was assigned female at birth, and is being seen today for possible sinusitis. Endorses symptoms starting about 3 weeks ago, initially with nasal congestion, rhinorrhea, post-nasal drip and mild cough. Tested for COVID and flu at that time which were negative. This persisted and over time has progressed to continued nasal congestion, sinus pressure, sinus pain with continued rhinorrhea. Notes continued cough that is better today but had been quite persistent. Notes fatigue and increased sleep to try and recover. Chills initially that have resolved. Denies fever.  OTC -- Nothing. Daughter picking her up some Mucinex DM per PCP recommendations.  HPI: HPI  Problems:  Patient Active Problem List   Diagnosis Date Noted   Venous stasis dermatitis of left lower extremity 07/03/2024   Pneumonia 05/14/2024   Arthropathy of left hip 03/03/2024   CKD (chronic kidney disease) stage 2, GFR 60-89 ml/min 12/20/2023   Postoperative anemia due to acute blood loss 08/06/2023   Hospital discharge follow-up 08/06/2023   Obesity (BMI 30.0-34.9) 01/04/2023   Myalgia due to statin 12/14/2022   Hiatal hernia 12/14/2022   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/14/2022   Dyspnea on exertion 12/31/2021   Chronic  diastolic heart failure (HCC) 12/31/2021   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   Lymphedema 09/28/2020   Abnormal gait 09/28/2020   Arthritis, 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  Resting comfortably at home.  Head is normocephalic, atraumatic.  No labored breathing. Speech is clear and coherent with logical content.  Patient is alert and oriented at baseline.   Assessment and Plan: 1. Sinobronchitis (Primary) - doxycycline  (VIBRA -TABS) 100 MG tablet; Take 1 tablet (100 mg total) by mouth 2 (two) times daily.  Dispense: 14 tablet; Refill: 0  Rx Doxycycline .  Increase fluids.  Rest.  Saline nasal spray.  Probiotic.  Mucinex-DM as directed.  Humidifier in bedroom.  Call or return to clinic if symptoms are not improving.   Follow Up Instructions: I discussed the assessment and treatment plan with the patient. The patient was provided an opportunity to ask questions and all were answered. The patient agreed with the plan and demonstrated an understanding of the instructions.  A copy of instructions were sent to the patient via MyChart unless otherwise noted below.   The patient was advised to call back or seek an in-person evaluation if the symptoms worsen or if the condition fails to improve as anticipated.    Melissa Velma Lunger, PA-C    [1]  Allergies Allergen Reactions   Novocain [Procaine] Palpitations   Other Other (See Comments)    Supposedly allergic to Shingles and Pneumonia vaccine.   Celebrex  [Celecoxib ] Other (See Comments)    Decreased gfr   Crestor  [Rosuvastatin ]     Myopathy     Influenza Vaccines     ?GBS 05/2018    Lipitor [Atorvastatin Calcium ]     Myalgias    [2]  Current Outpatient Medications:    doxycycline  (VIBRA -TABS) 100 MG tablet, Take 1  tablet (100 mg total) by mouth 2 (two) times daily., Disp: 14 tablet, Rfl: 0   aspirin EC 81 MG tablet, Take 81 mg by mouth daily. Swallow whole., Disp: , Rfl:    cyanocobalamin  (VITAMIN B12) 500 MCG tablet, Take 500 mcg by mouth daily., Disp: , Rfl:    finasteride (PROSCAR) 5 MG tablet, Take 5 mg by mouth daily., Disp: , Rfl:    Fluticasone-Umeclidin-Vilant (TRELEGY ELLIPTA ) 100-62.5-25 MCG/ACT AEPB, Inhale 1 puff into the lungs daily., Disp: 28 each, Rfl: 0   furosemide  (LASIX ) 20 MG tablet, Take 1 tablet (20 mg total) by mouth daily., Disp: 90 tablet, Rfl: 3   levothyroxine  (SYNTHROID ) 125 MCG tablet, Take 1 tablet (125 mcg total) by mouth daily before breakfast., Disp: 90 tablet, Rfl: 0   mometasone (ELOCON) 0.1 % ointment, Apply topically daily., Disp: , Rfl:    pantoprazole  (PROTONIX ) 40 MG tablet, TAKE 1 TABLET BY MOUTH EVERY DAY, Disp: 90 tablet, Rfl: 1   TRELEGY ELLIPTA  100-62.5-25 MCG/ACT AEPB, INHALE 1 PUFF BY MOUTH EVERY DAY, Disp: 60 each, Rfl: 11   triamcinolone  0.1% oint-Eucerin equivalent cream 1:1 mixture, Apply topically 2 (two) times daily as needed. To left lower extremity, Disp: 450 g, Rfl: 0   Vibegron  (GEMTESA )  75 MG TABS, Take 1 tablet (75 mg total) by mouth daily., Disp: 30 tablet, Rfl: 0  "

## 2024-09-24 NOTE — Telephone Encounter (Signed)
 FYI Only or Action Required?: FYI only for provider: appointment scheduled on virtual UC for 09/24/2024.  Patient was last seen in primary care on 07/03/2024 by Marylynn Verneita CROME, MD.  Called Nurse Triage reporting Nasal Congestion and Cough.  Symptoms began several weeks ago.  Interventions attempted: Rest, hydration, or home remedies.  Symptoms are: gradually worsening.  Triage Disposition: Home Care  Patient/caregiver understands and will follow disposition?: No, wishes to speak with PCP  Message from [facility] C sent at 09/24/2024 10:33 AM EST  Reason for Triage: Patient is sleeping 14 hours a day, persistent cough, runny nose states she has a head cold, no appetite, would like a virtual appt   Reason for Disposition  Common cold with no complications  Answer Assessment - Initial Assessment Questions Symptoms began during recent vacation to [place] . Has tested negative for home flu and covid  1. ONSET: When did the nasal discharge start?      About three weeks ago  3. COUGH: Do you have a cough? If Yes, ask: Describe the color of your mucus. (e.g., clear, white, yellow, green)     Non productive cough 4. RESPIRATORY DISTRESS: Describe your breathing.      denies 5. FEVER: Do you have a fever? If Yes, ask: What is your temperature, how was it measured, and when did it start?     Chills, but no measured fever 6. SEVERITY: Overall, how bad are you feeling right now? (e.g., doesn't interfere with normal activities, staying home from school/work, staying in bed)      Patient is spending most of the day resting in bed 7. OTHER SYMPTOMS: Do you have any other symptoms? (e.g., earache, mouth sores, sore throat, wheezing)  Tired/excessive sleep  Protocols used: Common Cold-A-AH

## 2024-09-24 NOTE — Telephone Encounter (Signed)
 FYI has telehealth appt on 09/24/24

## 2024-09-24 NOTE — Patient Instructions (Signed)
 " Melissa Anderson, thank you for joining Melissa Velma Lunger, PA-C for today's virtual visit.  While this provider is not your primary care provider (PCP), if your PCP is located in our provider database this encounter information will be shared with them immediately following your visit.   A [facility] MyChart account gives you access to today's visit and all your visits, tests, and labs performed at [facility]  click here if you don't have a [facility] MyChart account or go to [email or web address]  Consent: (Patient) Melissa Anderson provided verbal consent for this virtual visit at the beginning of the encounter.  Current Medications:  Current Outpatient Medications:    doxycycline  (VIBRA -TABS) 100 MG tablet, Take 1 tablet (100 mg total) by mouth 2 (two) times daily., Disp: 14 tablet, Rfl: 0   aspirin EC 81 MG tablet, Take 81 mg by mouth daily. Swallow whole., Disp: , Rfl:    cyanocobalamin  (VITAMIN B12) 500 MCG tablet, Take 500 mcg by mouth daily., Disp: , Rfl:    finasteride (PROSCAR) 5 MG tablet, Take 5 mg by mouth daily., Disp: , Rfl:    Fluticasone-Umeclidin-Vilant (TRELEGY ELLIPTA ) 100-62.5-25 MCG/ACT AEPB, Inhale 1 puff into the lungs daily., Disp: 28 each, Rfl: 0   furosemide  (LASIX ) 20 MG tablet, Take 1 tablet (20 mg total) by mouth daily., Disp: 90 tablet, Rfl: 3   levothyroxine  (SYNTHROID ) 125 MCG tablet, Take 1 tablet (125 mcg total) by mouth daily before breakfast., Disp: 90 tablet, Rfl: 0   mometasone (ELOCON) 0.1 % ointment, Apply topically daily., Disp: , Rfl:    pantoprazole  (PROTONIX ) 40 MG tablet, TAKE 1 TABLET BY MOUTH EVERY DAY, Disp: 90 tablet, Rfl: 1   TRELEGY ELLIPTA  100-62.5-25 MCG/ACT AEPB, INHALE 1 PUFF BY MOUTH EVERY DAY, Disp: 60 each, Rfl: 11   triamcinolone  0.1% oint-Eucerin equivalent cream 1:1 mixture, Apply topically 2 (two) times daily as needed. To left lower extremity, Disp: 450 g, Rfl: 0   Vibegron  (GEMTESA ) 75 MG TABS, Take 1  tablet (75 mg total) by mouth daily., Disp: 30 tablet, Rfl: 0   Medications ordered in this encounter:  Meds ordered this encounter  Medications   doxycycline  (VIBRA -TABS) 100 MG tablet    Sig: Take 1 tablet (100 mg total) by mouth 2 (two) times daily.    Dispense:  14 tablet    Refill:  0    Supervising Provider:   BLAISE ALEENE KIDD [[id number]]     *If you need refills on other medications prior to your next appointment, please contact your pharmacy*  Follow-Up: Call back or seek an in-person evaluation if the symptoms worsen or if the condition fails to improve as anticipated.  [facility] [phone]  Other Instructions Please take antibiotic as directed.  Increase fluid intake.  Use Saline nasal spray.  Take a daily multivitamin. Ok to use Mucinex-DM over-the-counter as recommended by Dr. Marylynn.  Place a humidifier in the bedroom.  If you note any non-resolving, new, or worsening symptoms despite treatment, please seek an in-person evaluation ASAP.   If you have been instructed to have an in-person evaluation today at a local Urgent Care facility, please use the link below. It will take you to a list of all of our available [facility], including address, phone number and hours of operation. Please do not delay care.  [facility]  If you or a family member do not have a primary care provider, use the link below  to schedule a visit and establish care. When you choose a [facility] primary care physician or advanced practice provider, you gain a long-term partner in health. Find a Primary Care Provider  Learn more about [facility] in-office and virtual care options: [facility] - Get Care Now  "

## 2024-10-08 ENCOUNTER — Ambulatory Visit: Payer: Medicare Other

## 2024-10-08 ENCOUNTER — Other Ambulatory Visit: Payer: Medicare Other

## 2024-10-16 ENCOUNTER — Ambulatory Visit: Admitting: Pulmonary Disease
